# Patient Record
Sex: Female | Born: 1945 | Race: Black or African American | Hispanic: No | Marital: Married | Smoking: Former smoker
Health system: Southern US, Community
[De-identification: ages and names within clinical notes are randomized; demographics above are authoritative.]

## PROBLEM LIST (undated history)

## (undated) DIAGNOSIS — R0600 Dyspnea, unspecified: Secondary | ICD-10-CM

## (undated) DIAGNOSIS — M545 Low back pain, unspecified: Secondary | ICD-10-CM

## (undated) DIAGNOSIS — E119 Type 2 diabetes mellitus without complications: Secondary | ICD-10-CM

## (undated) DIAGNOSIS — Z8489 Family history of other specified conditions: Secondary | ICD-10-CM

## (undated) DIAGNOSIS — M1990 Unspecified osteoarthritis, unspecified site: Secondary | ICD-10-CM

## (undated) DIAGNOSIS — E785 Hyperlipidemia, unspecified: Secondary | ICD-10-CM

## (undated) DIAGNOSIS — I10 Essential (primary) hypertension: Secondary | ICD-10-CM

## (undated) DIAGNOSIS — M169 Osteoarthritis of hip, unspecified: Secondary | ICD-10-CM

## (undated) DIAGNOSIS — D219 Benign neoplasm of connective and other soft tissue, unspecified: Secondary | ICD-10-CM

## (undated) DIAGNOSIS — G8929 Other chronic pain: Secondary | ICD-10-CM

## (undated) DIAGNOSIS — K219 Gastro-esophageal reflux disease without esophagitis: Secondary | ICD-10-CM

## (undated) DIAGNOSIS — Z8619 Personal history of other infectious and parasitic diseases: Secondary | ICD-10-CM

## (undated) DIAGNOSIS — I499 Cardiac arrhythmia, unspecified: Secondary | ICD-10-CM

## (undated) DIAGNOSIS — F419 Anxiety disorder, unspecified: Secondary | ICD-10-CM

## (undated) DIAGNOSIS — Z973 Presence of spectacles and contact lenses: Secondary | ICD-10-CM

## (undated) DIAGNOSIS — M5410 Radiculopathy, site unspecified: Secondary | ICD-10-CM

## (undated) HISTORY — PX: INCISION AND DRAINAGE: SHX5863

## (undated) HISTORY — PX: COLONOSCOPY: SHX174

## (undated) HISTORY — DX: Personal history of other infectious and parasitic diseases: Z86.19

## (undated) HISTORY — PX: POSTERIOR CERVICAL LAMINECTOMY: SHX2248

## (undated) HISTORY — DX: Hyperlipidemia, unspecified: E78.5

## (undated) HISTORY — DX: Gastro-esophageal reflux disease without esophagitis: K21.9

## (undated) HISTORY — PX: BACK SURGERY: SHX140

## (undated) HISTORY — PX: TONSILLECTOMY: SUR1361

## (undated) HISTORY — PX: TUBAL LIGATION: SHX77

## (undated) HISTORY — PX: JOINT REPLACEMENT: SHX530

## (undated) HISTORY — DX: Benign neoplasm of connective and other soft tissue, unspecified: D21.9

## (undated) HISTORY — DX: Essential (primary) hypertension: I10

---

## 1968-11-23 HISTORY — PX: LAPAROSCOPIC OVARIAN CYSTECTOMY: SUR786

## 1986-11-23 HISTORY — PX: CRYOABLATION: SHX1415

## 1996-11-23 HISTORY — PX: CARDIAC CATHETERIZATION: SHX172

## 1996-11-23 HISTORY — PX: TOTAL ABDOMINAL HYSTERECTOMY: SHX209

## 1998-12-16 ENCOUNTER — Other Ambulatory Visit: Admission: RE | Admit: 1998-12-16 | Discharge: 1998-12-16 | Payer: Self-pay | Admitting: Obstetrics and Gynecology

## 2001-04-20 ENCOUNTER — Ambulatory Visit (HOSPITAL_COMMUNITY): Admission: RE | Admit: 2001-04-20 | Discharge: 2001-04-20 | Payer: Self-pay | Admitting: Internal Medicine

## 2001-04-20 ENCOUNTER — Encounter: Payer: Self-pay | Admitting: Internal Medicine

## 2001-08-03 ENCOUNTER — Other Ambulatory Visit: Admission: RE | Admit: 2001-08-03 | Discharge: 2001-08-03 | Payer: Self-pay | Admitting: Obstetrics and Gynecology

## 2003-02-01 ENCOUNTER — Other Ambulatory Visit: Admission: RE | Admit: 2003-02-01 | Discharge: 2003-02-01 | Payer: Self-pay | Admitting: Obstetrics and Gynecology

## 2003-02-22 HISTORY — PX: SHOULDER OPEN ROTATOR CUFF REPAIR: SHX2407

## 2003-02-27 ENCOUNTER — Ambulatory Visit (HOSPITAL_COMMUNITY): Admission: RE | Admit: 2003-02-27 | Discharge: 2003-02-27 | Payer: Self-pay | Admitting: Internal Medicine

## 2003-02-27 ENCOUNTER — Encounter: Payer: Self-pay | Admitting: Internal Medicine

## 2003-08-08 ENCOUNTER — Ambulatory Visit (HOSPITAL_COMMUNITY): Admission: RE | Admit: 2003-08-08 | Discharge: 2003-08-08 | Payer: Self-pay | Admitting: Internal Medicine

## 2003-08-08 ENCOUNTER — Encounter: Payer: Self-pay | Admitting: Internal Medicine

## 2003-10-17 ENCOUNTER — Ambulatory Visit (HOSPITAL_COMMUNITY): Admission: RE | Admit: 2003-10-17 | Discharge: 2003-10-17 | Payer: Self-pay | Admitting: Orthopaedic Surgery

## 2003-10-17 ENCOUNTER — Emergency Department (HOSPITAL_COMMUNITY): Admission: EM | Admit: 2003-10-17 | Discharge: 2003-10-17 | Payer: Self-pay | Admitting: *Deleted

## 2003-10-21 ENCOUNTER — Inpatient Hospital Stay (HOSPITAL_COMMUNITY): Admission: EM | Admit: 2003-10-21 | Discharge: 2003-10-30 | Payer: Self-pay | Admitting: Emergency Medicine

## 2003-10-24 ENCOUNTER — Encounter: Payer: Self-pay | Admitting: Cardiology

## 2004-02-14 ENCOUNTER — Other Ambulatory Visit: Admission: RE | Admit: 2004-02-14 | Discharge: 2004-02-14 | Payer: Self-pay | Admitting: Obstetrics and Gynecology

## 2005-02-17 ENCOUNTER — Other Ambulatory Visit: Admission: RE | Admit: 2005-02-17 | Discharge: 2005-02-17 | Payer: Self-pay | Admitting: Obstetrics and Gynecology

## 2005-08-06 ENCOUNTER — Ambulatory Visit: Payer: Self-pay | Admitting: Internal Medicine

## 2005-08-20 ENCOUNTER — Encounter (INDEPENDENT_AMBULATORY_CARE_PROVIDER_SITE_OTHER): Payer: Self-pay | Admitting: *Deleted

## 2005-08-20 ENCOUNTER — Ambulatory Visit: Payer: Self-pay | Admitting: Internal Medicine

## 2005-08-24 ENCOUNTER — Encounter: Admission: RE | Admit: 2005-08-24 | Discharge: 2005-11-22 | Payer: Self-pay | Admitting: Internal Medicine

## 2005-11-06 ENCOUNTER — Ambulatory Visit (HOSPITAL_COMMUNITY): Admission: RE | Admit: 2005-11-06 | Discharge: 2005-11-06 | Payer: Self-pay | Admitting: Internal Medicine

## 2006-02-19 ENCOUNTER — Other Ambulatory Visit: Admission: RE | Admit: 2006-02-19 | Discharge: 2006-02-19 | Payer: Self-pay | Admitting: Obstetrics and Gynecology

## 2006-05-20 ENCOUNTER — Emergency Department (HOSPITAL_COMMUNITY): Admission: EM | Admit: 2006-05-20 | Discharge: 2006-05-20 | Payer: Self-pay | Admitting: Emergency Medicine

## 2007-02-07 ENCOUNTER — Ambulatory Visit (HOSPITAL_COMMUNITY): Admission: RE | Admit: 2007-02-07 | Discharge: 2007-02-07 | Payer: Self-pay | Admitting: Internal Medicine

## 2007-02-21 ENCOUNTER — Other Ambulatory Visit: Admission: RE | Admit: 2007-02-21 | Discharge: 2007-02-21 | Payer: Self-pay | Admitting: Obstetrics and Gynecology

## 2008-03-05 ENCOUNTER — Other Ambulatory Visit: Admission: RE | Admit: 2008-03-05 | Discharge: 2008-03-05 | Payer: Self-pay | Admitting: Obstetrics and Gynecology

## 2008-11-23 HISTORY — PX: THUMB FUSION: SUR636

## 2009-03-06 ENCOUNTER — Ambulatory Visit: Payer: Self-pay | Admitting: Obstetrics and Gynecology

## 2009-03-06 ENCOUNTER — Other Ambulatory Visit: Admission: RE | Admit: 2009-03-06 | Discharge: 2009-03-06 | Payer: Self-pay | Admitting: Obstetrics and Gynecology

## 2009-03-06 ENCOUNTER — Encounter: Payer: Self-pay | Admitting: Obstetrics and Gynecology

## 2009-08-20 ENCOUNTER — Ambulatory Visit (HOSPITAL_BASED_OUTPATIENT_CLINIC_OR_DEPARTMENT_OTHER): Admission: RE | Admit: 2009-08-20 | Discharge: 2009-08-20 | Payer: Self-pay | Admitting: Orthopedic Surgery

## 2010-03-11 ENCOUNTER — Other Ambulatory Visit: Admission: RE | Admit: 2010-03-11 | Discharge: 2010-03-11 | Payer: Self-pay | Admitting: Obstetrics and Gynecology

## 2010-03-11 ENCOUNTER — Ambulatory Visit: Payer: Self-pay | Admitting: Obstetrics and Gynecology

## 2010-07-18 ENCOUNTER — Encounter: Payer: Self-pay | Admitting: Internal Medicine

## 2010-08-12 ENCOUNTER — Encounter (INDEPENDENT_AMBULATORY_CARE_PROVIDER_SITE_OTHER): Payer: Self-pay | Admitting: *Deleted

## 2010-09-12 ENCOUNTER — Encounter (INDEPENDENT_AMBULATORY_CARE_PROVIDER_SITE_OTHER): Payer: Self-pay | Admitting: *Deleted

## 2010-09-16 ENCOUNTER — Ambulatory Visit: Payer: Self-pay | Admitting: Internal Medicine

## 2010-09-16 ENCOUNTER — Encounter (INDEPENDENT_AMBULATORY_CARE_PROVIDER_SITE_OTHER): Payer: Self-pay | Admitting: *Deleted

## 2010-09-30 ENCOUNTER — Ambulatory Visit: Payer: Self-pay | Admitting: Internal Medicine

## 2010-10-02 ENCOUNTER — Encounter: Payer: Self-pay | Admitting: Internal Medicine

## 2010-12-25 NOTE — Letter (Signed)
Summary: Patient Notice- Polyp Results  [facility]  [address] [place], [place] [address]   Phone: [phone]  Fax: [phone]        October 02, 2010 MRN: [id number]    [name] [address] [place], [place]  [address]    Dear [name],  I am pleased to inform you that the colon polyp(s) removed during your recent colonoscopy was (were) found to be benign (no cancer detected) upon pathologic examination.  I recommend you have a repeat colonoscopy examination in 5 years to look for recurrent polyps, as having colon polyps increases your risk for having recurrent polyps or even colon cancer in the future.  Should you develop new or worsening symptoms of abdominal pain, bowel habit changes or bleeding from the rectum or bowels, please schedule an evaluation with either your primary care physician or with me.  Additional information/recommendations:  __ No further action with gastroenterology is needed at this time. Please      follow-up with your primary care physician for your other healthcare      needs.   Please call us if you are having persistent problems or have questions about your condition that have not been fully answered at this time.  Sincerely,  Hilarie Fredrickson MD  This letter has been electronically signed by your physician.  Appended Document: Patient Notice- Polyp Results letter mailed 11.15.2011

## 2010-12-25 NOTE — Letter (Signed)
Summary: Colonoscopy Letter  [facility]  [address] [place], [place] [address]   Phone: [phone]  Fax: [phone]      July 18, 2010 MRN: [id number]   [name] [address] [place], [place]  [address]   Dear [name],   According to your medical record, it is time for you to schedule a Colonoscopy. The American Cancer Society recommends this procedure as a method to detect early colon cancer. Patients with a family history of colon cancer, or a personal history of colon polyps or inflammatory bowel disease are at increased risk.  This letter has been generated based on the recommendations made at the time of your procedure. If you feel that in your particular situation this may no longer apply, please contact our office.  Please call our office at [phone] to schedule this appointment or to update your records at your earliest convenience.  Thank you for cooperating with [place] to provide you with the very best care possible.   Sincerely,  Wilhemina Bonito. Marina Goodell, M.D.  [facility] Gastroenterology Division [phone]

## 2010-12-25 NOTE — Miscellaneous (Signed)
Summary: [facility] Previsit/prep  Clinical Lists Changes  Medications: Added new medication of MOVIPREP 100 GM  SOLR (PEG-KCL-NACL-NASULF-NA ASC-C) As per prep instructions. - Signed Rx of MOVIPREP 100 GM  SOLR (PEG-KCL-NACL-NASULF-NA ASC-C) As per prep instructions.;  #1 x 0;  Signed;  Entered by: Wyona Almas RN;  Authorized by: Hilarie Fredrickson MD;  Method used: Electronically to [id number] [address] [place], [place]  [address], Ph: [phone], Fax: [phone] Allergies: Added new allergy or adverse reaction of PREDNISONE Observations: Added new observation of NKA: F (09/16/2010 8:01)    Prescriptions: MOVIPREP 100 GM  SOLR (PEG-KCL-NACL-NASULF-NA ASC-C) As per prep instructions.  #1 x 0   Entered by:   Wyona Almas RN   Authorized by:   Hilarie Fredrickson MD   Signed by:   Wyona Almas RN on 09/16/2010   Method used:   Electronically to        [facility] [id number] (retail)       [address] [place], [place]  [address]       Ph: [phone]       Fax: [phone]   RxID:   [id number]

## 2010-12-25 NOTE — Letter (Signed)
Summary: Diabetic Instructions  [facility]  [address] [place], [place] [address]   Phone: [phone]  Fax: [phone]    [name] 06/17/1946 MRN: [id number]   _x  _   ORAL DIABETIC MEDICATION INSTRUCTIONS  The day before your procedure:   Take your diabetic pill as you do normally  The day of your procedure:   Do not take your diabetic pill    We will check your blood sugar levels during the admission process and again in Recovery before discharging you home

## 2010-12-25 NOTE — Letter (Signed)
Summary: Pre Visit Letter Revised  [facility]  [address] [place], [place] [address]   Phone: [phone]  Fax: [phone]        08/12/2010 MRN: [id number] [name] [address] [place], [place]  [address]             Procedure Date:  09/30/2010   Welcome to the Gastroenterology Division at [facility].    You are scheduled to see a nurse for your pre-procedure visit on 09/16/2010 at 8:00AM on the 3rd floor at [facility], [address]  We ask that you try to arrive at our office 15 minutes prior to your appointment time to allow for check-in.  Please take a minute to review the attached form.  If you answer "Yes" to one or more of the questions on the first page, we ask that you call the person listed at your earliest opportunity.  If you answer "No" to all of the questions, please complete the rest of the form and bring it to your appointment.    Your nurse visit will consist of discussing your medical and surgical history, your immediate family medical history, and your medications.   If you are unable to list all of your medications on the form, please bring the medication bottles to your appointment and we will list them.  We will need to be aware of both prescribed and over the counter drugs.  We will need to know exact dosage information as well.    Please be prepared to read and sign documents such as consent forms, a financial agreement, and acknowledgement forms.  If necessary, and with your consent, a friend or relative is welcome to sit-in on the nurse visit with you.  Please bring your insurance card so that we may make a copy of it.  If your insurance requires a referral to see a specialist, please bring your referral form from your primary care physician.  No co-pay is required for this nurse visit.     If you cannot keep your appointment, please call [phone] to cancel or reschedule prior to your appointment date.  This  allows [place] the opportunity to schedule an appointment for another patient in need of care.    Thank you for choosing [facility] for your medical needs.  We appreciate the opportunity to care for you.  Please visit [place] at our website  to learn more about our practice.  Sincerely, The Gastroenterology Division

## 2010-12-25 NOTE — Procedures (Signed)
Summary: Colonoscopy  Patient: [name] Note: All result statuses are Final unless otherwise noted.  Tests: (1) Colonoscopy (COL)   COL Colonoscopy           DONE     [facility]     [address]     [place], [place]  [address]           COLONOSCOPY PROCEDURE REPORT           PATIENT:  [name], [name]  MR#:  [id number]     BIRTHDATE:  [birth year], 64 yrs. old  GENDER:  female     ENDOSCOPIST:  Wilhemina Bonito. Eda Keys, MD     REF. BY:  Surveillance Program Recall,     PROCEDURE DATE:  09/30/2010     PROCEDURE:  Colonoscopy with snare polypectomy x 5     ASA CLASS:  Class II     INDICATIONS:  history of pre-cancerous (adenomatous) colon polyps,     surveillance and high-risk screening ; index exam 07-2005 w/ small     TAs     MEDICATIONS:   Fentanyl 100 mcg IV, Versed 9 mg IV           DESCRIPTION OF PROCEDURE:   After the risks benefits and     alternatives of the procedure were thoroughly explained, informed     consent was obtained.  Digital rectal exam was performed and     revealed no abnormalities.   The LB CF-[id number] endoscope     was introduced through the anus and advanced to the cecum, which     was identified by both the appendix and ileocecal valve, without     limitations.Time to cecum = 1:58 min.  The quality of the prep was     excellent, using MoviPrep.  The instrument was then slowly     withdrawn (time = 21:14 min) as the colon was fully examined.     <<PROCEDUREIMAGES>>           FINDINGS:  There were five polyps identified and removed. All     measured <44mm and were located in the ascending colon (3),     descending colon and sigmoid colon. Polyps were snared without     cautery. Retrieval was successful in 4/5. This was otherwise a     normal examination of the colon.   Retroflexed views in the rectum     revealed no abnormalities.    The scope was then withdrawn from     the patient and the procedure completed.           COMPLICATIONS:  None           ENDOSCOPIC IMPRESSION:     1) Polyps, multiple small - removed     2) Otherwise normal examination           RECOMMENDATIONS:     1) Follow up colonoscopy in 5 years           ______________________________     Wilhemina Bonito. Eda Keys, MD           CC:  Thayer Headings, MD;  The Patient           n.     eSIGNED:   Wilhemina Bonito. Eda Keys at 09/30/2010 08:51 AM           Felecia Shelling, [id number]  Note: An exclamation mark (!) indicates a result that was not dispersed into the flowsheet. Document  Creation Date: 09/30/2010 8:51 AM _______________________________________________________________________  (1) Order result status: Final Collection or observation date-time: 09/30/2010 08:43 Requested date-time:  Receipt date-time:  Reported date-time:  Referring Physician:   Ordering Physician: Fransico Setters [phone]) Specimen Source:  Source: Launa Grill Order Number: [id number] Lab site:   Appended Document: Colonoscopy recall 5 yrs     Procedures Next Due Date:    Colonoscopy: 09/2015

## 2010-12-25 NOTE — Letter (Signed)
Summary: [facility] Instructions  [facility]  [address] [place], [place] [address]   Phone: [phone]  Fax: [phone]       [name]    63-Dec-1947    MRN: [id number]        Procedure Day /Date:   09/30/10 Tuesday     Arrival Time:  7:30am     Procedure Time: 8:00am     Location of Procedure:                    _ x_  [facility] (4th Floor)                        PREPARATION FOR COLONOSCOPY WITH MOVIPREP   Starting 5 days prior to your procedure _ 11/3/11_ do not eat nuts, seeds, popcorn, corn, beans, peas,  salads, or any raw vegetables.  Do not take any fiber supplements (e.g. Metamucil, Citrucel, and Benefiber).  THE DAY BEFORE YOUR PROCEDURE         DATE:  09/29/10   DAY:  Monday  1.  Drink clear liquids the entire day-NO SOLID FOOD  2.  Do not drink anything colored red or purple.  Avoid juices with pulp.  No orange juice.  3.  Drink at least 64 oz. (8 glasses) of fluid/clear liquids during the day to prevent dehydration and help the prep work efficiently.  CLEAR LIQUIDS INCLUDE: Water Jello Ice Popsicles Tea (sugar ok, no milk/cream) Powdered fruit flavored drinks Coffee (sugar ok, no milk/cream) Gatorade Juice: apple, white grape, white cranberry  Lemonade Clear bullion, consomm, broth Carbonated beverages (any kind) Strained chicken noodle soup Hard Candy                             4.  In the morning, mix first dose of MoviPrep solution:    Empty 1 Pouch A and 1 Pouch B into the disposable container    Add lukewarm drinking water to the top line of the container. Mix to dissolve    Refrigerate (mixed solution should be used within 24 hrs)  5.  Begin drinking the prep at 5:00 p.m. The MoviPrep container is divided by 4 marks.   Every 15 minutes drink the solution down to the next mark (approximately 8 oz) until the full liter is complete.   6.  Follow completed prep with 16 oz of clear liquid of your choice  (Nothing red or purple).  Continue to drink clear liquids until bedtime.  7.  Before going to bed, mix second dose of MoviPrep solution:    Empty 1 Pouch A and 1 Pouch B into the disposable container    Add lukewarm drinking water to the top line of the container. Mix to dissolve    Refrigerate  THE DAY OF YOUR PROCEDURE      DATE:   09/30/10  DAY:  Tuesday  Beginning at  3:00 a.m. (5 hours before procedure):         1. Every 15 minutes, drink the solution down to the next mark (approx 8 oz) until the full liter is complete.  2. Follow completed prep with 16 oz. of clear liquid of your choice.    3. You may drink clear liquids until 6:00am   (2 HOURS BEFORE PROCEDURE).   MEDICATION INSTRUCTIONS  Unless otherwise instructed, you should take regular prescription medications with a small sip  of water   as early as possible the morning of your procedure.  Diabetic patients - see separate instructions.        OTHER INSTRUCTIONS  You will need a responsible adult at least 63 years of age to accompany you and drive you home.   This person must remain in the waiting room during your procedure.  Wear loose fitting clothing that is easily removed.  Leave jewelry and other valuables at home.  However, you may wish to bring a book to read or  an iPod/MP3 player to listen to music as you wait for your procedure to start.  Remove all body piercing jewelry and leave at home.  Total time from sign-in until discharge is approximately 2-3 hours.  You should go home directly after your procedure and rest.  You can resume normal activities the  day after your procedure.  The day of your procedure you should not:   Drive   Make legal decisions   Operate machinery   Drink alcohol   Return to work  You will receive specific instructions about eating, activities and medications before you leave.    The above instructions have been reviewed and explained to me by   Wyona Almas RN  September 16, 2010 8:27 AM     I fully understand and can verbalize these instructions _____________________________ Date _________

## 2011-02-03 LAB — GLUCOSE, CAPILLARY
Glucose-Capillary: 103 mg/dL — ABNORMAL HIGH (ref 70–99)
Glucose-Capillary: 84 mg/dL (ref 70–99)

## 2011-02-27 LAB — GLUCOSE, CAPILLARY
Glucose-Capillary: 114 mg/dL — ABNORMAL HIGH (ref 70–99)
Glucose-Capillary: 145 mg/dL — ABNORMAL HIGH (ref 70–99)

## 2011-02-27 LAB — BASIC METABOLIC PANEL
BUN: 16 mg/dL (ref 6–23)
CO2: 30 mEq/L (ref 19–32)
Calcium: 10 mg/dL (ref 8.4–10.5)
Chloride: 102 mEq/L (ref 96–112)
Creatinine, Ser: 0.85 mg/dL (ref 0.4–1.2)
GFR calc Af Amer: >60 mL/min (ref >60)
GFR calc non Af Amer: >60 mL/min (ref >60)
Glucose, Bld: 134 mg/dL — ABNORMAL HIGH (ref 70–99)
Potassium: 5 mEq/L (ref 3.5–5.1)
Sodium: 139 mEq/L (ref 135–145)

## 2011-02-27 LAB — POCT HEMOGLOBIN-HEMACUE: Hemoglobin: 13.2 g/dL (ref 12.0–15.0)

## 2011-04-10 NOTE — Discharge Summary (Signed)
[name], [name]                          ACCOUNT NO.:  [id number]   MEDICAL RECORD NO.:  [id number]                   PATIENT TYPE:  INP   LOCATION:  5022                                 FACILITY:  [facility]   PHYSICIAN:  Elliot Cousin, M.D.                 DATE OF BIRTH:  1946-10-31   DATE OF ADMISSION:  10/21/2003  DATE OF DISCHARGE:  10/30/2003                                 DISCHARGE SUMMARY   DISCHARGE DIAGNOSES:  1. Bilateral shoulder pyarthrosis.     a. Status post right and left shoulder arthroscopy with debridement,        synovectomy, and lavage per Dr. Ophelia Charter on October 25, 2003.     b. Fluid culture positive for PMN, negative for organisms.  2. Status post right shoulder rotator cuff repair in April 2004, per Dr.     Ophelia Charter.     a. MRI of the right shoulder performed in November 2004, revealed        subacromial and subdeltoid bursitis.  3. Staph aureus bacteremia (methicillin-sensitive).  4. 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     a. MRI of the lumbar spine on October 17, 2003, revealed broad based        disk protrusion at L5-S1 and to the left with a L5 and left S1 nerve        root encroachment, disk protrusion at L4 to L5 into the right with        right L4 nerve root encroachment.  5. Transient abdominal pain.  6. Mildly elevated liver transaminases.  7. Microcytic anemia.  8. Hypertension.  9. Hyperlipidemia.  10.      Asthma.  11.      Status post hysterectomy in 1998.  12.      Status post C5 o C6 ACDF with left iliac crest bone graft in 1997.   DISCHARGE MEDICATIONS:  1. Ancef 1 g IV q.8h. for a total of six weeks.  2. Lipitor 40 mg daily.  3. Benicar 40 mg daily.  4. Tylox 5 mg one to two tablets every four to six hours as needed for pain.  5. Zyrtec D one every 12 hours.  6. Multivitamin with iron one daily.  7. Vitamin B12 supplement, vitamin B6 supplement, calcium with vitamin D as     previously taken.  8. Advair discus  250/50 one inhalation every 12 hours.  9. Albuterol MDI p.r.n.   DISCHARGE DISPOSITION:  [name] was discharged to home on October 30, 2003, in improved and stable condition.  She was advised to follow up with  Dr. Ophelia Charter in one to two weeks.  She is currently receiving home health  services from [facility] for the R.N. to administer Ancef  intravenously.   CONSULTATIONS:  1. Dr. Annell Greening.  2. Dr. Cliffton Asters.   PROCEDURES PERFORMED:  1. A  2-D echocardiogram on October 24, 2003.  The study was inadequate for     evaluation of left ventricular regional wall motion.  The study was     technically limited.  There were no obvious vegetations on the aortic     valve.  The mitral valve was not well seen.  Overall, left ventricular     function was within normal range.  2. MRI of the left shoulder on October 22, 2003.  The results revealed     irregular synovial enhancement in the left glenohumeral joint with     diffuse surrounding soft tissue edema highly suspicious for infection.     There are extra-articular fluid collections inferior to the axillary     recess which demonstrate peripheral enhancement and are suspicious for     small soft tissue abscesses.  No evidence of humeral head osteomyelitis.     Tear at the supraspinatus tendon with partial tearing of the     subscapularis tendon.  3. Status post right and left shoulder arthroscopy with debridement,     synovectomy, and lavage per Dr. Ophelia Charter on October 25, 2003.  4. Ultrasound of the abdomen on October 22, 2003.  The results revealed a     simple right renal cyst and no evidence of acute cholecystitis.  5. Status post right upper extremity PICC per radiology on October 30, 2003.   HISTORY OF PRESENT ILLNESS:  [name] is a 65 year old lady with a past  medical history significant for chronic low back pain, bilateral shoulder  pain, hypertension, and hyperlipidemia who presented to the hospital on  October 21, 2003, with a chief complaint of low back pain and a secondary  complaint of left greater than right shoulder pain.  When the patient was  evaluated initially she was febrile with a temperature of 102.6.  The  patient had been seen and evaluated by Dr. Ophelia Charter in the office a week prior  secondary to low back pain.  Dr. Ophelia Charter ordered a MRI of the lumbar spine  which revealed broad based disk protrusion at L5 and S1 and to the left, and  L5-S1 nerve root encroachment.  The patient has also been having more  shoulder pain, left greater than right;  however, the patient is status post  right shoulder rotator cuff repair in April 2004.  The MRI of the right  shoulder was performed several weeks ago and it showed subacromial and  subdeltoid bursitis.  The patient was given Cortisone injections x2 in the  right shoulder.  The left shoulder pain has been getting progressively worse  over the past few days.  The patient was admitted for evaluation of low back  pain, shoulder pain, and fever.   HOSPITAL COURSE:  #1 -  METHICILLIN-SENSITIVE STAPHYLOCOCCUS AUREUS  BACTEREMIA FELT TO BE SECONDARY TO BILATERAL SHOULDER INFECTION/SEPTIC  ARTHRITIS:  The patient presented with back pain, bilateral shoulder pain,  and fever.  There was some concern regarding a low back pain infection,  specifically diskitis or osteomyelitis, concern regarding bilateral shoulder  infections, and concern regarding an intra-abdominal infection given that  the patient had some vague complaints of abdominal pain and also that her  liver transaminases were elevated.  The initial evaluation included  obtaining blood cultures, obtaining a viral hepatitis panel to rule out  acute hepatitis, obtaining an ultrasound of the abdomen to rule out acute  cholecystitis, and obtaining an orthopaedic consultation with Dr. Ophelia Charter. The patient's white blood cell count  was initially elevated at 17.9.  Her  sed rate was elevated at 124.  Her  liver function tests were mildly elevated  with a SGOT of 44, SGPT of 63, and an alkaline phosphatase of 249.  The  total bilirubin was within normal limits at 0.9.  Per the lab results, the  patient's viral hepatitis panel was negative with the exception of evidence  of a prior hepatitis A infection (the hepatitis A antibody was positive).  The ultrasound of the abdomen was essentially negative.  There was no  evidence of acute cholecystitis.  The patient's urine was also obtained and  cultured.  The urinalysis revealed positive nitrites and rare bacteria.  The  urine culture was essentially negative.  The patient's chest x-ray revealed  no acute disease.  The patient was treated empirically with Rocephin and  azithromycin on admission to cover a broad spectrum of potential pathogens.   The blood cultures became positive on hospital day #3, with Staph aureus.  The Staph aureus was methicillin-resistant.  Dr. Ophelia Charter ordered a MRI of the  left shoulder per his evaluation.  The MRI of the left shoulder revealed  changes that were worrisome for infection and abscess.  The MRI specifically  revealed diffuse soft tissue edema and abnormal enhancement around the left  glenohumeral joint.  There was no evidence of humeral head or glenoid  osteomyelitis.  An infectious disease consult was obtained given the  evidence of a bacteremia and a possible left shoulder abscess.  Dr. Roxan Hockey  provided the initial infectious disease consultation followed by Dr.  Orvan Falconer.  The antibiotics were eventually changed to vancomycin prior to  the sensitivities being evident.  Once the sensitivities revealed that the  Staph infection was sensitive to methicillin, the vancomycin, Rocephin, and  azithromycin were discontinued and the patient was treated with Ancef 2 g IV  every eight hours.  Dr. Ophelia Charter aspirated the left shoulder, and a Gram stain  as well as cultures were ordered from the aspirate.  The aspirate Gram  stain  revealed a moderate amount of PMNs, however, the culture and the Gram stain  was negative for organisms.  Dr. Orvan Falconer felt that the fluid cultures were  negative because the patient had been treated with antibiotics prior to the  aspiration.   Dr. Ophelia Charter, per his assessment and evaluation, decided to treat the patient  surgically with bilateral arthroscopy, with debridement, synovectomy, and  lavage.  This operation was performed on October 25, 2003.  There were no  complications.  The patient tolerated the surgery well.  Following the  arthroscopy, synovectomy, and lavage, the patient's white blood cell count  began to decrease.  She defervesced with regards to her fever during the  initial two to three days of hospitalization.  Following the right and left  shoulder arthroscopies, the patient received postoperative wound care.  Per Dr. Blair Dolphin recommendations, the patient will need antibiotic treatment  for six weeks with intravenous Ancef.  Dr. Orvan Falconer felt that the patient  more than likely had methicillin Staph bacteremia as a result of feeding  from the bilateral shoulder infections.  The cultures were falsely negative  secondary to preoperative antibiotics.  For the outpatient antibiotic  therapy, the IV team was consulted to inserted a PICC line.  The PICC was  inserted on October 30, 2003, prior to hospital discharge.  Home health  services were consulted for outpatient administration of Ancef and for  postoperative wound care of the shoulders.  The patient will follow up with  Dr. Ophelia Charter in one to two weeks.  She was advised to not return to work until  re-evaluated by Dr. Ophelia Charter.  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  The patient did still have  some discomfort with her shoulders bilaterally;  however, the discomfort was  well treated with  as needed Tylox.  The patient was stable and in improved  condition prior to hospital discharge.  #2 -  ACUTE ON CHRONIC LOW BACK PAIN THOUGHT TO BE SECONDARY TO L5 TO S1  RADICULOPATHY:  As stated above, the patient had been evaluated by Dr. Ophelia Charter  in the outpatient setting for L5 to S1 radiculopathy secondary to disk  protrusion.  The patient was treated conservatively during the hospital  course.  Per Dr. Ophelia Charter assessment, the patient would most likely require a  L5 to S1 left microdiskectomy.  This will be performed at a later date once  the patient's bilateral shoulder infections resolve.  #3 -  ELEVATED LIVER TRANSAMINASES:  The patient had some transient  abdominal pain during the hospital course.  This resolved.  However, in the  setting of mildly elevated liver transaminases, an ultrasound of the abdomen  as well as a viral hepatitis panel were ordered.  The ultrasound of the  abdomen was essentially negative.  The hepatitis A antibody was positive;  however, the hepatitis B core antibody and the hepatitis C antibody were  negative.  The hepatitis A antibody was not differentiated between the IgM  or the IgG.  The patient had no overt signs of an acute hepatitis A  infection.  The patient's LFTs were mildly elevated with an AST ranging  between 44 and 47, and the ALT ranging between 53 and 71.  This actually may  be the result of statin therapy with Lipitor.  The Lipitor was held during  the hospital course.  It will need to be restarted in the outpatient setting  per her primary care physician.  #4 -  MICROCYTIC ANEMIA:  The patient's hemoglobin was 12.6 on admission;  however, during the hospital course and postoperatively it fell to a nadir  of 9.3.  It improved to 10 prior to hospital discharge.  The patient's MCV  ranged between 79.9 and 80.7.  Her stool was guaiac negative during the hospitalization.  The most likely cause of the patient's anemia was blood  loss from the  arthroscopy.  However, the patient will probably need an  outpatient GI evaluation to rule out colon abnormalities.  She was advised  to take a multivitamin with iron daily.                                                Elliot Cousin, M.D.    DF/MEDQ  D:  11/03/2003  T:  11/04/2003  Job:  [id number]   cc:   Veverly Fells. Ophelia Charter, M.D.  [address] [place], [place] [address]  Fax: [phone]

## 2011-04-10 NOTE — Op Note (Signed)
[name], [name]                          ACCOUNT NO.:  [id number]   MEDICAL RECORD NO.:  [id number]                   PATIENT TYPE:  INP   LOCATION:  5022                                 FACILITY:  [facility]   PHYSICIAN:  [name], M.D.                 DATE OF BIRTH:  1946-08-06   DATE OF PROCEDURE:  10/25/2003  DATE OF DISCHARGE:                                 OPERATIVE REPORT   PREOPERATIVE DIAGNOSIS:  Bilateral shoulder pyarthrosis.   POSTOPERATIVE DIAGNOSIS:  Bilateral shoulder pyarthrosis.   PROCEDURE:  Right and left shoulder arthroscopy with debridement,  synovectomy and lavage.   SURGEON:  [name], M.D.   ANESTHESIA:  GOT.   BRIEF HISTORY:  This 65 year old female was admitted to the medical service  with Staph aureus bacteremia.  She has been having some shoulder pain as  well as back pain.  A previous MRI of her back showed no evidence of  infection. She had a rotator cuff repair by myself in April 2004.  Her left  shoulder was the most painful and MRI showed some fluid in the joint as well  as abnormal fluid through the muscle, and a small area, 2 cm x 1 cm,  anterior and inferior to the shoulder, extra-articular, with abnormal fluid  collection.  The patient was taken to the operating room at this time for  bilateral shoulder arthroscopies, after aspiration of her left shoulder  earlier today revealed purulent material which was loaded with wbc's but  negative Gram stain.  The patient has been on IV antibiotics for a few days.   DESCRIPTION OF PROCEDURE:  After general anesthesia, continuing her normal  antibiotic region, beach chair position, usual arthroscopic sheets and  drapes were applied, first the left shoulder, and after the left shoulder  was completed, to the right shoulder. The arthroscopic patch was occluded,  impervious stockinette Coban.  Arthroscope was introduced from a posterior  approach, and initially, scope was introduced, and about  10 cc of cloudy  fluid about the color of tea which would be iced tea was encountered.   Arthroscope was introduced, joint was insufflated, anterior portal was made  in through the biceps tendon and cannula was introduced.  A 4.2 Kuda shaver  was used to debride some mild, fibrous tissue and some synovial tissue  present in the joint.  Partial synovectomy was performed, with care taken  not to debride the anteroinferior glenohumeral ligament.  Labrum was intact.  There was no cartilage damage.  Undersurface of the rotator cuff showed some  mild fraying and undersurface tearing of the supraspinatus on the joint  side.  After lavage of 3 L and further debridement with conclusion of the  partial synovectomy, switching the cannula and scope for debridement,  Hemovac was placed through the anterior portal.  Using the cannula, simple  sutures placed and postop dressing.  An identical procedure was performed on the right shoulder.  Right shoulder  arthroscopic findings showed that there was significant amounts of some  fibrous tissue present with some adhesions, which may be related to the  previous surgery back in April.  There was some synovitis, not as severe on  the left shoulder. Synovectomy was performed with debridement, extensive  lavage.  Undersurface of the rotator cuff showed some minimal fraying but no  definite  tear was visualized. This was consistent with the previous cannula that she  had, showing only partial tearing and satisfactory repair.  The long head of  the biceps tendon was normal as well.  Hemovac drain was placed through the  anterior portal as well. The patient was transferred to the recovery room  after instruments were removed.                                               [name], M.D.    MCY/MEDQ  D:  10/25/2003  T:  10/26/2003  Job:  [id number]
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
## 2011-06-05 ENCOUNTER — Encounter (INDEPENDENT_AMBULATORY_CARE_PROVIDER_SITE_OTHER): Payer: Managed Care, Other (non HMO) | Admitting: Obstetrics and Gynecology

## 2011-06-05 ENCOUNTER — Other Ambulatory Visit (HOSPITAL_COMMUNITY)
Admission: RE | Admit: 2011-06-05 | Discharge: 2011-06-05 | Disposition: A | Discharge status: Home / Self Care | Payer: Managed Care, Other (non HMO) | Source: Ambulatory Visit | Attending: Obstetrics and Gynecology | Admitting: Obstetrics and Gynecology

## 2011-06-05 ENCOUNTER — Other Ambulatory Visit: Payer: Self-pay | Admitting: Obstetrics and Gynecology

## 2011-06-05 DIAGNOSIS — Z01419 Encounter for gynecological examination (general) (routine) without abnormal findings: Secondary | ICD-10-CM

## 2011-06-05 DIAGNOSIS — Z124 Encounter for screening for malignant neoplasm of cervix: Secondary | ICD-10-CM | POA: Insufficient documentation

## 2011-06-05 DIAGNOSIS — R82998 Other abnormal findings in urine: Secondary | ICD-10-CM

## 2011-06-25 ENCOUNTER — Other Ambulatory Visit: Payer: Managed Care, Other (non HMO)

## 2011-07-06 ENCOUNTER — Other Ambulatory Visit: Payer: Managed Care, Other (non HMO)

## 2012-10-04 ENCOUNTER — Other Ambulatory Visit: Payer: Self-pay | Admitting: Cardiology

## 2012-10-04 DIAGNOSIS — I739 Peripheral vascular disease, unspecified: Secondary | ICD-10-CM

## 2012-10-06 ENCOUNTER — Encounter (INDEPENDENT_AMBULATORY_CARE_PROVIDER_SITE_OTHER): Payer: Managed Care, Other (non HMO)

## 2012-10-06 DIAGNOSIS — I739 Peripheral vascular disease, unspecified: Secondary | ICD-10-CM

## 2012-10-06 DIAGNOSIS — I70219 Atherosclerosis of native arteries of extremities with intermittent claudication, unspecified extremity: Secondary | ICD-10-CM

## 2012-10-06 DIAGNOSIS — E1159 Type 2 diabetes mellitus with other circulatory complications: Secondary | ICD-10-CM

## 2012-12-19 ENCOUNTER — Encounter: Payer: Self-pay | Admitting: Physician Assistant

## 2012-12-19 ENCOUNTER — Ambulatory Visit (INDEPENDENT_AMBULATORY_CARE_PROVIDER_SITE_OTHER): Payer: Managed Care, Other (non HMO) | Admitting: Physician Assistant

## 2012-12-19 DIAGNOSIS — R079 Chest pain, unspecified: Secondary | ICD-10-CM

## 2012-12-19 NOTE — Progress Notes (Signed)
[name] is a 67 y.o. female referred by PCP for ETT b/c of chest pain.  She has a hx of remote cardiac cath many years ago (age51) reportedly normal.  PMH:  DM2, HTN, HL.  SHx Ex smoker.  FHx neg for CAD.  Notes occasional chest pain.  Not exertional.  Left sided.  She has DOE with more extreme activities.  No syncope.  Exam unremarkable.  Exercise Treadmill Test  Pre-Exercise Testing Evaluation Rhythm: normal sinus  Rate: 83                Test  Exercise Tolerance Test Ordering MD: Thayer Headings  Interpreting MD: Tereso Newcomer, PA-C  Unique Test No: 1  Treadmill:  1  Indication for ETT: chest pain - rule out ischemia  Contraindication to ETT: No   Stress Modality: exercise - treadmill  Cardiac Imaging Performed: non   Protocol: standard Bruce - maximal  Max BP:  198/79  Max MPHR (bpm):  156 85% MPR (bpm):  133  MPHR obtained (bpm):  148 % MPHR obtained:  94%  Reached 85% MPHR (min:sec):  2:22 Total Exercise Time (min-sec):  3:57  Workload in METS:  5.7 Borg Scale: 14  Reason ETT Terminated:  fatigue    ST Segment Analysis At Rest: normal ST segments - no evidence of significant ST depression With Exercise: non-specific ST changes  Other Information Arrhythmia:  No Angina during ETT:  present (1) Quality of ETT:  diagnostic  ETT Interpretation:  normal - no evidence of ischemia by ST analysis  Comments: Poor exercise tolerance. She did note post-exercise chest pain. Normal BP response to exercise. No significant ST-T changes to suggest ischemia.   Recommendations: With patient who has diabetes (risk equivalent) would consider pharmacologic nuclear study if symptoms continue [facility]) for greater sensitivity/specificity. Luna Glasgow, PA-C  11:21 AM 12/19/2012

## 2013-10-27 ENCOUNTER — Telehealth: Payer: Self-pay | Admitting: *Deleted

## 2013-10-27 NOTE — Telephone Encounter (Signed)
SCHED PT AN APPT FOR 10-31-13 AT 9:15 AM

## 2013-10-27 NOTE — Telephone Encounter (Signed)
Pt states her hammer toe from 01/2013 surgery is red.  I transferred to scheduler.

## 2013-10-31 ENCOUNTER — Ambulatory Visit (INDEPENDENT_AMBULATORY_CARE_PROVIDER_SITE_OTHER): Payer: Medicare Other

## 2013-10-31 VITALS — BP 124/79 | HR 98 | Resp 12

## 2013-10-31 DIAGNOSIS — Q828 Other specified congenital malformations of skin: Secondary | ICD-10-CM

## 2013-10-31 DIAGNOSIS — R52 Pain, unspecified: Secondary | ICD-10-CM

## 2013-10-31 DIAGNOSIS — E1149 Type 2 diabetes mellitus with other diabetic neurological complication: Secondary | ICD-10-CM

## 2013-10-31 DIAGNOSIS — M2042 Other hammer toe(s) (acquired), left foot: Secondary | ICD-10-CM

## 2013-10-31 DIAGNOSIS — E1140 Type 2 diabetes mellitus with diabetic neuropathy, unspecified: Secondary | ICD-10-CM

## 2013-10-31 DIAGNOSIS — E1142 Type 2 diabetes mellitus with diabetic polyneuropathy: Secondary | ICD-10-CM

## 2013-10-31 NOTE — Patient Instructions (Signed)
Corns and Calluses Corns are small areas of thickened skin that usually occur on the top, sides, or tip of a toe. They contain a cone-shaped core with a point that can press on a nerve below. This causes pain. Calluses are areas of thickened skin that usually develop on hands, fingers, palms, soles of the feet, and heels. These are areas that experience frequent friction or pressure. CAUSES  Corns are usually the result of rubbing (friction) or pressure from shoes that are too tight or do not fit properly. Calluses are caused by repeated friction and pressure on the affected areas. SYMPTOMS  A hard growth on the skin.  Pain or tenderness under the skin.  Sometimes, redness and swelling.  Increased discomfort while wearing tight-fitting shoes. DIAGNOSIS  Your caregiver can usually tell what the problem is by doing a physical exam. TREATMENT  Removing the cause of the friction or pressure is usually the only treatment needed. However, sometimes medicines can be used to help soften the hardened, thickened areas. These medicines include salicylic acid plasters and 12% ammonium lactate lotion. These medicines should only be used under the direction of your caregiver. HOME CARE INSTRUCTIONS   Try to remove pressure from the affected area.  You may wear donut-shaped corn pads to protect your skin.  You may use a pumice stone or nonmetallic nail file to gently reduce the thickness of a corn.  Wear properly fitted footwear.  If you have calluses on the hands, wear gloves during activities that cause friction.  If you have diabetes, you should regularly examine your feet. Tell your caregiver if you notice any problems with your feet. SEEK IMMEDIATE MEDICAL CARE IF:   You have increased pain, swelling, redness, or warmth in the affected area.  Your corn or callus starts to drain fluid or bleeds.  You are not getting better, even with treatment. Document Released: 08/15/2004 Document  Revised: 02/01/2012 Document Reviewed: 07/07/2011 [facility] Patient Information 2014 [place], [place].  On exam today was determined that your shoes were too short at least one size. You need to go up one size and shoes in either wide or medium width.

## 2013-10-31 NOTE — Progress Notes (Signed)
   Subjective:    Patient ID: [name], female    DOB: 1946-02-06, 67 y.o.   MRN: [id number]  HPI Comments: ''  THE SIDE OF THE LT FOOT IS A LITTLE SORE''  Foot Pain This is a new problem. The current episode started 1 to 4 weeks ago. The problem occurs every several days. The problem has been unchanged. Associated symptoms include coughing. The symptoms are aggravated by standing and walking. Treatments tried: ORTHOTICS. The treatment provided no relief.      Review of Systems  Eyes: Positive for visual disturbance.  Respiratory: Positive for cough, chest tightness, shortness of breath and wheezing.   All other systems reviewed and are negative.       Objective:   Physical Exam Neurovascular status is intact with pedal pulses palpable DP and PT +2/4. Patient does have a digital contractures hammertoe deformity fifth digit left foot with HD 5 left patient is wearing shoes which and examined this time are 2 short her toes are getting all that that's the end of the shoe box. Patient has had previous surgery hammertoe repair as well as [place] bunionectomy left foot earlier this year however that area is healed well the incisions are well coapted assistance of a keratoses of the lateral fourth digit left foot. With residual hammertoe deformity. The fifth digit does have keratoses due to hitting the rubbing against the end of the shoes otherwise no other new problems digital contractures noted with mild arthropathy consistent with patient's age no signs of fracture or other osseous abnormalities noted clinically or radiographically.       Assessment & Plan:  Assessment this time porokeratosis/Digital contracture and irritation from shoes are too short and a properly sized. Patient is has residual arthrosis and hammertoe contracture fifth and fourth digit left foot. The lesion of the fourth digit debridement and 50 keratoses debrided tubercle padding and is dispensed for use in a Coflex  wrap is also recommended to maintain compression edema. Return for followup on an as-needed basis however stressed that she is to get larger shoes we'll release once ice or more as instructed  Alvan Dame DPM

## 2013-11-23 HISTORY — PX: CATARACT EXTRACTION W/ INTRAOCULAR LENS  IMPLANT, BILATERAL: SHX1307

## 2013-11-28 ENCOUNTER — Ambulatory Visit: Payer: Medicare Other

## 2014-04-18 ENCOUNTER — Ambulatory Visit: Payer: Medicare Other

## 2014-04-19 ENCOUNTER — Encounter (INDEPENDENT_AMBULATORY_CARE_PROVIDER_SITE_OTHER): Payer: Commercial Managed Care - HMO | Admitting: Ophthalmology

## 2014-04-19 DIAGNOSIS — I10 Essential (primary) hypertension: Secondary | ICD-10-CM

## 2014-04-19 DIAGNOSIS — H35039 Hypertensive retinopathy, unspecified eye: Secondary | ICD-10-CM

## 2014-04-19 DIAGNOSIS — E11319 Type 2 diabetes mellitus with unspecified diabetic retinopathy without macular edema: Secondary | ICD-10-CM

## 2014-04-19 DIAGNOSIS — E1139 Type 2 diabetes mellitus with other diabetic ophthalmic complication: Secondary | ICD-10-CM

## 2014-04-19 DIAGNOSIS — H43819 Vitreous degeneration, unspecified eye: Secondary | ICD-10-CM

## 2014-04-19 DIAGNOSIS — H2510 Age-related nuclear cataract, unspecified eye: Secondary | ICD-10-CM

## 2014-04-19 DIAGNOSIS — E1165 Type 2 diabetes mellitus with hyperglycemia: Secondary | ICD-10-CM

## 2014-04-23 ENCOUNTER — Ambulatory Visit (INDEPENDENT_AMBULATORY_CARE_PROVIDER_SITE_OTHER): Payer: Commercial Managed Care - HMO

## 2014-04-23 VITALS — BP 115/62 | HR 97 | Resp 16 | Ht <= 58 in | Wt 162.0 lb

## 2014-04-23 DIAGNOSIS — E1140 Type 2 diabetes mellitus with diabetic neuropathy, unspecified: Secondary | ICD-10-CM

## 2014-04-23 DIAGNOSIS — R52 Pain, unspecified: Secondary | ICD-10-CM

## 2014-04-23 DIAGNOSIS — Q828 Other specified congenital malformations of skin: Secondary | ICD-10-CM

## 2014-04-23 DIAGNOSIS — E1149 Type 2 diabetes mellitus with other diabetic neurological complication: Secondary | ICD-10-CM

## 2014-04-23 DIAGNOSIS — E1142 Type 2 diabetes mellitus with diabetic polyneuropathy: Secondary | ICD-10-CM

## 2014-04-23 DIAGNOSIS — L910 Hypertrophic scar: Secondary | ICD-10-CM

## 2014-04-23 NOTE — Patient Instructions (Signed)
ANTIBACTERIAL SOAP INSTRUCTIONS  THE DAY AFTER PROCEDURE  Please follow the instructions your doctor has marked.   Shower as usual. Before getting out, place a drop of antibacterial liquid soap (Dial) on a wet, clean washcloth.  Gently wipe washcloth over affected area.  Afterward, rinse the area with warm water.  Blot the area dry with a soft cloth and cover with antibiotic ointment (neosporin, polysporin, bacitracin) and band aid or gauze and tape  Place 3-4 drops of antibacterial liquid soap in a quart of warm tap water.  Submerge foot into water for 20 minutes.  If bandage was applied after your procedure, leave on to allow for easy lift off, then remove and continue with soak for the remaining time.  Next, blot area dry with a soft cloth and cover with a bandage.  Apply other medications as directed by your doctor, such as cortisporin otic solution (eardrops) or neosporin antibiotic ointment  Leave the Medicaid van time is placed in the office for 24 hours. Then remove wash thoroughly dry and apply any lotion or cream or cocoa butter to the hypertrophic scar area followup in the future as needed in the scar recur

## 2014-04-23 NOTE — Progress Notes (Signed)
   Subjective:    Patient ID: [name], female    DOB: 28-Feb-1946, 68 y.o.   MRN: [id number]  HPI Comments: Pt states she now wears larger shoes and still has pain in the left 5th toe.  Pt's toe has hard skin along the suture line.     Review of Systems no new findings or systemic changes are noted     Objective:   Physical Exam Neurovascular status is intact with pedal pulses palpable DP and PT +2/4 bilateral capillary refill time 3 seconds neurologic epicritic and proprioceptive sensations intact and symmetric normal plantar response DTRs are listed there is keratotic lesion the lateral fifth digit left foot were patient hammertoe deformities were corrected 1 year ago x-rays taken this time consolidation of the osteotomy the first metatarsal as well as adequate resection of bone the fifth digit with hammertoe repair. However clinically there is hypertrophy of skin with a nucleated keratotic lesion either verrucoid a poor keratosis of the fifth digit left foot. This lesion at this time is debrided pack was soaks to 05% salicylic acid and occlusion for 24 hours patient will initiate topical treatment in the future as needed maintain clean cream or lotion such as cocoa butter or Eucerin cream or something similar to the area daily return for future followup on an as-needed basis if scar tissue recurs       Assessment & Plan:  Assessment diabetes with history peripheral neuropathy there is capacious status post bunion repair hammertoe repair has residual hypertrophic scar tissue versus verrucoid lesion fifth digit left foot the lesion is debrided and packed with 39% salicylic acid we'll recheck in one month for followup if he continues she continues any problems or difficulties this appears to be hypertrophic scar tissue within the scar itself which on debridement provide him [place] immediately to the patient 1960% Sal-Acid and dressing for 24-hour is instructed in clean and apply lotion daily as  instructed  Harriet Masson DPM

## 2014-05-23 ENCOUNTER — Ambulatory Visit: Payer: Commercial Managed Care - HMO

## 2014-06-13 ENCOUNTER — Ambulatory Visit: Payer: Commercial Managed Care - HMO

## 2014-06-19 ENCOUNTER — Ambulatory Visit: Payer: Commercial Managed Care - HMO

## 2014-06-27 ENCOUNTER — Ambulatory Visit (INDEPENDENT_AMBULATORY_CARE_PROVIDER_SITE_OTHER): Payer: Commercial Managed Care - HMO

## 2014-06-27 VITALS — BP 122/78 | HR 74 | Resp 17

## 2014-06-27 DIAGNOSIS — M19071 Primary osteoarthritis, right ankle and foot: Secondary | ICD-10-CM

## 2014-06-27 DIAGNOSIS — E1149 Type 2 diabetes mellitus with other diabetic neurological complication: Secondary | ICD-10-CM

## 2014-06-27 DIAGNOSIS — E1140 Type 2 diabetes mellitus with diabetic neuropathy, unspecified: Secondary | ICD-10-CM

## 2014-06-27 DIAGNOSIS — M7751 Other enthesopathy of right foot: Secondary | ICD-10-CM

## 2014-06-27 DIAGNOSIS — M19079 Primary osteoarthritis, unspecified ankle and foot: Secondary | ICD-10-CM

## 2014-06-27 DIAGNOSIS — S93401A Sprain of unspecified ligament of right ankle, initial encounter: Secondary | ICD-10-CM

## 2014-06-27 DIAGNOSIS — E1142 Type 2 diabetes mellitus with diabetic polyneuropathy: Secondary | ICD-10-CM

## 2014-06-27 DIAGNOSIS — M722 Plantar fascial fibromatosis: Secondary | ICD-10-CM

## 2014-06-27 DIAGNOSIS — S93409A Sprain of unspecified ligament of unspecified ankle, initial encounter: Secondary | ICD-10-CM

## 2014-06-27 DIAGNOSIS — M7750 Other enthesopathy of unspecified foot: Secondary | ICD-10-CM

## 2014-06-27 NOTE — Patient Instructions (Signed)

## 2014-06-27 NOTE — Progress Notes (Signed)
   Subjective:    Patient ID: [name], female    DOB: March 16, 1946, 68 y.o.   MRN: [id number]  HPI Pt presents with recheck left 5th met, toe is doing well. New problem, pt presents with right arch/inner ankle pain progressively worsened with swelling for several months. Stiff and uncomfortable to walk.   Review of Systems no new findings or systemic changes noted     Objective:   Physical Exam 68 year old female presents this time well-developed well-nourished oriented x3 her left foot in a keratoses and fifth toe of them well as no longer an issue however has a new problem has swelling the medial ankle medial malleolar area right foot over the last couple days. Has had an injury that occurred her foot recently the last week however has had pain for as much as 2 months on and off. There is pain in the medial arch medial band of the plantar fascia her most significant of the talonavicular articulation insertion of the posterior tibial tendon site. There is also some edema and fluctuance along the anterior medial malleolar area.  Lower extremity objective findings unchanged pedal pulses are palpable DP and PT posterior were for Refill 3 seconds. Orthopedic biomechanical exam otherwise unremarkable no new x-rays taken at this time mild digital contractures are noted foot is rectus allergic to 3 changes on the right foot with tenderness along the plantar fascia and medial ankle area a medial arch right. No open wounds ulcerations. Patient been placed on the left and by her rheumatologist earlier today.       Assessment & Plan:  Assessment plantar fasciitis with possible medial ankle sprain and posterior tibial tendinitis insertional. Mild ankle capsulitis arthropathy noted as well with edema being present at this time and ankle stabilizer supply the patient is instructed in application of ankle stabilizer. Reappointed 4 weeks for followup maintain the [facility] as needed also recommended ice pack  to the area at good stable shoe at all times patient had tried over-the-counter orthotics the good foot orthotics were to harden step the Dr. Felicie Morn insoles she is now or in have no support just playing cushioning may be candidate for custom orthoses for a semirigid orthotic in the future. Reevaluate in one month  Harriet Masson DPM

## 2014-07-24 ENCOUNTER — Ambulatory Visit: Payer: Commercial Managed Care - HMO

## 2014-08-07 ENCOUNTER — Ambulatory Visit: Payer: Medicare HMO | Attending: Rheumatology | Admitting: Physical Therapy

## 2014-08-07 DIAGNOSIS — M25559 Pain in unspecified hip: Secondary | ICD-10-CM | POA: Insufficient documentation

## 2014-08-07 DIAGNOSIS — M545 Low back pain, unspecified: Secondary | ICD-10-CM | POA: Diagnosis not present

## 2014-08-07 DIAGNOSIS — M6281 Muscle weakness (generalized): Secondary | ICD-10-CM | POA: Diagnosis not present

## 2014-08-07 DIAGNOSIS — IMO0001 Reserved for inherently not codable concepts without codable children: Secondary | ICD-10-CM | POA: Insufficient documentation

## 2014-08-09 ENCOUNTER — Ambulatory Visit: Payer: Medicare HMO | Admitting: Rehabilitation

## 2014-08-09 DIAGNOSIS — IMO0001 Reserved for inherently not codable concepts without codable children: Secondary | ICD-10-CM | POA: Diagnosis not present

## 2014-08-14 ENCOUNTER — Ambulatory Visit: Payer: Medicare HMO | Admitting: Rehabilitation

## 2014-08-14 DIAGNOSIS — IMO0001 Reserved for inherently not codable concepts without codable children: Secondary | ICD-10-CM | POA: Diagnosis not present

## 2014-08-16 ENCOUNTER — Ambulatory Visit: Payer: Medicare HMO | Admitting: Rehabilitation

## 2014-08-16 DIAGNOSIS — IMO0001 Reserved for inherently not codable concepts without codable children: Secondary | ICD-10-CM | POA: Diagnosis not present

## 2014-08-20 ENCOUNTER — Ambulatory Visit: Payer: Medicare HMO | Admitting: Rehabilitation

## 2014-08-20 DIAGNOSIS — IMO0001 Reserved for inherently not codable concepts without codable children: Secondary | ICD-10-CM | POA: Diagnosis not present

## 2014-08-22 ENCOUNTER — Ambulatory Visit: Payer: Medicare HMO | Admitting: Rehabilitation

## 2014-08-22 DIAGNOSIS — IMO0001 Reserved for inherently not codable concepts without codable children: Secondary | ICD-10-CM | POA: Diagnosis not present

## 2014-08-28 ENCOUNTER — Ambulatory Visit: Payer: Medicare HMO | Attending: Rheumatology | Admitting: Rehabilitation

## 2014-08-28 DIAGNOSIS — M25551 Pain in right hip: Secondary | ICD-10-CM | POA: Insufficient documentation

## 2014-08-28 DIAGNOSIS — M6281 Muscle weakness (generalized): Secondary | ICD-10-CM | POA: Diagnosis not present

## 2014-08-28 DIAGNOSIS — Z5189 Encounter for other specified aftercare: Secondary | ICD-10-CM | POA: Insufficient documentation

## 2014-08-28 DIAGNOSIS — M545 Low back pain: Secondary | ICD-10-CM | POA: Diagnosis not present

## 2014-08-30 ENCOUNTER — Ambulatory Visit: Payer: Medicare HMO | Admitting: Rehabilitation

## 2014-08-30 DIAGNOSIS — Z5189 Encounter for other specified aftercare: Secondary | ICD-10-CM | POA: Diagnosis not present

## 2014-09-04 ENCOUNTER — Encounter: Payer: Managed Care, Other (non HMO) | Admitting: Rehabilitation

## 2014-09-04 ENCOUNTER — Ambulatory Visit: Payer: Medicare HMO | Admitting: Rehabilitation

## 2014-09-04 DIAGNOSIS — Z5189 Encounter for other specified aftercare: Secondary | ICD-10-CM | POA: Diagnosis not present

## 2014-09-06 ENCOUNTER — Encounter: Payer: Managed Care, Other (non HMO) | Admitting: Rehabilitation

## 2014-11-29 DIAGNOSIS — Z791 Long term (current) use of non-steroidal anti-inflammatories (NSAID): Secondary | ICD-10-CM | POA: Diagnosis not present

## 2014-11-29 DIAGNOSIS — M2550 Pain in unspecified joint: Secondary | ICD-10-CM | POA: Diagnosis not present

## 2014-11-29 DIAGNOSIS — E790 Hyperuricemia without signs of inflammatory arthritis and tophaceous disease: Secondary | ICD-10-CM | POA: Diagnosis not present

## 2014-11-29 DIAGNOSIS — M159 Polyosteoarthritis, unspecified: Secondary | ICD-10-CM | POA: Diagnosis not present

## 2014-11-29 DIAGNOSIS — M064 Inflammatory polyarthropathy: Secondary | ICD-10-CM | POA: Diagnosis not present

## 2014-11-29 DIAGNOSIS — M7061 Trochanteric bursitis, right hip: Secondary | ICD-10-CM | POA: Diagnosis not present

## 2015-01-21 ENCOUNTER — Ambulatory Visit (INDEPENDENT_AMBULATORY_CARE_PROVIDER_SITE_OTHER): Payer: Commercial Managed Care - HMO | Admitting: Ophthalmology

## 2015-01-21 DIAGNOSIS — H43813 Vitreous degeneration, bilateral: Secondary | ICD-10-CM

## 2015-01-21 DIAGNOSIS — E11319 Type 2 diabetes mellitus with unspecified diabetic retinopathy without macular edema: Secondary | ICD-10-CM

## 2015-01-21 DIAGNOSIS — H35033 Hypertensive retinopathy, bilateral: Secondary | ICD-10-CM | POA: Diagnosis not present

## 2015-01-21 DIAGNOSIS — H35371 Puckering of macula, right eye: Secondary | ICD-10-CM | POA: Diagnosis not present

## 2015-01-21 DIAGNOSIS — I10 Essential (primary) hypertension: Secondary | ICD-10-CM

## 2015-01-21 DIAGNOSIS — E11329 Type 2 diabetes mellitus with mild nonproliferative diabetic retinopathy without macular edema: Secondary | ICD-10-CM | POA: Diagnosis not present

## 2015-02-18 DIAGNOSIS — I10 Essential (primary) hypertension: Secondary | ICD-10-CM | POA: Diagnosis not present

## 2015-02-18 DIAGNOSIS — E1121 Type 2 diabetes mellitus with diabetic nephropathy: Secondary | ICD-10-CM | POA: Diagnosis not present

## 2015-02-18 DIAGNOSIS — E559 Vitamin D deficiency, unspecified: Secondary | ICD-10-CM | POA: Diagnosis not present

## 2015-02-21 DIAGNOSIS — E785 Hyperlipidemia, unspecified: Secondary | ICD-10-CM | POA: Diagnosis not present

## 2015-02-21 DIAGNOSIS — E1122 Type 2 diabetes mellitus with diabetic chronic kidney disease: Secondary | ICD-10-CM | POA: Diagnosis not present

## 2015-02-21 DIAGNOSIS — N182 Chronic kidney disease, stage 2 (mild): Secondary | ICD-10-CM | POA: Diagnosis not present

## 2015-02-21 DIAGNOSIS — E559 Vitamin D deficiency, unspecified: Secondary | ICD-10-CM | POA: Diagnosis not present

## 2015-02-21 DIAGNOSIS — I10 Essential (primary) hypertension: Secondary | ICD-10-CM | POA: Diagnosis not present

## 2015-03-20 ENCOUNTER — Encounter: Payer: Self-pay | Admitting: Internal Medicine

## 2015-05-30 DIAGNOSIS — E1122 Type 2 diabetes mellitus with diabetic chronic kidney disease: Secondary | ICD-10-CM | POA: Diagnosis not present

## 2015-05-30 DIAGNOSIS — M179 Osteoarthritis of knee, unspecified: Secondary | ICD-10-CM | POA: Diagnosis not present

## 2015-05-30 DIAGNOSIS — M0609 Rheumatoid arthritis without rheumatoid factor, multiple sites: Secondary | ICD-10-CM | POA: Diagnosis not present

## 2015-05-30 DIAGNOSIS — M2550 Pain in unspecified joint: Secondary | ICD-10-CM | POA: Diagnosis not present

## 2015-05-30 DIAGNOSIS — I10 Essential (primary) hypertension: Secondary | ICD-10-CM | POA: Diagnosis not present

## 2015-05-30 DIAGNOSIS — E559 Vitamin D deficiency, unspecified: Secondary | ICD-10-CM | POA: Diagnosis not present

## 2015-05-30 DIAGNOSIS — R5383 Other fatigue: Secondary | ICD-10-CM | POA: Diagnosis not present

## 2015-06-06 DIAGNOSIS — H26493 Other secondary cataract, bilateral: Secondary | ICD-10-CM | POA: Diagnosis not present

## 2015-06-06 DIAGNOSIS — H35371 Puckering of macula, right eye: Secondary | ICD-10-CM | POA: Diagnosis not present

## 2015-06-06 DIAGNOSIS — Z961 Presence of intraocular lens: Secondary | ICD-10-CM | POA: Diagnosis not present

## 2015-06-06 DIAGNOSIS — H40013 Open angle with borderline findings, low risk, bilateral: Secondary | ICD-10-CM | POA: Diagnosis not present

## 2015-06-13 DIAGNOSIS — E785 Hyperlipidemia, unspecified: Secondary | ICD-10-CM | POA: Diagnosis not present

## 2015-06-13 DIAGNOSIS — I509 Heart failure, unspecified: Secondary | ICD-10-CM | POA: Diagnosis not present

## 2015-06-13 DIAGNOSIS — E559 Vitamin D deficiency, unspecified: Secondary | ICD-10-CM | POA: Diagnosis not present

## 2015-06-13 DIAGNOSIS — E1122 Type 2 diabetes mellitus with diabetic chronic kidney disease: Secondary | ICD-10-CM | POA: Diagnosis not present

## 2015-06-13 DIAGNOSIS — I129 Hypertensive chronic kidney disease with stage 1 through stage 4 chronic kidney disease, or unspecified chronic kidney disease: Secondary | ICD-10-CM | POA: Diagnosis not present

## 2015-06-13 DIAGNOSIS — J449 Chronic obstructive pulmonary disease, unspecified: Secondary | ICD-10-CM | POA: Diagnosis not present

## 2015-06-13 DIAGNOSIS — N182 Chronic kidney disease, stage 2 (mild): Secondary | ICD-10-CM | POA: Diagnosis not present

## 2015-06-25 DIAGNOSIS — J209 Acute bronchitis, unspecified: Secondary | ICD-10-CM | POA: Diagnosis not present

## 2015-06-25 DIAGNOSIS — R05 Cough: Secondary | ICD-10-CM | POA: Diagnosis not present

## 2015-08-23 DIAGNOSIS — M0609 Rheumatoid arthritis without rheumatoid factor, multiple sites: Secondary | ICD-10-CM | POA: Diagnosis not present

## 2015-08-23 DIAGNOSIS — M2550 Pain in unspecified joint: Secondary | ICD-10-CM | POA: Diagnosis not present

## 2015-08-23 DIAGNOSIS — M179 Osteoarthritis of knee, unspecified: Secondary | ICD-10-CM | POA: Diagnosis not present

## 2015-08-23 DIAGNOSIS — R5383 Other fatigue: Secondary | ICD-10-CM | POA: Diagnosis not present

## 2015-09-12 DIAGNOSIS — Z0000 Encounter for general adult medical examination without abnormal findings: Secondary | ICD-10-CM | POA: Diagnosis not present

## 2015-09-12 DIAGNOSIS — Z1389 Encounter for screening for other disorder: Secondary | ICD-10-CM | POA: Diagnosis not present

## 2015-09-12 DIAGNOSIS — E1122 Type 2 diabetes mellitus with diabetic chronic kidney disease: Secondary | ICD-10-CM | POA: Diagnosis not present

## 2015-09-12 DIAGNOSIS — Z23 Encounter for immunization: Secondary | ICD-10-CM | POA: Diagnosis not present

## 2015-09-12 DIAGNOSIS — I129 Hypertensive chronic kidney disease with stage 1 through stage 4 chronic kidney disease, or unspecified chronic kidney disease: Secondary | ICD-10-CM | POA: Diagnosis not present

## 2015-09-12 DIAGNOSIS — I509 Heart failure, unspecified: Secondary | ICD-10-CM | POA: Diagnosis not present

## 2015-09-12 DIAGNOSIS — E785 Hyperlipidemia, unspecified: Secondary | ICD-10-CM | POA: Diagnosis not present

## 2015-09-12 DIAGNOSIS — F17211 Nicotine dependence, cigarettes, in remission: Secondary | ICD-10-CM | POA: Diagnosis not present

## 2015-09-12 DIAGNOSIS — E559 Vitamin D deficiency, unspecified: Secondary | ICD-10-CM | POA: Diagnosis not present

## 2015-09-19 DIAGNOSIS — F17211 Nicotine dependence, cigarettes, in remission: Secondary | ICD-10-CM | POA: Diagnosis not present

## 2015-09-19 DIAGNOSIS — N182 Chronic kidney disease, stage 2 (mild): Secondary | ICD-10-CM | POA: Diagnosis not present

## 2015-09-19 DIAGNOSIS — J449 Chronic obstructive pulmonary disease, unspecified: Secondary | ICD-10-CM | POA: Diagnosis not present

## 2015-09-19 DIAGNOSIS — E785 Hyperlipidemia, unspecified: Secondary | ICD-10-CM | POA: Diagnosis not present

## 2015-09-19 DIAGNOSIS — I129 Hypertensive chronic kidney disease with stage 1 through stage 4 chronic kidney disease, or unspecified chronic kidney disease: Secondary | ICD-10-CM | POA: Diagnosis not present

## 2015-09-19 DIAGNOSIS — Z23 Encounter for immunization: Secondary | ICD-10-CM | POA: Diagnosis not present

## 2015-09-19 DIAGNOSIS — I509 Heart failure, unspecified: Secondary | ICD-10-CM | POA: Diagnosis not present

## 2015-09-19 DIAGNOSIS — E1142 Type 2 diabetes mellitus with diabetic polyneuropathy: Secondary | ICD-10-CM | POA: Diagnosis not present

## 2015-09-26 DIAGNOSIS — M4722 Other spondylosis with radiculopathy, cervical region: Secondary | ICD-10-CM | POA: Diagnosis not present

## 2015-09-26 DIAGNOSIS — M25512 Pain in left shoulder: Secondary | ICD-10-CM | POA: Diagnosis not present

## 2015-10-07 ENCOUNTER — Encounter: Payer: Self-pay | Admitting: Internal Medicine

## 2015-10-08 DIAGNOSIS — M542 Cervicalgia: Secondary | ICD-10-CM | POA: Diagnosis not present

## 2015-10-15 DIAGNOSIS — M5412 Radiculopathy, cervical region: Secondary | ICD-10-CM | POA: Diagnosis not present

## 2015-10-22 DIAGNOSIS — M5412 Radiculopathy, cervical region: Secondary | ICD-10-CM | POA: Diagnosis not present

## 2015-10-25 DIAGNOSIS — Z1231 Encounter for screening mammogram for malignant neoplasm of breast: Secondary | ICD-10-CM | POA: Diagnosis not present

## 2015-10-28 DIAGNOSIS — M5412 Radiculopathy, cervical region: Secondary | ICD-10-CM | POA: Diagnosis not present

## 2015-10-29 DIAGNOSIS — M5412 Radiculopathy, cervical region: Secondary | ICD-10-CM | POA: Diagnosis not present

## 2015-10-31 DIAGNOSIS — M5412 Radiculopathy, cervical region: Secondary | ICD-10-CM | POA: Diagnosis not present

## 2015-10-31 DIAGNOSIS — H40013 Open angle with borderline findings, low risk, bilateral: Secondary | ICD-10-CM | POA: Diagnosis not present

## 2015-11-05 DIAGNOSIS — M5412 Radiculopathy, cervical region: Secondary | ICD-10-CM | POA: Diagnosis not present

## 2015-11-05 DIAGNOSIS — H40013 Open angle with borderline findings, low risk, bilateral: Secondary | ICD-10-CM | POA: Diagnosis not present

## 2015-11-06 ENCOUNTER — Encounter: Payer: Self-pay | Admitting: Internal Medicine

## 2015-11-07 DIAGNOSIS — M5412 Radiculopathy, cervical region: Secondary | ICD-10-CM | POA: Diagnosis not present

## 2015-11-12 DIAGNOSIS — M5412 Radiculopathy, cervical region: Secondary | ICD-10-CM | POA: Diagnosis not present

## 2015-11-14 DIAGNOSIS — M5412 Radiculopathy, cervical region: Secondary | ICD-10-CM | POA: Diagnosis not present

## 2015-11-19 DIAGNOSIS — M5412 Radiculopathy, cervical region: Secondary | ICD-10-CM | POA: Diagnosis not present

## 2015-11-26 DIAGNOSIS — M4722 Other spondylosis with radiculopathy, cervical region: Secondary | ICD-10-CM | POA: Diagnosis not present

## 2015-11-27 DIAGNOSIS — M5412 Radiculopathy, cervical region: Secondary | ICD-10-CM | POA: Diagnosis not present

## 2015-11-27 DIAGNOSIS — Z79899 Other long term (current) drug therapy: Secondary | ICD-10-CM | POA: Diagnosis not present

## 2015-11-27 DIAGNOSIS — M1990 Unspecified osteoarthritis, unspecified site: Secondary | ICD-10-CM | POA: Diagnosis not present

## 2015-11-27 DIAGNOSIS — M0609 Rheumatoid arthritis without rheumatoid factor, multiple sites: Secondary | ICD-10-CM | POA: Diagnosis not present

## 2015-12-02 ENCOUNTER — Other Ambulatory Visit: Payer: Self-pay | Admitting: Orthopedic Surgery

## 2015-12-06 ENCOUNTER — Encounter (HOSPITAL_COMMUNITY): Payer: Self-pay

## 2015-12-06 ENCOUNTER — Encounter (HOSPITAL_COMMUNITY)
Admission: RE | Admit: 2015-12-06 | Discharge: 2015-12-06 | Disposition: A | Discharge status: Home / Self Care | Payer: Commercial Managed Care - HMO | Source: Ambulatory Visit | Attending: Orthopedic Surgery | Admitting: Orthopedic Surgery

## 2015-12-06 ENCOUNTER — Encounter (HOSPITAL_COMMUNITY): Admission: RE | Admit: 2015-12-06 | Payer: Commercial Managed Care - HMO | Source: Ambulatory Visit

## 2015-12-06 DIAGNOSIS — E785 Hyperlipidemia, unspecified: Secondary | ICD-10-CM | POA: Diagnosis not present

## 2015-12-06 DIAGNOSIS — Z7984 Long term (current) use of oral hypoglycemic drugs: Secondary | ICD-10-CM | POA: Diagnosis not present

## 2015-12-06 DIAGNOSIS — Z01812 Encounter for preprocedural laboratory examination: Secondary | ICD-10-CM | POA: Diagnosis not present

## 2015-12-06 DIAGNOSIS — E119 Type 2 diabetes mellitus without complications: Secondary | ICD-10-CM | POA: Insufficient documentation

## 2015-12-06 DIAGNOSIS — M069 Rheumatoid arthritis, unspecified: Secondary | ICD-10-CM | POA: Diagnosis not present

## 2015-12-06 DIAGNOSIS — R9431 Abnormal electrocardiogram [ECG] [EKG]: Secondary | ICD-10-CM | POA: Insufficient documentation

## 2015-12-06 DIAGNOSIS — Z79899 Other long term (current) drug therapy: Secondary | ICD-10-CM | POA: Diagnosis not present

## 2015-12-06 DIAGNOSIS — K219 Gastro-esophageal reflux disease without esophagitis: Secondary | ICD-10-CM | POA: Insufficient documentation

## 2015-12-06 DIAGNOSIS — Z87891 Personal history of nicotine dependence: Secondary | ICD-10-CM | POA: Insufficient documentation

## 2015-12-06 DIAGNOSIS — Z981 Arthrodesis status: Secondary | ICD-10-CM | POA: Insufficient documentation

## 2015-12-06 DIAGNOSIS — Z0183 Encounter for blood typing: Secondary | ICD-10-CM | POA: Diagnosis not present

## 2015-12-06 DIAGNOSIS — M5410 Radiculopathy, site unspecified: Secondary | ICD-10-CM | POA: Insufficient documentation

## 2015-12-06 DIAGNOSIS — Z01818 Encounter for other preprocedural examination: Secondary | ICD-10-CM | POA: Diagnosis not present

## 2015-12-06 DIAGNOSIS — I10 Essential (primary) hypertension: Secondary | ICD-10-CM | POA: Diagnosis not present

## 2015-12-06 DIAGNOSIS — J45909 Unspecified asthma, uncomplicated: Secondary | ICD-10-CM | POA: Insufficient documentation

## 2015-12-06 HISTORY — DX: Presence of spectacles and contact lenses: Z97.3

## 2015-12-06 HISTORY — DX: Unspecified osteoarthritis, unspecified site: M19.90

## 2015-12-06 HISTORY — DX: Family history of other specified conditions: Z84.89

## 2015-12-06 HISTORY — DX: Radiculopathy, site unspecified: M54.10

## 2015-12-06 LAB — APTT: APTT: 29 s (ref 24–37)

## 2015-12-06 LAB — COMPREHENSIVE METABOLIC PANEL
ALT: 25 U/L (ref 14–54)
ANION GAP: 12 (ref 5–15)
AST: 19 U/L (ref 15–41)
Albumin: 4.2 g/dL (ref 3.5–5.0)
Alkaline Phosphatase: 79 U/L (ref 38–126)
BUN: 16 mg/dL (ref 6–20)
CHLORIDE: 100 mmol/L — AB (ref 101–111)
CO2: 28 mmol/L (ref 22–32)
Calcium: 9.8 mg/dL (ref 8.9–10.3)
Creatinine, Ser: 0.82 mg/dL (ref 0.44–1.00)
Glucose, Bld: 125 mg/dL — ABNORMAL HIGH (ref 65–99)
POTASSIUM: 3.7 mmol/L (ref 3.5–5.1)
Sodium: 140 mmol/L (ref 135–145)
TOTAL PROTEIN: 7.1 g/dL (ref 6.5–8.1)
Total Bilirubin: 0.5 mg/dL (ref 0.3–1.2)

## 2015-12-06 LAB — TYPE AND SCREEN
ABO/RH(D): AB NEG
ANTIBODY SCREEN: NEGATIVE

## 2015-12-06 LAB — CBC WITH DIFFERENTIAL/PLATELET
BASOS ABS: 0.1 10*3/uL (ref 0.0–0.1)
Basophils Relative: 1 %
EOS PCT: 1 %
Eosinophils Absolute: 0.1 10*3/uL (ref 0.0–0.7)
HCT: 37.3 % (ref 36.0–46.0)
Hemoglobin: 12 g/dL (ref 12.0–15.0)
LYMPHS PCT: 37 %
Lymphs Abs: 3.8 10*3/uL (ref 0.7–4.0)
MCH: 26.8 pg (ref 26.0–34.0)
MCHC: 32.2 g/dL (ref 30.0–36.0)
MCV: 83.4 fL (ref 78.0–100.0)
MONO ABS: 0.5 10*3/uL (ref 0.1–1.0)
MONOS PCT: 5 %
Neutro Abs: 5.6 10*3/uL (ref 1.7–7.7)
Neutrophils Relative %: 56 %
PLATELETS: 359 10*3/uL (ref 150–400)
RBC: 4.47 MIL/uL (ref 3.87–5.11)
RDW: 18.2 % — AB (ref 11.5–15.5)
WBC: 10.1 10*3/uL (ref 4.0–10.5)

## 2015-12-06 LAB — PROTIME-INR
INR: 1 (ref 0.00–1.49)
PROTHROMBIN TIME: 13.4 s (ref 11.6–15.2)

## 2015-12-06 LAB — URINALYSIS, ROUTINE W REFLEX MICROSCOPIC
BILIRUBIN URINE: NEGATIVE
GLUCOSE, UA: NEGATIVE mg/dL
KETONES UR: NEGATIVE mg/dL
Leukocytes, UA: NEGATIVE
Nitrite: NEGATIVE
PH: 5 (ref 5.0–8.0)
Protein, ur: NEGATIVE mg/dL
SPECIFIC GRAVITY, URINE: 1.013 (ref 1.005–1.030)

## 2015-12-06 LAB — GLUCOSE, CAPILLARY: GLUCOSE-CAPILLARY: 132 mg/dL — AB (ref 65–99)

## 2015-12-06 LAB — URINE MICROSCOPIC-ADD ON

## 2015-12-06 LAB — ABO/RH: ABO/RH(D): AB NEG

## 2015-12-06 LAB — SURGICAL PCR SCREEN
MRSA, PCR: NEGATIVE
Staphylococcus aureus: POSITIVE — AB

## 2015-12-06 NOTE — Progress Notes (Signed)
Pt denies SOB, chest pain, and being under the care of a cardiologist. Pt stated that an A1c was drawn in late November; records requested from Dr. Noah Delaine of [facility]. Pt left w/o completing a chest x ray as ordered by surgeon; please f/u with CXR on DOS. Pt chart forwarded to anesthesia to review cardiac history and abnormal EKG.

## 2015-12-06 NOTE — Pre-Procedure Instructions (Signed)
[name]  12/06/2015      [facility] [id number] - [place], [address] [place] [address] Phone: [phone] Fax: [phone]  [address] [place], [place] [address] Phone: [phone] Fax: [phone]    Your procedure is scheduled on Wednesday, December 11, 2015  Report to [facility] Admitting at 9:45 A.M.  Call this number if you have problems the morning of surgery:  [phone]   Remember:  Do not eat food or drink liquids after midnight Tuesday, December 10, 2015  Take these medicines the morning of surgery with A SIP OF WATER : diltiazem (CARDIZEM CD),ranitidine (ZANTAC), prednisoLONE acetate (PRED FORTE) eye drops, ofloxacin (OCUFLOX) eye drops, if needed:Cetirizine HCl (ZYRTEC) for allergies, fluticasone (FLONASE)  nasal spray for allergies  Do not take oral diabetes medicines ( pills) the morning of surgery  Stop taking Aspirin, vitamins, and herbal medications. Do not take any NSAIDs ie: Ibuprofen, Advil, Naproxen or any medication containing Aspirin such as meloxicam (MOBIC), Ketorolac, diclofenac (VOLTAREN); stop now.  How to Manage Your Diabetes Before Surgery Why is it important to control my blood sugar before and after surgery?   Improving blood sugar levels before and after surgery helps healing and can limit problems.  A way of improving blood sugar control is eating a healthy diet by:  - Eating less sugar and carbohydrates  - Increasing activity/exercise  - Talk with your doctor about reaching your blood sugar goals  High blood sugars (greater than 180 mg/dL) can raise your risk of infections and slow down your recovery so you will need to focus on controlling your diabetes during the weeks before surgery.  Make sure that the doctor who takes care of your diabetes knows about your  planned surgery including the date and location.  How do I manage my blood sugars before surgery?   Check your blood sugar at least 4 times a day, 2 days before surgery to make sure that they are not too high or low.   Check your blood sugar the morning of your surgery when you wake up and every 2 hours until you get to the Short-Stay unit.  If your blood sugar is less than 70 mg/dL, you will need to treat for low blood sugar by:  Treat a low blood sugar (less than 70 mg/dL) with 1/2 cup of clear juice (cranberry or apple), 4 glucose tablets, OR glucose gel.  Recheck blood sugar in 15 minutes after treatment (to make sure it is greater than 70 mg/dL).  If blood sugar is not greater than 70 mg/dL on re-check, call [phone] for further instructions.   Report your blood sugar to the Short-Stay nurse when you get to Short-Stay.  References:  [facility], 2007 "How to Manage your Diabetes Before and After Surgery".  Do not wear jewelry, make-up or nail polish.  Do not wear lotions, powders, or perfumes.  You may not wear deodorant.  Do not shave 48 hours prior to surgery.   Do not bring valuables to the hospital.  [facility] is not responsible for any belongings or valuables.  Contacts, dentures or bridgework may not be worn into surgery.  Leave your suitcase in the car.  After surgery it may be brought to your room.  For patients admitted to  the hospital, discharge time will be determined by your treatment team.  Patients discharged the day of surgery will not be allowed to drive home.   Name and phone number of your driver:   Special instructions: Shower the night before surgery and the morning of surgery with CHG.  Please read over the following fact sheets that you were given. Pain Booklet, Coughing and Deep Breathing, Blood Transfusion Information, MRSA Information and Surgical Site Infection Prevention

## 2015-12-09 DIAGNOSIS — M5412 Radiculopathy, cervical region: Secondary | ICD-10-CM | POA: Diagnosis not present

## 2015-12-09 NOTE — Progress Notes (Signed)
Anesthesia Chart Review: Patient is a 70 year old female scheduled for C6-7, C7-T1 ACDF on 12/11/15.   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 Dossie Der), hysterectomy, C5-6 ACDF '97, back surgery, tonsillectomy, right thumb arthrodesis 07/2009. Reported her first cousin had "difficulty waking up" after anesthesia. BMI is consistent with obesity. PCP is Dr. Thressa Sheller with [facility].  Meds include Bumex, Zyrtec, diltiazem, enalapril, Flonase, Folvite, lovastatin, metformin, methotrexate, Zantac.   12/06/15 EKG: SR, possible LAE, LAD, possible anterior infarct (age undetermined). Rate is increased when compared to previous 08/16/09 tracing in [place]. She denied CP and SOB at PAT.  12/19/12 ETT (appears to have been ordered by her PCP): ETT Interpretation: normal - no evidence of ischemia by ST analysis. Comments: Poor exercise tolerance. She did note post-exercise chest pain. Normal BP response to exercise. No significant ST-T changes to suggest ischemia.  Recommendations: With patient who has diabetes (risk equivalent) would consider pharmacologic nuclear study if symptoms continue [facility]) for greater sensitivity/specificity. Richardson Dopp, PA-C)  Reportedly she had a normal cath in 1998 (report not currently available).  She had a limited echo done in 2004 during an admission for bacteremia. No obvious vegetation seen, overall LV function appeared to be in the normal range but study was inadequate for evaluation of LV regional wall motion.   Preoperative labs noted. A1c on 09/12/15 (PCP) was 6.2.   Apparently, surgeon ordered a CXR but patient left PAT prior to this being completed so it will be done on the day of surgery.   Reviewed above with anesthesiologist Dr. Tobias Alexander. If she remains asymptomatic from a CV standpoint then anticipate that she can proceed as planned.  [facility]  Short Stay Center/Anesthesiology Phone [phone] 12/09/2015 5:24 PM

## 2015-12-10 NOTE — H&P (Signed)
PREOPERATIVE H&P  Chief Complaint: left arm pain  HPI: [name] is a 70 y.o. female who presents with ongoing pain in the left arm  MRI reveals stenosis C6-T1  Patient has failed multiple forms of conservative care and continues to have pain (see office notes for additional details regarding the patient's full course of treatment)  Past Medical History  Diagnosis Date  . Hypertension   . Fibroid   . Ovarian cyst   . GERD (gastroesophageal reflux disease)   . Asthma   . Hyperlipidemia   . Diabetes mellitus     type 2  . Radiculopathy   . Family history of adverse reaction to anesthesia     my first cousin had difficulty waking up   . Wears glasses   . Arthritis    Past Surgical History  Procedure Laterality Date  . Thumb surgery  2010  . Abdominal hysterectomy  1998    TAH,BSO  . Back surgery      "NECK DISC" SURGERY  . Laparoscopic ovarian cystectomy  1970  . Cardiac catheterization  1998  . Rotator cuff repair  02/2003  . Shoulder surgery  10/2003  . Cataract extraction w/ intraocular lens  implant, bilateral    . Tonsillectomy    . Colonoscopy    . Tubal ligation     Social History   Social History  . Marital Status: Married    Spouse Name: N/A  . Number of Children: N/A  . Years of Education: N/A   Social History Main Topics  . Smoking status: Former Smoker    Types: Cigarettes    Quit date: 11/23/1992  . Smokeless tobacco: Never Used  . Alcohol Use: No  . Drug Use: No  . Sexual Activity: Not on file   Other Topics Concern  . Not on file   Social History Narrative   Family History  Problem Relation Age of Onset  . Diabetes Sister   . Diabetes Brother   . Cancer Paternal Aunt     UTERINE  . Hypertension Maternal Grandmother   . Heart disease Maternal Grandmother   . Diabetes Brother    Allergies  Allergen Reactions  . Prednisone     REACTION: reflux   Prior to Admission medications   Medication Sig Start Date End Date  Taking? Authorizing Provider  bumetanide (BUMEX) 0.5 MG tablet Take 0.5 mg by mouth 2 (two) times daily.     Yes Historical Provider, MD  Cetirizine HCl (ZYRTEC PO) Take 1 tablet by mouth daily as needed. For allergies   Yes Historical Provider, MD  cholecalciferol (VITAMIN D) 1000 units tablet Take 1,000 Units by mouth daily.   Yes Historical Provider, MD  diclofenac (VOLTAREN) 75 MG EC tablet Take 75 mg by mouth 2 (two) times daily.   Yes Historical Provider, MD  diltiazem (CARDIZEM CD) 120 MG 24 hr capsule Take 120 mg by mouth daily.     Yes Historical Provider, MD  enalapril (VASOTEC) 20 MG tablet Take 20 mg by mouth daily.  08/05/13  Yes Historical Provider, MD  fluticasone (FLONASE) 50 MCG/ACT nasal spray Place 1 spray into both nostrils daily as needed for allergies or rhinitis.   Yes Historical Provider, MD  folic acid (FOLVITE) 1 MG tablet Take 2 mg by mouth daily.   Yes Historical Provider, MD  lovastatin (MEVACOR) 40 MG tablet Take 40 mg by mouth at bedtime.  04/10/14  Yes Historical Provider, MD  metFORMIN (GLUCOPHAGE)  500 MG tablet Take 500-1,000 mg by mouth 2 (two) times daily with a meal. Take 1 tablet in the morning Take 2 tablets with dinner   Yes Historical Provider, MD  methotrexate (RHEUMATREX) 2.5 MG tablet Take 12.5 mg by mouth once a week. Caution:Chemotherapy. Protect from light.   Yes Historical Provider, MD  Multiple Vitamin (MULTIVITAMIN) capsule Take 1 capsule by mouth daily.     Yes Historical Provider, MD  ranitidine (ZANTAC) 300 MG tablet Take 300 mg by mouth 2 (two) times daily.  10/16/13  Yes Historical Provider, MD  bacitracin ophthalmic ointment  06/05/14   Historical Provider, MD  ketorolac (ACULAR) 0.5 % ophthalmic solution 1 drop.  06/05/14   Historical Provider, MD  meloxicam (MOBIC) 15 MG tablet Take 15 mg by mouth daily.    Historical Provider, MD  ofloxacin (OCUFLOX) 0.3 % ophthalmic solution  06/05/14   Historical Provider, MD  prednisoLONE acetate (PRED  FORTE) 1 % ophthalmic suspension  06/05/14   Historical Provider, MD     All other systems have been reviewed and were otherwise negative with the exception of those mentioned in the HPI and as above.  Physical Exam: There were no vitals filed for this visit.  General: Alert, no acute distress Cardiovascular: No pedal edema Respiratory: No cyanosis, no use of accessory musculature Skin: No lesions in the area of chief complaint Neurologic: Sensation intact distally Psychiatric: Patient is competent for consent with normal mood and affect Lymphatic: No axillary or cervical lymphadenopathy  MUSCULOSKELETAL: + spurling on the left  Assessment/Plan: Radiculopathy Plan for Procedure(s): ANTERIOR CERVICAL DECOMPRESSION/DISCECTOMY FUSION 2 LEVELS   Sinclair Ship, MD 12/10/2015 8:23 AM

## 2015-12-11 ENCOUNTER — Inpatient Hospital Stay (HOSPITAL_COMMUNITY): Payer: Commercial Managed Care - HMO

## 2015-12-11 ENCOUNTER — Inpatient Hospital Stay (HOSPITAL_COMMUNITY)
Admission: RE | Admit: 2015-12-11 | Discharge: 2015-12-12 | DRG: 473 | Disposition: A | Discharge status: Home / Self Care | Payer: Commercial Managed Care - HMO | Source: Ambulatory Visit | Attending: Orthopedic Surgery | Admitting: Orthopedic Surgery

## 2015-12-11 ENCOUNTER — Encounter (HOSPITAL_COMMUNITY)
Admission: RE | Disposition: A | Discharge status: Home / Self Care | Payer: Medicare HMO | Source: Ambulatory Visit | Attending: Orthopedic Surgery

## 2015-12-11 ENCOUNTER — Encounter (HOSPITAL_COMMUNITY): Payer: Self-pay | Admitting: Certified Registered Nurse Anesthetist

## 2015-12-11 ENCOUNTER — Inpatient Hospital Stay (HOSPITAL_COMMUNITY): Payer: Commercial Managed Care - HMO | Admitting: Certified Registered Nurse Anesthetist

## 2015-12-11 ENCOUNTER — Inpatient Hospital Stay (HOSPITAL_COMMUNITY): Payer: Commercial Managed Care - HMO | Admitting: Vascular Surgery

## 2015-12-11 DIAGNOSIS — M5412 Radiculopathy, cervical region: Secondary | ICD-10-CM | POA: Diagnosis not present

## 2015-12-11 DIAGNOSIS — Z7984 Long term (current) use of oral hypoglycemic drugs: Secondary | ICD-10-CM

## 2015-12-11 DIAGNOSIS — Z8249 Family history of ischemic heart disease and other diseases of the circulatory system: Secondary | ICD-10-CM

## 2015-12-11 DIAGNOSIS — Z01818 Encounter for other preprocedural examination: Secondary | ICD-10-CM

## 2015-12-11 DIAGNOSIS — Z888 Allergy status to other drugs, medicaments and biological substances status: Secondary | ICD-10-CM | POA: Diagnosis not present

## 2015-12-11 DIAGNOSIS — Z87891 Personal history of nicotine dependence: Secondary | ICD-10-CM

## 2015-12-11 DIAGNOSIS — M4322 Fusion of spine, cervical region: Secondary | ICD-10-CM | POA: Diagnosis not present

## 2015-12-11 DIAGNOSIS — E785 Hyperlipidemia, unspecified: Secondary | ICD-10-CM | POA: Diagnosis not present

## 2015-12-11 DIAGNOSIS — K219 Gastro-esophageal reflux disease without esophagitis: Secondary | ICD-10-CM | POA: Diagnosis not present

## 2015-12-11 DIAGNOSIS — I10 Essential (primary) hypertension: Secondary | ICD-10-CM | POA: Diagnosis present

## 2015-12-11 DIAGNOSIS — M4803 Spinal stenosis, cervicothoracic region: Secondary | ICD-10-CM | POA: Diagnosis not present

## 2015-12-11 DIAGNOSIS — Z833 Family history of diabetes mellitus: Secondary | ICD-10-CM

## 2015-12-11 DIAGNOSIS — E119 Type 2 diabetes mellitus without complications: Secondary | ICD-10-CM | POA: Diagnosis present

## 2015-12-11 DIAGNOSIS — M4802 Spinal stenosis, cervical region: Secondary | ICD-10-CM | POA: Diagnosis not present

## 2015-12-11 DIAGNOSIS — Z79899 Other long term (current) drug therapy: Secondary | ICD-10-CM

## 2015-12-11 DIAGNOSIS — M1990 Unspecified osteoarthritis, unspecified site: Secondary | ICD-10-CM | POA: Diagnosis not present

## 2015-12-11 DIAGNOSIS — M79602 Pain in left arm: Secondary | ICD-10-CM | POA: Diagnosis not present

## 2015-12-11 DIAGNOSIS — J45909 Unspecified asthma, uncomplicated: Secondary | ICD-10-CM | POA: Diagnosis not present

## 2015-12-11 DIAGNOSIS — M5410 Radiculopathy, site unspecified: Secondary | ICD-10-CM | POA: Diagnosis present

## 2015-12-11 DIAGNOSIS — Z419 Encounter for procedure for purposes other than remedying health state, unspecified: Secondary | ICD-10-CM

## 2015-12-11 HISTORY — PX: ANTERIOR CERVICAL DECOMP/DISCECTOMY FUSION: SHX1161

## 2015-12-11 LAB — GLUCOSE, CAPILLARY
GLUCOSE-CAPILLARY: 101 mg/dL — AB (ref 65–99)
GLUCOSE-CAPILLARY: 131 mg/dL — AB (ref 65–99)
GLUCOSE-CAPILLARY: 108 mg/dL — AB (ref 65–99)
Glucose-Capillary: 80 mg/dL (ref 65–99)
Glucose-Capillary: 115 mg/dL — ABNORMAL HIGH (ref 65–99)

## 2015-12-11 SURGERY — ANTERIOR CERVICAL DECOMPRESSION/DISCECTOMY FUSION 2 LEVELS
Anesthesia: General | Site: Neck

## 2015-12-11 MED ORDER — METOPROLOL TARTRATE 1 MG/ML IV SOLN
INTRAVENOUS | Status: AC
  Filled 2015-12-11: qty 5

## 2015-12-11 MED ORDER — FOLIC ACID 1 MG PO TABS
2.0000 mg | ORAL_TABLET | Freq: Every day | ORAL | Status: DC
  Filled 2015-12-11: qty 2

## 2015-12-11 MED ORDER — DOCUSATE SODIUM 100 MG PO CAPS
100.0000 mg | ORAL_CAPSULE | Freq: Two times a day (BID) | ORAL | Status: DC
  Administered 2015-12-11: 100 mg via ORAL
  Filled 2015-12-11: qty 1

## 2015-12-11 MED ORDER — METFORMIN HCL 500 MG PO TABS: 500.0000 mg | ORAL_TABLET | Freq: Two times a day (BID) | ORAL | Status: DC

## 2015-12-11 MED ORDER — ACETAMINOPHEN 325 MG PO TABS: 650.0000 mg | ORAL_TABLET | ORAL | Status: DC | PRN

## 2015-12-11 MED ORDER — MIDAZOLAM HCL 2 MG/2ML IJ SOLN
INTRAMUSCULAR | Status: AC
  Filled 2015-12-11: qty 2

## 2015-12-11 MED ORDER — METHOTREXATE 2.5 MG PO TABS
12.5000 mg | ORAL_TABLET | ORAL | Status: DC
Start: 2015-12-15 — End: 2015-12-12

## 2015-12-11 MED ORDER — MULTIVITAMINS PO CAPS: 1.0000 | ORAL_CAPSULE | Freq: Every day | ORAL | Status: DC

## 2015-12-11 MED ORDER — ADULT MULTIVITAMIN W/MINERALS CH: 1.0000 | ORAL_TABLET | Freq: Every day | ORAL | Status: DC

## 2015-12-11 MED ORDER — GLYCOPYRROLATE 0.2 MG/ML IJ SOLN
INTRAMUSCULAR | Status: AC
  Filled 2015-12-11: qty 1

## 2015-12-11 MED ORDER — ONDANSETRON HCL 4 MG/2ML IJ SOLN
INTRAMUSCULAR | Status: AC
  Filled 2015-12-11: qty 2

## 2015-12-11 MED ORDER — LIDOCAINE HCL (CARDIAC) 20 MG/ML IV SOLN
INTRAVENOUS | Status: DC | PRN
  Administered 2015-12-11: 2 mL via INTRATRACHEAL

## 2015-12-11 MED ORDER — ARTIFICIAL TEARS OP OINT
TOPICAL_OINTMENT | OPHTHALMIC | Status: AC
  Filled 2015-12-11: qty 3.5

## 2015-12-11 MED ORDER — THROMBIN 20000 UNITS EX KIT
PACK | CUTANEOUS | Status: DC | PRN
  Administered 2015-12-11: 20000 [IU] via TOPICAL

## 2015-12-11 MED ORDER — BISACODYL 5 MG PO TBEC: 5.0000 mg | DELAYED_RELEASE_TABLET | Freq: Every day | ORAL | Status: DC | PRN

## 2015-12-11 MED ORDER — METFORMIN HCL 500 MG PO TABS: 500.0000 mg | ORAL_TABLET | Freq: Every day | ORAL | Status: DC

## 2015-12-11 MED ORDER — SODIUM CHLORIDE 0.9 % IJ SOLN: 3.0000 mL | Freq: Two times a day (BID) | INTRAMUSCULAR | Status: DC

## 2015-12-11 MED ORDER — SODIUM CHLORIDE 0.9 % IJ SOLN: 3.0000 mL | INTRAMUSCULAR | Status: DC | PRN

## 2015-12-11 MED ORDER — ONDANSETRON HCL 4 MG/2ML IJ SOLN
4.0000 mg | Freq: Once | INTRAMUSCULAR | Status: AC | PRN
  Administered 2015-12-11: 4 mg via INTRAVENOUS

## 2015-12-11 MED ORDER — PROPOFOL 10 MG/ML IV BOLUS
INTRAVENOUS | Status: DC | PRN
  Administered 2015-12-11 (×2): 50 mg via INTRAVENOUS

## 2015-12-11 MED ORDER — FLEET ENEMA 7-19 GM/118ML RE ENEM: 1.0000 | ENEMA | Freq: Once | RECTAL | Status: DC | PRN

## 2015-12-11 MED ORDER — ENALAPRIL MALEATE 20 MG PO TABS
20.0000 mg | ORAL_TABLET | Freq: Every day | ORAL | Status: DC
  Filled 2015-12-11: qty 1

## 2015-12-11 MED ORDER — PHENYLEPHRINE HCL 10 MG/ML IJ SOLN
INTRAMUSCULAR | Status: DC | PRN
  Administered 2015-12-11: 160 ug via INTRAVENOUS
  Administered 2015-12-11 (×2): 120 ug via INTRAVENOUS
  Administered 2015-12-11: 160 ug via INTRAVENOUS

## 2015-12-11 MED ORDER — GLYCOPYRROLATE 0.2 MG/ML IJ SOLN
INTRAMUSCULAR | Status: DC | PRN
  Administered 2015-12-11: 0.1 mg via INTRAVENOUS

## 2015-12-11 MED ORDER — OXYCODONE-ACETAMINOPHEN 5-325 MG PO TABS
1.0000 | ORAL_TABLET | ORAL | Status: DC | PRN
  Administered 2015-12-12: 2 via ORAL
  Administered 2015-12-12: 1 via ORAL
  Filled 2015-12-11: qty 1
  Filled 2015-12-11: qty 2

## 2015-12-11 MED ORDER — PRAVASTATIN SODIUM 40 MG PO TABS: 40.0000 mg | ORAL_TABLET | Freq: Every day | ORAL | Status: DC

## 2015-12-11 MED ORDER — MENTHOL 3 MG MT LOZG: 1.0000 | LOZENGE | OROMUCOSAL | Status: DC | PRN

## 2015-12-11 MED ORDER — EPHEDRINE SULFATE 50 MG/ML IJ SOLN
INTRAMUSCULAR | Status: AC
  Filled 2015-12-11: qty 1

## 2015-12-11 MED ORDER — THROMBIN 20000 UNITS EX SOLR
CUTANEOUS | Status: AC
  Filled 2015-12-11: qty 20000

## 2015-12-11 MED ORDER — LIDOCAINE HCL (CARDIAC) 20 MG/ML IV SOLN
INTRAVENOUS | Status: AC
  Filled 2015-12-11: qty 5

## 2015-12-11 MED ORDER — VITAMIN D 1000 UNITS PO TABS: 1000.0000 [IU] | ORAL_TABLET | Freq: Every day | ORAL | Status: DC

## 2015-12-11 MED ORDER — PHENOL 1.4 % MT LIQD
1.0000 | OROMUCOSAL | Status: DC | PRN
  Administered 2015-12-11: 1 via OROMUCOSAL
  Filled 2015-12-11: qty 177

## 2015-12-11 MED ORDER — FENTANYL CITRATE (PF) 250 MCG/5ML IJ SOLN
INTRAMUSCULAR | Status: AC
  Filled 2015-12-11: qty 5

## 2015-12-11 MED ORDER — SUGAMMADEX SODIUM 200 MG/2ML IV SOLN
INTRAVENOUS | Status: AC
  Filled 2015-12-11: qty 2

## 2015-12-11 MED ORDER — LACTATED RINGERS IV SOLN
INTRAVENOUS | Status: DC
  Administered 2015-12-11 (×3): via INTRAVENOUS

## 2015-12-11 MED ORDER — CEFAZOLIN SODIUM-DEXTROSE 2-3 GM-% IV SOLR
2.0000 g | INTRAVENOUS | Status: AC
  Administered 2015-12-11: 2 g via INTRAVENOUS
  Filled 2015-12-11: qty 50

## 2015-12-11 MED ORDER — METOPROLOL TARTRATE 1 MG/ML IV SOLN
INTRAVENOUS | Status: DC | PRN
  Administered 2015-12-11 (×5): 1 mg via INTRAVENOUS

## 2015-12-11 MED ORDER — HYDROMORPHONE HCL 1 MG/ML IJ SOLN
INTRAMUSCULAR | Status: DC | PRN
  Administered 2015-12-11: .25 mg via INTRAVENOUS

## 2015-12-11 MED ORDER — EPHEDRINE SULFATE 50 MG/ML IJ SOLN
INTRAMUSCULAR | Status: DC | PRN
  Administered 2015-12-11: 10 mg via INTRAVENOUS

## 2015-12-11 MED ORDER — LIDOCAINE HCL 4 % EX SOLN
CUTANEOUS | Status: DC | PRN
  Administered 2015-12-11: 3 mL via TOPICAL

## 2015-12-11 MED ORDER — ACETAMINOPHEN 650 MG RE SUPP: 650.0000 mg | RECTAL | Status: DC | PRN

## 2015-12-11 MED ORDER — CEFAZOLIN SODIUM 1-5 GM-% IV SOLN
1.0000 g | Freq: Three times a day (TID) | INTRAVENOUS | Status: AC
  Administered 2015-12-11 – 2015-12-12 (×2): 1 g via INTRAVENOUS
  Filled 2015-12-11 (×2): qty 50

## 2015-12-11 MED ORDER — SUGAMMADEX SODIUM 200 MG/2ML IV SOLN
INTRAVENOUS | Status: DC | PRN
  Administered 2015-12-11: 200 mg via INTRAVENOUS

## 2015-12-11 MED ORDER — ROCURONIUM BROMIDE 100 MG/10ML IV SOLN
INTRAVENOUS | Status: DC | PRN
  Administered 2015-12-11: 50 mg via INTRAVENOUS

## 2015-12-11 MED ORDER — PROPOFOL 10 MG/ML IV BOLUS
INTRAVENOUS | Status: AC
  Filled 2015-12-11: qty 20

## 2015-12-11 MED ORDER — MORPHINE SULFATE (PF) 2 MG/ML IV SOLN
1.0000 mg | INTRAVENOUS | Status: DC | PRN
  Administered 2015-12-11: 2 mg via INTRAVENOUS
  Filled 2015-12-11: qty 1

## 2015-12-11 MED ORDER — SENNOSIDES-DOCUSATE SODIUM 8.6-50 MG PO TABS: 1.0000 | ORAL_TABLET | Freq: Every evening | ORAL | Status: DC | PRN

## 2015-12-11 MED ORDER — FENTANYL CITRATE (PF) 100 MCG/2ML IJ SOLN
INTRAMUSCULAR | Status: DC | PRN
  Administered 2015-12-11: 50 ug via INTRAVENOUS
  Administered 2015-12-11: 150 ug via INTRAVENOUS
  Administered 2015-12-11: 50 ug via INTRAVENOUS

## 2015-12-11 MED ORDER — MIDAZOLAM HCL 2 MG/2ML IJ SOLN
INTRAMUSCULAR | Status: DC | PRN
  Administered 2015-12-11 (×2): 1 mg via INTRAVENOUS

## 2015-12-11 MED ORDER — FLUTICASONE PROPIONATE 50 MCG/ACT NA SUSP: 1.0000 | Freq: Every day | NASAL | Status: DC | PRN

## 2015-12-11 MED ORDER — DILTIAZEM HCL ER COATED BEADS 120 MG PO CP24: 120.0000 mg | ORAL_CAPSULE | Freq: Every day | ORAL | Status: DC

## 2015-12-11 MED ORDER — FENTANYL CITRATE (PF) 100 MCG/2ML IJ SOLN
25.0000 ug | INTRAMUSCULAR | Status: DC | PRN
  Administered 2015-12-11 (×2): 25 ug via INTRAVENOUS

## 2015-12-11 MED ORDER — ROCURONIUM BROMIDE 50 MG/5ML IV SOLN
INTRAVENOUS | Status: AC
  Filled 2015-12-11: qty 2

## 2015-12-11 MED ORDER — HYDROCODONE-ACETAMINOPHEN 5-325 MG PO TABS: 1.0000 | ORAL_TABLET | ORAL | Status: DC | PRN

## 2015-12-11 MED ORDER — PHENYLEPHRINE 40 MCG/ML (10ML) SYRINGE FOR IV PUSH (FOR BLOOD PRESSURE SUPPORT)
PREFILLED_SYRINGE | INTRAVENOUS | Status: AC
  Filled 2015-12-11: qty 20

## 2015-12-11 MED ORDER — ALUM & MAG HYDROXIDE-SIMETH 200-200-20 MG/5ML PO SUSP: 30.0000 mL | Freq: Four times a day (QID) | ORAL | Status: DC | PRN

## 2015-12-11 MED ORDER — DIAZEPAM 5 MG PO TABS: 5.0000 mg | ORAL_TABLET | Freq: Four times a day (QID) | ORAL | Status: DC | PRN

## 2015-12-11 MED ORDER — HEMOSTATIC AGENTS (NO CHARGE) OPTIME
TOPICAL | Status: DC | PRN
  Administered 2015-12-11: 1 via TOPICAL

## 2015-12-11 MED ORDER — ZOLPIDEM TARTRATE 5 MG PO TABS: 5.0000 mg | ORAL_TABLET | Freq: Every evening | ORAL | Status: DC | PRN

## 2015-12-11 MED ORDER — METFORMIN HCL 500 MG PO TABS
1000.0000 mg | ORAL_TABLET | Freq: Every day | ORAL | Status: DC
  Administered 2015-12-11: 1000 mg via ORAL
  Filled 2015-12-11: qty 2

## 2015-12-11 MED ORDER — BUPIVACAINE-EPINEPHRINE (PF) 0.25% -1:200000 IJ SOLN
INTRAMUSCULAR | Status: AC
  Filled 2015-12-11: qty 30

## 2015-12-11 MED ORDER — ONDANSETRON HCL 4 MG/2ML IJ SOLN
INTRAMUSCULAR | Status: DC | PRN
  Administered 2015-12-11: 4 mg via INTRAVENOUS

## 2015-12-11 MED ORDER — PHENYLEPHRINE HCL 10 MG/ML IJ SOLN
10.0000 mg | INTRAMUSCULAR | Status: DC | PRN
  Administered 2015-12-11: 50 ug/min via INTRAVENOUS

## 2015-12-11 MED ORDER — 0.9 % SODIUM CHLORIDE (POUR BTL) OPTIME
TOPICAL | Status: DC | PRN
  Administered 2015-12-11: 1000 mL

## 2015-12-11 MED ORDER — ONDANSETRON HCL 4 MG/2ML IJ SOLN
4.0000 mg | INTRAMUSCULAR | Status: DC | PRN
  Administered 2015-12-11: 4 mg via INTRAVENOUS
  Filled 2015-12-11: qty 2

## 2015-12-11 MED ORDER — FENTANYL CITRATE (PF) 100 MCG/2ML IJ SOLN
INTRAMUSCULAR | Status: AC
  Filled 2015-12-11: qty 2

## 2015-12-11 MED ORDER — BUPIVACAINE-EPINEPHRINE 0.25% -1:200000 IJ SOLN
INTRAMUSCULAR | Status: DC | PRN
Start: 2015-12-11 — End: 2015-12-11
  Administered 2015-12-11: 30 mL

## 2015-12-11 MED ORDER — BUMETANIDE 0.5 MG PO TABS
0.5000 mg | ORAL_TABLET | Freq: Two times a day (BID) | ORAL | Status: DC
  Administered 2015-12-12: 0.5 mg via ORAL
  Filled 2015-12-11 (×4): qty 1

## 2015-12-11 MED ORDER — HYDROMORPHONE HCL 1 MG/ML IJ SOLN
INTRAMUSCULAR | Status: AC
  Filled 2015-12-11: qty 1

## 2015-12-11 MED ORDER — POVIDONE-IODINE 7.5 % EX SOLN: Freq: Once | CUTANEOUS | Status: DC

## 2015-12-11 SURGICAL SUPPLY — 79 items
APL SKNCLS STERI-STRIP NONHPOA (GAUZE/BANDAGES/DRESSINGS) ×1
BENZOIN TINCTURE PRP APPL 2/3 (GAUZE/BANDAGES/DRESSINGS) ×3 IMPLANT
BIT DRILL NEURO 2X3.1 SFT TUCH (MISCELLANEOUS) ×1 IMPLANT
BIT DRILL SKYLINE 12MM (BIT) ×1 IMPLANT
BLADE SURG 15 STRL LF DISP TIS (BLADE) ×1 IMPLANT
BLADE SURG 15 STRL SS (BLADE) ×3
BLADE SURG ROTATE 9660 (MISCELLANEOUS) ×3 IMPLANT
BUR MATCHSTICK NEURO 3.0 LAGG (BURR) IMPLANT
CARTRIDGE OIL MAESTRO DRILL (MISCELLANEOUS) ×1 IMPLANT
CLOSURE STERI-STRIP 1/2X4 (GAUZE/BANDAGES/DRESSINGS) ×1
CLOSURE WOUND 1/2 X4 (GAUZE/BANDAGES/DRESSINGS) ×1
CLSR STERI-STRIP ANTIMIC 1/2X4 (GAUZE/BANDAGES/DRESSINGS) ×2 IMPLANT
COLLAR CERV LO CONTOUR FIRM DE (SOFTGOODS) IMPLANT
CORDS BIPOLAR (ELECTRODE) ×3 IMPLANT
COVER SURGICAL LIGHT HANDLE (MISCELLANEOUS) ×3 IMPLANT
CRADLE DONUT ADULT HEAD (MISCELLANEOUS) ×3 IMPLANT
DECANTER SPIKE VIAL GLASS SM (MISCELLANEOUS) ×3 IMPLANT
DEVICE ENDSKLTN CRVCL 5MM-0SM (Orthopedic Implant) ×1 IMPLANT
DIFFUSER DRILL AIR PNEUMATIC (MISCELLANEOUS) ×3 IMPLANT
DRAIN JACKSON RD 7FR 3/32 (WOUND CARE) IMPLANT
DRAPE C-ARM 42X72 X-RAY (DRAPES) ×3 IMPLANT
DRAPE POUCH INSTRU U-SHP 10X18 (DRAPES) ×3 IMPLANT
DRAPE SURG 17X23 STRL (DRAPES) ×9 IMPLANT
DRILL BIT SKYLINE 12MM (BIT) ×3
DRILL NEURO 2X3.1 SOFT TOUCH (MISCELLANEOUS) ×3
DURAPREP 26ML APPLICATOR (WOUND CARE) ×3 IMPLANT
ELECT COATED BLADE 2.86 ST (ELECTRODE) ×3 IMPLANT
ELECT REM PT RETURN 9FT ADLT (ELECTROSURGICAL) ×3
ELECTRODE REM PT RTRN 9FT ADLT (ELECTROSURGICAL) ×1 IMPLANT
ENDOSKELETON CERVICAL 5MM-0SM (Orthopedic Implant) ×3 IMPLANT
EVACUATOR SILICONE 100CC (DRAIN) IMPLANT
GAUZE SPONGE 4X4 12PLY STRL (GAUZE/BANDAGES/DRESSINGS) ×3 IMPLANT
GAUZE SPONGE 4X4 16PLY XRAY LF (GAUZE/BANDAGES/DRESSINGS) ×3 IMPLANT
GLOVE BIO SURGEON STRL SZ7 (GLOVE) ×3 IMPLANT
GLOVE BIO SURGEON STRL SZ8 (GLOVE) ×3 IMPLANT
GLOVE BIOGEL PI IND STRL 7.5 (GLOVE) ×2 IMPLANT
GLOVE BIOGEL PI IND STRL 8 (GLOVE) ×1 IMPLANT
GLOVE BIOGEL PI INDICATOR 7.5 (GLOVE) ×4
GLOVE BIOGEL PI INDICATOR 8 (GLOVE) ×2
GOWN STRL REUS W/ TWL LRG LVL3 (GOWN DISPOSABLE) ×1 IMPLANT
GOWN STRL REUS W/ TWL XL LVL3 (GOWN DISPOSABLE) ×1 IMPLANT
GOWN STRL REUS W/TWL LRG LVL3 (GOWN DISPOSABLE) ×3
GOWN STRL REUS W/TWL XL LVL3 (GOWN DISPOSABLE) ×3
IMPL S ENDOSKEL TC 7 ODEG (Orthopedic Implant) ×1 IMPLANT
IMPLANT S ENDOSKEL TC 7 ODEG (Orthopedic Implant) ×3 IMPLANT
IV CATH 14GX2 1/4 (CATHETERS) ×3 IMPLANT
KIT BASIN OR (CUSTOM PROCEDURE TRAY) ×3 IMPLANT
KIT ROOM TURNOVER OR (KITS) ×3 IMPLANT
MANIFOLD NEPTUNE II (INSTRUMENTS) ×3 IMPLANT
NEEDLE 27GAX1X1/2 (NEEDLE) ×3 IMPLANT
NEEDLE SPNL 20GX3.5 QUINCKE YW (NEEDLE) ×3 IMPLANT
NS IRRIG 1000ML POUR BTL (IV SOLUTION) ×3 IMPLANT
OIL CARTRIDGE MAESTRO DRILL (MISCELLANEOUS) ×3
PACK ORTHO CERVICAL (CUSTOM PROCEDURE TRAY) ×3 IMPLANT
PAD ARMBOARD 7.5X6 YLW CONV (MISCELLANEOUS) ×6 IMPLANT
PATTIES SURGICAL .5 X.5 (GAUZE/BANDAGES/DRESSINGS) IMPLANT
PATTIES SURGICAL .5 X1 (DISPOSABLE) IMPLANT
PIN DISTRACTION 14 (PIN) ×6 IMPLANT
PLATE SKYLINE 2LVL 26MM (Plate) ×3 IMPLANT
PUTTY BONE DBX 2.5 MIS (Bone Implant) ×3 IMPLANT
SCREW SKYLINE VARIABLE LG (Screw) ×18 IMPLANT
SPONGE GAUZE 4X4 12PLY STER LF (GAUZE/BANDAGES/DRESSINGS) ×3 IMPLANT
SPONGE INTESTINAL PEANUT (DISPOSABLE) ×5 IMPLANT
SPONGE SURGIFOAM ABS GEL 100 (HEMOSTASIS) ×3 IMPLANT
STRIP CLOSURE SKIN 1/2X4 (GAUZE/BANDAGES/DRESSINGS) ×2 IMPLANT
SURGIFLO W/THROMBIN 8M KIT (HEMOSTASIS) IMPLANT
SUT MNCRL AB 4-0 PS2 18 (SUTURE) ×3 IMPLANT
SUT SILK 4 0 (SUTURE)
SUT SILK 4-0 18XBRD TIE 12 (SUTURE) IMPLANT
SUT VIC AB 2-0 CT2 18 VCP726D (SUTURE) ×3 IMPLANT
SYR BULB IRRIGATION 50ML (SYRINGE) ×3 IMPLANT
SYR CONTROL 10ML LL (SYRINGE) ×9 IMPLANT
TAPE CLOTH 4X10 WHT NS (GAUZE/BANDAGES/DRESSINGS) ×3 IMPLANT
TAPE CLOTH SURG 4X10 WHT LF (GAUZE/BANDAGES/DRESSINGS) ×3 IMPLANT
TAPE UMBILICAL COTTON 1/8X30 (MISCELLANEOUS) ×3 IMPLANT
TOWEL OR 17X24 6PK STRL BLUE (TOWEL DISPOSABLE) ×3 IMPLANT
TOWEL OR 17X26 10 PK STRL BLUE (TOWEL DISPOSABLE) ×3 IMPLANT
WATER STERILE IRR 1000ML POUR (IV SOLUTION) ×3 IMPLANT
YANKAUER SUCT BULB TIP NO VENT (SUCTIONS) ×3 IMPLANT

## 2015-12-11 NOTE — Anesthesia Procedure Notes (Signed)
Procedure Name: Intubation Date/Time: 12/11/2015 12:49 PM Performed by: Collier Bullock Pre-anesthesia Checklist: Patient identified, Emergency Drugs available, Suction available, Patient being monitored and Timeout performed Patient Re-evaluated:Patient Re-evaluated prior to inductionOxygen Delivery Method: Circle system utilized Preoxygenation: Pre-oxygenation with 100% oxygen Intubation Type: IV induction Ventilation: Mask ventilation without difficulty Laryngoscope Size: Glidescope and 3 Grade View: Grade I Tube type: Oral Tube size: 7.0 mm Number of attempts: 1 Airway Equipment and Method: Video-laryngoscopy Placement Confirmation: ETT inserted through vocal cords under direct vision,  positive ETCO2 and breath sounds checked- equal and bilateral Secured at: 23 cm Tube secured with: Tape Dental Injury: Teeth and Oropharynx as per pre-operative assessment

## 2015-12-11 NOTE — Transfer of Care (Signed)
Immediate Anesthesia Transfer of Care Note  Patient: [name]  Procedure(s) Performed: Procedure(s) with comments: ANTERIOR CERVICAL DECOMPRESSION/DISCECTOMY FUSION 2 LEVELS (N/A) - Anterior cervical decompression fusion, cervical 6-7, cervical 7-thoracic 1 with instrumentation and allograft  Patient Location: PACU  Anesthesia Type:General  Level of Consciousness: awake, alert  and patient cooperative  Airway & Oxygen Therapy: Patient Spontanous Breathing and Patient connected to face mask oxygen  Post-op Assessment: Report given to RN, Post -op Vital signs reviewed and stable, Patient moving all extremities X 4 and Patient able to stick tongue midline  Post vital signs: Reviewed and stable  Last Vitals:  Filed Vitals:   12/11/15 0931  BP: 104/50  Pulse: 76  Temp: 36.7 C  Resp: 20    Complications: No apparent anesthesia complications

## 2015-12-11 NOTE — Anesthesia Preprocedure Evaluation (Addendum)
Anesthesia Evaluation  Patient identified by MRN, date of birth, ID band Patient awake    Reviewed: Allergy & Precautions, NPO status , Patient's Chart, lab work & pertinent test results  History of Anesthesia Complications (+) Family history of anesthesia reaction and history of anesthetic complications (delayed emergence in relative)  Airway Mallampati: I  TM Distance: >3 FB Neck ROM: Limited    Dental  (+) Teeth Intact, Dental Advisory Given   Pulmonary asthma , former smoker,    Pulmonary exam normal breath sounds clear to auscultation       Cardiovascular hypertension, Pt. on medications Normal cardiovascular exam Rhythm:Regular Rate:Normal     Neuro/Psych  Neuromuscular disease (LUE radiculopathy)    GI/Hepatic Neg liver ROS, GERD  Medicated,  Endo/Other  diabetes, Type 2, Oral Hypoglycemic AgentsObesity   Renal/GU negative Renal ROS     Musculoskeletal  (+) Arthritis , Osteoarthritis,    Abdominal   Peds  Hematology negative hematology ROS (+)   Anesthesia Other Findings Day of surgery medications reviewed with the patient.  Reproductive/Obstetrics                            Anesthesia Physical Anesthesia Plan  ASA: III  Anesthesia Plan: General   Post-op Pain Management:    Induction: Intravenous  Airway Management Planned: Oral ETT  Additional Equipment:   Intra-op Plan:   Post-operative Plan: Extubation in OR  Informed Consent: I have reviewed the patients History and Physical, chart, labs and discussed the procedure including the risks, benefits and alternatives for the proposed anesthesia with the patient or authorized representative who has indicated his/her understanding and acceptance.   Dental advisory given  Plan Discussed with: CRNA  Anesthesia Plan Comments: (Risks/benefits of general anesthesia discussed with patient including risk of damage to teeth,  lips, gum, and tongue, nausea/vomiting, allergic reactions to medications, and the possibility of heart attack, stroke and death.  All patient questions answered.  Patient wishes to proceed.)        Anesthesia Quick Evaluation

## 2015-12-11 NOTE — Op Note (Signed)
NAMESALIMA, [name] NO.:  [id number]  MEDICAL RECORD NO.:  [id number]  LOCATION:  [id number]                        FACILITY:  [facility]  PHYSICIAN:  Phylliss Bob, MD      DATE OF BIRTH:  09/02/1946  DATE OF PROCEDURE:  12/11/2015                              OPERATIVE REPORT   PREOPERATIVE DIAGNOSES: 1. Left-sided cervical radiculopathy. 2. Substantial weakness involving the left hand. 3. Severe left-sided neuroforaminal stenosis, C6-7, C7-T1.  POSTOPERATIVE DIAGNOSES: 1. Left-sided cervical radiculopathy. 2. Substantial weakness involving the left hand. 3. Severe left-sided neuroforaminal stenosis, C6-7, C7-T1.  PROCEDURE: 1. Anterior cervical decompression and fusion C6-7, C7-T1. 2. Placement of anterior instrumentation, C6-T1. 3. Use of morselized allograft-DBX mix. 4. Insertion of interbody device x2 (Titan intervertebral spacers). 5. Intraoperative use of fluoroscopy.  SURGEON:  Phylliss Bob, MD  ASSISTANT:  Pricilla Holm, PA-C  ANESTHESIA:  General endotracheal anesthesia.  COMPLICATIONS:  None.  DISPOSITION:  Stable.  ESTIMATED BLOOD LOSS:  Minimal.  INDICATIONS FOR SURGERY:  Briefly, [name] is a pleasant 70 year old female, who is many years status post a C5-6 ACDF.  The patient did well from that surgery.  However, did subsequently go on to have pain and weakness in her left arm and left hand.  The patient's MRI did reveal the findings reflected above.  Given the patient's ongoing pain and weakness, we did discuss proceeding with the procedure reflected above. The patient did elect to proceed.  OPERATIVE DETAILS:  On December 11, 2015, the patient was brought to surgery and general endotracheal anesthesia was administered.  The patient was placed supine on the hospital bed.  The patient's neck was gently extended.  The patient's arms were secured to her sides and all bony prominences were padded.  The neck was then prepped and  draped, then a right-sided transverse incision was made.  The platysma was incised.  A Smith-Robinson approach was used.  The anterior spine was identified.  A self-retaining retractor was placed.  Caspar pins were then placed into the C7 and T1 vertebral bodies and distraction was applied.  I then proceeded with a thorough C7-T1 intervertebral diskectomy.  The right and left neural foramina were adequately decompressed.  The endplates were then prepared and the appropriate size interbody spacer was packed with DBX mix and tamped into position in the usual fashion.  A diskectomy was then performed at the C6-7 level in the manner previously described.  Once again, a thorough decompression was performed.  After preparing the endplates, the appropriate size intervertebral spacer was packed with DBX mix and tamped into position in the usual fashion.  Bone wax was placed into the Caspar pin holes. The appropriate-sized anterior cervical plate was then placed over the anterior spine.  Four 12 mm variable angle screws were placed, 2 in each vertebral body from C6-T1.  The screws were then locked to the plate using the CAM locking mechanism.  I was very pleased with the final AP and lateral fluoroscopic images.  The wound was then copiously irrigated.  The wound was then closed in layers using 2-0 Vicryl followed by 3-0 Monocryl.  Benzoin and Steri-Strips were applied followed by sterile dressing.  All instrument counts were correct at the termination of the procedure.  Of note, Pricilla Holm, was my assistant throughout surgery, and did aid in retraction, suctioning, and closure from start to finish.     Phylliss Bob, MD     MD/MEDQ  D:  12/11/2015  T:  12/11/2015  Job:  [id number]

## 2015-12-12 ENCOUNTER — Encounter (HOSPITAL_COMMUNITY): Payer: Self-pay | Admitting: Orthopedic Surgery

## 2015-12-12 LAB — GLUCOSE, CAPILLARY: GLUCOSE-CAPILLARY: 121 mg/dL — AB (ref 65–99)

## 2015-12-12 MED FILL — Thrombin For Soln 20000 Unit: CUTANEOUS | Qty: 1 | Status: AC

## 2015-12-12 NOTE — Progress Notes (Signed)
Orthopedic Tech Progress Note Patient Details:  [name] June 18, 1946 [id number]  Ortho Devices Type of Ortho Device: [place] cervical collar Ortho Device/Splint Interventions: Application   [name] 12/12/2015, 8:23 AM

## 2015-12-12 NOTE — Progress Notes (Signed)
Patient alert and oriented, mae's well, voiding adequate amount of urine, swallowing without difficulty, c/o mild pain and medication given prior to discharged.. Patient discharged home with family. Script and discharged instructions given to patient. Patient and family stated understanding of d/c instructions given and has an appointment with MD. 

## 2015-12-12 NOTE — Progress Notes (Signed)
    Patient doing well Patient denies L arm pain Reports decreased numbness in left hand and fingers   Physical Exam: Filed Vitals:   12/11/15 2338 12/12/15 0400  BP: 113/62 119/59  Pulse: 80 77  Temp: 98.2 F (36.8 C) 98.6 F (37 C)  Resp: 18 18    Dressing in place 4-/5 grip on left (baseline) Increased ROM left hand and fingers vs preop  POD #1 s/p C6-T1 ACDF, doing well with increased strength and decreased numbness  - encourage ambulation - Percocet for pain, Valium for muscle spasms - d/c home today with f/u in 2 weeks

## 2015-12-12 NOTE — Anesthesia Postprocedure Evaluation (Signed)
Anesthesia Post Note  Patient: JONISHA LEPPO  Procedure(s) Performed: Procedure(s) (LRB): ANTERIOR CERVICAL DECOMPRESSION/DISCECTOMY FUSION 2 LEVELS (N/A)  Patient location during evaluation: PACU Anesthesia Type: General Level of consciousness: awake and alert Pain management: pain level controlled Vital Signs Assessment: post-procedure vital signs reviewed and stable Respiratory status: spontaneous breathing, nonlabored ventilation, respiratory function stable and patient connected to nasal cannula oxygen Cardiovascular status: blood pressure returned to baseline and stable Postop Assessment: no signs of nausea or vomiting Anesthetic complications: no    Last Vitals:  Filed Vitals:   12/12/15 0400 12/12/15 0807  BP: 119/59 140/71  Pulse: 77 87  Temp: 37 C 36.6 C  Resp: 18 16    Last Pain:  Filed Vitals:   12/12/15 0808  PainSc: Asleep                 Effie Berkshire

## 2015-12-18 DIAGNOSIS — Z981 Arthrodesis status: Secondary | ICD-10-CM | POA: Diagnosis not present

## 2015-12-18 NOTE — Discharge Summary (Signed)
Patient ID: [name] MRN: [id number] DOB/AGE: January 22, 1946 70 y.o.  Admit date: 12/11/2015 Discharge date: 12/12/2015  Admission Diagnoses:  Active Problems:   Radiculopathy   Discharge Diagnoses:  Same  Past Medical History  Diagnosis Date  . Hypertension   . Fibroid   . Ovarian cyst   . GERD (gastroesophageal reflux disease)   . Asthma   . Hyperlipidemia   . Diabetes mellitus     type 2  . Radiculopathy   . Family history of adverse reaction to anesthesia     my first cousin had difficulty waking up   . Wears glasses   . Arthritis     Surgeries: Procedure(s): ANTERIOR CERVICAL DECOMPRESSION/DISCECTOMY FUSION 2 LEVELS C6-T1 on 12/11/2015   Consultants:  None  Discharged Condition: Improved  Hospital Course: [name] is an 70 y.o. female who was admitted 12/11/2015 for operative treatment of radiculopathy. Patient has severe unremitting pain that affects sleep, daily activities, and work/hobbies. After pre-op clearance the patient was taken to the operating room on 12/11/2015 and underwent  Procedure(s): ANTERIOR CERVICAL DECOMPRESSION/DISCECTOMY FUSION 2 LEVELS C6-T1.    Patient was given perioperative antibiotics:  Anti-infectives    Start     Dose/Rate Route Frequency Ordered Stop   12/11/15 2000  ceFAZolin (ANCEF) IVPB 1 g/50 mL premix     1 g 100 mL/hr over 30 Minutes Intravenous Every 8 hours 12/11/15 1821 12/12/15 0403   12/11/15 0945  ceFAZolin (ANCEF) IVPB 2 g/50 mL premix     2 g 100 mL/hr over 30 Minutes Intravenous To [facility] 12/11/15 0927 12/11/15 1235       Patient was given sequential compression devices, early ambulation to prevent DVT.  Patient benefited maximally from hospital stay and there were no complications.    Recent vital signs: BP 140/71 mmHg  Pulse 87  Temp(Src) 97.9 F (36.6 C) (Oral)  Resp 16  Wt 71.668 kg (158 lb)  SpO2 97%  Discharge Medications:     Medication List    TAKE these medications        bacitracin ophthalmic ointment     bumetanide 0.5 MG tablet  Commonly known as:  BUMEX  Take 0.5 mg by mouth 2 (two) times daily.     cholecalciferol 1000 units tablet  Commonly known as:  VITAMIN D  Take 1,000 Units by mouth daily.     diltiazem 120 MG 24 hr capsule  Commonly known as:  CARDIZEM CD  Take 120 mg by mouth daily.     enalapril 20 MG tablet  Commonly known as:  VASOTEC  Take 20 mg by mouth daily.     fluticasone 50 MCG/ACT nasal spray  Commonly known as:  FLONASE  Place 1 spray into both nostrils daily as needed for allergies or rhinitis.     folic acid 1 MG tablet  Commonly known as:  FOLVITE  Take 2 mg by mouth daily.     ketorolac 0.5 % ophthalmic solution  Commonly known as:  ACULAR  1 drop.     lovastatin 40 MG tablet  Commonly known as:  MEVACOR  Take 40 mg by mouth at bedtime.     metFORMIN 500 MG tablet  Commonly known as:  GLUCOPHAGE  Take 500-1,000 mg by mouth 2 (two) times daily with a meal. Take 1 tablet in the morning Take 2 tablets with dinner     methotrexate 2.5 MG tablet  Commonly known as:  RHEUMATREX  Take 12.5  mg by mouth once a week. Caution:Chemotherapy. Protect from light.     multivitamin capsule  Take 1 capsule by mouth daily.     ofloxacin 0.3 % ophthalmic solution  Commonly known as:  OCUFLOX     prednisoLONE acetate 1 % ophthalmic suspension  Commonly known as:  PRED FORTE     ranitidine 300 MG tablet  Commonly known as:  ZANTAC  Take 300 mg by mouth 2 (two) times daily.     ZYRTEC PO  Take 1 tablet by mouth daily as needed. For allergies        Diagnostic Studies: Dg Chest 2 View  12/11/2015  CLINICAL DATA:  Preoperative for cervical fusion. Hypertension and diabetes. EXAM: CHEST  2 VIEW COMPARISON:  06/25/2015 FINDINGS: The lungs appear clear.  Cardiac and mediastinal contours normal. No pleural effusion identified. Right-sided rotator cuff anchors are identified. IMPRESSION: 1. Unremarkable radiographic  appearance of the chest. Electronically Signed   By: Van Clines M.D.   On: 12/11/2015 09:56   Dg Cervical Spine 1 View  12/11/2015  CLINICAL DATA:  Anterior cervical decompression with fusion at C7-T1 EXAM: DG C-ARM 61-120 MIN; DG CERVICAL SPINE - 1 VIEW COMPARISON:  MR cervical spine 10/08/2015 FLUOROSCOPY TIME:  0 minutes 30 seconds Images obtained: 1 FINDINGS: Single intraoperative cross-table lateral image submitted. Lack of adequate landmarks, unable to assign spinal segment levels. Anterior plate with screws at 3 levels of the lower cervical spine, with 2 intervening disc prostheses. Due to superimposition of the shoulders, unable to assess vertebral heights or alignments. IMPRESSION: Limited exam showing anterior fusion across 2 adjacent disc spaces at the lower cervical spine. Electronically Signed   By: Lavonia Dana M.D.   On: 12/11/2015 15:35   Dg C-arm 1-60 Min  12/11/2015  CLINICAL DATA:  Anterior cervical decompression with fusion at C7-T1 EXAM: DG C-ARM 61-120 MIN; DG CERVICAL SPINE - 1 VIEW COMPARISON:  MR cervical spine 10/08/2015 FLUOROSCOPY TIME:  0 minutes 30 seconds Images obtained: 1 FINDINGS: Single intraoperative cross-table lateral image submitted. Lack of adequate landmarks, unable to assign spinal segment levels. Anterior plate with screws at 3 levels of the lower cervical spine, with 2 intervening disc prostheses. Due to superimposition of the shoulders, unable to assess vertebral heights or alignments. IMPRESSION: Limited exam showing anterior fusion across 2 adjacent disc spaces at the lower cervical spine. Electronically Signed   By: Lavonia Dana M.D.   On: 12/11/2015 15:35    Disposition: 01-Home or Self Care   POD #1 s/p C6-T1 ACDF, doing well with increased strength and decreased numbness  - encourage ambulation - Percocet for pain, Valium for muscle spasms -Written scripts for pain signed and in chart -D/C instructions sheet printed and in chart -D/C today    -F/U in office 2 weeks   Signed: Justice Britain 12/18/2015, 11:49 AM

## 2015-12-25 DIAGNOSIS — M5412 Radiculopathy, cervical region: Secondary | ICD-10-CM | POA: Diagnosis not present

## 2016-01-15 ENCOUNTER — Encounter: Payer: Medicare HMO | Admitting: Internal Medicine

## 2016-01-21 DIAGNOSIS — M4802 Spinal stenosis, cervical region: Secondary | ICD-10-CM | POA: Diagnosis not present

## 2016-01-23 ENCOUNTER — Ambulatory Visit (INDEPENDENT_AMBULATORY_CARE_PROVIDER_SITE_OTHER): Payer: Commercial Managed Care - HMO | Admitting: Ophthalmology

## 2016-01-23 DIAGNOSIS — H35371 Puckering of macula, right eye: Secondary | ICD-10-CM

## 2016-01-23 DIAGNOSIS — H43813 Vitreous degeneration, bilateral: Secondary | ICD-10-CM

## 2016-01-23 DIAGNOSIS — E113293 Type 2 diabetes mellitus with mild nonproliferative diabetic retinopathy without macular edema, bilateral: Secondary | ICD-10-CM | POA: Diagnosis not present

## 2016-01-23 DIAGNOSIS — E11319 Type 2 diabetes mellitus with unspecified diabetic retinopathy without macular edema: Secondary | ICD-10-CM | POA: Diagnosis not present

## 2016-01-23 DIAGNOSIS — I10 Essential (primary) hypertension: Secondary | ICD-10-CM

## 2016-01-23 DIAGNOSIS — H35033 Hypertensive retinopathy, bilateral: Secondary | ICD-10-CM

## 2016-01-28 DIAGNOSIS — M4322 Fusion of spine, cervical region: Secondary | ICD-10-CM | POA: Diagnosis not present

## 2016-01-28 DIAGNOSIS — M6281 Muscle weakness (generalized): Secondary | ICD-10-CM | POA: Diagnosis not present

## 2016-01-29 ENCOUNTER — Encounter: Payer: Self-pay | Admitting: Internal Medicine

## 2016-02-04 DIAGNOSIS — M719 Bursopathy, unspecified: Secondary | ICD-10-CM | POA: Diagnosis not present

## 2016-02-04 DIAGNOSIS — M25572 Pain in left ankle and joints of left foot: Secondary | ICD-10-CM | POA: Diagnosis not present

## 2016-02-04 DIAGNOSIS — M2550 Pain in unspecified joint: Secondary | ICD-10-CM | POA: Diagnosis not present

## 2016-02-05 DIAGNOSIS — M6281 Muscle weakness (generalized): Secondary | ICD-10-CM | POA: Diagnosis not present

## 2016-02-05 DIAGNOSIS — M4322 Fusion of spine, cervical region: Secondary | ICD-10-CM | POA: Diagnosis not present

## 2016-02-07 DIAGNOSIS — M6281 Muscle weakness (generalized): Secondary | ICD-10-CM | POA: Diagnosis not present

## 2016-02-07 DIAGNOSIS — M4322 Fusion of spine, cervical region: Secondary | ICD-10-CM | POA: Diagnosis not present

## 2016-02-11 DIAGNOSIS — M6281 Muscle weakness (generalized): Secondary | ICD-10-CM | POA: Diagnosis not present

## 2016-02-11 DIAGNOSIS — M4322 Fusion of spine, cervical region: Secondary | ICD-10-CM | POA: Diagnosis not present

## 2016-02-13 DIAGNOSIS — M6281 Muscle weakness (generalized): Secondary | ICD-10-CM | POA: Diagnosis not present

## 2016-02-13 DIAGNOSIS — M4322 Fusion of spine, cervical region: Secondary | ICD-10-CM | POA: Diagnosis not present

## 2016-02-18 DIAGNOSIS — M6281 Muscle weakness (generalized): Secondary | ICD-10-CM | POA: Diagnosis not present

## 2016-02-18 DIAGNOSIS — M4322 Fusion of spine, cervical region: Secondary | ICD-10-CM | POA: Diagnosis not present

## 2016-02-20 DIAGNOSIS — M4322 Fusion of spine, cervical region: Secondary | ICD-10-CM | POA: Diagnosis not present

## 2016-02-20 DIAGNOSIS — M6281 Muscle weakness (generalized): Secondary | ICD-10-CM | POA: Diagnosis not present

## 2016-02-25 DIAGNOSIS — M4322 Fusion of spine, cervical region: Secondary | ICD-10-CM | POA: Diagnosis not present

## 2016-02-25 DIAGNOSIS — M6281 Muscle weakness (generalized): Secondary | ICD-10-CM | POA: Diagnosis not present

## 2016-02-27 DIAGNOSIS — M6281 Muscle weakness (generalized): Secondary | ICD-10-CM | POA: Diagnosis not present

## 2016-02-27 DIAGNOSIS — M4322 Fusion of spine, cervical region: Secondary | ICD-10-CM | POA: Diagnosis not present

## 2016-03-03 DIAGNOSIS — M4322 Fusion of spine, cervical region: Secondary | ICD-10-CM | POA: Diagnosis not present

## 2016-03-04 DIAGNOSIS — M4322 Fusion of spine, cervical region: Secondary | ICD-10-CM | POA: Diagnosis not present

## 2016-03-04 DIAGNOSIS — M6281 Muscle weakness (generalized): Secondary | ICD-10-CM | POA: Diagnosis not present

## 2016-03-10 DIAGNOSIS — M6281 Muscle weakness (generalized): Secondary | ICD-10-CM | POA: Diagnosis not present

## 2016-03-10 DIAGNOSIS — M4322 Fusion of spine, cervical region: Secondary | ICD-10-CM | POA: Diagnosis not present

## 2016-03-12 DIAGNOSIS — E559 Vitamin D deficiency, unspecified: Secondary | ICD-10-CM | POA: Diagnosis not present

## 2016-03-12 DIAGNOSIS — E1142 Type 2 diabetes mellitus with diabetic polyneuropathy: Secondary | ICD-10-CM | POA: Diagnosis not present

## 2016-03-12 DIAGNOSIS — M1990 Unspecified osteoarthritis, unspecified site: Secondary | ICD-10-CM | POA: Diagnosis not present

## 2016-03-12 DIAGNOSIS — M5412 Radiculopathy, cervical region: Secondary | ICD-10-CM | POA: Diagnosis not present

## 2016-03-12 DIAGNOSIS — M0609 Rheumatoid arthritis without rheumatoid factor, multiple sites: Secondary | ICD-10-CM | POA: Diagnosis not present

## 2016-03-12 DIAGNOSIS — I129 Hypertensive chronic kidney disease with stage 1 through stage 4 chronic kidney disease, or unspecified chronic kidney disease: Secondary | ICD-10-CM | POA: Diagnosis not present

## 2016-03-12 DIAGNOSIS — Z79899 Other long term (current) drug therapy: Secondary | ICD-10-CM | POA: Diagnosis not present

## 2016-03-13 DIAGNOSIS — M4322 Fusion of spine, cervical region: Secondary | ICD-10-CM | POA: Diagnosis not present

## 2016-03-13 DIAGNOSIS — M6281 Muscle weakness (generalized): Secondary | ICD-10-CM | POA: Diagnosis not present

## 2016-03-17 ENCOUNTER — Ambulatory Visit (AMBULATORY_SURGERY_CENTER): Payer: Self-pay

## 2016-03-17 VITALS — Ht <= 58 in | Wt 164.4 lb

## 2016-03-17 DIAGNOSIS — M6281 Muscle weakness (generalized): Secondary | ICD-10-CM | POA: Diagnosis not present

## 2016-03-17 DIAGNOSIS — M4322 Fusion of spine, cervical region: Secondary | ICD-10-CM | POA: Diagnosis not present

## 2016-03-17 DIAGNOSIS — Z86010 Personal history of colonic polyps: Secondary | ICD-10-CM

## 2016-03-17 MED ORDER — NA SULFATE-K SULFATE-MG SULF 17.5-3.13-1.6 GM/177ML PO SOLN: ORAL | Status: DC

## 2016-03-17 NOTE — Progress Notes (Signed)
Per pt, no allergies to soy or egg products.Pt not taking any weight loss meds or using  O2 at home. 

## 2016-03-19 DIAGNOSIS — F17211 Nicotine dependence, cigarettes, in remission: Secondary | ICD-10-CM | POA: Diagnosis not present

## 2016-03-19 DIAGNOSIS — J449 Chronic obstructive pulmonary disease, unspecified: Secondary | ICD-10-CM | POA: Diagnosis not present

## 2016-03-19 DIAGNOSIS — E1122 Type 2 diabetes mellitus with diabetic chronic kidney disease: Secondary | ICD-10-CM | POA: Diagnosis not present

## 2016-03-19 DIAGNOSIS — E785 Hyperlipidemia, unspecified: Secondary | ICD-10-CM | POA: Diagnosis not present

## 2016-03-19 DIAGNOSIS — I129 Hypertensive chronic kidney disease with stage 1 through stage 4 chronic kidney disease, or unspecified chronic kidney disease: Secondary | ICD-10-CM | POA: Diagnosis not present

## 2016-03-20 DIAGNOSIS — M6281 Muscle weakness (generalized): Secondary | ICD-10-CM | POA: Diagnosis not present

## 2016-03-20 DIAGNOSIS — M4322 Fusion of spine, cervical region: Secondary | ICD-10-CM | POA: Diagnosis not present

## 2016-03-24 DIAGNOSIS — M6281 Muscle weakness (generalized): Secondary | ICD-10-CM | POA: Diagnosis not present

## 2016-03-24 DIAGNOSIS — M4322 Fusion of spine, cervical region: Secondary | ICD-10-CM | POA: Diagnosis not present

## 2016-03-31 ENCOUNTER — Ambulatory Visit (AMBULATORY_SURGERY_CENTER): Payer: Commercial Managed Care - HMO | Admitting: Internal Medicine

## 2016-03-31 ENCOUNTER — Encounter: Payer: Self-pay | Admitting: Internal Medicine

## 2016-03-31 VITALS — BP 105/55 | HR 65 | Temp 97.8°F | Resp 12 | Ht <= 58 in | Wt 164.0 lb

## 2016-03-31 DIAGNOSIS — J45909 Unspecified asthma, uncomplicated: Secondary | ICD-10-CM | POA: Diagnosis not present

## 2016-03-31 DIAGNOSIS — K219 Gastro-esophageal reflux disease without esophagitis: Secondary | ICD-10-CM | POA: Diagnosis not present

## 2016-03-31 DIAGNOSIS — I10 Essential (primary) hypertension: Secondary | ICD-10-CM | POA: Diagnosis not present

## 2016-03-31 DIAGNOSIS — E119 Type 2 diabetes mellitus without complications: Secondary | ICD-10-CM | POA: Diagnosis not present

## 2016-03-31 DIAGNOSIS — Z1211 Encounter for screening for malignant neoplasm of colon: Secondary | ICD-10-CM | POA: Diagnosis not present

## 2016-03-31 DIAGNOSIS — Z86010 Personal history of colonic polyps: Secondary | ICD-10-CM | POA: Diagnosis present

## 2016-03-31 LAB — GLUCOSE, CAPILLARY
Glucose-Capillary: 69 mg/dL (ref 65–99)
Glucose-Capillary: 220 mg/dL — ABNORMAL HIGH (ref 65–99)

## 2016-03-31 MED ORDER — SODIUM CHLORIDE 0.9 % IV SOLN: 500.0000 mL | INTRAVENOUS | Status: DC

## 2016-03-31 NOTE — Op Note (Signed)
[facility] Patient Name: [name] Procedure Date: 03/31/2016 9:30 AM MRN: [id number] Endoscopist: Docia Chuck. Henrene Pastor , MD Age: 71 Date of Birth: [birth year] Gender: Female Procedure:                Colonoscopy Indications:              Surveillance: Personal history of adenomatous                            polyps on last colonoscopy > 5 years ago. Index                            exam 2006 with follow-up 2011. Multiple small                            tubular adenomas Medicines:                Monitored Anesthesia Care Procedure:                Pre-Anesthesia Assessment:                           - Prior to the procedure, a History and Physical                            was performed, and patient medications and                            allergies were reviewed. The patient's tolerance of                            previous anesthesia was also reviewed. The risks                            and benefits of the procedure and the sedation                            options and risks were discussed with the patient.                            All questions were answered, and informed consent                            was obtained. Prior Anticoagulants: The patient has                            taken no previous anticoagulant or antiplatelet                            agents. ASA Grade Assessment: II - A patient with                            mild systemic disease. After reviewing the risks  and benefits, the patient was deemed in                            satisfactory condition to undergo the procedure.                           After obtaining informed consent, the colonoscope                            was passed under direct vision. Throughout the                            procedure, the patient's blood pressure, pulse, and                            oxygen saturations were monitored continuously. The                            Model CF-HQ190L  [id number]) scope was introduced                            through the anus and advanced to the the cecum,                            identified by appendiceal orifice and ileocecal                            valve. The ileocecal valve, appendiceal orifice,                            and rectum were photographed. The quality of the                            bowel preparation was excellent. The colonoscopy                            was performed without difficulty. The patient                            tolerated the procedure well. The bowel preparation                            used was SUPREP. Scope In: 9:41:09 AM Scope Out: 9:53:59 AM Scope Withdrawal Time: 0 hours 9 minutes 4 seconds  Total Procedure Duration: 0 hours 12 minutes 50 seconds  Findings:                 Multiple diverticula were found in the left colon.                           The exam was otherwise without abnormality on                            direct and retroflexion views. Complications:  No immediate complications. Estimated blood loss:                            None. Estimated Blood Loss:     Estimated blood loss: none. Recommendation:           - Repeat colonoscopy in 5 years for surveillance.                           - Patient has a contact number available for                            emergencies. The signs and symptoms of potential                            delayed complications were discussed with the                            patient. Return to normal activities tomorrow.                            Written discharge instructions were provided to the                            patient.                           - Resume previous diet.                           - Continue present medications. Docia Chuck. Henrene Pastor, MD 03/31/2016 9:58:09 AM This report has been signed electronically. CC Letter to:             Thayer Jew

## 2016-03-31 NOTE — Patient Instructions (Signed)
YOU HAD AN ENDOSCOPIC PROCEDURE TODAY AT THE [facility]:   Refer to the procedure report that was given to you for any specific questions about what was found during the examination.  If the procedure report does not answer your questions, please call your gastroenterologist to clarify.  If you requested that your care partner not be given the details of your procedure findings, then the procedure report has been included in a sealed envelope for you to review at your convenience later.  YOU SHOULD EXPECT: Some feelings of bloating in the abdomen. Passage of more gas than usual.  Walking can help get rid of the air that was put into your GI tract during the procedure and reduce the bloating. If you had a lower endoscopy (such as a colonoscopy or flexible sigmoidoscopy) you may notice spotting of blood in your stool or on the toilet paper. If you underwent a bowel prep for your procedure, you may not have a normal bowel movement for a few days.  Please Note:  You might notice some irritation and congestion in your nose or some drainage.  This is from the oxygen used during your procedure.  There is no need for concern and it should clear up in a day or so.  SYMPTOMS TO REPORT IMMEDIATELY:   Following lower endoscopy (colonoscopy or flexible sigmoidoscopy):  Excessive amounts of blood in the stool  Significant tenderness or worsening of abdominal pains  Swelling of the abdomen that is new, acute  Fever of 100F or higher    For urgent or emergent issues, a gastroenterologist can be reached at any hour by calling [phone].   DIET: Your first meal following the procedure should be a small meal and then it is ok to progress to your normal diet. Heavy or fried foods are harder to digest and may make you feel nauseous or bloated.  Likewise, meals heavy in dairy and vegetables can increase bloating.  Drink plenty of fluids but you should avoid alcoholic beverages for 24  hours.  ACTIVITY:  You should plan to take it easy for the rest of today and you should NOT DRIVE or use heavy machinery until tomorrow (because of the sedation medicines used during the test).    FOLLOW UP: Our staff will call the number listed on your records the next business day following your procedure to check on you and address any questions or concerns that you may have regarding the information given to you following your procedure. If we do not reach you, we will leave a message.  However, if you are feeling well and you are not experiencing any problems, there is no need to return our call.  We will assume that you have returned to your regular daily activities without incident.  If any biopsies were taken you will be contacted by phone or by letter within the next 1-3 weeks.  Please call us at [phone] if you have not heard about the biopsies in 3 weeks.    SIGNATURES/CONFIDENTIALITY: You and/or your care partner have signed paperwork which will be entered into your electronic medical record.  These signatures attest to the fact that that the information above on your After Visit Summary has been reviewed and is understood.  Full responsibility of the confidentiality of this discharge information lies with you and/or your care-partner.   Information on diverticulosis given to you today 

## 2016-03-31 NOTE — Progress Notes (Signed)
To pacu vss patent aw reprot to rn 

## 2016-04-01 ENCOUNTER — Telehealth: Payer: Self-pay | Admitting: *Deleted

## 2016-04-01 NOTE — Telephone Encounter (Signed)
  Follow up Call-  Call back number 03/31/2016  Post procedure Call Back phone  # [phone]  Permission to leave phone message Yes     Patient questions:  Do you have a fever, pain , or abdominal swelling? No. Pain Score  0 *  Have you tolerated food without any problems? Yes.    Have you been able to return to your normal activities? Yes.    Do you have any questions about your discharge instructions: Diet   No. Medications  No. Follow up visit  No.  Do you have questions or concerns about your Care? No.  Actions: * If pain score is 4 or above: No action needed, pain <4.

## 2016-04-02 DIAGNOSIS — M6281 Muscle weakness (generalized): Secondary | ICD-10-CM | POA: Diagnosis not present

## 2016-04-02 DIAGNOSIS — M4322 Fusion of spine, cervical region: Secondary | ICD-10-CM | POA: Diagnosis not present

## 2016-05-31 IMAGING — CR DG CHEST 2V
2 series · 2 of 2 positions shown · non-contrast
Comparison: 06/25/2015

CLINICAL DATA: Preoperative for cervical fusion. Hypertension and
diabetes.

EXAM:
CHEST  2 VIEW

[w chest pa]
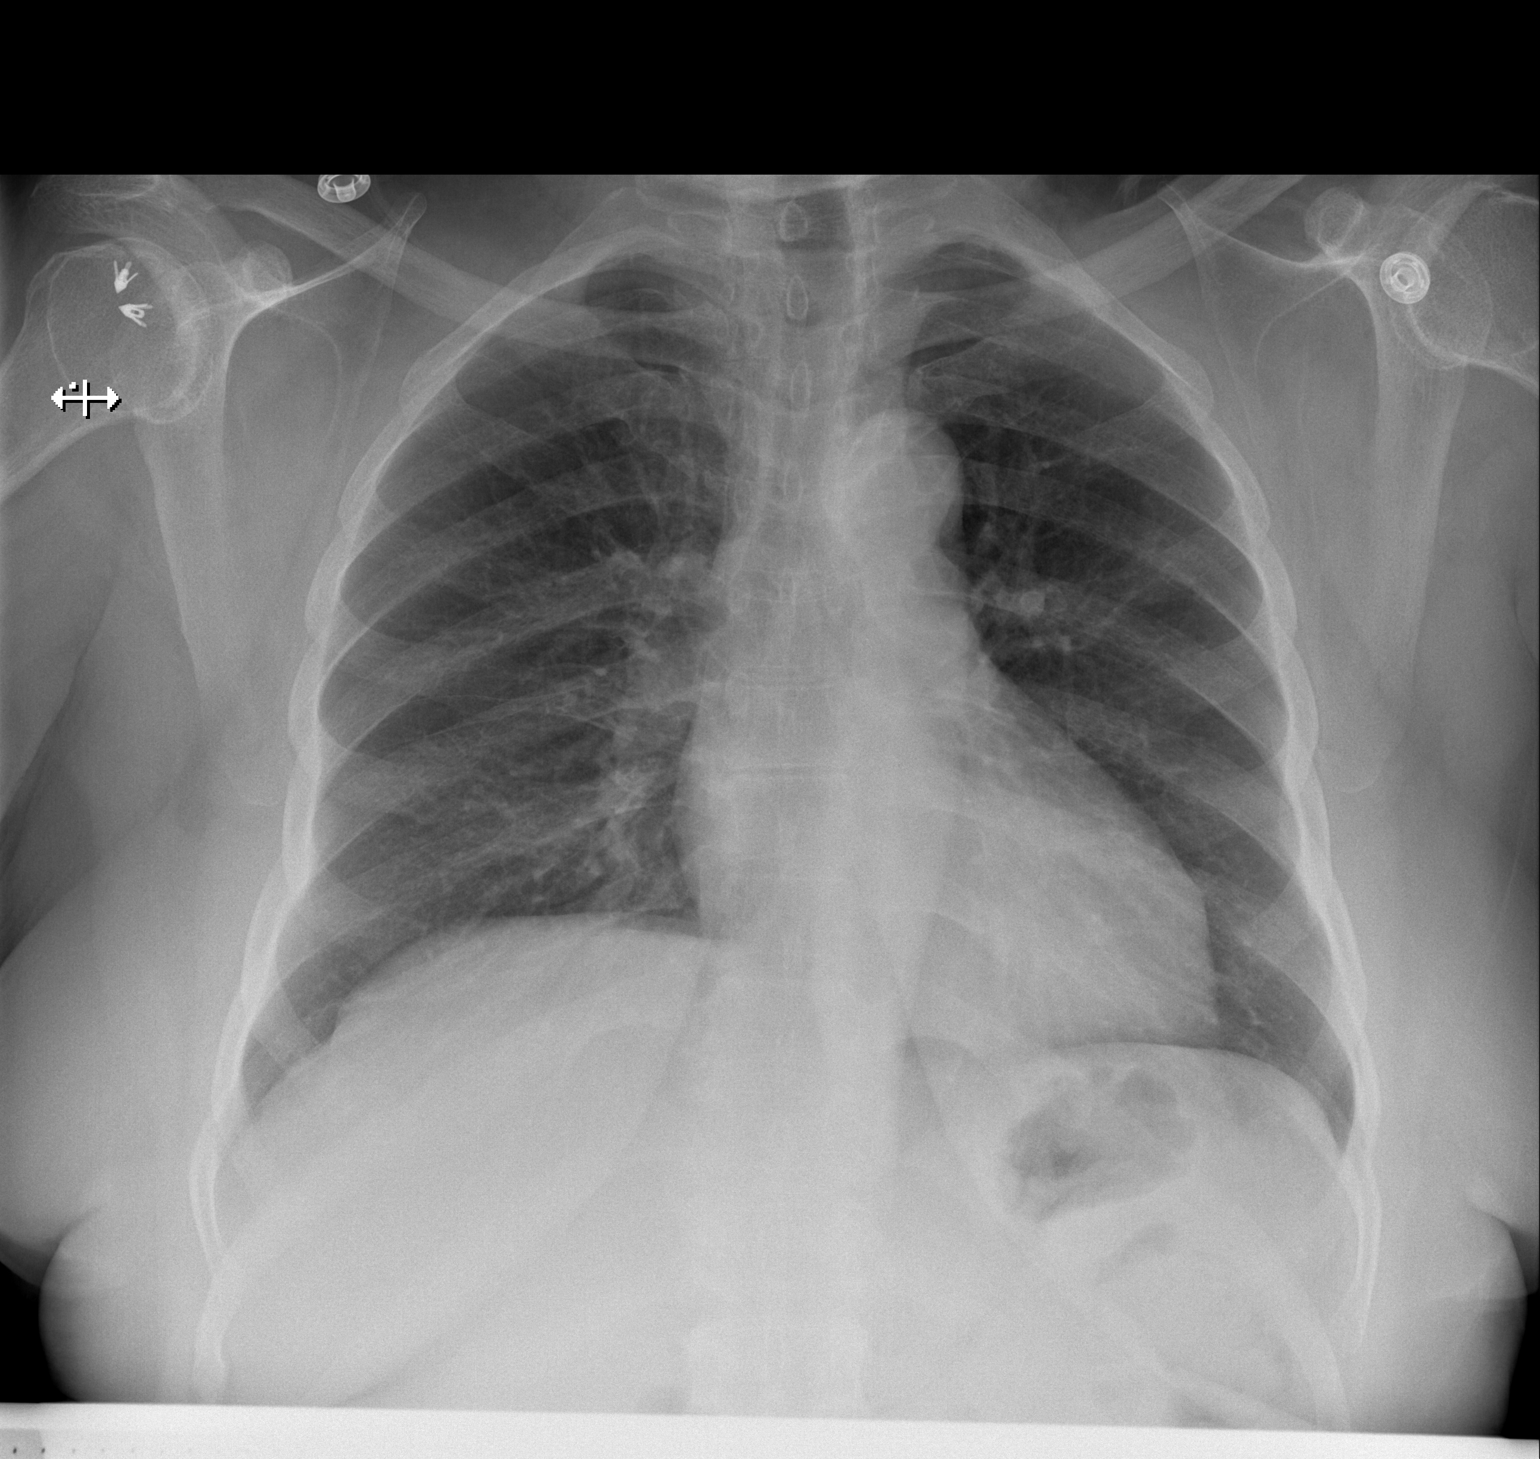

[w chest lat]
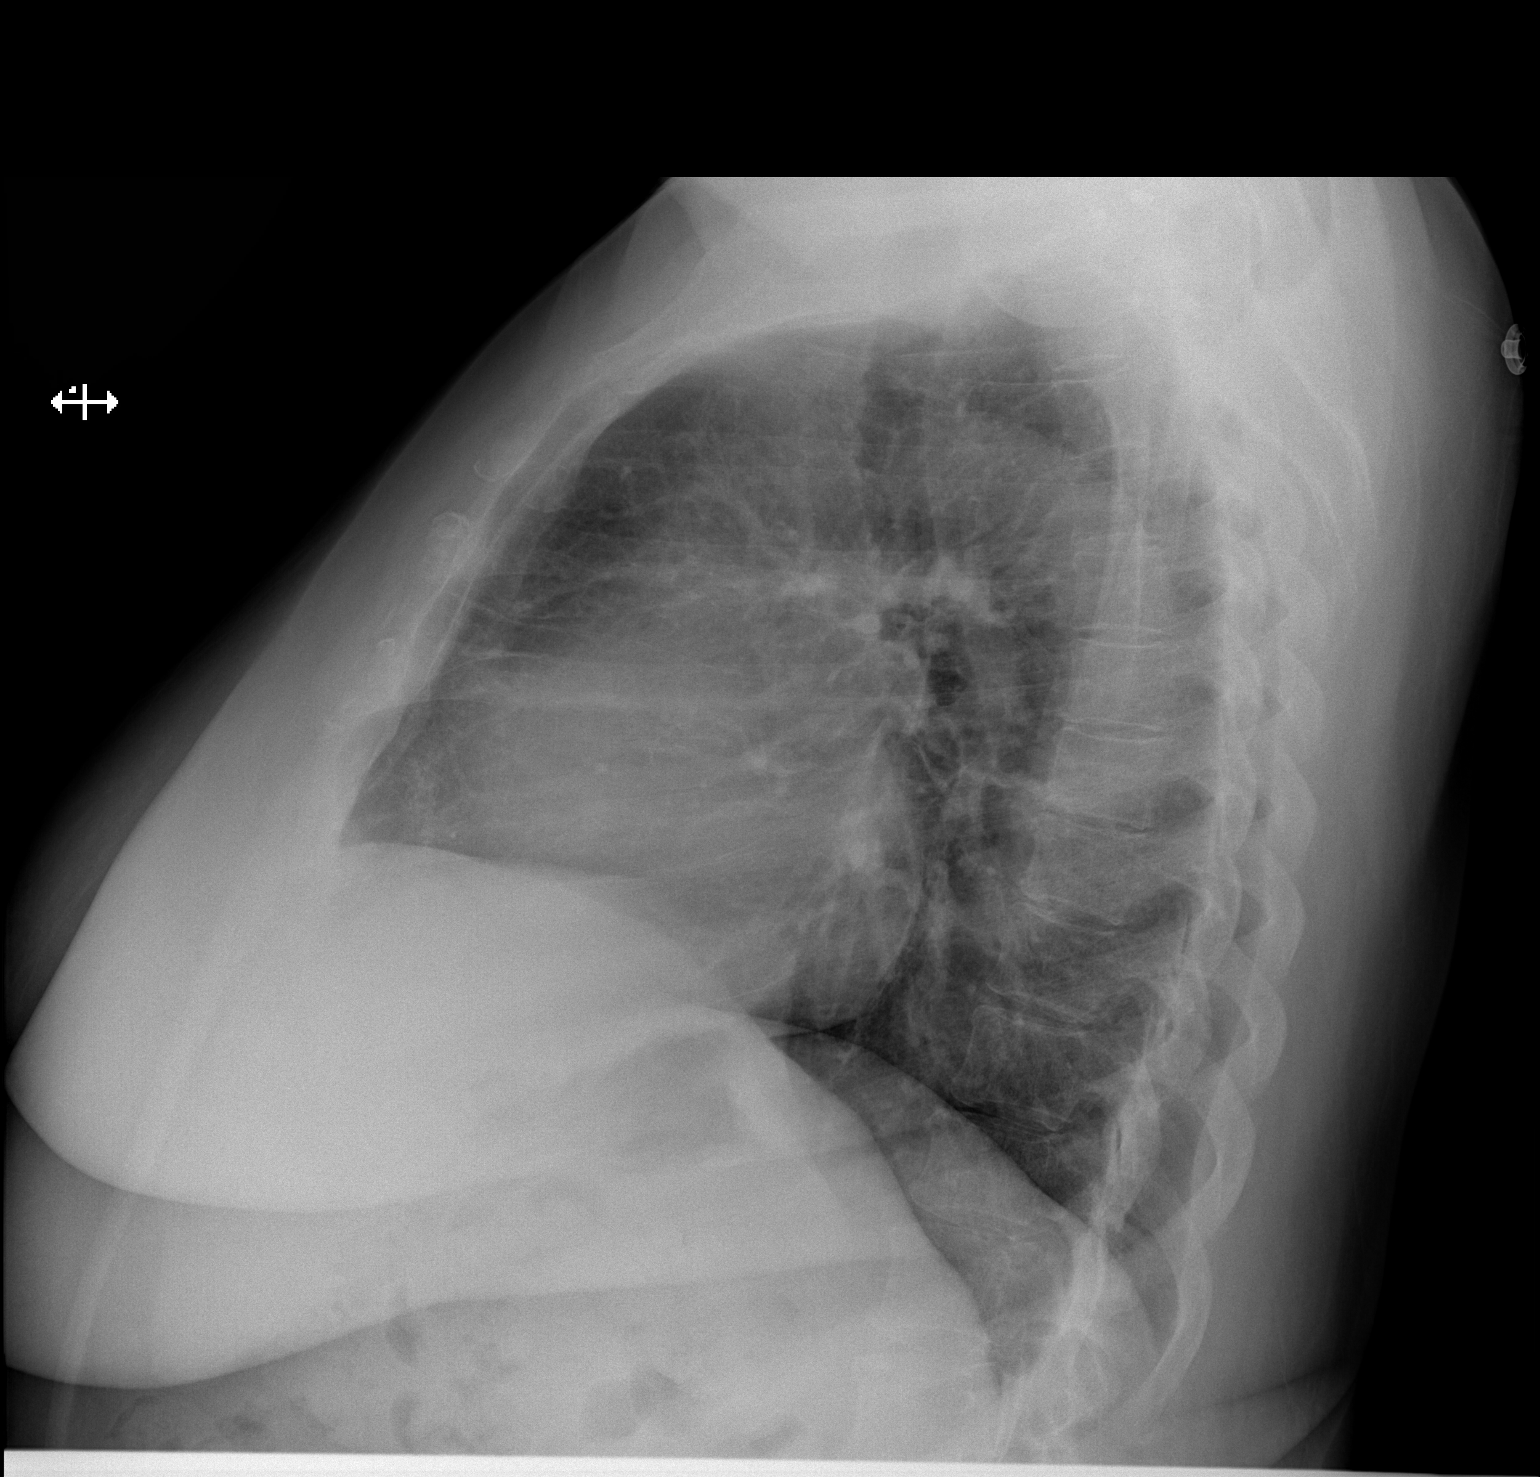

[2 of 2 positions shown; findings below may reference images not displayed]

FINDINGS: The lungs appear clear.  Cardiac and mediastinal contours normal.

No pleural effusion identified.

Right-sided rotator cuff anchors are identified.
IMPRESSION: 1. Unremarkable radiographic appearance of the chest.

## 2016-06-08 DIAGNOSIS — M545 Low back pain: Secondary | ICD-10-CM | POA: Diagnosis not present

## 2016-06-09 DIAGNOSIS — H04123 Dry eye syndrome of bilateral lacrimal glands: Secondary | ICD-10-CM | POA: Diagnosis not present

## 2016-06-09 DIAGNOSIS — E119 Type 2 diabetes mellitus without complications: Secondary | ICD-10-CM | POA: Diagnosis not present

## 2016-06-09 DIAGNOSIS — Z961 Presence of intraocular lens: Secondary | ICD-10-CM | POA: Diagnosis not present

## 2016-06-09 DIAGNOSIS — H40013 Open angle with borderline findings, low risk, bilateral: Secondary | ICD-10-CM | POA: Diagnosis not present

## 2016-06-11 DIAGNOSIS — M5412 Radiculopathy, cervical region: Secondary | ICD-10-CM | POA: Diagnosis not present

## 2016-06-11 DIAGNOSIS — M0609 Rheumatoid arthritis without rheumatoid factor, multiple sites: Secondary | ICD-10-CM | POA: Diagnosis not present

## 2016-06-11 DIAGNOSIS — M1990 Unspecified osteoarthritis, unspecified site: Secondary | ICD-10-CM | POA: Diagnosis not present

## 2016-06-11 DIAGNOSIS — Z79899 Other long term (current) drug therapy: Secondary | ICD-10-CM | POA: Diagnosis not present

## 2016-07-20 DIAGNOSIS — H524 Presbyopia: Secondary | ICD-10-CM | POA: Diagnosis not present

## 2016-07-20 DIAGNOSIS — H5213 Myopia, bilateral: Secondary | ICD-10-CM | POA: Diagnosis not present

## 2016-07-20 DIAGNOSIS — Z0100 Encounter for examination of eyes and vision without abnormal findings: Secondary | ICD-10-CM | POA: Diagnosis not present

## 2016-07-20 DIAGNOSIS — H5203 Hypermetropia, bilateral: Secondary | ICD-10-CM | POA: Diagnosis not present

## 2016-07-20 DIAGNOSIS — H52209 Unspecified astigmatism, unspecified eye: Secondary | ICD-10-CM | POA: Diagnosis not present

## 2016-08-27 DIAGNOSIS — E559 Vitamin D deficiency, unspecified: Secondary | ICD-10-CM | POA: Diagnosis not present

## 2016-08-27 DIAGNOSIS — I129 Hypertensive chronic kidney disease with stage 1 through stage 4 chronic kidney disease, or unspecified chronic kidney disease: Secondary | ICD-10-CM | POA: Diagnosis not present

## 2016-08-27 DIAGNOSIS — E785 Hyperlipidemia, unspecified: Secondary | ICD-10-CM | POA: Diagnosis not present

## 2016-08-27 DIAGNOSIS — M8580 Other specified disorders of bone density and structure, unspecified site: Secondary | ICD-10-CM | POA: Diagnosis not present

## 2016-08-27 DIAGNOSIS — Z0000 Encounter for general adult medical examination without abnormal findings: Secondary | ICD-10-CM | POA: Diagnosis not present

## 2016-08-27 DIAGNOSIS — Z23 Encounter for immunization: Secondary | ICD-10-CM | POA: Diagnosis not present

## 2016-08-27 DIAGNOSIS — Z1389 Encounter for screening for other disorder: Secondary | ICD-10-CM | POA: Diagnosis not present

## 2016-08-27 DIAGNOSIS — Z13820 Encounter for screening for osteoporosis: Secondary | ICD-10-CM | POA: Diagnosis not present

## 2016-08-27 DIAGNOSIS — E1122 Type 2 diabetes mellitus with diabetic chronic kidney disease: Secondary | ICD-10-CM | POA: Diagnosis not present

## 2016-08-28 ENCOUNTER — Other Ambulatory Visit: Payer: Self-pay

## 2016-08-28 NOTE — Patient Outreach (Signed)
[facility]) Care Management  08/28/2016  KINSLEIGH DEERY [birth year] [id number]   Telephonic Screening   Referral Date:  08/20/16 Source:  [facility] Prevent Issue:  DM, HBP, and "irregular heart rhythm"  Outreach call #1 to patient.  Patient not reached.  RN CM left HIPAA compliant voice message with name and number.  RN CM scheduled for next contact call within one week.   Nathaneil Canary, BSN, RN, [facility] Care Management Care Management Coordinator [phone] Direct [phone] Cell [phone] Office [phone] Fax [email or web address]

## 2016-09-02 ENCOUNTER — Ambulatory Visit: Payer: Self-pay

## 2016-09-03 ENCOUNTER — Other Ambulatory Visit: Payer: Self-pay

## 2016-09-03 DIAGNOSIS — E119 Type 2 diabetes mellitus without complications: Secondary | ICD-10-CM | POA: Insufficient documentation

## 2016-09-03 DIAGNOSIS — E1122 Type 2 diabetes mellitus with diabetic chronic kidney disease: Secondary | ICD-10-CM | POA: Diagnosis not present

## 2016-09-03 DIAGNOSIS — I10 Essential (primary) hypertension: Secondary | ICD-10-CM | POA: Insufficient documentation

## 2016-09-03 DIAGNOSIS — N182 Chronic kidney disease, stage 2 (mild): Secondary | ICD-10-CM | POA: Diagnosis not present

## 2016-09-03 DIAGNOSIS — F17211 Nicotine dependence, cigarettes, in remission: Secondary | ICD-10-CM | POA: Diagnosis not present

## 2016-09-03 DIAGNOSIS — J449 Chronic obstructive pulmonary disease, unspecified: Secondary | ICD-10-CM | POA: Diagnosis not present

## 2016-09-03 NOTE — Patient Outreach (Addendum)
[facility]) Care Management  09/03/2016  [name] 09-Oct-1946 [id number]   Telephonic Screening   Referral Date:  08/20/16 Source:  [facility] Prevent Issue:  DM, HBP, and "irregular heart rhythm" APL: yes Insurance: [employer]   Subjective: Outreach call #2 to patient.  Patient  Reached via cell #. Patient states she just completed her annual physical with Dr. Noah Delaine today and got a good report.   Providers: Primary MD: Dr. Thressa Sheller -  last appt: 09/03/2016   next appt: 6 months.  Social: Patient lives in her home with husband and granddaughter.  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  Mobility: ambulates with no assistive devices  Falls: none Pain: none Depression: none  Transportation:  self Caregiver: daughter  Emergency Contact: daughter Advance Directive:  Yes.  Tamala Bari, daughter is Economist. " Thinks" Zacarias Pontes and MD has a copy Consent: yes - Patient states she would like to participate in the Telephonic Disease Management Program  DME: CB[employer] meter and supplies, walker, 4 prong cane, W/C, hospital bed (which is in storage and was obtained for her husband).   Co-morbidities:  Diabetes type 2, HBP, COPD, CKD, RA, CHF, GERD, Osteoarthritis of knee, Neck surgery 12/11/15 Admissions:  0 ER visits: 0  Diabetes A1C: 5.9  08/27/2016  Medications:  Patient taking less than 15 medications  Co-pay cost issues: none Prevnar (PCV13) 09/19/2015 Pneumovax (PPS 08/17/2013 Flu Vaccine 09/12/2015 tDAP Vaccine 08/01/2012   Encounter Medications:  Outpatient Encounter Prescriptions as of 09/03/2016  Medication Sig Note  . bacitracin ophthalmic ointment as needed. Reported on 03/31/2016 06/27/2014: Received from: External Pharmacy  . bumetanide (BUMEX) 0.5 MG tablet Take 0.5 mg by mouth 2 (two) times daily.     . Cetirizine HCl (ZYRTEC PO) Take 1 tablet by mouth daily as needed. For allergies 12/03/2015: Rarely takes  . cholecalciferol (VITAMIN D) 1000 units  tablet Take 1,000 Units by mouth daily.   . diclofenac (VOLTAREN) 75 MG EC tablet Take 75 mg by mouth 2 (two) times daily.   Marland Kitchen diltiazem (CARDIZEM CD) 120 MG 24 hr capsule Take 120 mg by mouth daily.     . enalapril (VASOTEC) 20 MG tablet Take 20 mg by mouth daily.  10/31/2013: Received from: External Pharmacy  . fluticasone (FLONASE) 50 MCG/ACT nasal spray Place 1 spray into both nostrils daily as needed for allergies or rhinitis. Reported on 123XX123   . folic acid (FOLVITE) 1 MG tablet Take 2 mg by mouth daily. Reported on 03/17/2016   . ketorolac (ACULAR) 0.5 % ophthalmic solution 1 drop. Reported on 03/17/2016 06/27/2014: Received from: External Pharmacy  . lovastatin (MEVACOR) 40 MG tablet Take 40 mg by mouth at bedtime.  06/27/2014: Received from: External Pharmacy  . metFORMIN (GLUCOPHAGE) 500 MG tablet Take 500-1,000 mg by mouth 2 (two) times daily with a meal. Take 1 tablet in the morning Take 2 tablets with dinner   . methotrexate (RHEUMATREX) 2.5 MG tablet Take 12.5 mg by mouth once a week. Caution:Chemotherapy. Protect from light. 12/03/2015: sunday  . Multiple Vitamin (MULTIVITAMIN) capsule Take 1 capsule by mouth daily.     Marland Kitchen NASAL SALINE NA Place into the nose as needed.   Marland Kitchen ofloxacin (OCUFLOX) 0.3 % ophthalmic solution Reported on 03/17/2016 06/27/2014: Received from: External Pharmacy  . prednisoLONE acetate (PRED FORTE) 1 % ophthalmic suspension Reported on 03/31/2016 06/27/2014: Received from: External Pharmacy  . ranitidine (ZANTAC) 300 MG tablet Take 300 mg by mouth 2 (two) times daily.  10/31/2013: Received  from: External Pharmacy   No facility-administered encounter medications on file as of 09/03/2016.     Functional Status:  In your present state of health, do you have any difficulty performing the following activities: 09/03/2016 12/06/2015  Hearing? N N  Vision? N N  Difficulty concentrating or making decisions? N N  Walking or climbing stairs? N N  Dressing or bathing? N N   Doing errands, shopping? N N  Preparing Food and eating ? N -  Using the Toilet? N -  In the past six months, have you accidently leaked urine? N -  Do you have problems with loss of bowel control? N -  Managing your Medications? N -  Managing your Finances? N -  Housekeeping or managing your Housekeeping? N -  Some recent data might be hidden    Fall/Depression Screening: PHQ 2/9 Scores 09/03/2016 09/03/2016  PHQ - 2 Score 0 0   Fall Risk  09/03/2016  Falls in the past year? No    Preventives: Hearing: never screened but no issues  Eyes: Dr.Davis, [facility] and Dr. Ronnald Ramp - Last exam 06/09/2016 Dentist:  Once a year Dr. [facility] - patient still has all her teeth Podiatrist: yes on occasion - last appt 4-5 years ago. [facility].  Mammogram: 10/25/2015 Bone Density: unknown Colonoscopy:  03/31/2016  Plan:  [facility] Telephonic Disease Management Referral 09/03/16 RN CM advised in next [facility] scheduled contact call within next 30 days for Telephonic Disease management services.   RN CM advised to please notify MD of any changes in condition prior to scheduled appt's.   RN CM provided contact name and # [phone] or main office # [phone] and 24-hour nurse line # [phone].  RN CM confirmed patient is aware of 911 services for urgent emergency needs.  Nathaneil Canary, BSN, RN, [place] Care Management Care Management Coordinator [phone] Direct [phone] Cell [phone] Office [phone] Fax [email or web address]

## 2016-09-03 NOTE — Addendum Note (Signed)
Addended by: Standley Brooking on: 09/03/2016 02:46 PM   Modules accepted: Orders

## 2016-09-04 ENCOUNTER — Ambulatory Visit: Payer: Self-pay

## 2016-09-07 DIAGNOSIS — R208 Other disturbances of skin sensation: Secondary | ICD-10-CM | POA: Diagnosis not present

## 2016-09-07 DIAGNOSIS — M47816 Spondylosis without myelopathy or radiculopathy, lumbar region: Secondary | ICD-10-CM | POA: Diagnosis not present

## 2016-09-14 ENCOUNTER — Other Ambulatory Visit: Payer: Self-pay

## 2016-09-14 NOTE — Patient Outreach (Signed)
[place]) Care Management  09/14/2016  DARRYLIN DEVALK June 06, 1946 [id number]   Telephone call to patient for initial assessment.  Patient answers.  She states she cannot talk right now but asked for a call another time.    Plan: RN Health Coach will attempt patient again in the month of October and patient agrees.  Jone Baseman, RN, MSN [place] [phone]

## 2016-09-21 ENCOUNTER — Other Ambulatory Visit: Payer: Self-pay

## 2016-09-21 DIAGNOSIS — J449 Chronic obstructive pulmonary disease, unspecified: Secondary | ICD-10-CM | POA: Diagnosis not present

## 2016-09-21 DIAGNOSIS — R05 Cough: Secondary | ICD-10-CM | POA: Diagnosis not present

## 2016-09-21 DIAGNOSIS — J209 Acute bronchitis, unspecified: Secondary | ICD-10-CM | POA: Diagnosis not present

## 2016-09-21 NOTE — Patient Outreach (Signed)
[facility] [facility]) Care Management  [place]  09/21/2016   [name] 1946/04/30 [id number]  Subjective: Telephone call to patient for initial assessment.  Patient reports she has upper respiratory infection and that she will see her someone at her primary care office today.  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 Patient reports that she has had diabetes since 2007 and that she takes metformin. She states that she normally checks her sugar daily in the morning but has not checked in the last week. Discussed importance of checking sugars. Also discussed A1c and maintaining balanced diet to control sugars. She verbalized understanding.  Patient reports some numbness to her feet and that her physician mentioned neuropathy. Discussed with patient neuropathy and some affect of diabetes.  She verbalized understanding.  Will send EMMI information.  Objective:   Encounter Medications:  Outpatient Encounter Prescriptions as of 09/21/2016  Medication Sig Note  . bumetanide (BUMEX) 0.5 MG tablet Take 0.5 mg by mouth 2 (two) times daily.     . Cetirizine HCl (ZYRTEC PO) Take 1 tablet by mouth daily as needed. For allergies 12/03/2015: Rarely takes  . cholecalciferol (VITAMIN D) 1000 units tablet Take 1,000 Units by mouth daily.   . diclofenac (VOLTAREN) 75 MG EC tablet Take 75 mg by mouth 2 (two) times daily.   Marland Kitchen diltiazem (CARDIZEM CD) 120 MG 24 hr capsule Take 120 mg by mouth daily.     . enalapril (VASOTEC) 20 MG tablet Take 20 mg by mouth daily.  10/31/2013: Received from: External Pharmacy  . fluticasone (FLONASE) 50 MCG/ACT nasal spray Place 1 spray into both nostrils daily as needed for allergies or rhinitis. Reported on 123XX123   . folic acid (FOLVITE) 1 MG tablet Take 2 mg by mouth daily. Reported on 03/17/2016   . lovastatin (MEVACOR) 40 MG tablet Take 40 mg by  mouth at bedtime.  06/27/2014: Received from: External Pharmacy  . metFORMIN (GLUCOPHAGE) 500 MG tablet Take 500-1,000 mg by mouth 2 (two) times daily with a meal. Take 1 tablet in the morning Take 2 tablets with dinner 09/21/2016: Patient taking 1 mid morning and two at night.   . methotrexate (RHEUMATREX) 2.5 MG tablet Take 12.5 mg by mouth once a week. Caution:Chemotherapy. Protect from light. 12/03/2015: sunday  . Multiple Vitamin (MULTIVITAMIN) capsule Take 1 capsule by mouth daily.     Marland Kitchen NASAL SALINE NA Place into the nose as needed.   . ranitidine (ZANTAC) 300 MG tablet Take 300 mg by mouth 2 (two) times daily.  10/31/2013: Received from: External Pharmacy  . bacitracin ophthalmic ointment as needed. Reported on 03/31/2016 06/27/2014: Received from: External Pharmacy  . ketorolac (ACULAR) 0.5 % ophthalmic solution 1 drop. Reported on 03/17/2016 06/27/2014: Received from: External Pharmacy  . ofloxacin (OCUFLOX) 0.3 % ophthalmic solution Reported on 03/17/2016 06/27/2014: Received from: External Pharmacy  . prednisoLONE acetate (PRED FORTE) 1 % ophthalmic suspension Reported on 03/31/2016 06/27/2014: Received from: External Pharmacy   No facility-administered encounter medications on file as of 09/21/2016.     Functional Status:  In your present state of health, do you have any difficulty performing the following activities: 09/21/2016 09/03/2016  Hearing? N N  Vision? N N  Difficulty concentrating or making decisions? N N  Walking or climbing stairs? N N  Dressing or bathing? N N  Doing errands, shopping? N N  Preparing Food and eating ? N N  Using the Toilet? N N  In the past six months, have you accidently leaked urine? N N  Do you have problems with loss of bowel control? N N  Managing your Medications? N N  Managing your Finances? N N  Housekeeping or managing your Housekeeping? N N  Some recent data might be hidden    Fall/Depression Screening: PHQ 2/9 Scores 09/21/2016 09/03/2016  09/03/2016  PHQ - 2 Score 0 0 0    Assessment: Patient will benefit from health coach outreach for education and support for diabetes management.  Plan:  [facility] CM Care Plan Problem One   Flowsheet Row Most Recent Value  Care Plan Problem One  Knowledge Deficit Diabetes  Role Documenting the Problem One  Health [place] for Problem One  Active  THN Long Term Goal (31-90 days)  Patient will maintain A1c less than 6.0 within 90 days.  THN Long Term Goal Start Date  09/21/16  Interventions for Problem One Long Term Goal  RN Health Coach discussed with patient importance of maintaining A1c and affects of diabetes  THN CM Short Term Goal #1 (0-30 days)  Patient will report exercising at least 3 days a week within the next 30 days.    THN CM Short Term Goal #1 Start Date  09/21/16  Interventions for Short Term Goal #1  RN Health Coach discussed with patient rationale for exercise in diabetes.  THN CM Short Term Goal #2 (0-30 days)  Patient will report making appointment with podiatrist within 30 days  THN CM Short Term Goal #2 Start Date  09/21/16  Interventions for Short Term Goal #2  [place] discussed importance of using podiatrist to maintain trimming of nails as patient expressed some fear in clipping toenails too low and that she possibly may have neuropathy.     RN Health Coach will provide ongoing education for patient on diabetes through phone calls and sending printed information to patient for further discussion.  RN Health Coach will send welcome packet with consent to patient as well as printed information on diabetes.  RN Health Coach will send initial barriers letter, assessment, and care plan to primary care physician.  RN Health Coach will contact patient in the month of November and patient agrees to next outreach.   Jone Baseman, RN, MSN [place] [phone]

## 2016-09-28 DIAGNOSIS — E118 Type 2 diabetes mellitus with unspecified complications: Secondary | ICD-10-CM | POA: Diagnosis not present

## 2016-09-28 DIAGNOSIS — J40 Bronchitis, not specified as acute or chronic: Secondary | ICD-10-CM | POA: Diagnosis not present

## 2016-10-19 ENCOUNTER — Other Ambulatory Visit: Payer: Self-pay

## 2016-10-19 NOTE — Patient Outreach (Signed)
[facility]) Care Management  [place]  10/19/2016   [name] 01-05-1946 [id number]  Subjective: Telephone call to patient for monthly call. Patient reports she is doing ok. She does report two falls in the last couple of weeks. 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  Patient reports she injured her chin but is better now. Her other fall she reports she did not close cabinet door and fell over the door.  She only reports a bruise on her leg which is now gone. Discussed with patient EMMI fall precautions.  Will send EMMI information to patient.  Patient reports she did receive packet in the mail and is going through it.  Discussed with patient EMMI diabetic information with patient and importance of self care.  She verbalized understanding.    Objective:   Encounter Medications:  Outpatient Encounter Prescriptions as of 10/19/2016  Medication Sig Note  . bacitracin ophthalmic ointment as needed. Reported on 03/31/2016 06/27/2014: Received from: External Pharmacy  . bumetanide (BUMEX) 0.5 MG tablet Take 0.5 mg by mouth 2 (two) times daily.     . Cetirizine HCl (ZYRTEC PO) Take 1 tablet by mouth daily as needed. For allergies 12/03/2015: Rarely takes  . cholecalciferol (VITAMIN D) 1000 units tablet Take 1,000 Units by mouth daily.   . diclofenac (VOLTAREN) 75 MG EC tablet Take 75 mg by mouth 2 (two) times daily.   Marland Kitchen diltiazem (CARDIZEM CD) 120 MG 24 hr capsule Take 120 mg by mouth daily.     . enalapril (VASOTEC) 20 MG tablet Take 20 mg by mouth daily.  10/31/2013: Received from: External Pharmacy  . fluticasone (FLONASE) 50 MCG/ACT nasal spray Place 1 spray into both nostrils daily as needed for allergies or rhinitis. Reported on 3/79/0240   . folic acid (FOLVITE) 1 MG tablet Take 2 mg by mouth daily. Reported on 03/17/2016   . ketorolac  (ACULAR) 0.5 % ophthalmic solution 1 drop. Reported on 03/17/2016 06/27/2014: Received from: External Pharmacy  . lovastatin (MEVACOR) 40 MG tablet Take 40 mg by mouth at bedtime.  06/27/2014: Received from: External Pharmacy  . metFORMIN (GLUCOPHAGE) 500 MG tablet Take 500-1,000 mg by mouth 2 (two) times daily with a meal. Take 1 tablet in the morning Take 2 tablets with dinner 09/21/2016: Patient taking 1 mid morning and two at night.   . methotrexate (RHEUMATREX) 2.5 MG tablet Take 12.5 mg by mouth once a week. Caution:Chemotherapy. Protect from light. 12/03/2015: sunday  . Multiple Vitamin (MULTIVITAMIN) capsule Take 1 capsule by mouth daily.     Marland Kitchen NASAL SALINE NA Place into the nose as needed.   Marland Kitchen ofloxacin (OCUFLOX) 0.3 % ophthalmic solution Reported on 03/17/2016 06/27/2014: Received from: External Pharmacy  . prednisoLONE acetate (PRED FORTE) 1 % ophthalmic suspension Reported on 03/31/2016 06/27/2014: Received from: External Pharmacy  . ranitidine (ZANTAC) 300 MG tablet Take 300 mg by mouth 2 (two) times daily.  10/31/2013: Received from: External Pharmacy   No facility-administered encounter medications on file as of 10/19/2016.     Functional Status:  In your present state of health, do you have any difficulty performing the following activities: 09/21/2016 09/03/2016  Hearing? N N  Vision? N N  Difficulty concentrating or making decisions? N N  Walking or climbing stairs? N N  Dressing or bathing? N N  Doing  errands, shopping? N N  Preparing Food and eating ? N N  Using the Toilet? N N  In the past six months, have you accidently leaked urine? N N  Do you have problems with loss of bowel control? N N  Managing your Medications? N N  Managing your Finances? N N  Housekeeping or managing your Housekeeping? N N  Some recent data might be hidden    Fall/Depression Screening: PHQ 2/9 Scores 10/19/2016 09/21/2016 09/03/2016 09/03/2016  PHQ - 2 Score 0 0 0 0    Assessment:  Patient  continues to benefit from health coach outreach for disease management and support.    Plan:  [facility] CM Care Plan Problem One   Flowsheet Row Most Recent Value  Care Plan Problem One  Knowledge Deficit Diabetes  Role Documenting the Problem One  Health [place] for Problem One  Active  THN Long Term Goal (31-90 days)  Patient will maintain A1c less than 6.0 within 90 days.  THN Long Term Goal Start Date  10/19/16 [goal continued]  Interventions for Problem One Long Term Goal  RN Health Coach reviewed with patient importance of maintaining A1c and affects of diabetes  THN CM Short Term Goal #1 (0-30 days)  Patient will report exercising at least 3 days a week within the next 30 days.    THN CM Short Term Goal #1 Start Date  09/21/16  [facility] CM Short Term Goal #1 Met Date  10/19/16  Interventions for Short Term Goal #1  Patient reports she is exercising.  THN CM Short Term Goal #2 (0-30 days)  Patient will report making appointment with podiatrist within 30 days  THN CM Short Term Goal #2 Start Date  10/19/16  Interventions for Short Term Goal #2  [place] reviewed importance of using podiatrist to maintain trimming of nails as patient expressed some fear in clipping toenails too low and that she possibly may have neuropathy.    THN CM Care Plan Problem Two   Flowsheet Row Most Recent Value  Care Plan Problem Two  Recent Falls  Role Documenting the Problem Two  [place] for Problem Two  Active  THN CM Short Term Goal #1 (0-30 days)  Patient will not have any falls within the next 30 days.  THN CM Short Term Goal #1 Start Date  10/19/16  Interventions for Short Term Goal #2   [place] discussed with patient fall precautions.       RN Health Coach will contact patient in the month of December and patient agrees to next outreach.  Jone Baseman, RN, MSN [place] [phone]

## 2016-10-26 DIAGNOSIS — Z1231 Encounter for screening mammogram for malignant neoplasm of breast: Secondary | ICD-10-CM | POA: Diagnosis not present

## 2016-11-10 ENCOUNTER — Other Ambulatory Visit: Payer: Self-pay

## 2016-11-10 NOTE — Patient Outreach (Signed)
[place]) Care Management  [place]  11/10/2016   [name] [birth year] [id number]  Subjective: Telephone call to patient for monthly call. Patient reports she is doing ok. No recent falls. She does reports that her sugar have been some lower and that the last time she had that they decreased her medications. Discussed with patient hypoglycemia and continuing to monitor sugar and notify physician if she has consistent hypoglycemic episodes. She verbalized understanding.    Objective:   Encounter Medications:  Outpatient Encounter Prescriptions as of 11/10/2016  Medication Sig Note  . bacitracin ophthalmic ointment as needed. Reported on 03/31/2016 06/27/2014: Received from: External Pharmacy  . bumetanide (BUMEX) 0.5 MG tablet Take 0.5 mg by mouth 2 (two) times daily.     . Cetirizine HCl (ZYRTEC PO) Take 1 tablet by mouth daily as needed. For allergies 12/03/2015: Rarely takes  . cholecalciferol (VITAMIN D) 1000 units tablet Take 1,000 Units by mouth daily.   . diclofenac (VOLTAREN) 75 MG EC tablet Take 75 mg by mouth 2 (two) times daily.   Marland Kitchen diltiazem (CARDIZEM CD) 120 MG 24 hr capsule Take 120 mg by mouth daily.     . enalapril (VASOTEC) 20 MG tablet Take 20 mg by mouth daily.  10/31/2013: Received from: External Pharmacy  . fluticasone (FLONASE) 50 MCG/ACT nasal spray Place 1 spray into both nostrils daily as needed for allergies or rhinitis. Reported on 8/76/8115   . folic acid (FOLVITE) 1 MG tablet Take 2 mg by mouth daily. Reported on 03/17/2016   . ketorolac (ACULAR) 0.5 % ophthalmic solution 1 drop. Reported on 03/17/2016 06/27/2014: Received from: External Pharmacy  . lovastatin (MEVACOR) 40 MG tablet Take 40 mg by mouth at bedtime.  06/27/2014: Received from: External Pharmacy  . metFORMIN (GLUCOPHAGE) 500 MG tablet Take 500-1,000 mg by mouth 2 (two) times daily with a meal. Take 1 tablet in the morning Take 2 tablets with dinner 09/21/2016: Patient taking  1 mid morning and two at night.   . methotrexate (RHEUMATREX) 2.5 MG tablet Take 12.5 mg by mouth once a week. Caution:Chemotherapy. Protect from light. 12/03/2015: sunday  . Multiple Vitamin (MULTIVITAMIN) capsule Take 1 capsule by mouth daily.     Marland Kitchen NASAL SALINE NA Place into the nose as needed.   Marland Kitchen ofloxacin (OCUFLOX) 0.3 % ophthalmic solution Reported on 03/17/2016 06/27/2014: Received from: External Pharmacy  . prednisoLONE acetate (PRED FORTE) 1 % ophthalmic suspension Reported on 03/31/2016 06/27/2014: Received from: External Pharmacy  . ranitidine (ZANTAC) 300 MG tablet Take 300 mg by mouth 2 (two) times daily.  10/31/2013: Received from: External Pharmacy   No facility-administered encounter medications on file as of 11/10/2016.     Functional Status:  In your present state of health, do you have any difficulty performing the following activities: 09/21/2016 09/03/2016  Hearing? N N  Vision? N N  Difficulty concentrating or making decisions? N N  Walking or climbing stairs? N N  Dressing or bathing? N N  Doing errands, shopping? N N  Preparing Food and eating ? N N  Using the Toilet? N N  In the past six months, have you accidently leaked urine? N N  Do you have problems with loss of bowel control? N N  Managing your Medications? N N  Managing your Finances? N N  Housekeeping or managing your Housekeeping? N N  Some recent data might be hidden    Fall/Depression Screening: PHQ 2/9 Scores 10/19/2016 09/21/2016 09/03/2016 09/03/2016  PHQ -  2 Score 0 0 0 0    Assessment: Patient continues to benefit from health coach outreach for disease management and support.    Plan:  [facility] CM Care Plan Problem One   Flowsheet Row Most Recent Value  Care Plan Problem One  Knowledge Deficit Diabetes  Role Documenting the Problem One  Health [place] for Problem One  Active  THN Long Term Goal (31-90 days)  Patient will maintain A1c less than 6.0 within 90 days.  THN Long Term Goal  Start Date  11/10/16 [goal continued]  Interventions for Problem One Long Term Goal  RN Health Coach discussed with patient importance of maintaining A1c and affects of diabetes  THN CM Short Term Goal #2 (0-30 days)  Patient will report making appointment with podiatrist within 30 days  THN CM Short Term Goal #2 Start Date  11/10/16  Interventions for Short Term Goal #2  [place] discussed importance of using podiatrist to maintain trimming of nails as patient expressed some fear in clipping toenails too low and that she possibly may have neuropathy.    THN CM Care Plan Problem Two   Flowsheet Row Most Recent Value  Care Plan Problem Two  Recent Falls  Role Documenting the Problem Two  [place] for Problem Two  Active  THN CM Short Term Goal #1 (0-30 days)  Patient will not have any falls within the next 30 days.  THN CM Short Term Goal #1 Start Date  10/19/16  [facility] CM Short Term Goal #1 Met Date   11/10/16  Interventions for Short Term Goal #2   [place] discussed with patient fall precautions.       RN Health Coach will contact patient in the month of January and patient agrees to next outreach.  Jone Baseman, RN, MSN [place] [phone]

## 2016-12-08 ENCOUNTER — Other Ambulatory Visit: Payer: Self-pay

## 2016-12-08 NOTE — Patient Outreach (Signed)
[facility]) Care Management  [place]  12/08/2016   [name] 01-Sep-1946 [id number]  Subjective: Telephone call to patient for monthly call.  Patient reports she is doing ok.  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 Patient reports recently closing her finger in the door. She reports that her skin was broken but she has been caring for it and it is about healed except for some soreness.  She reports that her last blood sugar was 105.  Discussed with patient continuing to maintain diet in order to control her sugars. She verbalized understanding.    Objective:   Encounter Medications:  Outpatient Encounter Prescriptions as of 12/08/2016  Medication Sig Note  . bacitracin ophthalmic ointment as needed. Reported on 03/31/2016 06/27/2014: Received from: External Pharmacy  . bumetanide (BUMEX) 0.5 MG tablet Take 0.5 mg by mouth 2 (two) times daily.     . Cetirizine HCl (ZYRTEC PO) Take 1 tablet by mouth daily as needed. For allergies 12/03/2015: Rarely takes  . cholecalciferol (VITAMIN D) 1000 units tablet Take 1,000 Units by mouth daily.   . diclofenac (VOLTAREN) 75 MG EC tablet Take 75 mg by mouth 2 (two) times daily.   Marland Kitchen diltiazem (CARDIZEM CD) 120 MG 24 hr capsule Take 120 mg by mouth daily.     . enalapril (VASOTEC) 20 MG tablet Take 20 mg by mouth daily.  10/31/2013: Received from: External Pharmacy  . fluticasone (FLONASE) 50 MCG/ACT nasal spray Place 1 spray into both nostrils daily as needed for allergies or rhinitis. Reported on 123XX123   . folic acid (FOLVITE) 1 MG tablet Take 2 mg by mouth daily. Reported on 03/17/2016   . ketorolac (ACULAR) 0.5 % ophthalmic solution 1 drop. Reported on 03/17/2016 06/27/2014: Received from: External Pharmacy  . lovastatin (MEVACOR) 40 MG tablet Take 40 mg by mouth at bedtime.  06/27/2014: Received from: External Pharmacy  . metFORMIN  (GLUCOPHAGE) 500 MG tablet Take 500-1,000 mg by mouth 2 (two) times daily with a meal. Take 1 tablet in the morning Take 2 tablets with dinner 09/21/2016: Patient taking 1 mid morning and two at night.   . methotrexate (RHEUMATREX) 2.5 MG tablet Take 12.5 mg by mouth once a week. Caution:Chemotherapy. Protect from light. 12/03/2015: sunday  . Multiple Vitamin (MULTIVITAMIN) capsule Take 1 capsule by mouth daily.     Marland Kitchen NASAL SALINE NA Place into the nose as needed.   Marland Kitchen ofloxacin (OCUFLOX) 0.3 % ophthalmic solution Reported on 03/17/2016 06/27/2014: Received from: External Pharmacy  . prednisoLONE acetate (PRED FORTE) 1 % ophthalmic suspension Reported on 03/31/2016 06/27/2014: Received from: External Pharmacy  . ranitidine (ZANTAC) 300 MG tablet Take 300 mg by mouth 2 (two) times daily.  10/31/2013: Received from: External Pharmacy   No facility-administered encounter medications on file as of 12/08/2016.     Functional Status:  In your present state of health, do you have any difficulty performing the following activities: 09/21/2016 09/03/2016  Hearing? N N  Vision? N N  Difficulty concentrating or making decisions? N N  Walking or climbing stairs? N N  Dressing or bathing? N N  Doing errands, shopping? N N  Preparing Food and eating ? N N  Using the Toilet? N N  In the past six months, have you accidently leaked urine? N N  Do you have problems with loss of bowel control? N N  Managing your  Medications? N N  Managing your Finances? N N  Housekeeping or managing your Housekeeping? N N  Some recent data might be hidden    Fall/Depression Screening: PHQ 2/9 Scores 12/08/2016 10/19/2016 09/21/2016 09/03/2016 09/03/2016  PHQ - 2 Score 0 0 0 0 0    Assessment: Patient continues to benefit from health coach outreach for disease management and support.    Plan:  [facility] CM Care Plan Problem One   Flowsheet Row Most Recent Value  Care Plan Problem One  Knowledge Deficit Diabetes  Role Documenting  the Problem One  Health [place] for Problem One  Active  THN Long Term Goal (31-90 days)  Patient will maintain A1c less than 6.0 within 90 days.  THN Long Term Goal Start Date  12/08/16 [goal continued]  Interventions for Problem One Long Term Goal  RN Health Coach reviewed with patient importance of maintaining A1c and affects of diabetes  THN CM Short Term Goal #2 (0-30 days)  Patient will report making appointment with podiatrist within 30 days  THN CM Short Term Goal #2 Start Date  12/08/16  Interventions for Short Term Goal #2  [place] reviewed importance of using podiatrist to maintain trimming of nails as patient expressed some fear in clipping toenails too low and that she possibly may have neuropathy.     RN Health Coach will contact patient in the month of February and patient agrees to next outreach.  Jone Baseman, RN, MSN [place] [phone]

## 2016-12-22 DIAGNOSIS — I10 Essential (primary) hypertension: Secondary | ICD-10-CM | POA: Diagnosis not present

## 2016-12-22 DIAGNOSIS — E1122 Type 2 diabetes mellitus with diabetic chronic kidney disease: Secondary | ICD-10-CM | POA: Diagnosis not present

## 2016-12-24 DIAGNOSIS — J111 Influenza due to unidentified influenza virus with other respiratory manifestations: Secondary | ICD-10-CM | POA: Diagnosis not present

## 2017-01-01 ENCOUNTER — Ambulatory Visit: Payer: Self-pay

## 2017-01-05 DIAGNOSIS — J449 Chronic obstructive pulmonary disease, unspecified: Secondary | ICD-10-CM | POA: Diagnosis not present

## 2017-01-05 DIAGNOSIS — I10 Essential (primary) hypertension: Secondary | ICD-10-CM | POA: Diagnosis not present

## 2017-01-05 DIAGNOSIS — E1122 Type 2 diabetes mellitus with diabetic chronic kidney disease: Secondary | ICD-10-CM | POA: Diagnosis not present

## 2017-01-08 DIAGNOSIS — M25511 Pain in right shoulder: Secondary | ICD-10-CM | POA: Diagnosis not present

## 2017-01-08 DIAGNOSIS — M0609 Rheumatoid arthritis without rheumatoid factor, multiple sites: Secondary | ICD-10-CM | POA: Diagnosis not present

## 2017-01-08 DIAGNOSIS — M7551 Bursitis of right shoulder: Secondary | ICD-10-CM | POA: Diagnosis not present

## 2017-01-08 DIAGNOSIS — M19042 Primary osteoarthritis, left hand: Secondary | ICD-10-CM | POA: Diagnosis not present

## 2017-01-08 DIAGNOSIS — M19041 Primary osteoarthritis, right hand: Secondary | ICD-10-CM | POA: Diagnosis not present

## 2017-01-08 DIAGNOSIS — Z79899 Other long term (current) drug therapy: Secondary | ICD-10-CM | POA: Diagnosis not present

## 2017-01-08 DIAGNOSIS — M5412 Radiculopathy, cervical region: Secondary | ICD-10-CM | POA: Diagnosis not present

## 2017-01-08 DIAGNOSIS — M1990 Unspecified osteoarthritis, unspecified site: Secondary | ICD-10-CM | POA: Diagnosis not present

## 2017-01-11 ENCOUNTER — Other Ambulatory Visit: Payer: Self-pay

## 2017-01-11 NOTE — Patient Outreach (Signed)
[facility]) Care Management  [place]  01/11/2017   [name] 1946/09/28 [id number]  Subjective: Telephone call to patient for monthly call.  Patient reports she is doing good.  She reports that she had an injection to her right shoulder due to inflammation.  She states that she has inflammation due to sewing and is ongoing.  Patient states that her sugars went up slightly with the highest being 120.  Patient reports she also had the flu since our last conversation but saw her physician and is much better.  Patient reports she continues to face challenges with caring for her husband.  She states that his doctor has recommended that she join a support group and plans to go to a meeting tonight.  However, she is trying to find the address.  She states that it is the program through [employer].  Health Coach was able to find the information while talking with patient on the phone.  Address given to support group.  She verbalized understanding and states she will check it out tonight.  Patient reports last A1c was 6.0.  Discussed continuing her diabetic diet.  She verbalized understanding.   Objective:   Encounter Medications:  Outpatient Encounter Prescriptions as of 01/11/2017  Medication Sig Note  . bacitracin ophthalmic ointment as needed. Reported on 03/31/2016 06/27/2014: Received from: External Pharmacy  . bumetanide (BUMEX) 0.5 MG tablet Take 0.5 mg by mouth 2 (two) times daily.     . Cetirizine HCl (ZYRTEC PO) Take 1 tablet by mouth daily as needed. For allergies 12/03/2015: Rarely takes  . cholecalciferol (VITAMIN D) 1000 units tablet Take 1,000 Units by mouth daily.   . diclofenac (VOLTAREN) 75 MG EC tablet Take 75 mg by mouth 2 (two) times daily.   Marland Kitchen diltiazem (CARDIZEM CD) 120 MG 24 hr capsule Take 120 mg by mouth daily.     . enalapril (VASOTEC) 20 MG tablet Take 20 mg by mouth daily.  10/31/2013: Received from: External Pharmacy  . fluticasone  (FLONASE) 50 MCG/ACT nasal spray Place 1 spray into both nostrils daily as needed for allergies or rhinitis. Reported on 123XX123   . folic acid (FOLVITE) 1 MG tablet Take 2 mg by mouth daily. Reported on 03/17/2016   . ketorolac (ACULAR) 0.5 % ophthalmic solution 1 drop. Reported on 03/17/2016 06/27/2014: Received from: External Pharmacy  . lovastatin (MEVACOR) 40 MG tablet Take 40 mg by mouth at bedtime.  06/27/2014: Received from: External Pharmacy  . metFORMIN (GLUCOPHAGE) 500 MG tablet Take 500-1,000 mg by mouth 2 (two) times daily with a meal. Take 1 tablet in the morning Take 2 tablets with dinner 09/21/2016: Patient taking 1 mid morning and two at night.   . methotrexate (RHEUMATREX) 2.5 MG tablet Take 12.5 mg by mouth once a week. Caution:Chemotherapy. Protect from light. 12/03/2015: sunday  . Multiple Vitamin (MULTIVITAMIN) capsule Take 1 capsule by mouth daily.     Marland Kitchen NASAL SALINE NA Place into the nose as needed.   Marland Kitchen ofloxacin (OCUFLOX) 0.3 % ophthalmic solution Reported on 03/17/2016 06/27/2014: Received from: External Pharmacy  . prednisoLONE acetate (PRED FORTE) 1 % ophthalmic suspension Reported on 03/31/2016 06/27/2014: Received from: External Pharmacy  . ranitidine (ZANTAC) 300 MG tablet Take 300 mg by mouth 2 (two) times daily.  10/31/2013: Received from: External Pharmacy   No facility-administered encounter medications on file as of 01/11/2017.     Functional Status:  In your present state of health, do you  have any difficulty performing the following activities: 09/21/2016 09/03/2016  Hearing? N N  Vision? N N  Difficulty concentrating or making decisions? N N  Walking or climbing stairs? N N  Dressing or bathing? N N  Doing errands, shopping? N N  Preparing Food and eating ? N N  Using the Toilet? N N  In the past six months, have you accidently leaked urine? N N  Do you have problems with loss of bowel control? N N  Managing your Medications? N N  Managing your Finances? N N   Housekeeping or managing your Housekeeping? N N  Some recent data might be hidden    Fall/Depression Screening: PHQ 2/9 Scores 01/11/2017 12/08/2016 10/19/2016 09/21/2016 09/03/2016 09/03/2016  PHQ - 2 Score 0 0 0 0 0 0    Assessment: Patient continues to benefit from health coach outreach for disease management and support.    Plan:  [facility] CM Care Plan Problem One   Flowsheet Row Most Recent Value  Care Plan Problem One  Knowledge Deficit Diabetes  Role Documenting the Problem One  Health [place] for Problem One  Active  THN Long Term Goal (31-90 days)  Patient will maintain A1c less than 6.0 within 90 days.  THN Long Term Goal Start Date  01/11/17 [goal continued]  Interventions for Problem One Long Term Goal  RN Health Coach reinforced with patient importance of maintaining A1c and affects of diabetes  THN CM Short Term Goal #2 (0-30 days)  Patient will report making appointment with podiatrist within 30 days  [facility] CM Short Term Goal #2 Start Date  12/08/16     RN Health Coach will contact patient in the month of March and patient agrees to next outreach.  Jone Baseman, RN, MSN [place] [phone]

## 2017-01-18 ENCOUNTER — Ambulatory Visit (INDEPENDENT_AMBULATORY_CARE_PROVIDER_SITE_OTHER): Payer: Commercial Managed Care - HMO

## 2017-01-18 ENCOUNTER — Encounter: Payer: Self-pay | Admitting: Podiatry

## 2017-01-18 ENCOUNTER — Ambulatory Visit (INDEPENDENT_AMBULATORY_CARE_PROVIDER_SITE_OTHER): Payer: Commercial Managed Care - HMO | Admitting: Podiatry

## 2017-01-18 VITALS — BP 133/74 | HR 71 | Resp 16

## 2017-01-18 DIAGNOSIS — M79672 Pain in left foot: Secondary | ICD-10-CM | POA: Diagnosis not present

## 2017-01-18 DIAGNOSIS — M2042 Other hammer toe(s) (acquired), left foot: Secondary | ICD-10-CM

## 2017-01-18 DIAGNOSIS — L84 Corns and callosities: Secondary | ICD-10-CM | POA: Diagnosis not present

## 2017-01-18 NOTE — Progress Notes (Signed)
Subjective:     Patient ID: [name], female   DOB: April 30, 1946, 71 y.o.   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 Patient be seen back for [place] to recheck again as needed and may require arthroplasty which I educated her on today

## 2017-01-22 ENCOUNTER — Ambulatory Visit (INDEPENDENT_AMBULATORY_CARE_PROVIDER_SITE_OTHER): Payer: Commercial Managed Care - HMO | Admitting: Ophthalmology

## 2017-01-27 ENCOUNTER — Ambulatory Visit (INDEPENDENT_AMBULATORY_CARE_PROVIDER_SITE_OTHER): Payer: Medicare HMO | Admitting: Ophthalmology

## 2017-01-27 DIAGNOSIS — E11319 Type 2 diabetes mellitus with unspecified diabetic retinopathy without macular edema: Secondary | ICD-10-CM | POA: Diagnosis not present

## 2017-01-27 DIAGNOSIS — I10 Essential (primary) hypertension: Secondary | ICD-10-CM | POA: Diagnosis not present

## 2017-01-27 DIAGNOSIS — E113293 Type 2 diabetes mellitus with mild nonproliferative diabetic retinopathy without macular edema, bilateral: Secondary | ICD-10-CM | POA: Diagnosis not present

## 2017-01-27 DIAGNOSIS — H43813 Vitreous degeneration, bilateral: Secondary | ICD-10-CM

## 2017-01-27 DIAGNOSIS — H35033 Hypertensive retinopathy, bilateral: Secondary | ICD-10-CM | POA: Diagnosis not present

## 2017-02-08 ENCOUNTER — Other Ambulatory Visit: Payer: Self-pay

## 2017-02-08 NOTE — Patient Outreach (Signed)
[facility]) Care Management  [place]  02/08/2017   [name] [birth year] [id number]  Subjective: Telephone call to patient for monthly call.  Patient reports she is doing ok but having some pain in her right shoulder.  She reports that the injection she had did not help. She reports this is the same shoulder that she had a rotator cuff surgery on.  She reports that she will have an MRI on tomorrow.  She reports that she takes tylenol at times for pain and this lessens the pain.  Patient reports that her sugars are doing good.  Her sugar this morning was 91.  Reiterated with patient continuing diabetic diet.  She verbalized understanding.   Objective:   Encounter Medications:  Outpatient Encounter Prescriptions as of 02/08/2017  Medication Sig Note  . bumetanide (BUMEX) 0.5 MG tablet Take 0.5 mg by mouth 2 (two) times daily.     . Cetirizine HCl (ZYRTEC PO) Take 1 tablet by mouth daily as needed. For allergies 12/03/2015: Rarely takes  . cholecalciferol (VITAMIN D) 1000 units tablet Take 1,000 Units by mouth daily.   Marland Kitchen diltiazem (CARDIZEM CD) 120 MG 24 hr capsule Take 120 mg by mouth daily.     . enalapril (VASOTEC) 20 MG tablet Take 20 mg by mouth daily.  10/31/2013: Received from: External Pharmacy  . fluticasone (FLONASE) 50 MCG/ACT nasal spray Place 1 spray into both nostrils daily as needed for allergies or rhinitis. Reported on 2/68/3419   . folic acid (FOLVITE) 1 MG tablet Take 2 mg by mouth daily. Reported on 03/17/2016   . gabapentin (NEURONTIN) 300 MG capsule Take 300 mg by mouth at bedtime.   Marland Kitchen ketorolac (ACULAR) 0.5 % ophthalmic solution 1 drop. Reported on 03/17/2016 06/27/2014: Received from: External Pharmacy  . lovastatin (MEVACOR) 40 MG tablet Take 40 mg by mouth at bedtime.  06/27/2014: Received from: External Pharmacy  . Magnesium 300 MG CAPS Take 300 mg by mouth every other day.   . metFORMIN (GLUCOPHAGE) 500 MG tablet Take 500-1,000 mg by mouth 2  (two) times daily with a meal. Take 1 tablet in the morning Take 2 tablets with dinner 09/21/2016: Patient taking 1 mid morning and two at night.   . methotrexate (RHEUMATREX) 2.5 MG tablet Take 12.5 mg by mouth once a week. Caution:Chemotherapy. Protect from light. 12/03/2015: sunday  . Multiple Vitamin (MULTIVITAMIN) capsule Take 1 capsule by mouth daily.     Marland Kitchen NASAL SALINE NA Place into the nose as needed.   Marland Kitchen ofloxacin (OCUFLOX) 0.3 % ophthalmic solution Reported on 03/17/2016 06/27/2014: Received from: External Pharmacy  . ranitidine (ZANTAC) 300 MG tablet Take 300 mg by mouth 2 (two) times daily.  10/31/2013: Received from: External Pharmacy  . diclofenac (VOLTAREN) 75 MG EC tablet Take 75 mg by mouth 2 (two) times daily.    No facility-administered encounter medications on file as of 02/08/2017.     Functional Status:  In your present state of health, do you have any difficulty performing the following activities: 09/21/2016 09/03/2016  Hearing? N N  Vision? N N  Difficulty concentrating or making decisions? N N  Walking or climbing stairs? N N  Dressing or bathing? N N  Doing errands, shopping? N N  Preparing Food and eating ? N N  Using the Toilet? N N  In the past six months, have you accidently leaked urine? N N  Do you have problems with loss of bowel control? N N  Managing  your Medications? N N  Managing your Finances? N N  Housekeeping or managing your Housekeeping? N N  Some recent data might be hidden    Fall/Depression Screening: PHQ 2/9 Scores 02/08/2017 01/11/2017 12/08/2016 10/19/2016 09/21/2016 09/03/2016 09/03/2016  PHQ - 2 Score 0 0 0 0 0 0 0    Assessment: Patient continues to benefit from health coach outreach for disease management and support.    Plan:  [facility] CM Care Plan Problem One     Most Recent Value  Care Plan Problem One  Knowledge Deficit Diabetes  Role Documenting the Problem One  Health Coach  Care Plan for Problem One  Active  THN Long Term Goal  (31-90 days)  Patient will maintain A1c less than 6.0 within 90 days.  THN Long Term Goal Start Date  02/08/17 [goal continued]  Interventions for Problem One Long Term Goal  RN Health Coach reiterated with patient importance of maintaining A1c and affects of diabetes     RN Health Coach will contact patient in the month of April and patient agrees to next outreach.  Jone Baseman, RN, MSN [place] [phone]

## 2017-02-09 DIAGNOSIS — M25411 Effusion, right shoulder: Secondary | ICD-10-CM | POA: Diagnosis not present

## 2017-02-09 DIAGNOSIS — M19011 Primary osteoarthritis, right shoulder: Secondary | ICD-10-CM | POA: Diagnosis not present

## 2017-02-09 DIAGNOSIS — Z4789 Encounter for other orthopedic aftercare: Secondary | ICD-10-CM | POA: Diagnosis not present

## 2017-02-25 DIAGNOSIS — M1990 Unspecified osteoarthritis, unspecified site: Secondary | ICD-10-CM | POA: Diagnosis not present

## 2017-02-25 DIAGNOSIS — M7551 Bursitis of right shoulder: Secondary | ICD-10-CM | POA: Diagnosis not present

## 2017-02-25 DIAGNOSIS — E559 Vitamin D deficiency, unspecified: Secondary | ICD-10-CM | POA: Diagnosis not present

## 2017-02-25 DIAGNOSIS — I10 Essential (primary) hypertension: Secondary | ICD-10-CM | POA: Diagnosis not present

## 2017-02-25 DIAGNOSIS — M0609 Rheumatoid arthritis without rheumatoid factor, multiple sites: Secondary | ICD-10-CM | POA: Diagnosis not present

## 2017-02-25 DIAGNOSIS — Z79899 Other long term (current) drug therapy: Secondary | ICD-10-CM | POA: Diagnosis not present

## 2017-02-25 DIAGNOSIS — E1122 Type 2 diabetes mellitus with diabetic chronic kidney disease: Secondary | ICD-10-CM | POA: Diagnosis not present

## 2017-02-25 DIAGNOSIS — M5412 Radiculopathy, cervical region: Secondary | ICD-10-CM | POA: Diagnosis not present

## 2017-03-01 ENCOUNTER — Ambulatory Visit: Payer: Medicare HMO | Attending: Rheumatology | Admitting: Physical Therapy

## 2017-03-01 ENCOUNTER — Encounter: Payer: Self-pay | Admitting: Physical Therapy

## 2017-03-01 DIAGNOSIS — M6281 Muscle weakness (generalized): Secondary | ICD-10-CM | POA: Diagnosis not present

## 2017-03-01 DIAGNOSIS — R293 Abnormal posture: Secondary | ICD-10-CM | POA: Insufficient documentation

## 2017-03-01 DIAGNOSIS — M25611 Stiffness of right shoulder, not elsewhere classified: Secondary | ICD-10-CM | POA: Insufficient documentation

## 2017-03-01 NOTE — Therapy (Signed)
[place], [place], [address] Phone: [phone]   Fax:  [phone]  Physical Therapy Evaluation  Patient Details  Name: [name] MRN: [id number] Date of Birth: [birth year] Referring Provider: Dr. Jearld Shines  Encounter Date: 03/01/2017      PT End of Session - 03/01/17 0918    Visit Number 1   Number of Visits 16   PT Start Time 0934   PT Stop Time 1020   PT Time Calculation (min) 46 min   Activity Tolerance Patient tolerated treatment well   Behavior During Therapy [facility] for tasks assessed/performed      Past Medical History:  Diagnosis Date  . Arthritis   . Asthma   . Diabetes mellitus    type 2  . Family history of adverse reaction to anesthesia    my first cousin had difficulty waking up   . Fibroid    ovarian  . GERD (gastroesophageal reflux disease)   . History of staph infection    in hospital for 11 days/ in 2007  . Hyperlipidemia   . Hypertension   . Ovarian cyst   . Radiculopathy   . Wears glasses     Past Surgical History:  Procedure Laterality Date  . ABDOMINAL HYSTERECTOMY  1998   TAH,BSO  . ANTERIOR CERVICAL DECOMP/DISCECTOMY FUSION N/A 12/11/2015   Procedure: ANTERIOR CERVICAL DECOMPRESSION/DISCECTOMY FUSION 2 LEVELS;  Surgeon: Phylliss Bob, MD;  Location: [facility];  Service: Orthopedics;  Laterality: N/A;  Anterior cervical decompression fusion, cervical 6-7, cervical 7-thoracic 1 with instrumentation and allograft  . BACK SURGERY     "NECK DISC" SURGERY  . CARDIAC CATHETERIZATION  1998  . CATARACT EXTRACTION W/ INTRAOCULAR LENS  IMPLANT, BILATERAL  2015   Bil  . COLONOSCOPY    . LAPAROSCOPIC OVARIAN CYSTECTOMY  1970  . ROTATOR CUFF REPAIR  02/2003   right shoulder  . SHOULDER SURGERY  10/2003  . thumb surgery  2010   right thumb  . TONSILLECTOMY    . TUBAL LIGATION      There were no vitals filed for this visit.       Subjective Assessment - 03/01/17  0850    Subjective Pt presents with chronic shoulder pain which has been worsening, about 2 mos ago she had increased.  She cannot sleep on her Rt. side , reaching above shoulder height.  She has bilateral hand and UE weakness which is also chronic.  She had a cortisone injection last week and that helped her a great deal.  That was her 2nd injection.     Pertinent History 2003 Rt. RCR, RA, cervical spine fusion ACDF    Limitations House hold activities;Writing;Other (comment)  sleep, ADLs, grip    Diagnostic tests MRI indicates mild/mod Rt. [place] OA and biceps tenosynovitis   Patient Stated Goals I want to be able to reach up in the kitchen without total pain.    Currently in Pain? Yes   Pain Score 6    Pain Location Shoulder   Pain Orientation Right   Pain Descriptors / Indicators Sore   Pain Type Chronic pain   Pain Onset More than a month ago   Pain Frequency Intermittent   Aggravating Factors  using her Rt. UE    Pain Relieving Factors rest, meds, cortisone , sometimes heat helps   Effect of Pain on Daily Activities does not prevent her from doing what she needs to do, but she cannot always participate  as she would like to    Multiple Pain Sites Yes  has multijoint pain hips, low back and hands             [facility] PT Assessment - 03/01/17 0857      Assessment   Medical Diagnosis Rt. shoulder pain    Referring Provider Dr. Jearld Shines   Onset Date/Surgical Date --  chronic    Hand Dominance Right   Prior Therapy Rt RCR      Precautions   Precautions None     Restrictions   Weight Bearing Restrictions No     Balance Screen   Has the patient fallen in the past 6 months No   Has the patient had a decrease in activity level because of a fear of falling?  No   Is the patient reluctant to leave their home because of a fear of falling?  No     Home Ec[place] residence   Living Arrangements Spouse/significant other;Other relatives   Type of  [place] to enter   Entrance Stairs-Number of Steps 3   Entrance Stairs-Rails Right   Home Layout One level     Prior Function   Level of Independence Independent;Independent with community mobility without device;Independent with homemaking with ambulation     Cognition   Overall Cognitive Status Within Functional Limits for tasks assessed     Observation/Other Assessments   Focus on Therapeutic Outcomes (FOTO)  56%     Observation/Other Assessments-Edema    Edema --  pt reports had some swelling but none current      Sensation   Light Touch Appears Intact     Posture/Postural Control   Posture/Postural Control Postural limitations   Postural Limitations Rounded Shoulders;Forward head;Increased thoracic kyphosis     AROM   Right Shoulder Flexion 110 Degrees   Right Shoulder ABduction 112 Degrees   Right Shoulder Internal Rotation 64 Degrees  Fr to Rt. hip    Right Shoulder External Rotation 54 Degrees  FR behind head    Left Shoulder Flexion 140 Degrees   Left Shoulder ABduction 140 Degrees   Left Shoulder Internal Rotation --  FR to upper lumbar    Right/Left Elbow --  WFL      PROM   Overall PROM Comments pain end range ER and IR      Strength   Right Shoulder Flexion 3+/5   Right Shoulder ABduction 3/5   Right Shoulder Internal Rotation 4+/5   Right Shoulder External Rotation 4+/5   Right Elbow Flexion 4+/5     Palpation   Palpation comment TTP Rt. deltoid and biceps, post aspect into Rt. scapula                    [facility] Adult PT Treatment/Exercise - 03/01/17 0857      Self-Care   RICE no heat with swelling, ice to calm post ex   Other Self-Care Comments  HEP      Shoulder Exercises: Supine   External Rotation AAROM;Right;10 reps   Flexion AAROM;Strengthening;Both;10 reps   Other Supine Exercises chest press cane x 10      Cryotherapy   Number Minutes Cryotherapy 10 Minutes   Cryotherapy Location Shoulder   Type  of Cryotherapy Ice pack                PT Education - 03/01/17 1310    Education provided Yes  Education Details HEP, PT/POC   Person(s) Educated Patient   Methods Explanation;Demonstration;Tactile cues;Verbal cues;Handout   Comprehension Verbalized understanding;Returned demonstration;Need further instruction          PT Short Term Goals - 25-Mar-2017 1316      PT SHORT TERM GOAL #1   Title Pt will be I with initial HEP    Time 3   Period Weeks   Status New     PT SHORT TERM GOAL #2   Title Pt will be able to complete ADLs (dressing, grooming) without increasing pain    Time 3   Period Weeks   Status New           PT Long Term Goals - 03-25-17 1317      PT LONG TERM GOAL #1   Title Pt will improve FOTO score to <35% impaired to demo improvement overall.    Time 6   Period Weeks   Status New     PT LONG TERM GOAL #2   Title Pt will be able to reach up into her cabinets and to sanitize hands in the clinic without pain increase.    Time 6   Period Weeks   Status New     PT LONG TERM GOAL #3   Title Pt will understand posture as it relates to shoulder pain, demo safe lifting techniques.    Time 6   Period Weeks   Status New     PT LONG TERM GOAL #4   Title Pt will complain of only min pain in hands, shoulder with work activities.    Time 6   Period Weeks   Status New     PT LONG TERM GOAL #5   Title Pt will be I with more advanced HEP upon discharge from PT>    Time 6   Period Weeks   Status New               Plan - 03/25/2017 1310    Clinical Impression Statement Pt presents for low complexity eval of Lt shoulder pain which limits her ability to reach, use her hand for work and home tasks. She is having trouble resting at night. She has decent rotator cuff strength, lacks strength and AROM in standing. She understands she will have limited progress due to chronicity but hopes to gain some function and reduce pain to ease home tasks.     Rehab Potential Good   PT Frequency 2x / week   PT Duration 6 weeks   PT Treatment/Interventions ADLs/Self Care Home Management;Electrical Stimulation;Cryotherapy;Functional mobility training;Patient/family education;Passive range of motion;Moist Heat;Ultrasound;Neuromuscular re-education;Therapeutic exercise;Therapeutic activities;Taping   PT Next Visit Plan check HEP and progress, AROM, strength as tol, modalities as needed.    PT Home Exercise Plan supine cane ex , scapular retraction    Consulted and Agree with Plan of Care Patient      Patient will benefit from skilled therapeutic intervention in order to improve the following deficits and impairments:  Decreased mobility, Decreased strength, Postural dysfunction, Impaired flexibility, Hypomobility, Pain, Impaired UE functional use, Increased fascial restricitons, Decreased range of motion  Visit Diagnosis: Abnormal posture  Stiffness of shoulder joint, right  Muscle weakness (generalized)      G-Codes - 03-25-2017 1320    Functional Assessment Tool Used (Outpatient Only) FOTO   Functional Limitation Carrying, moving and handling objects   Carrying, Moving and Handling Objects Current Status (G6269) At least 60 percent but less than 80 percent impaired, limited  or restricted   Carrying, Moving and Handling Objects Goal Status [phone]) At least 40 percent but less than 60 percent impaired, limited or restricted       Problem List Patient Active Problem List   Diagnosis Date Noted  . HTN (hypertension) 09/03/2016  . Diabetes ([place]) 09/03/2016  . Radiculopathy 12/11/2015    Khyra Viscuso 03/01/2017, 1:25 PM  [facility] [address] [name], [place], [address] Phone: [phone]   Fax:  [phone]  Name: [name] MRN: [id number] Date of Birth: 1946-02-22   [name], PT 03/01/17 1:25 PM Phone: [phone] Fax: [phone]

## 2017-03-01 NOTE — Patient Instructions (Signed)
Cane Overhead - Supine  Hold cane at thighs with both hands, extend arms straight over head. Hold _5-10__ seconds. Repeat __10_ times. Do _2__ times per day.  External Rotation (Eccentric), Active-Assist - Supine (Cane)  Lie on back, affected arm out from side, elbow at 90, forearm forward. Use cane to assist in lifting forearm of affected arm to neutral. Slowly lower for 3-5 seconds. _10__ reps per set, _1-2__ sets per day, _5-7__ days per week.   Copyright  VHI. All rights reserved.    Scapular Retraction (Standing)    With arms at sides, pinch shoulder blades together. Repeat ___10_ times per set. Do __1-2__ sets per session. Do _1-2___ sessions per day.  http://orth.exer.us/945   Copyright  VHI. All rights reserved.

## 2017-03-01 NOTE — Addendum Note (Signed)
Addended by: [name] L on: 03/01/2017 02:38 PM   Modules accepted: Orders

## 2017-03-03 DIAGNOSIS — E875 Hyperkalemia: Secondary | ICD-10-CM | POA: Diagnosis not present

## 2017-03-04 ENCOUNTER — Ambulatory Visit: Payer: Medicare HMO | Admitting: Physical Therapy

## 2017-03-04 DIAGNOSIS — E875 Hyperkalemia: Secondary | ICD-10-CM | POA: Diagnosis not present

## 2017-03-04 DIAGNOSIS — E559 Vitamin D deficiency, unspecified: Secondary | ICD-10-CM | POA: Diagnosis not present

## 2017-03-04 DIAGNOSIS — E119 Type 2 diabetes mellitus without complications: Secondary | ICD-10-CM | POA: Diagnosis not present

## 2017-03-04 DIAGNOSIS — I10 Essential (primary) hypertension: Secondary | ICD-10-CM | POA: Diagnosis not present

## 2017-03-04 DIAGNOSIS — E7800 Pure hypercholesterolemia, unspecified: Secondary | ICD-10-CM | POA: Diagnosis not present

## 2017-03-04 DIAGNOSIS — D473 Essential (hemorrhagic) thrombocythemia: Secondary | ICD-10-CM | POA: Diagnosis not present

## 2017-03-08 ENCOUNTER — Other Ambulatory Visit: Payer: Self-pay

## 2017-03-08 NOTE — Patient Outreach (Signed)
[facility]) Care Management  [place]  03/08/2017   [name] 1946/03/01 [id number]  Subjective: Telephone call to patient for monthly call.  Patient reports she is doing fine but has an increased potassium level that her physician is watching.  Patient reports she is receiving therapy for her right shoulder as she has some arthritis in that shoulder.  Patient reports that her A1c was 6.0.  Reviewed with patient continuing her diet in order to control her sugars.  She verbalized understanding.    Objective:   Encounter Medications:  Outpatient Encounter Prescriptions as of 03/08/2017  Medication Sig Note  . bumetanide (BUMEX) 0.5 MG tablet Take 0.5 mg by mouth 2 (two) times daily.     . Cetirizine HCl (ZYRTEC PO) Take 1 tablet by mouth daily as needed. For allergies 12/03/2015: Rarely takes  . cholecalciferol (VITAMIN D) 1000 units tablet Take 1,000 Units by mouth daily.   . diclofenac (VOLTAREN) 75 MG EC tablet Take 75 mg by mouth 2 (two) times daily.   Marland Kitchen diltiazem (CARDIZEM CD) 120 MG 24 hr capsule Take 120 mg by mouth daily.     . enalapril (VASOTEC) 20 MG tablet Take 20 mg by mouth daily.  10/31/2013: Received from: External Pharmacy  . fluticasone (FLONASE) 50 MCG/ACT nasal spray Place 1 spray into both nostrils daily as needed for allergies or rhinitis. Reported on 8/56/3149   . folic acid (FOLVITE) 1 MG tablet Take 2 mg by mouth daily. Reported on 03/17/2016   . gabapentin (NEURONTIN) 300 MG capsule Take 300 mg by mouth at bedtime.   Marland Kitchen ketorolac (ACULAR) 0.5 % ophthalmic solution 1 drop. Reported on 03/17/2016 06/27/2014: Received from: External Pharmacy  . lovastatin (MEVACOR) 40 MG tablet Take 40 mg by mouth at bedtime.  06/27/2014: Received from: External Pharmacy  . Magnesium 300 MG CAPS Take 300 mg by mouth every other day.   . metFORMIN (GLUCOPHAGE) 500 MG tablet Take 500-1,000 mg by mouth 2 (two) times daily with a meal. Take 1 tablet in the  morning Take 2 tablets with dinner 09/21/2016: Patient taking 1 mid morning and two at night.   . methotrexate (RHEUMATREX) 2.5 MG tablet Take 12.5 mg by mouth once a week. Caution:Chemotherapy. Protect from light. 12/03/2015: sunday  . Multiple Vitamin (MULTIVITAMIN) capsule Take 1 capsule by mouth daily.     Marland Kitchen NASAL SALINE NA Place into the nose as needed.   Marland Kitchen ofloxacin (OCUFLOX) 0.3 % ophthalmic solution Reported on 03/17/2016 06/27/2014: Received from: External Pharmacy  . ranitidine (ZANTAC) 300 MG tablet Take 300 mg by mouth 2 (two) times daily.  10/31/2013: Received from: External Pharmacy   No facility-administered encounter medications on file as of 03/08/2017.     Functional Status:  In your present state of health, do you have any difficulty performing the following activities: 09/21/2016 09/03/2016  Hearing? N N  Vision? N N  Difficulty concentrating or making decisions? N N  Walking or climbing stairs? N N  Dressing or bathing? N N  Doing errands, shopping? N N  Preparing Food and eating ? N N  Using the Toilet? N N  In the past six months, have you accidently leaked urine? N N  Do you have problems with loss of bowel control? N N  Managing your Medications? N N  Managing your Finances? N N  Housekeeping or managing your Housekeeping? N N  Some recent data might be hidden    Fall/Depression Screening: PHQ 2/9  Scores 03/08/2017 02/08/2017 01/11/2017 12/08/2016 10/19/2016 09/21/2016 09/03/2016  PHQ - 2 Score 0 0 0 0 0 0 0    Assessment: Patient continues to benefit from health coach outreach for disease management and support.    Plan:  [facility] CM Care Plan Problem One     Most Recent Value  Care Plan Problem One  Knowledge Deficit Diabetes  Role Documenting the Problem One  Health Coach  Care Plan for Problem One  Active  [facility] Long Term Goal (31-90 days)  Patient will maintain A1c less than 6.0 within 90 days.  [facility] Long Term Goal Start Date  03/08/17 [goal continued]   Interventions for Problem One Long Term Goal  RN Health Coach reviewed with patient importance of maintaining A1c and affects of diabetes     RN Health Coach will contact patient in the month of May and patient agrees to next outreach.  Jone Baseman, RN, MSN [place] [phone]

## 2017-03-09 ENCOUNTER — Ambulatory Visit: Payer: Medicare HMO | Admitting: Physical Therapy

## 2017-03-09 DIAGNOSIS — M6281 Muscle weakness (generalized): Secondary | ICD-10-CM

## 2017-03-09 DIAGNOSIS — M25611 Stiffness of right shoulder, not elsewhere classified: Secondary | ICD-10-CM

## 2017-03-09 DIAGNOSIS — R293 Abnormal posture: Secondary | ICD-10-CM | POA: Diagnosis not present

## 2017-03-09 NOTE — Patient Instructions (Signed)
Over Head Pull: Narrow Grip       On back, knees bent, feet flat, band across thighs, elbows straight but relaxed. Pull hands apart (start). Keeping elbows straight, bring arms up and over head, hands toward floor. Keep pull steady on band. Hold momentarily. Return slowly, keeping pull steady, back to start. Repeat __10_ times. Band color ___yellow ___   Side Pull: Double Arm   On back, knees bent, feet flat. Arms perpendicular to body, shoulder level, elbows straight but relaxed. Pull arms out to sides, elbows straight. Resistance band comes across collarbones, hands toward floor. Hold momentarily. Slowly return to starting position. Repeat __10_ times. Band color __Yellow___   Sash   On back, knees bent, feet flat, left hand on left hip, right hand above left. Pull right arm DIAGONALLY (hip to shoulder) across chest. Bring right arm along head toward floor. Hold momentarily. Slowly return to starting position. Repeat ___10  times. Do with left arm. Band color ___yellow___   Shoulder Rotation: Double Arm   On back, knees bent, feet flat, elbows tucked at sides, bent 90, hands palms up. Pull hands apart and down toward floor, keeping elbows near sides. Hold momentarily. Slowly return to starting position. Repeat __10_ times. Band color __Yellow ____

## 2017-03-09 NOTE — Therapy (Signed)
[place], [place], [address] Phone: [phone]   Fax:  [phone]  Physical Therapy Treatment  Patient Details  Name: [name] MRN: [id number] Date of Birth: 1946/01/14 Referring Provider: Dr. Jearld Shines  Encounter Date: 03/09/2017      PT End of Session - 03/09/17 0905    Visit Number 2   Number of Visits 12   PT Start Time 4268   PT Stop Time 0940   PT Time Calculation (min) 45 min   Activity Tolerance Patient tolerated treatment well   Behavior During Therapy [facility] for tasks assessed/performed      Past Medical History:  Diagnosis Date  . Arthritis   . Asthma   . Diabetes mellitus    type 2  . Family history of adverse reaction to anesthesia    my first cousin had difficulty waking up   . Fibroid    ovarian  . GERD (gastroesophageal reflux disease)   . History of staph infection    in hospital for 11 days/ in 2007  . Hyperlipidemia   . Hypertension   . Ovarian cyst   . Radiculopathy   . Wears glasses     Past Surgical History:  Procedure Laterality Date  . ABDOMINAL HYSTERECTOMY  1998   TAH,BSO  . ANTERIOR CERVICAL DECOMP/DISCECTOMY FUSION N/A 12/11/2015   Procedure: ANTERIOR CERVICAL DECOMPRESSION/DISCECTOMY FUSION 2 LEVELS;  Surgeon: Phylliss Bob, MD;  Location: [facility];  Service: Orthopedics;  Laterality: N/A;  Anterior cervical decompression fusion, cervical 6-7, cervical 7-thoracic 1 with instrumentation and allograft  . BACK SURGERY     "NECK DISC" SURGERY  . CARDIAC CATHETERIZATION  1998  . CATARACT EXTRACTION W/ INTRAOCULAR LENS  IMPLANT, BILATERAL  2015   Bil  . COLONOSCOPY    . LAPAROSCOPIC OVARIAN CYSTECTOMY  1970  . ROTATOR CUFF REPAIR  02/2003   right shoulder  . SHOULDER SURGERY  10/2003  . thumb surgery  2010   right thumb  . TONSILLECTOMY    . TUBAL LIGATION      There were no vitals filed for this visit.      Subjective Assessment - 03/09/17 0901     Subjective Pt running late.  Less pain overall, better ability to reach.  Thinks it is the ice and some of the stretches.    Currently in Pain? Yes   Pain Score 3    Pain Location Shoulder   Pain Orientation Right;Left   Pain Descriptors / Indicators Sore   Pain Type Chronic pain   Pain Onset More than a month ago   Pain Frequency Intermittent   Aggravating Factors  over doing it    Pain Relieving Factors rest, meds heat                          [facility] Adult PT Treatment/Exercise - 03/09/17 0906      Shoulder Exercises: Supine   Horizontal ABduction Strengthening;Both;10 reps   Theraband Level (Shoulder Horizontal ABduction) Level 1 (Yellow)   External Rotation AAROM;Right;10 reps   Flexion AAROM;Strengthening;Both;10 reps   Other Supine Exercises chest press cane x 10      Shoulder Exercises: Pulleys   Flexion 3 minutes     Shoulder Exercises: ROM/Strengthening   Other ROM/Strengthening Exercises yellow band x 10 ER and IR scapular retraction in supine    Other ROM/Strengthening Exercises scapular stab yellow overhead pull x 10  Cryotherapy   Number Minutes Cryotherapy 8 Minutes   Cryotherapy Location Shoulder   Type of Cryotherapy Ice pack                PT Education - 03/09/17 0905    Education provided Yes   Education Details HEP reinforcement, light bands    Person(s) Educated Patient   Methods Explanation   Comprehension Verbalized understanding          PT Short Term Goals - 03/09/17 1220      PT SHORT TERM GOAL #1   Title Pt will be I with initial HEP    Status Achieved     PT SHORT TERM GOAL #2   Title Pt will be able to complete ADLs (dressing, grooming) without increasing pain    Status On-going           PT Long Term Goals - 03/09/17 1220      PT LONG TERM GOAL #1   Title Pt will improve FOTO score to <35% impaired to demo improvement overall.    Status Unable to assess     PT LONG TERM GOAL #2   Title Pt  will be able to reach up into her cabinets and to sanitize hands in the clinic without pain increase.    Baseline did hand gel today   Status Partially Met     PT LONG TERM GOAL #3   Title Pt will understand posture as it relates to shoulder pain, demo safe lifting techniques.    Status On-going     PT LONG TERM GOAL #4   Title Pt will complain of only min pain in hands, shoulder with work activities.    Status On-going     PT LONG TERM GOAL #5   Title Pt will be I with more advanced HEP upon discharge from PT>    Status On-going               Plan - 03/09/17 1218    Clinical Impression Statement Pt with improvement in function.  Given HEP for strengthening today, no increase in pain.  She would like to reduce frequency to 1 time per week due to finances.     PT Next Visit Plan progress AROM, strength as tol, modalities as needed.    PT Home Exercise Plan supine cane ex , scapular retraction , yellow band for supine scapular stab   Consulted and Agree with Plan of Care Patient      Patient will benefit from skilled therapeutic intervention in order to improve the following deficits and impairments:  Decreased mobility, Decreased strength, Postural dysfunction, Impaired flexibility, Hypomobility, Pain, Impaired UE functional use, Increased fascial restricitons, Decreased range of motion  Visit Diagnosis: Abnormal posture  Stiffness of shoulder joint, right  Muscle weakness (generalized)     Problem List Patient Active Problem List   Diagnosis Date Noted  . HTN (hypertension) 09/03/2016  . Diabetes ([place]) 09/03/2016  . Radiculopathy 12/11/2015    Zymarion Favorite 03/09/2017, 12:22 PM  [facility] [address] [place], [place], [address] Phone: [phone]   Fax:  [phone]  Name: [name] MRN: [id number] Date of Birth: [birth year]   [name], PT 03/09/17 12:23 PM Phone: [phone] Fax:  [phone]

## 2017-03-16 ENCOUNTER — Encounter: Payer: Self-pay | Admitting: Physical Therapy

## 2017-03-16 ENCOUNTER — Ambulatory Visit: Payer: Medicare HMO | Admitting: Physical Therapy

## 2017-03-16 DIAGNOSIS — M6281 Muscle weakness (generalized): Secondary | ICD-10-CM | POA: Diagnosis not present

## 2017-03-16 DIAGNOSIS — M25611 Stiffness of right shoulder, not elsewhere classified: Secondary | ICD-10-CM | POA: Diagnosis not present

## 2017-03-16 DIAGNOSIS — R293 Abnormal posture: Secondary | ICD-10-CM | POA: Diagnosis not present

## 2017-03-16 NOTE — Therapy (Addendum)
[place], [place], [address] Phone: [phone]   Fax:  [phone]  Physical Therapy Treatment  Patient Details  Name: [name] MRN: [id number] Date of Birth: 02-04-1946 Referring Provider: Dr. Jearld Shines  Encounter Date: 03/16/2017      PT End of Session - 03/16/17 0854    Visit Number 3   Number of Visits 12   PT Start Time 9379   PT Stop Time 0935   PT Time Calculation (min) 48 min   Activity Tolerance Patient tolerated treatment well   Behavior During Therapy [facility] for tasks assessed/performed      Past Medical History:  Diagnosis Date  . Arthritis   . Asthma   . Diabetes mellitus    type 2  . Family history of adverse reaction to anesthesia    my first cousin had difficulty waking up   . Fibroid    ovarian  . GERD (gastroesophageal reflux disease)   . History of staph infection    in hospital for 11 days/ in 2007  . Hyperlipidemia   . Hypertension   . Ovarian cyst   . Radiculopathy   . Wears glasses     Past Surgical History:  Procedure Laterality Date  . ABDOMINAL HYSTERECTOMY  1998   TAH,BSO  . ANTERIOR CERVICAL DECOMP/DISCECTOMY FUSION N/A 12/11/2015   Procedure: ANTERIOR CERVICAL DECOMPRESSION/DISCECTOMY FUSION 2 LEVELS;  Surgeon: Phylliss Bob, MD;  Location: [facility];  Service: Orthopedics;  Laterality: N/A;  Anterior cervical decompression fusion, cervical 6-7, cervical 7-thoracic 1 with instrumentation and allograft  . BACK SURGERY     "NECK DISC" SURGERY  . CARDIAC CATHETERIZATION  1998  . CATARACT EXTRACTION W/ INTRAOCULAR LENS  IMPLANT, BILATERAL  2015   Bil  . COLONOSCOPY    . LAPAROSCOPIC OVARIAN CYSTECTOMY  1970  . ROTATOR CUFF REPAIR  02/2003   right shoulder  . SHOULDER SURGERY  10/2003  . thumb surgery  2010   right thumb  . TONSILLECTOMY    . TUBAL LIGATION      There were no vitals filed for this visit.      Subjective Assessment - 03/16/17 0851     Subjective The rain is not good for my shoulders.  It takes more effort for me to raise my arm.  I can take alot of pain. Finished doing a big job yesterday (alteration)    Currently in Pain? Yes   Pain Score 5    Pain Location Shoulder   Pain Orientation Right   Pain Descriptors / Indicators Aching;Throbbing   Pain Type Chronic pain   Pain Onset More than a month ago   Pain Frequency Intermittent   Aggravating Factors  cold rainy weather   Pain Relieving Factors rest, meds, heat             [facility] Adult PT Treatment/Exercise - 03/16/17 0901      Shoulder Exercises: Supine   Horizontal ABduction Strengthening;Both;10 reps   Theraband Level (Shoulder Horizontal ABduction) Level 1 (Yellow)   External Rotation Strengthening;Both;20 reps;Theraband   Theraband Level (Shoulder External Rotation) Level 1 (Yellow)   Other Supine Exercises scapular retraction      Shoulder Exercises: Standing   Extension Strengthening;Both;10 reps   Theraband Level (Shoulder Extension) Level 2 (Red)   Row Strengthening;Both;20 reps   Theraband Level (Shoulder Row) Level 2 (Red)   Other Standing Exercises corner stretch 30 sec x 3      Shoulder Exercises:  Pulleys   Flexion 2 minutes   ABduction 2 minutes     Shoulder Exercises: ROM/Strengthening   UBE (Upper Arm Bike) level 1 in reverse, 5 min, fatigued   Other ROM/Strengthening Exercises diagonal pull x 20 yellow band in supine    Other ROM/Strengthening Exercises scapular stab yellow overhead pull x 10      Cryotherapy   Number Minutes Cryotherapy 8 Minutes   Cryotherapy Location Shoulder   Type of Cryotherapy Ice pack                PT Education - 03/16/17 0853    Education provided Yes   Education Details stress and pain , ice after working, HEP for standing scap   Person(s) Educated Patient   Methods Explanation   Comprehension Verbalized understanding          PT Short Term Goals - 03/16/17 2109      PT SHORT TERM  GOAL #1   Title Pt will be I with initial HEP    Status Achieved     PT SHORT TERM GOAL #2   Title Pt will be able to complete ADLs (dressing, grooming) without increasing pain    Status On-going           PT Long Term Goals - 03/09/17 1220      PT LONG TERM GOAL #1   Title Pt will improve FOTO score to <35% impaired to demo improvement overall.    Status Unable to assess     PT LONG TERM GOAL #2   Title Pt will be able to reach up into her cabinets and to sanitize hands in the clinic without pain increase.    Baseline did hand gel today   Status Partially Met     PT LONG TERM GOAL #3   Title Pt will understand posture as it relates to shoulder pain, demo safe lifting techniques.    Status On-going     PT LONG TERM GOAL #4   Title Pt will complain of only min pain in hands, shoulder with work activities.    Status On-going     PT LONG TERM GOAL #5   Title Pt will be I with more advanced HEP upon discharge from PT>    Status On-going       G Code:  Carrying objects: Current 60-80 CL  Goal 40-60 Discharge 60-80 CL        Plan - 03/16/17 0930    Clinical Impression Statement Patient with a flare up, able to do all exercises without lasting pain increase.  Progressing towards goals.     PT Next Visit Plan progress AROM, strength as tol, modalities as needed. check HEP , UBE in reverse , consider ionto, soft tissue    PT Home Exercise Plan supine cane ex , scapular retraction , yellow band for supine scapular stab, standing row and ext, corner stretch    Consulted and Agree with Plan of Care Patient      Patient will benefit from skilled therapeutic intervention in order to improve the following deficits and impairments:  Decreased mobility, Decreased strength, Postural dysfunction, Impaired flexibility, Hypomobility, Pain, Impaired UE functional use, Increased fascial restricitons, Decreased range of motion  Visit Diagnosis: Stiffness of shoulder joint,  right  Muscle weakness (generalized)  Abnormal posture     Problem List Patient Active Problem List   Diagnosis Date Noted  . HTN (hypertension) 09/03/2016  . Diabetes ([place]) 09/03/2016  . Radiculopathy 12/11/2015  PAA,JENNIFER 03/16/2017, 9:09 PM  [facility] [address] [place], [place], [address] Phone: [phone]   Fax:  [phone]  Name: MAYANA IRIGOYEN MRN: [id number] Date of Birth: 06-21-1946  [name], PT 03/16/17 9:10 PM Phone: [phone] Fax: [phone]   PHYSICAL THERAPY DISCHARGE SUMMARY  Visits from Start of Care: 3  Current functional level related to goals / functional outcomes: See above for most recent info   Remaining deficits: Unknown as of today.    Education / Equipment: HEP. Posture, [place]  Plan: Patient agrees to discharge.  Patient goals were not met. Patient is being discharged due to financial reasons.  ?????    [name], PT 04/29/17 1:24 PM Phone: [phone] Fax: [phone]

## 2017-03-16 NOTE — Patient Instructions (Signed)
Low Row: Standing    Face anchor, feet shoulder width apart. Palms up, pull arms back, squeezing shoulder blades together. Repeat _10-20 times per set. Do __1-2 sets per session. Do _5_ sessions per week. Anchor Height: Waist  http://tub.exer.us/66   Copyright  VHI. All rights reserved.    EXTENSION: Standing - Resistance Band: Stable (Active)    Stand, right arm at side. Against red  resistance band, draw arm backward, as far as possible, keeping elbow straight. Complete __1-2_ sets of __10-20_ repetitions. Perform __1-2_ sessions per day.  Copyright  VHI. All rights reserved.   Flexibility: Corner Stretch    Standing in corner with hands just above shoulder level and feet ___12_ inches from corner, lean forward until a comfortable stretch is felt across chest. Hold ___30_ seconds. Repeat __3__ times per set. Do ___1_ sets per session. Do ___2_ sessions per day.  http://orth.exer.us/343   Copyright  VHI. All rights reserved.

## 2017-03-18 ENCOUNTER — Ambulatory Visit: Payer: Medicare HMO | Admitting: Physical Therapy

## 2017-03-23 ENCOUNTER — Ambulatory Visit: Payer: Medicare HMO | Admitting: Physical Therapy

## 2017-03-25 ENCOUNTER — Ambulatory Visit: Payer: Medicare HMO | Admitting: Physical Therapy

## 2017-03-30 ENCOUNTER — Ambulatory Visit: Payer: Medicare HMO | Admitting: Physical Therapy

## 2017-03-31 ENCOUNTER — Other Ambulatory Visit: Payer: Self-pay

## 2017-03-31 NOTE — Patient Outreach (Signed)
[facility]) Care Management  [place]  03/31/2017   [name] [birth year] [id number]  Subjective: Telephone call to patient for monthly call.  Patient feeling some frustration with her husband and his dementia.  She reports that he is seeing a psychiatrist and has been told that patient does not have dementia or alzheimer's.  She states that she will be following up with the neurologist.  Encouraged patient to take time to care for herself.  She verbalized understanding.  She reports that her sugars are good.  Discussed with patient continuing her current regiment to control her sugars.  She verbalized understanding.    Objective:   Encounter Medications:  Outpatient Encounter Prescriptions as of 03/31/2017  Medication Sig Note  . bumetanide (BUMEX) 0.5 MG tablet Take 0.5 mg by mouth 2 (two) times daily.     . Cetirizine HCl (ZYRTEC PO) Take 1 tablet by mouth daily as needed. For allergies 12/03/2015: Rarely takes  . cholecalciferol (VITAMIN D) 1000 units tablet Take 1,000 Units by mouth daily.   . diclofenac (VOLTAREN) 75 MG EC tablet Take 75 mg by mouth 2 (two) times daily.   Marland Kitchen diltiazem (CARDIZEM CD) 120 MG 24 hr capsule Take 120 mg by mouth daily.     . enalapril (VASOTEC) 20 MG tablet Take 20 mg by mouth daily.  10/31/2013: Received from: External Pharmacy  . fluticasone (FLONASE) 50 MCG/ACT nasal spray Place 1 spray into both nostrils daily as needed for allergies or rhinitis. Reported on 03/01/7352   . folic acid (FOLVITE) 1 MG tablet Take 2 mg by mouth daily. Reported on 03/17/2016   . gabapentin (NEURONTIN) 300 MG capsule Take 300 mg by mouth at bedtime.   Marland Kitchen ketorolac (ACULAR) 0.5 % ophthalmic solution 1 drop. Reported on 03/17/2016 06/27/2014: Received from: External Pharmacy  . lovastatin (MEVACOR) 40 MG tablet Take 40 mg by mouth at bedtime.  06/27/2014: Received from: External Pharmacy  . Magnesium 300 MG CAPS Take 300 mg by mouth every other day.   .  metFORMIN (GLUCOPHAGE) 500 MG tablet Take 500-1,000 mg by mouth 2 (two) times daily with a meal. Take 1 tablet in the morning Take 2 tablets with dinner 09/21/2016: Patient taking 1 mid morning and two at night.   . methotrexate (RHEUMATREX) 2.5 MG tablet Take 12.5 mg by mouth once a week. Caution:Chemotherapy. Protect from light. 12/03/2015: sunday  . Multiple Vitamin (MULTIVITAMIN) capsule Take 1 capsule by mouth daily.     Marland Kitchen NASAL SALINE NA Place into the nose as needed.   Marland Kitchen ofloxacin (OCUFLOX) 0.3 % ophthalmic solution Reported on 03/17/2016 06/27/2014: Received from: External Pharmacy  . ranitidine (ZANTAC) 300 MG tablet Take 300 mg by mouth 2 (two) times daily.  10/31/2013: Received from: External Pharmacy   No facility-administered encounter medications on file as of 03/31/2017.     Functional Status:  In your present state of health, do you have any difficulty performing the following activities: 09/21/2016 09/03/2016  Hearing? N N  Vision? N N  Difficulty concentrating or making decisions? N N  Walking or climbing stairs? N N  Dressing or bathing? N N  Doing errands, shopping? N N  Preparing Food and eating ? N N  Using the Toilet? N N  In the past six months, have you accidently leaked urine? N N  Do you have problems with loss of bowel control? N N  Managing your Medications? N N  Managing your Finances? N N  Housekeeping  or managing your Housekeeping? N N  Some recent data might be hidden    Fall/Depression Screening: Fall Risk  03/31/2017 03/08/2017 02/08/2017  Falls in the past year? Yes Yes Yes  Number falls in past yr: - - -  Injury with Fall? - - -  Follow up - - -   PHQ 2/9 Scores 03/31/2017 03/08/2017 02/08/2017 01/11/2017 12/08/2016 10/19/2016 09/21/2016  PHQ - 2 Score 0 0 0 0 0 0 0    Assessment: Patient continues to benefit from health coach outreach for disease management and support.    Plan:  [name] CM Care Plan Problem One     Most Recent Value  Care Plan Problem  One  Knowledge Deficit Diabetes  Role Documenting the Problem One  Health Coach  Care Plan for Problem One  Active  THN Long Term Goal (31-90 days)  Patient will maintain A1c less than 6.0 within 90 days.  THN Long Term Goal Start Date  03/31/17 [goal continued]  Interventions for Problem One Long Term Goal  RN Health Coach reiterated with patient importance of maintaining A1c and affects of diabetes     RN Health Coach will contact patient in the month of June and patient agrees to next outreach.  Anne Baseman, RN, MSN [place] [phone]

## 2017-04-01 ENCOUNTER — Ambulatory Visit: Payer: Medicare HMO | Admitting: Physical Therapy

## 2017-04-08 DIAGNOSIS — Z79899 Other long term (current) drug therapy: Secondary | ICD-10-CM | POA: Diagnosis not present

## 2017-04-08 DIAGNOSIS — M1990 Unspecified osteoarthritis, unspecified site: Secondary | ICD-10-CM | POA: Diagnosis not present

## 2017-04-08 DIAGNOSIS — M7551 Bursitis of right shoulder: Secondary | ICD-10-CM | POA: Diagnosis not present

## 2017-04-08 DIAGNOSIS — M0609 Rheumatoid arthritis without rheumatoid factor, multiple sites: Secondary | ICD-10-CM | POA: Diagnosis not present

## 2017-04-15 DIAGNOSIS — M7062 Trochanteric bursitis, left hip: Secondary | ICD-10-CM | POA: Diagnosis not present

## 2017-04-15 DIAGNOSIS — M7551 Bursitis of right shoulder: Secondary | ICD-10-CM | POA: Diagnosis not present

## 2017-04-15 DIAGNOSIS — M0609 Rheumatoid arthritis without rheumatoid factor, multiple sites: Secondary | ICD-10-CM | POA: Diagnosis not present

## 2017-04-15 DIAGNOSIS — Z79899 Other long term (current) drug therapy: Secondary | ICD-10-CM | POA: Diagnosis not present

## 2017-04-15 DIAGNOSIS — M1990 Unspecified osteoarthritis, unspecified site: Secondary | ICD-10-CM | POA: Diagnosis not present

## 2017-04-16 DIAGNOSIS — L0292 Furuncle, unspecified: Secondary | ICD-10-CM | POA: Diagnosis not present

## 2017-05-04 ENCOUNTER — Other Ambulatory Visit: Payer: Self-pay

## 2017-05-04 NOTE — Patient Outreach (Signed)
[facility]) Care Management  [place]  05/04/2017   GREENLEIGH KAUTH May 11, 1946 [id number]  Subjective: Telephone call to patient for monthly call.  Patient reports she is doing ok.  She still deals with arthritis pain for which she manages with pain medications.  Patient reports last blood sugar was 108. Patient continues to try to keep her sugars under control through diet.  Reviewed with patient diabetic diet and continuing her regimen.  She verbalized understanding.    Objective:   Encounter Medications:  Outpatient Encounter Prescriptions as of 05/04/2017  Medication Sig Note  . bumetanide (BUMEX) 0.5 MG tablet Take 0.5 mg by mouth 2 (two) times daily.     . Cetirizine HCl (ZYRTEC PO) Take 1 tablet by mouth daily as needed. For allergies 12/03/2015: Rarely takes  . cholecalciferol (VITAMIN D) 1000 units tablet Take 1,000 Units by mouth daily.   Marland Kitchen diltiazem (CARDIZEM CD) 120 MG 24 hr capsule Take 120 mg by mouth daily.     . enalapril (VASOTEC) 20 MG tablet Take 20 mg by mouth daily.  10/31/2013: Received from: External Pharmacy  . fluticasone (FLONASE) 50 MCG/ACT nasal spray Place 1 spray into both nostrils daily as needed for allergies or rhinitis. Reported on 9/51/8841   . folic acid (FOLVITE) 1 MG tablet Take 2 mg by mouth daily. Reported on 03/17/2016   . gabapentin (NEURONTIN) 300 MG capsule Take 300 mg by mouth at bedtime.   Marland Kitchen ketorolac (ACULAR) 0.5 % ophthalmic solution 1 drop. Reported on 03/17/2016 06/27/2014: Received from: External Pharmacy  . lovastatin (MEVACOR) 40 MG tablet Take 40 mg by mouth at bedtime.  06/27/2014: Received from: External Pharmacy  . Magnesium 300 MG CAPS Take 300 mg by mouth every other day.   . metFORMIN (GLUCOPHAGE) 500 MG tablet Take 500-1,000 mg by mouth 2 (two) times daily with a meal. Take 1 tablet in the morning Take 2 tablets with dinner 09/21/2016: Patient taking 1 mid morning and two at night.   . methotrexate (RHEUMATREX)  2.5 MG tablet Take 12.5 mg by mouth once a week. Caution:Chemotherapy. Protect from light. 12/03/2015: sunday  . Multiple Vitamin (MULTIVITAMIN) capsule Take 1 capsule by mouth daily.     Marland Kitchen ofloxacin (OCUFLOX) 0.3 % ophthalmic solution Reported on 03/17/2016 06/27/2014: Received from: External Pharmacy  . ranitidine (ZANTAC) 300 MG tablet Take 300 mg by mouth 2 (two) times daily.  10/31/2013: Received from: External Pharmacy  . diclofenac (VOLTAREN) 75 MG EC tablet Take 75 mg by mouth 2 (two) times daily.   Marland Kitchen NASAL SALINE NA Place into the nose as needed.    No facility-administered encounter medications on file as of 05/04/2017.     Functional Status:  In your present state of health, do you have any difficulty performing the following activities: 09/21/2016 09/03/2016  Hearing? N N  Vision? N N  Difficulty concentrating or making decisions? N N  Walking or climbing stairs? N N  Dressing or bathing? N N  Doing errands, shopping? N N  Preparing Food and eating ? N N  Using the Toilet? N N  In the past six months, have you accidently leaked urine? N N  Do you have problems with loss of bowel control? N N  Managing your Medications? N N  Managing your Finances? N N  Housekeeping or managing your Housekeeping? N N  Some recent data might be hidden    Fall/Depression Screening: Fall Risk  05/04/2017 03/31/2017 03/08/2017  Falls in  the past year? Yes Yes Yes  Number falls in past yr: - - -  Injury with Fall? - - -  Follow up - - -   PHQ 2/9 Scores 05/04/2017 03/31/2017 03/08/2017 02/08/2017 01/11/2017 12/08/2016 10/19/2016  PHQ - 2 Score 0 0 0 0 0 0 0    Assessment: Patient continues to benefit from health coach outreach for disease management and support.    Plan:  [facility] CM Care Plan Problem One     Most Recent Value  Care Plan Problem One  Knowledge Deficit Diabetes  Role Documenting the Problem One  Health Coach  Care Plan for Problem One  Active  THN Long Term Goal   Patient will keep A1c  6.0 or less within 90 days.  THN Long Term Goal Start Date  05/04/17 [goal continued]  Interventions for Problem One Long Term Goal  RN Health Coach reiterated with patient importance of maintaining A1c and affects of diabetes     RN Health Coach will contact patient in the month of July and patient agrees to next outreach.  Jone Baseman, RN, MSN [place] [phone]

## 2017-05-06 DIAGNOSIS — Z5181 Encounter for therapeutic drug level monitoring: Secondary | ICD-10-CM | POA: Diagnosis not present

## 2017-05-24 DIAGNOSIS — E119 Type 2 diabetes mellitus without complications: Secondary | ICD-10-CM | POA: Diagnosis not present

## 2017-05-24 DIAGNOSIS — E7800 Pure hypercholesterolemia, unspecified: Secondary | ICD-10-CM | POA: Diagnosis not present

## 2017-05-24 DIAGNOSIS — D473 Essential (hemorrhagic) thrombocythemia: Secondary | ICD-10-CM | POA: Diagnosis not present

## 2017-05-27 ENCOUNTER — Other Ambulatory Visit: Payer: Self-pay

## 2017-05-27 NOTE — Patient Outreach (Signed)
[facility]) Care Management  [place]  05/27/2017   [name] Dec 05, 1945 [id number]  Subjective: Telephone call to patient for monthly call.  Patient reports she is doing good.  Patient asked about EMMI information received last month.  Discussed with patient EMMI information sent about counting carbohydrates and the importance of counting her carbohydrates to keep sugars down.  She verbalized understanding.  Patient most recent sugar was 110.  She reports eating some watermelon on yesterday explaining increased sugar outside of her normal.  Encouraged patient to continue her regimen. She verbalized understanding.  Objective:   Encounter Medications:  Outpatient Encounter Prescriptions as of 05/27/2017  Medication Sig Note  . bumetanide (BUMEX) 0.5 MG tablet Take 0.5 mg by mouth 2 (two) times daily.     . Cetirizine HCl (ZYRTEC PO) Take 1 tablet by mouth daily as needed. For allergies 12/03/2015: Rarely takes  . cholecalciferol (VITAMIN D) 1000 units tablet Take 1,000 Units by mouth daily.   Marland Kitchen diltiazem (CARDIZEM CD) 120 MG 24 hr capsule Take 120 mg by mouth daily.     . enalapril (VASOTEC) 20 MG tablet Take 20 mg by mouth daily.  10/31/2013: Received from: External Pharmacy  . fluticasone (FLONASE) 50 MCG/ACT nasal spray Place 1 spray into both nostrils daily as needed for allergies or rhinitis. Reported on 9/48/0165   . folic acid (FOLVITE) 1 MG tablet Take 2 mg by mouth daily. Reported on 03/17/2016   . gabapentin (NEURONTIN) 300 MG capsule Take 300 mg by mouth at bedtime.   . lovastatin (MEVACOR) 40 MG tablet Take 40 mg by mouth at bedtime.  06/27/2014: Received from: External Pharmacy  . Magnesium 300 MG CAPS Take 300 mg by mouth every other day.   . metFORMIN (GLUCOPHAGE) 500 MG tablet Take 500-1,000 mg by mouth 2 (two) times daily with a meal. Take 1 tablet in the morning Take 2 tablets with dinner 09/21/2016: Patient taking 1 mid morning and two at night.   .  methotrexate (RHEUMATREX) 2.5 MG tablet Take 12.5 mg by mouth once a week. Caution:Chemotherapy. Protect from light. 12/03/2015: sunday  . Multiple Vitamin (MULTIVITAMIN) capsule Take 1 capsule by mouth daily.     Marland Kitchen NASAL SALINE NA Place into the nose as needed.   . ranitidine (ZANTAC) 300 MG tablet Take 300 mg by mouth 2 (two) times daily.  10/31/2013: Received from: External Pharmacy  . diclofenac (VOLTAREN) 75 MG EC tablet Take 75 mg by mouth 2 (two) times daily.   Marland Kitchen ketorolac (ACULAR) 0.5 % ophthalmic solution 1 drop. Reported on 03/17/2016 06/27/2014: Received from: External Pharmacy  . ofloxacin (OCUFLOX) 0.3 % ophthalmic solution Reported on 03/17/2016 06/27/2014: Received from: External Pharmacy   No facility-administered encounter medications on file as of 05/27/2017.     Functional Status:  In your present state of health, do you have any difficulty performing the following activities: 09/21/2016 09/03/2016  Hearing? N N  Vision? N N  Difficulty concentrating or making decisions? N N  Walking or climbing stairs? N N  Dressing or bathing? N N  Doing errands, shopping? N N  Preparing Food and eating ? N N  Using the Toilet? N N  In the past six months, have you accidently leaked urine? N N  Do you have problems with loss of bowel control? N N  Managing your Medications? N N  Managing your Finances? N N  Housekeeping or managing your Housekeeping? N N  Some recent data might  be hidden    Fall/Depression Screening: Fall Risk  05/27/2017 05/04/2017 03/31/2017  Falls in the past year? Yes Yes Yes  Number falls in past yr: - - -  Injury with Fall? - - -  Follow up - - -   PHQ 2/9 Scores 05/27/2017 05/04/2017 03/31/2017 03/08/2017 02/08/2017 01/11/2017 12/08/2016  PHQ - 2 Score 0 0 0 0 0 0 0    Assessment: Patient continues to benefit from health coach outreach for disease management and support.    Plan:  [facility] CM Care Plan Problem One     Most Recent Value  Care Plan Problem One  Knowledge  Deficit Diabetes  Role Documenting the Problem One  Health Coach  Care Plan for Problem One  Active  THN Long Term Goal   Patient will keep A1c 6.0 or less within 90 days.  THN Long Term Goal Start Date  05/27/17 [goal continued]  Interventions for Problem One Long Term Goal  RN Health Coach reviwed with patient importance of maintaining A1c and affects of diabetes     RN Health Coach will contact patient in the month of August and patient agrees to next outreach.  Jone Baseman, RN, MSN [place] [phone]

## 2017-06-01 DIAGNOSIS — E7800 Pure hypercholesterolemia, unspecified: Secondary | ICD-10-CM | POA: Diagnosis not present

## 2017-06-01 DIAGNOSIS — E119 Type 2 diabetes mellitus without complications: Secondary | ICD-10-CM | POA: Diagnosis not present

## 2017-06-01 DIAGNOSIS — K219 Gastro-esophageal reflux disease without esophagitis: Secondary | ICD-10-CM | POA: Diagnosis not present

## 2017-06-01 DIAGNOSIS — I10 Essential (primary) hypertension: Secondary | ICD-10-CM | POA: Diagnosis not present

## 2017-06-16 DIAGNOSIS — H26492 Other secondary cataract, left eye: Secondary | ICD-10-CM | POA: Diagnosis not present

## 2017-06-16 DIAGNOSIS — H26491 Other secondary cataract, right eye: Secondary | ICD-10-CM | POA: Diagnosis not present

## 2017-06-16 DIAGNOSIS — E119 Type 2 diabetes mellitus without complications: Secondary | ICD-10-CM | POA: Diagnosis not present

## 2017-06-16 DIAGNOSIS — H40013 Open angle with borderline findings, low risk, bilateral: Secondary | ICD-10-CM | POA: Diagnosis not present

## 2017-06-18 ENCOUNTER — Ambulatory Visit (INDEPENDENT_AMBULATORY_CARE_PROVIDER_SITE_OTHER): Payer: Medicare HMO | Admitting: Podiatry

## 2017-06-18 ENCOUNTER — Encounter: Payer: Self-pay | Admitting: Podiatry

## 2017-06-18 DIAGNOSIS — M2042 Other hammer toe(s) (acquired), left foot: Secondary | ICD-10-CM | POA: Diagnosis not present

## 2017-06-18 NOTE — Patient Instructions (Signed)

## 2017-06-20 NOTE — Progress Notes (Signed)
Subjective:    Patient ID: [name], female   DOB: 71 y.o.   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 Due to failure to respond to previous treatments I do think arthroplasty is indicated and she wants this done and I allowed her to read consent form concerning alternative treatments complications. She understands is no guarantee as to follow long-term results and the told recovery. Can take approximate 6 months and she signs consent form at this time. Patient scheduled for outpatient surgery

## 2017-06-23 DIAGNOSIS — M5416 Radiculopathy, lumbar region: Secondary | ICD-10-CM | POA: Diagnosis not present

## 2017-06-29 ENCOUNTER — Other Ambulatory Visit: Payer: Self-pay

## 2017-06-29 NOTE — Patient Outreach (Signed)
[place] [facility]) Care Management  [place]  06/29/2017   [name] February 25, 1946 [id number]  Subjective: Telephone call to patient she reports she is doing ok.  She reports that she is cleaning her house to get rid of some things. Patient reports recently seeing primary care physician and that her A1c was 6.1.  Patient states it was a little higher than the last time but she will be working to start walking to help her sugars.  Advised patient that her A1c was still not too bad but to continue watching her diet and incorporate walking. She verbalized understanding.   Objective:   Encounter Medications:  Outpatient Encounter Prescriptions as of 06/29/2017  Medication Sig Note  . bumetanide (BUMEX) 0.5 MG tablet Take 0.5 mg by mouth 2 (two) times daily.     . Cetirizine HCl (ZYRTEC PO) Take 1 tablet by mouth daily as needed. For allergies 12/03/2015: Rarely takes  . cholecalciferol (VITAMIN D) 1000 units tablet Take 1,000 Units by mouth daily.   . diclofenac (VOLTAREN) 75 MG EC tablet Take 75 mg by mouth 2 (two) times daily.   Marland Kitchen diltiazem (CARDIZEM CD) 120 MG 24 hr capsule Take 120 mg by mouth daily.     . enalapril (VASOTEC) 20 MG tablet Take 20 mg by mouth daily.  10/31/2013: Received from: External Pharmacy  . fluticasone (FLONASE) 50 MCG/ACT nasal spray Place 1 spray into both nostrils daily as needed for allergies or rhinitis. Reported on 2/95/1884   . folic acid (FOLVITE) 1 MG tablet Take 2 mg by mouth daily. Reported on 03/17/2016   . gabapentin (NEURONTIN) 300 MG capsule Take 300 mg by mouth at bedtime.   Marland Kitchen ketorolac (ACULAR) 0.5 % ophthalmic solution 1 drop. Reported on 03/17/2016 06/27/2014: Received from: External Pharmacy  . lovastatin (MEVACOR) 40 MG tablet Take 40 mg by mouth at bedtime.  06/27/2014: Received from: External Pharmacy  . Magnesium 300 MG CAPS Take 300 mg by mouth every other day.   . metFORMIN (GLUCOPHAGE) 500 MG tablet Take 500-1,000 mg by mouth 2  (two) times daily with a meal. Take 1 tablet in the morning Take 2 tablets with dinner 09/21/2016: Patient taking 1 mid morning and two at night.   . methotrexate (RHEUMATREX) 2.5 MG tablet Take 12.5 mg by mouth once a week. Caution:Chemotherapy. Protect from light. 12/03/2015: sunday  . Multiple Vitamin (MULTIVITAMIN) capsule Take 1 capsule by mouth daily.     Marland Kitchen NASAL SALINE NA Place into the nose as needed.   Marland Kitchen ofloxacin (OCUFLOX) 0.3 % ophthalmic solution Reported on 03/17/2016 06/27/2014: Received from: External Pharmacy  . ranitidine (ZANTAC) 300 MG tablet Take 300 mg by mouth 2 (two) times daily.  10/31/2013: Received from: External Pharmacy   No facility-administered encounter medications on file as of 06/29/2017.     Functional Status:  In your present state of health, do you have any difficulty performing the following activities: 09/21/2016 09/03/2016  Hearing? N N  Vision? N N  Difficulty concentrating or making decisions? N N  Walking or climbing stairs? N N  Dressing or bathing? N N  Doing errands, shopping? N N  Preparing Food and eating ? N N  Using the Toilet? N N  In the past six months, have you accidently leaked urine? N N  Do you have problems with loss of bowel control? N N  Managing your Medications? N N  Managing your Finances? N N  Housekeeping or managing your Housekeeping? N  N  Some recent data might be hidden    Fall/Depression Screening: Fall Risk  06/29/2017 05/27/2017 05/04/2017  Falls in the past year? Yes Yes Yes  Number falls in past yr: - - -  Injury with Fall? - - -  Follow up - - -   PHQ 2/9 Scores 06/29/2017 05/27/2017 05/04/2017 03/31/2017 03/08/2017 02/08/2017 01/11/2017  PHQ - 2 Score 0 0 0 0 0 0 0    Assessment: Patient continues to benefit from health coach outreach for disease management and support.    Plan:  [facility] CM Care Plan Problem One     Most Recent Value  Care Plan Problem One  Knowledge Deficit Diabetes  Role Documenting the Problem One   Health Coach  Care Plan for Problem One  Active  THN Long Term Goal   Patient will keep A1c 6.0 or less within 90 days.  THN Long Term Goal Start Date  06/29/17 [goal continued]  Interventions for Problem One Long Term Goal  RN Health Coach reiterated with patient importance of maintaining A1c and affects of diabetes     RN Health Coach will contact patient in the month of September and patient agrees to next outreach.  Jone Baseman, RN, MSN [place] [phone]

## 2017-07-06 ENCOUNTER — Telehealth: Payer: Self-pay | Admitting: *Deleted

## 2017-07-06 NOTE — Telephone Encounter (Signed)
"  I'm calling regarding a x-ray report.  I didn't see one in her clinical notes."  He did x-rays on January 18, 2017.  He said the results were in his clinical note for that date.  He stated it showed a rotated fifth digit left with hammertoe deformity and corn callus formation on the head of the proximal phalanx.  "I saw that but I'm used to it saying 3 views show whatever.  Okay, thank you."

## 2017-07-13 ENCOUNTER — Encounter: Payer: Self-pay | Admitting: Podiatry

## 2017-07-13 DIAGNOSIS — E7800 Pure hypercholesterolemia, unspecified: Secondary | ICD-10-CM | POA: Diagnosis not present

## 2017-07-13 DIAGNOSIS — M2042 Other hammer toe(s) (acquired), left foot: Secondary | ICD-10-CM | POA: Diagnosis not present

## 2017-07-21 ENCOUNTER — Encounter: Payer: Self-pay | Admitting: Podiatry

## 2017-07-21 ENCOUNTER — Ambulatory Visit (INDEPENDENT_AMBULATORY_CARE_PROVIDER_SITE_OTHER): Payer: Medicare HMO

## 2017-07-21 ENCOUNTER — Ambulatory Visit (INDEPENDENT_AMBULATORY_CARE_PROVIDER_SITE_OTHER): Payer: Medicare HMO | Admitting: Podiatry

## 2017-07-21 VITALS — BP 102/62 | HR 85 | Temp 97.9°F | Resp 16

## 2017-07-21 DIAGNOSIS — M0609 Rheumatoid arthritis without rheumatoid factor, multiple sites: Secondary | ICD-10-CM | POA: Diagnosis not present

## 2017-07-21 DIAGNOSIS — M1990 Unspecified osteoarthritis, unspecified site: Secondary | ICD-10-CM | POA: Diagnosis not present

## 2017-07-21 DIAGNOSIS — M2042 Other hammer toe(s) (acquired), left foot: Secondary | ICD-10-CM | POA: Diagnosis not present

## 2017-07-21 DIAGNOSIS — Z79899 Other long term (current) drug therapy: Secondary | ICD-10-CM | POA: Diagnosis not present

## 2017-07-21 DIAGNOSIS — M7551 Bursitis of right shoulder: Secondary | ICD-10-CM | POA: Diagnosis not present

## 2017-07-21 NOTE — Progress Notes (Signed)
Subjective:    Patient ID: [name], female   DOB: 71 y.o.   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

## 2017-07-27 NOTE — Progress Notes (Signed)
DOS 07/13/2017 Hammertoe Repair 5th LT

## 2017-07-28 ENCOUNTER — Encounter: Payer: Medicare HMO | Admitting: Podiatry

## 2017-08-02 ENCOUNTER — Ambulatory Visit: Payer: Self-pay

## 2017-08-04 DIAGNOSIS — M5416 Radiculopathy, lumbar region: Secondary | ICD-10-CM | POA: Diagnosis not present

## 2017-08-06 ENCOUNTER — Other Ambulatory Visit: Payer: Self-pay

## 2017-08-06 NOTE — Patient Outreach (Signed)
[place] [facility]) Care Management  08/06/2017  ELSE HABERMANN 12-Nov-1946 [id number]   Telephone call to patient for monthly call.  No answer.  HIPAA compliant voice message left.  Plan: RN Health Coach will attempt patient again in the month of September.    Jone Baseman, RN, MSN [place] [phone]

## 2017-08-10 ENCOUNTER — Ambulatory Visit: Payer: Medicare HMO

## 2017-08-12 ENCOUNTER — Ambulatory Visit (INDEPENDENT_AMBULATORY_CARE_PROVIDER_SITE_OTHER): Payer: Medicare HMO | Admitting: Podiatry

## 2017-08-12 ENCOUNTER — Ambulatory Visit (INDEPENDENT_AMBULATORY_CARE_PROVIDER_SITE_OTHER): Payer: Medicare HMO

## 2017-08-12 ENCOUNTER — Encounter: Payer: Self-pay | Admitting: Podiatry

## 2017-08-12 DIAGNOSIS — M2042 Other hammer toe(s) (acquired), left foot: Secondary | ICD-10-CM

## 2017-08-12 NOTE — Progress Notes (Signed)
Subjective:    Patient ID: [name], female   DOB: 71 y.o.   MRN: [id number]   HPI patient presents for suture removal left states the toe is still mildly swollen but doing better    ROS      Objective:  Physical Exam neurovascular status intact with mild edema in the left fifth digit with wound edges well coapted and toe in good alignment     Assessment:   Doing well post digital deformity fifth left      Plan:   X-ray reviewed stitches removed wound edges well coapted and allow gradual return to soft shoes  X-rays indicate satisfactory section had a proximal phalanx fifth digit left

## 2017-08-13 ENCOUNTER — Other Ambulatory Visit: Payer: Self-pay

## 2017-08-13 NOTE — Patient Outreach (Signed)
[place] [facility]) Care Management  [place]  08/13/2017   LEASIA SWANN 1946-06-08 [id number]  Subjective: Telephone call from patient for monthly call. Patient reports she is having some issues with holding her bowels at times but will be discussing with her physician on next visit.  Discussed with patient how sphincter muscle can relax and maybe some tightening exercises may help.  She verbalized understanding and may try those.  Patient reports blood sugar check this morning was 101.  Encouraged patient to keep up great work and case closure on next call. She verbalized understanding.   Objective:   Encounter Medications:  Outpatient Encounter Prescriptions as of 08/13/2017  Medication Sig Note  . bumetanide (BUMEX) 0.5 MG tablet Take 0.5 mg by mouth 2 (two) times daily.     . Cetirizine HCl (ZYRTEC PO) Take 1 tablet by mouth daily as needed. For allergies 12/03/2015: Rarely takes  . cholecalciferol (VITAMIN D) 1000 units tablet Take 1,000 Units by mouth daily.   . diclofenac (VOLTAREN) 75 MG EC tablet Take 75 mg by mouth 2 (two) times daily.   Marland Kitchen diltiazem (CARDIZEM CD) 120 MG 24 hr capsule Take 120 mg by mouth daily.     . enalapril (VASOTEC) 20 MG tablet Take 20 mg by mouth daily.  10/31/2013: Received from: External Pharmacy  . fluticasone (FLONASE) 50 MCG/ACT nasal spray Place 1 spray into both nostrils daily as needed for allergies or rhinitis. Reported on 03/01/8118   . folic acid (FOLVITE) 1 MG tablet Take 2 mg by mouth daily. Reported on 03/17/2016   . gabapentin (NEURONTIN) 300 MG capsule Take 300 mg by mouth at bedtime.   Marland Kitchen HYDROcodone-acetaminophen (NORCO/VICODIN) 5-325 MG tablet Take 1 tablet by mouth every 6 (six) hours as needed for moderate pain.   Marland Kitchen ketorolac (ACULAR) 0.5 % ophthalmic solution 1 drop. Reported on 03/17/2016 06/27/2014: Received from: External Pharmacy  . lovastatin (MEVACOR) 40 MG tablet Take 40 mg by mouth at bedtime.  06/27/2014: Received  from: External Pharmacy  . Magnesium 300 MG CAPS Take 300 mg by mouth every other day.   . metFORMIN (GLUCOPHAGE) 500 MG tablet Take 500-1,000 mg by mouth 2 (two) times daily with a meal. Take 1 tablet in the morning Take 2 tablets with dinner 09/21/2016: Patient taking 1 mid morning and two at night.   . methotrexate (RHEUMATREX) 2.5 MG tablet Take 12.5 mg by mouth once a week. Caution:Chemotherapy. Protect from light. 12/03/2015: sunday  . Multiple Vitamin (MULTIVITAMIN) capsule Take 1 capsule by mouth daily.     Marland Kitchen NASAL SALINE NA Place into the nose as needed.   Marland Kitchen ofloxacin (OCUFLOX) 0.3 % ophthalmic solution Reported on 03/17/2016 06/27/2014: Received from: External Pharmacy  . ranitidine (ZANTAC) 300 MG tablet Take 300 mg by mouth 2 (two) times daily.  10/31/2013: Received from: External Pharmacy   No facility-administered encounter medications on file as of 08/13/2017.     Functional Status:  In your present state of health, do you have any difficulty performing the following activities: 09/21/2016 09/03/2016  Hearing? N N  Vision? N N  Difficulty concentrating or making decisions? N N  Walking or climbing stairs? N N  Dressing or bathing? N N  Doing errands, shopping? N N  Preparing Food and eating ? N N  Using the Toilet? N N  In the past six months, have you accidently leaked urine? N N  Do you have problems with loss of bowel control? N N  Managing your Medications? N N  Managing your Finances? N N  Housekeeping or managing your Housekeeping? N N  Some recent data might be hidden    Fall/Depression Screening: Fall Risk  08/13/2017 06/29/2017 05/27/2017  Falls in the past year? Yes Yes Yes  Number falls in past yr: - - -  Injury with Fall? - - -  Follow up - - -   PHQ 2/9 Scores 08/13/2017 06/29/2017 05/27/2017 05/04/2017 03/31/2017 03/08/2017 02/08/2017  PHQ - 2 Score 0 0 0 0 0 0 0    Assessment: Patient continues to benefit from health coach outreach for disease management and  support.    Plan:  [facility] CM Care Plan Problem One     Most Recent Value  Care Plan Problem One  Knowledge Deficit Diabetes  Role Documenting the Problem One  Health Coach  Care Plan for Problem One  Active  THN Long Term Goal   Patient will keep A1c 6.0 or less within 90 days.  THN Long Term Goal Start Date  08/13/17 [goal continued]  Interventions for Problem One Long Term Goal  RN Health Coach reviewed with patient importance of maintaining A1c and affects of diabetes     RN Health Coach will contact patient in the month of October and patient agrees to next outreach.  Jone Baseman, RN, MSN [place] [phone]

## 2017-08-13 NOTE — Patient Outreach (Signed)
[place] [facility]) Care Management  08/13/2017  MATRICIA BEGNAUD 26-Jan-1946 [id number]   Telephone call to patient for monthly call.  No answer.  HIPAA compliant voice message left.  Plan: RN Health Coach will attempt patient again in the month of September.   Jone Baseman, RN, MSN [place] [phone]

## 2017-08-16 ENCOUNTER — Ambulatory Visit: Payer: Self-pay

## 2017-08-19 DIAGNOSIS — M5416 Radiculopathy, lumbar region: Secondary | ICD-10-CM | POA: Diagnosis not present

## 2017-08-24 DIAGNOSIS — E7800 Pure hypercholesterolemia, unspecified: Secondary | ICD-10-CM | POA: Diagnosis not present

## 2017-08-24 DIAGNOSIS — E119 Type 2 diabetes mellitus without complications: Secondary | ICD-10-CM | POA: Diagnosis not present

## 2017-08-31 DIAGNOSIS — E7800 Pure hypercholesterolemia, unspecified: Secondary | ICD-10-CM | POA: Diagnosis not present

## 2017-08-31 DIAGNOSIS — K219 Gastro-esophageal reflux disease without esophagitis: Secondary | ICD-10-CM | POA: Diagnosis not present

## 2017-08-31 DIAGNOSIS — Z23 Encounter for immunization: Secondary | ICD-10-CM | POA: Diagnosis not present

## 2017-08-31 DIAGNOSIS — E119 Type 2 diabetes mellitus without complications: Secondary | ICD-10-CM | POA: Diagnosis not present

## 2017-08-31 DIAGNOSIS — M5416 Radiculopathy, lumbar region: Secondary | ICD-10-CM | POA: Diagnosis not present

## 2017-08-31 DIAGNOSIS — I10 Essential (primary) hypertension: Secondary | ICD-10-CM | POA: Diagnosis not present

## 2017-09-06 ENCOUNTER — Other Ambulatory Visit: Payer: Medicare HMO

## 2017-09-06 ENCOUNTER — Other Ambulatory Visit: Payer: Self-pay

## 2017-09-06 NOTE — Patient Outreach (Signed)
[place] [facility]) Care Management  [place]  09/06/2017   [name] August 10, 1946 [id number]  Subjective: Telephone call to patient for monthly call. Patient reports she is having some problems with her back and also some family stress.  She reports that her daughter and grandchildren are back in the home adding some stress along with her spouse.  Patient has been started on Prozac but she does not like the way it makes her feels. Advised patient to notify physician about the medication and how she feels.  She verbalized understanding.  Patient to have MRI on her back today and really interested to know what is going on as she has been dragging her left foot and having some pain. She reports that her last shot to her back area was no good and she has some tramadol for pain which she is alternating with tylenol for pain relief.  Patient states she will be in contact with physician after her MRI as she has a big weekend ahead and wants some advice on how to proceed after her MRI.  Patient reports that her sugars are good and last check was 109.  Her A1c was 6.1.  Encouraged patient to continue to care for herself to relieve her stress.  She verbalized understanding. Advised that I would not be closing her case this month as she needs the support at this time.  Reminded patient to call if she needs to if she has questions before my next call.  She verbalized understanding.   Objective:   Encounter Medications:  Outpatient Encounter Prescriptions as of 09/06/2017  Medication Sig Note  . bumetanide (BUMEX) 0.5 MG tablet Take 0.5 mg by mouth 2 (two) times daily.     . Cetirizine HCl (ZYRTEC PO) Take 1 tablet by mouth daily as needed. For allergies 12/03/2015: Rarely takes  . cholecalciferol (VITAMIN D) 1000 units tablet Take 1,000 Units by mouth daily.   Marland Kitchen diltiazem (CARDIZEM CD) 120 MG 24 hr capsule Take 120 mg by mouth daily.     . enalapril (VASOTEC) 20 MG tablet Take 20 mg by  mouth daily.  10/31/2013: Received from: External Pharmacy  . FLUoxetine (PROZAC) 20 MG tablet Take 20 mg by mouth daily. Take 1/2 tablet daily then 1 tablet daily.   . fluticasone (FLONASE) 50 MCG/ACT nasal spray Place 1 spray into both nostrils daily as needed for allergies or rhinitis. Reported on 3/38/2505   . folic acid (FOLVITE) 1 MG tablet Take 2 mg by mouth daily. Reported on 03/17/2016   . gabapentin (NEURONTIN) 300 MG capsule Take 300 mg by mouth at bedtime.   Marland Kitchen HYDROcodone-acetaminophen (NORCO/VICODIN) 5-325 MG tablet Take 1 tablet by mouth every 6 (six) hours as needed for moderate pain.   Marland Kitchen ketorolac (ACULAR) 0.5 % ophthalmic solution 1 drop. Reported on 03/17/2016 06/27/2014: Received from: External Pharmacy  . lovastatin (MEVACOR) 40 MG tablet Take 40 mg by mouth at bedtime.  06/27/2014: Received from: External Pharmacy  . Magnesium 300 MG CAPS Take 300 mg by mouth every other day.   . metFORMIN (GLUCOPHAGE) 500 MG tablet Take 500-1,000 mg by mouth 2 (two) times daily with a meal. Take 1 tablet in the morning Take 2 tablets with dinner 09/21/2016: Patient taking 1 mid morning and two at night.   . methotrexate (RHEUMATREX) 2.5 MG tablet Take 12.5 mg by mouth once a week. Caution:Chemotherapy. Protect from light. 12/03/2015: sunday  . Multiple Vitamin (MULTIVITAMIN) capsule Take 1 capsule by mouth  daily.     Marland Kitchen NASAL SALINE NA Place into the nose as needed.   Marland Kitchen ofloxacin (OCUFLOX) 0.3 % ophthalmic solution Reported on 03/17/2016 06/27/2014: Received from: External Pharmacy  . ranitidine (ZANTAC) 300 MG tablet Take 300 mg by mouth 2 (two) times daily.  10/31/2013: Received from: External Pharmacy  . traMADol (ULTRAM) 50 MG tablet Take 50 mg by mouth 2 (two) times daily as needed.   . diclofenac (VOLTAREN) 75 MG EC tablet Take 75 mg by mouth 2 (two) times daily.    No facility-administered encounter medications on file as of 09/06/2017.     Functional Status:  In your present state of health,  do you have any difficulty performing the following activities: 09/21/2016  Hearing? N  Vision? N  Difficulty concentrating or making decisions? N  Walking or climbing stairs? N  Dressing or bathing? N  Doing errands, shopping? N  Preparing Food and eating ? N  Using the Toilet? N  In the past six months, have you accidently leaked urine? N  Do you have problems with loss of bowel control? N  Managing your Medications? N  Managing your Finances? N  Housekeeping or managing your Housekeeping? N  Some recent data might be hidden    Fall/Depression Screening: Fall Risk  09/06/2017 08/13/2017 06/29/2017  Falls in the past year? Yes Yes Yes  Number falls in past yr: - - -  Injury with Fall? - - -  Follow up - - -   PHQ 2/9 Scores 08/13/2017 06/29/2017 05/27/2017 05/04/2017 03/31/2017 03/08/2017 02/08/2017  PHQ - 2 Score 0 0 0 0 0 0 0    Assessment: Patient continues to benefit from health coach outreach for disease management and support.    Plan:  [facility] CM Care Plan Problem One     Most Recent Value  Care Plan Problem One  Knowledge Deficit Diabetes  Role Documenting the Problem One  Care Management Telephonic Coordinator  Care Plan for Problem One  Active  THN Long Term Goal   Patient will keep A1c 6.0 or less within 90 days.  THN Long Term Goal Start Date  09/06/17 [goal continued]  Interventions for Problem One Long Term Goal  RN CM reinforced with patient importance of maintaining A1c and affects of diabetes     RN CM will contact patient in the month of November and patient agrees to next outreach.  Jone Baseman, RN, MSN [facility] Care Management Care Management Coordinator Direct Line [phone] Toll Free: [phone]  Fax: [phone]

## 2017-09-09 DIAGNOSIS — M545 Low back pain: Secondary | ICD-10-CM | POA: Diagnosis not present

## 2017-09-13 DIAGNOSIS — M5416 Radiculopathy, lumbar region: Secondary | ICD-10-CM | POA: Diagnosis not present

## 2017-09-28 DIAGNOSIS — M5416 Radiculopathy, lumbar region: Secondary | ICD-10-CM | POA: Diagnosis not present

## 2017-10-04 ENCOUNTER — Other Ambulatory Visit: Payer: Self-pay

## 2017-10-04 NOTE — Patient Outreach (Signed)
[place] [facility]) Care Management  [place]  10/04/2017   [name] 06/22/1946 [id number]  Subjective: Telephone call to patient for monthly call. Patient reports she is having hip joint pain and that she needs back surgery but does not want that at this time. Patient states she is to get 2 shots to two different nerves to help with pain on November 21st. Patient also states that she is taking tylenol and tramadol as needed. Patient to go on a cruise this month as well and is looking forward to the relaxation.  She reports that she will be looking into other insurance plans as she will be having some changes with her [employer] which will mean more out of pocket expenses for her and her husband. Discussed with patient things to ask agent about the different plan.  She verbalized understanding and thanked CM for the information. Patient reports that her diabetes is doing good with her highest sugar being 109.  Advised patient to continue her current regimen.  She verbalized understanding.   Objective:   Encounter Medications:  Outpatient Encounter Medications as of 10/04/2017  Medication Sig Note  . bumetanide (BUMEX) 0.5 MG tablet Take 0.5 mg by mouth 2 (two) times daily.     . Cetirizine HCl (ZYRTEC PO) Take 1 tablet by mouth daily as needed. For allergies 12/03/2015: Rarely takes  . cholecalciferol (VITAMIN D) 1000 units tablet Take 1,000 Units by mouth daily.   Marland Kitchen diltiazem (CARDIZEM CD) 120 MG 24 hr capsule Take 120 mg by mouth daily.     . enalapril (VASOTEC) 20 MG tablet Take 20 mg by mouth daily.  10/31/2013: Received from: External Pharmacy  . fluticasone (FLONASE) 50 MCG/ACT nasal spray Place 1 spray into both nostrils daily as needed for allergies or rhinitis. Reported on 01/21/6009   . folic acid (FOLVITE) 1 MG tablet Take 2 mg by mouth daily. Reported on 03/17/2016   . gabapentin (NEURONTIN) 300 MG capsule Take 300 mg by mouth at bedtime. 10/04/2017: Taking 300mg   TID.  Marland Kitchen HYDROcodone-acetaminophen (NORCO/VICODIN) 5-325 MG tablet Take 1 tablet by mouth every 6 (six) hours as needed for moderate pain.   Marland Kitchen ketorolac (ACULAR) 0.5 % ophthalmic solution 1 drop. Reported on 03/17/2016 06/27/2014: Received from: External Pharmacy  . lovastatin (MEVACOR) 40 MG tablet Take 40 mg by mouth at bedtime.  06/27/2014: Received from: External Pharmacy  . Magnesium 300 MG CAPS Take 300 mg by mouth every other day.   . metFORMIN (GLUCOPHAGE) 500 MG tablet Take 500-1,000 mg by mouth 2 (two) times daily with a meal. Take 1 tablet in the morning Take 2 tablets with dinner 09/21/2016: Patient taking 1 mid morning and two at night.   . methotrexate (RHEUMATREX) 2.5 MG tablet Take 12.5 mg by mouth once a week. Caution:Chemotherapy. Protect from light. 12/03/2015: sunday  . Multiple Vitamin (MULTIVITAMIN) capsule Take 1 capsule by mouth daily.     Marland Kitchen NASAL SALINE NA Place into the nose as needed.   Marland Kitchen ofloxacin (OCUFLOX) 0.3 % ophthalmic solution Reported on 03/17/2016 06/27/2014: Received from: External Pharmacy  . ranitidine (ZANTAC) 300 MG tablet Take 300 mg by mouth 2 (two) times daily.  10/31/2013: Received from: External Pharmacy  . traMADol (ULTRAM) 50 MG tablet Take 50 mg by mouth 2 (two) times daily as needed.   . diclofenac (VOLTAREN) 75 MG EC tablet Take 75 mg by mouth 2 (two) times daily.   Marland Kitchen FLUoxetine (PROZAC) 20 MG tablet Take 20 mg  by mouth daily. Take 1/2 tablet daily then 1 tablet daily.    No facility-administered encounter medications on file as of 10/04/2017.     Functional Status:  No flowsheet data found.  Fall/Depression Screening: Fall Risk  09/06/2017 08/13/2017 06/29/2017  Falls in the past year? Yes Yes Yes  Number falls in past yr: - - -  Injury with Fall? - - -  Follow up - - -   PHQ 2/9 Scores 08/13/2017 06/29/2017 05/27/2017 05/04/2017 03/31/2017 03/08/2017 02/08/2017  PHQ - 2 Score 0 0 0 0 0 0 0    Assessment:  Patient continues to benefit from care manager  outreach for disease management and support.   Plan:  [facility] CM Care Plan Problem One     Most Recent Value  Care Plan Problem One  Knowledge Deficit Diabetes  Role Documenting the Problem One  Care Management Telephonic Coordinator  Care Plan for Problem One  Active  THN Long Term Goal   Patient will keep A1c 6.0 or less within 90 days.  THN Long Term Goal Start Date  09/06/17 [goal continued]  Interventions for Problem One Long Term Goal  RN CM advised to continue current regimen.  RN CM discussed with patient importance of maintaining A1c and affects of diabetes     RN CM will contact patient in the month of December and patient agrees to next outreach.  Jone Baseman, RN, MSN [facility] Care Management Care Management Coordinator Direct Line [phone] Toll Free: [phone]  Fax: [phone]

## 2017-10-12 DIAGNOSIS — M5416 Radiculopathy, lumbar region: Secondary | ICD-10-CM | POA: Diagnosis not present

## 2017-10-25 ENCOUNTER — Ambulatory Visit: Payer: Self-pay

## 2017-10-27 DIAGNOSIS — Z1231 Encounter for screening mammogram for malignant neoplasm of breast: Secondary | ICD-10-CM | POA: Diagnosis not present

## 2017-10-28 DIAGNOSIS — E559 Vitamin D deficiency, unspecified: Secondary | ICD-10-CM | POA: Diagnosis not present

## 2017-10-28 DIAGNOSIS — M1990 Unspecified osteoarthritis, unspecified site: Secondary | ICD-10-CM | POA: Diagnosis not present

## 2017-10-28 DIAGNOSIS — R748 Abnormal levels of other serum enzymes: Secondary | ICD-10-CM | POA: Diagnosis not present

## 2017-10-28 DIAGNOSIS — Z0000 Encounter for general adult medical examination without abnormal findings: Secondary | ICD-10-CM | POA: Diagnosis not present

## 2017-10-28 DIAGNOSIS — R319 Hematuria, unspecified: Secondary | ICD-10-CM | POA: Diagnosis not present

## 2017-10-28 DIAGNOSIS — N390 Urinary tract infection, site not specified: Secondary | ICD-10-CM | POA: Diagnosis not present

## 2017-10-28 DIAGNOSIS — M545 Low back pain: Secondary | ICD-10-CM | POA: Diagnosis not present

## 2017-10-28 DIAGNOSIS — M5416 Radiculopathy, lumbar region: Secondary | ICD-10-CM | POA: Diagnosis not present

## 2017-10-28 DIAGNOSIS — Z79899 Other long term (current) drug therapy: Secondary | ICD-10-CM | POA: Diagnosis not present

## 2017-10-28 DIAGNOSIS — M7062 Trochanteric bursitis, left hip: Secondary | ICD-10-CM | POA: Diagnosis not present

## 2017-10-28 DIAGNOSIS — I10 Essential (primary) hypertension: Secondary | ICD-10-CM | POA: Diagnosis not present

## 2017-10-28 DIAGNOSIS — M7551 Bursitis of right shoulder: Secondary | ICD-10-CM | POA: Diagnosis not present

## 2017-10-28 DIAGNOSIS — E785 Hyperlipidemia, unspecified: Secondary | ICD-10-CM | POA: Diagnosis not present

## 2017-10-28 DIAGNOSIS — M0609 Rheumatoid arthritis without rheumatoid factor, multiple sites: Secondary | ICD-10-CM | POA: Diagnosis not present

## 2017-11-01 ENCOUNTER — Other Ambulatory Visit: Payer: Self-pay

## 2017-11-01 NOTE — Patient Outreach (Signed)
[place] [facility]) Care Management  11/01/2017  [name] 1946-06-30 [id number]   Telephone call to patient for monthly call.  No answer.  HIPAA compliant voice message left.   Plan: RN CM will attempt patient again in the month of December.  Jone Baseman, RN, MSN [facility] Care Management Care Management Coordinator Direct Line [phone] Toll Free: [phone]  Fax: [phone]

## 2017-11-10 ENCOUNTER — Other Ambulatory Visit: Payer: Self-pay

## 2017-11-10 NOTE — Patient Outreach (Signed)
[facility]) Care Management  [place]  11/10/2017   [name] [birth year] [id number]  Subjective: Telephone call to patient for monthly call. Patient reports she is having terrible back pain.  She reports that she is now using a cane and rollator. She states that she had been taking tylenol too much and her liver enzymes are up.  She states she will be seeing the gastroenterologist for follow up with that. Patient states she now has hydrocodone for pain that was ordered by her arthritis doctor and only taking that about 2 times a day.  She states she is going to have to have surgery but waiting to hear from the neurosurgeon.  Patient states she is not as active due to pain but she is managing. Patient reports that her sugars are ok.   Discussed with patient pain management and continuing to maintain her blood sugars.    Objective:   Encounter Medications:  Outpatient Encounter Medications as of 11/10/2017  Medication Sig Note  . bumetanide (BUMEX) 0.5 MG tablet Take 0.5 mg by mouth 2 (two) times daily.     . Cetirizine HCl (ZYRTEC PO) Take 1 tablet by mouth daily as needed. For allergies 12/03/2015: Rarely takes  . cholecalciferol (VITAMIN D) 1000 units tablet Take 1,000 Units by mouth daily.   . diclofenac (VOLTAREN) 75 MG EC tablet Take 75 mg by mouth 2 (two) times daily.   Marland Kitchen diltiazem (CARDIZEM CD) 120 MG 24 hr capsule Take 120 mg by mouth daily.     . enalapril (VASOTEC) 20 MG tablet Take 20 mg by mouth daily.  10/31/2013: Received from: External Pharmacy  . FLUoxetine (PROZAC) 20 MG tablet Take 20 mg by mouth daily. Take 1/2 tablet daily then 1 tablet daily.   . fluticasone (FLONASE) 50 MCG/ACT nasal spray Place 1 spray into both nostrils daily as needed for allergies or rhinitis. Reported on 2/68/3419   . folic acid (FOLVITE) 1 MG tablet Take 2 mg by mouth daily. Reported on 03/17/2016   . gabapentin (NEURONTIN) 300 MG capsule Take 300 mg by mouth at  bedtime. 10/04/2017: Taking 300mg  TID.  Marland Kitchen HYDROcodone-acetaminophen (NORCO/VICODIN) 5-325 MG tablet Take 1 tablet by mouth every 6 (six) hours as needed for moderate pain.   Marland Kitchen ketorolac (ACULAR) 0.5 % ophthalmic solution 1 drop. Reported on 03/17/2016 06/27/2014: Received from: External Pharmacy  . lovastatin (MEVACOR) 40 MG tablet Take 40 mg by mouth at bedtime.  06/27/2014: Received from: External Pharmacy  . Magnesium 300 MG CAPS Take 300 mg by mouth every other day.   . metFORMIN (GLUCOPHAGE) 500 MG tablet Take 500-1,000 mg by mouth 2 (two) times daily with a meal. Take 1 tablet in the morning Take 2 tablets with dinner 09/21/2016: Patient taking 1 mid morning and two at night.   . methotrexate (RHEUMATREX) 2.5 MG tablet Take 12.5 mg by mouth once a week. Caution:Chemotherapy. Protect from light. 12/03/2015: sunday  . Multiple Vitamin (MULTIVITAMIN) capsule Take 1 capsule by mouth daily.     Marland Kitchen NASAL SALINE NA Place into the nose as needed.   Marland Kitchen ofloxacin (OCUFLOX) 0.3 % ophthalmic solution Reported on 03/17/2016 06/27/2014: Received from: External Pharmacy  . ranitidine (ZANTAC) 300 MG tablet Take 300 mg by mouth 2 (two) times daily.  10/31/2013: Received from: External Pharmacy  . traMADol (ULTRAM) 50 MG tablet Take 50 mg by mouth 2 (two) times daily as needed.    No facility-administered encounter medications on file as of 11/10/2017.  Functional Status:  No flowsheet data found.  Fall/Depression Screening: Fall Risk  09/06/2017 08/13/2017 06/29/2017  Falls in the past year? Yes Yes Yes  Number falls in past yr: - - -  Injury with Fall? - - -  Follow up - - -   PHQ 2/9 Scores 08/13/2017 06/29/2017 05/27/2017 05/04/2017 03/31/2017 03/08/2017 02/08/2017  PHQ - 2 Score 0 0 0 0 0 0 0    Assessment: Patient continues to benefit from care manager outreach for disease management and support.    Plan:  [facility] CM Care Plan Problem One     Most Recent Value  Care Plan Problem One  Knowledge Deficit Diabetes   Role Documenting the Problem One  Care Management Telephonic Coordinator  Care Plan for Problem One  Active  THN Long Term Goal   Patient will keep A1c 6.0 or less within 90 days.  THN Long Term Goal Start Date  09/06/17 [goal continued]  Interventions for Problem One Long Term Goal  RN CM encouraged to continue current regimen.  RN CM reiterated with patient importance of maintaining A1c and affects of diabetes    THN CM Care Plan Problem Two     Most Recent Value  Care Plan Problem Two  Chronic Back Pain  Role Documenting the Problem Two  Care Management Telephonic [place] for Problem Two  Active  THN CM Short Term Goal #1   Patient will report pain managed better with hydrocodone.  THN CM Short Term Goal #1 Start Date  11/10/17  Interventions for Short Term Goal #2   RN CM discussed with patient importance of pain medication and not taking over the recommended amount of hydrocodone.     RN CM will contact patient in the month of January and patient agrees to next outreach.  Jone Baseman, RN, MSN [facility] Care Management Care Management Coordinator Direct Line [phone] Toll Free: [phone]  Fax: [phone]

## 2017-11-11 DIAGNOSIS — H40013 Open angle with borderline findings, low risk, bilateral: Secondary | ICD-10-CM | POA: Diagnosis not present

## 2017-11-11 DIAGNOSIS — H04123 Dry eye syndrome of bilateral lacrimal glands: Secondary | ICD-10-CM | POA: Diagnosis not present

## 2017-12-01 ENCOUNTER — Other Ambulatory Visit: Payer: Self-pay

## 2017-12-01 ENCOUNTER — Ambulatory Visit: Payer: Medicare HMO | Admitting: Gastroenterology

## 2017-12-01 ENCOUNTER — Encounter: Payer: Self-pay | Admitting: Gastroenterology

## 2017-12-01 ENCOUNTER — Other Ambulatory Visit (INDEPENDENT_AMBULATORY_CARE_PROVIDER_SITE_OTHER): Payer: Medicare HMO

## 2017-12-01 VITALS — BP 150/60 | HR 68 | Ht <= 58 in | Wt 151.0 lb

## 2017-12-01 DIAGNOSIS — R7989 Other specified abnormal findings of blood chemistry: Secondary | ICD-10-CM

## 2017-12-01 DIAGNOSIS — R945 Abnormal results of liver function studies: Secondary | ICD-10-CM

## 2017-12-01 LAB — HEPATIC FUNCTION PANEL
ALBUMIN: 4.3 g/dL (ref 3.5–5.2)
ALT: 13 U/L (ref 0–35)
AST: 13 U/L (ref 0–37)
Alkaline Phosphatase: 84 U/L (ref 39–117)
Bilirubin, Direct: 0.1 mg/dL (ref 0.0–0.3)
TOTAL PROTEIN: 6.8 g/dL (ref 6.0–8.3)
Total Bilirubin: 0.6 mg/dL (ref 0.2–1.2)

## 2017-12-01 LAB — HEPATITIS B SURFACE ANTIBODY,QUALITATIVE: Hep B S Ab: NONREACTIVE

## 2017-12-01 LAB — HEPATITIS A ANTIBODY, TOTAL: HEPATITIS A AB,TOTAL: REACTIVE — AB

## 2017-12-01 LAB — HEPATITIS C ANTIBODY
Hepatitis C Ab: NONREACTIVE
SIGNAL TO CUT-OFF: 0.01 (ref <1.00)

## 2017-12-01 LAB — HEPATITIS B SURFACE ANTIGEN: HEP B S AG: NONREACTIVE

## 2017-12-01 NOTE — Patient Instructions (Signed)
Your physician has requested that you go to the basement for the following lab work before leaving today:   You have been scheduled for an abdominal ultrasound at [facility] (1st floor of hospital) on 12-03-17 at 9:00 am. Please arrive 15 minutes prior to your appointment for registration. Make certain not to have anything to eat or drink 6 hours prior to your appointment. Should you need to reschedule your appointment, please contact radiology at [phone]. This test typically takes about 30 minutes to perform.

## 2017-12-01 NOTE — Progress Notes (Signed)
Physician assistant initial assessment and plan reviewed

## 2017-12-01 NOTE — Progress Notes (Signed)
12/01/2017 [name] [id number] Nov 01, 1946   HISTORY OF PRESENT ILLNESS:  This is a pleasant 72 year old female who is known to Dr. Henrene Pastor for colonoscopy.  She is here today at the request of her PCP for evaluation of elevated liver enzymes.  On December 6 she was found to have an elevated alk phos at 152 and elevated ALT at 111.  AST was normal at 37 total bilirubin was normal at 0.4.  They had not been elevated on prior lab results that we had a from throughout earlier this year.  She tells me that just prior to having those labs drawn she had been on extremely large doses of Tylenol, taking 3-4 Tylenol every 3-4 hours for several weeks.  She says that since that time she has no longer taking Tylenol, but has been taking her prescribed doses of Norco for her back pain.  She has also been on methotrexate for the last 1.5-2 years.  She denies alcohol use.  She recalls being told only one other time that her LFTs were elevated.  She says that that was back in 2004 when she was in the hospital for a severe staph infection after a shoulder surgery.  They apparently normalized after that, what she describes sounds probably like a reactive type of hepatitis due to infection.  She denies any complaints besides her usual back and knee pain.    Past Medical History:  Diagnosis Date  . Arthritis   . Asthma   . Back pain   . Diabetes mellitus    type 2  . Family history of adverse reaction to anesthesia    my first cousin had difficulty waking up   . Fibroid    ovarian  . GERD (gastroesophageal reflux disease)   . History of staph infection    in hospital for 11 days/ in 2007  . Hyperlipidemia   . Hypertension   . Ovarian cyst   . Radiculopathy   . Wears glasses    Past Surgical History:  Procedure Laterality Date  . ABDOMINAL HYSTERECTOMY  1998   TAH,BSO  . ANTERIOR CERVICAL DECOMP/DISCECTOMY FUSION N/A 12/11/2015   Procedure: ANTERIOR CERVICAL DECOMPRESSION/DISCECTOMY FUSION 2  LEVELS;  Surgeon: Phylliss Bob, MD;  Location: [place];  Service: Orthopedics;  Laterality: N/A;  Anterior cervical decompression fusion, cervical 6-7, cervical 7-thoracic 1 with instrumentation and allograft  . BACK SURGERY     "NECK DISC" SURGERY  . CARDIAC CATHETERIZATION  1998  . CATARACT EXTRACTION W/ INTRAOCULAR LENS  IMPLANT, BILATERAL  2015   Bil  . COLONOSCOPY    . LAPAROSCOPIC OVARIAN CYSTECTOMY  1970  . ROTATOR CUFF REPAIR  02/2003   right shoulder  . SHOULDER SURGERY  10/2003  . thumb surgery  2010   right thumb  . TONSILLECTOMY    . TUBAL LIGATION      reports that she quit smoking about 25 years ago. Her smoking use included cigarettes. she has never used smokeless tobacco. She reports that she does not drink alcohol or use drugs. family history includes Cancer in her paternal aunt; Diabetes in her brother, brother, paternal uncle, and sister; Heart disease in her maternal grandmother; Hypertension in her maternal grandmother. Allergies  Allergen Reactions  . Ivp Dye [Iodinated Diagnostic Agents]     Caused nausea and vomiting  . Prednisone     REACTION: reflux      Outpatient Encounter Medications as of 12/01/2017  Medication Sig  . bumetanide (  BUMEX) 0.5 MG tablet Take 0.5 mg by mouth 2 (two) times daily.    . Cetirizine HCl (ZYRTEC PO) Take 1 tablet by mouth daily as needed. For allergies  . cholecalciferol (VITAMIN D) 1000 units tablet Take 1,000 Units by mouth daily.  . diclofenac (VOLTAREN) 75 MG EC tablet Take 75 mg by mouth 2 (two) times daily.  Marland Kitchen diltiazem (CARDIZEM CD) 120 MG 24 hr capsule Take 120 mg by mouth daily.    . enalapril (VASOTEC) 20 MG tablet Take 20 mg by mouth daily.   . folic acid (FOLVITE) 1 MG tablet Take 2 mg by mouth daily. Reported on 03/17/2016  . gabapentin (NEURONTIN) 300 MG capsule Take 300 mg by mouth at bedtime.  Marland Kitchen HYDROcodone-acetaminophen (NORCO/VICODIN) 5-325 MG tablet Take 1 tablet by mouth every 6 (six) hours as needed for  moderate pain.  Marland Kitchen lovastatin (MEVACOR) 40 MG tablet Take 40 mg by mouth at bedtime.   . Magnesium 300 MG CAPS Take 300 mg by mouth every other day.  . metFORMIN (GLUCOPHAGE) 500 MG tablet Take 500-1,000 mg by mouth 2 (two) times daily with a meal. Take 1 tablet in the morning Take 2 tablets with dinner  . methotrexate (RHEUMATREX) 2.5 MG tablet Take 12.5 mg by mouth once a week. Caution:Chemotherapy. Protect from light.  . Multiple Vitamin (MULTIVITAMIN) capsule Take 1 capsule by mouth daily.    Marland Kitchen NASAL SALINE NA Place into the nose as needed.  . ranitidine (ZANTAC) 300 MG tablet Take 300 mg by mouth 2 (two) times daily.   . [DISCONTINUED] FLUoxetine (PROZAC) 20 MG tablet Take 20 mg by mouth daily. Take 1/2 tablet daily then 1 tablet daily.  . [DISCONTINUED] fluticasone (FLONASE) 50 MCG/ACT nasal spray Place 1 spray into both nostrils daily as needed for allergies or rhinitis. Reported on 03/17/2016  . [DISCONTINUED] ketorolac (ACULAR) 0.5 % ophthalmic solution 1 drop. Reported on 03/17/2016  . [DISCONTINUED] ofloxacin (OCUFLOX) 0.3 % ophthalmic solution Reported on 03/17/2016  . [DISCONTINUED] traMADol (ULTRAM) 50 MG tablet Take 50 mg by mouth 2 (two) times daily as needed.   No facility-administered encounter medications on file as of 12/01/2017.      REVIEW OF SYSTEMS  : All other systems reviewed and negative except where noted in the History of Present Illness.   PHYSICAL EXAM: BP (!) 150/60   Pulse 68   Ht '4\' 8"'  (1.422 m)   Wt 151 lb (68.5 kg)   BMI 33.85 kg/m  General: Well developed black female in no acute distress Head: Normocephalic and atraumatic Eyes:  Sclerae anicteric, conjunctiva pink. Ears: Normal auditory acuity Lungs: Clear throughout to auscultation; no increased WOB. Heart: Regular rate and rhythm; no M/R/G. Abdomen: Soft, non-distended.  BS present.  Non-tender. Musculoskeletal: Symmetrical with no gross deformities  Skin: No lesions on visible  extremities Extremities: No edema  Neurological: Alert oriented x 4, grossly non-focal Psychological:  Alert and cooperative. Normal mood and affect  ASSESSMENT AND PLAN: *Elevated LFT's:  Only one reading with mildly elevated ALT and ALP one month ago.  Had been taking large amounts of tylenol prior to that and has also been on methotrexate for 1.5-2 years.  No ETOH use.  Will start by repeating LFT's now that she has been off of the tylenol for one month.  Will also check viral hepatitis studies.  Will order RUQ abdominal ultrasound.   CC:  Valinda Party, MD

## 2017-12-01 NOTE — Patient Outreach (Signed)
[facility]) Care Management  [place]  12/01/2017   [name] 1946-09-26 [id number]  Subjective: Telephone call to patient for monthly call.  Patient reports she is making it. She reports she is going to the neurosurgeon on 12-03-17 for her back.  She reports that pain is better controlled with the hydrocodone and is taking 2 tabs per day.  She also saw the GI doctor for increased liver enzymes and will have a ultrasound of her liver.  She states the GI doctor did some test and will wait on testing before doing anything else. Patient reports that her sugars are good and denies any problems.  Encouraged patient to continue her routine in controlling her sugars.  She verbalized understanding.     Objective:   Encounter Medications:  Outpatient Encounter Medications as of 12/01/2017  Medication Sig Note  . bumetanide (BUMEX) 0.5 MG tablet Take 0.5 mg by mouth 2 (two) times daily.     . Cetirizine HCl (ZYRTEC PO) Take 1 tablet by mouth daily as needed. For allergies 12/03/2015: Rarely takes  . cholecalciferol (VITAMIN D) 1000 units tablet Take 1,000 Units by mouth daily.   . diclofenac (VOLTAREN) 75 MG EC tablet Take 75 mg by mouth 2 (two) times daily.   Marland Kitchen diltiazem (CARDIZEM CD) 120 MG 24 hr capsule Take 120 mg by mouth daily.     . enalapril (VASOTEC) 20 MG tablet Take 20 mg by mouth daily.  10/31/2013: Received from: External Pharmacy  . folic acid (FOLVITE) 1 MG tablet Take 2 mg by mouth daily. Reported on 03/17/2016   . gabapentin (NEURONTIN) 300 MG capsule Take 300 mg by mouth at bedtime. 10/04/2017: Taking 300mg  TID.  Marland Kitchen HYDROcodone-acetaminophen (NORCO/VICODIN) 5-325 MG tablet Take 1 tablet by mouth every 6 (six) hours as needed for moderate pain.   Marland Kitchen lovastatin (MEVACOR) 40 MG tablet Take 40 mg by mouth at bedtime.  06/27/2014: Received from: External Pharmacy  . Magnesium 300 MG CAPS Take 300 mg by mouth every other day.   . metFORMIN (GLUCOPHAGE) 500 MG tablet  Take 500-1,000 mg by mouth 2 (two) times daily with a meal. Take 1 tablet in the morning Take 2 tablets with dinner 09/21/2016: Patient taking 1 mid morning and two at night.   . methotrexate (RHEUMATREX) 2.5 MG tablet Take 12.5 mg by mouth once a week. Caution:Chemotherapy. Protect from light. 12/03/2015: sunday  . Multiple Vitamin (MULTIVITAMIN) capsule Take 1 capsule by mouth daily.     Marland Kitchen NASAL SALINE NA Place into the nose as needed.   . ranitidine (ZANTAC) 300 MG tablet Take 300 mg by mouth 2 (two) times daily.  10/31/2013: Received from: External Pharmacy   No facility-administered encounter medications on file as of 12/01/2017.     Functional Status:  In your present state of health, do you have any difficulty performing the following activities: 12/01/2017  Hearing? N  Vision? N  Difficulty concentrating or making decisions? N  Walking or climbing stairs? Y  Dressing or bathing? N  Doing errands, shopping? Y  Comment back pain  Preparing Food and eating ? N  Using the Toilet? N  In the past six months, have you accidently leaked urine? N  Do you have problems with loss of bowel control? N  Managing your Medications? N  Managing your Finances? N  Housekeeping or managing your Housekeeping? N  Some recent data might be hidden    Fall/Depression Screening: Fall Risk  12/01/2017 09/06/2017 08/13/2017  Falls in the past year? Yes Yes Yes  Number falls in past yr: 1 - -  Injury with Fall? - - -  Follow up - - -   PHQ 2/9 Scores 12/01/2017 08/13/2017 06/29/2017 05/27/2017 05/04/2017 03/31/2017 03/08/2017  PHQ - 2 Score 0 0 0 0 0 0 0    Assessment: Patient continues to benefit from care manager outreach for disease management and support.   Plan:  [facility] CM Care Plan Problem One     Most Recent Value  Care Plan Problem One  Knowledge Deficit Diabetes  Role Documenting the Problem One  Care Management Telephonic Coordinator  Care Plan for Problem One  Active  THN Long Term Goal   Patient will  keep A1c 6.0 or less within 90 days.  THN Long Term Goal Start Date  12/01/17 [goal continued]  Interventions for Problem One Long Term Goal  RN CM reinforced to continue current regimen.  RN CM reiterated with patient importance of maintaining A1c and affects of diabetes    THN CM Care Plan Problem Two     Most Recent Value  Care Plan Problem Two  Chronic Back Pain  Role Documenting the Problem Two  Care Management Telephonic [place] for Problem Two  Active  THN CM Short Term Goal #1   Patient will report pain managed better with hydrocodone.  THN CM Short Term Goal #1 Start Date  12/01/17  Interventions for Short Term Goal #2   RN CM reinforced with patient importance of pain medication and not taking over the recommended amount of hydrocodone.      RN CM will contact patient in the month of February and patient agrees to next outreach.  Jone Baseman, RN, MSN [facility] Care Management Care Management Coordinator Direct Line [phone] Toll Free: [phone]  Fax: [phone]

## 2017-12-03 ENCOUNTER — Ambulatory Visit (HOSPITAL_COMMUNITY): Payer: Medicare HMO

## 2017-12-03 DIAGNOSIS — M545 Low back pain: Secondary | ICD-10-CM | POA: Diagnosis not present

## 2017-12-03 DIAGNOSIS — M25551 Pain in right hip: Secondary | ICD-10-CM | POA: Diagnosis not present

## 2017-12-03 DIAGNOSIS — M4807 Spinal stenosis, lumbosacral region: Secondary | ICD-10-CM | POA: Diagnosis not present

## 2017-12-03 DIAGNOSIS — M25552 Pain in left hip: Secondary | ICD-10-CM | POA: Diagnosis not present

## 2017-12-09 ENCOUNTER — Ambulatory Visit (HOSPITAL_COMMUNITY)
Admission: RE | Admit: 2017-12-09 | Discharge: 2017-12-09 | Disposition: A | Discharge status: Home / Self Care | Payer: Medicare HMO | Source: Ambulatory Visit | Attending: Gastroenterology | Admitting: Gastroenterology

## 2017-12-09 DIAGNOSIS — K7689 Other specified diseases of liver: Secondary | ICD-10-CM | POA: Diagnosis not present

## 2017-12-09 DIAGNOSIS — R945 Abnormal results of liver function studies: Secondary | ICD-10-CM | POA: Insufficient documentation

## 2017-12-09 DIAGNOSIS — R7989 Other specified abnormal findings of blood chemistry: Secondary | ICD-10-CM

## 2017-12-16 DIAGNOSIS — M5416 Radiculopathy, lumbar region: Secondary | ICD-10-CM | POA: Diagnosis not present

## 2017-12-16 DIAGNOSIS — M545 Low back pain: Secondary | ICD-10-CM | POA: Diagnosis not present

## 2017-12-16 DIAGNOSIS — M25569 Pain in unspecified knee: Secondary | ICD-10-CM | POA: Diagnosis not present

## 2017-12-16 DIAGNOSIS — M1990 Unspecified osteoarthritis, unspecified site: Secondary | ICD-10-CM | POA: Diagnosis not present

## 2017-12-16 DIAGNOSIS — Z79899 Other long term (current) drug therapy: Secondary | ICD-10-CM | POA: Diagnosis not present

## 2017-12-16 DIAGNOSIS — M25559 Pain in unspecified hip: Secondary | ICD-10-CM | POA: Diagnosis not present

## 2017-12-16 DIAGNOSIS — M0609 Rheumatoid arthritis without rheumatoid factor, multiple sites: Secondary | ICD-10-CM | POA: Diagnosis not present

## 2017-12-21 ENCOUNTER — Other Ambulatory Visit: Payer: Self-pay | Admitting: Neurosurgery

## 2017-12-21 DIAGNOSIS — M4807 Spinal stenosis, lumbosacral region: Secondary | ICD-10-CM

## 2017-12-21 DIAGNOSIS — Z0000 Encounter for general adult medical examination without abnormal findings: Secondary | ICD-10-CM | POA: Diagnosis not present

## 2017-12-21 DIAGNOSIS — I10 Essential (primary) hypertension: Secondary | ICD-10-CM | POA: Diagnosis not present

## 2017-12-21 DIAGNOSIS — R748 Abnormal levels of other serum enzymes: Secondary | ICD-10-CM | POA: Diagnosis not present

## 2017-12-21 DIAGNOSIS — E1122 Type 2 diabetes mellitus with diabetic chronic kidney disease: Secondary | ICD-10-CM | POA: Diagnosis not present

## 2017-12-21 DIAGNOSIS — E7800 Pure hypercholesterolemia, unspecified: Secondary | ICD-10-CM | POA: Diagnosis not present

## 2017-12-29 ENCOUNTER — Other Ambulatory Visit: Payer: Self-pay

## 2017-12-29 NOTE — Patient Outreach (Signed)
[facility]) Care Management  [place]  12/29/2017   [name] 17-Jun-1946 [id number]  Subjective: Telephone call to patient for monthly call.  Patient reports that she is doing ok but states that her back is still bothering her.  She reports pain at a 7 today.  She reports she is taking the hydrocodone about 2 times a day. Patient to have myelogram done next week for and she is looking forward to some answers about her back pain with that.  Patient reports seeing primary doctor for wellness visit last week.  She reports no changes with medications. She reports that her A1c was 5.7, which is less than before.  Patient continues to work to get A1c down.  She reports that her blood sugars are between 86-100 most of the time.  Patient admits less appetite since her back has been bothering her.  Asked patient about her liver enzymes.  She reports that repeat testing showed normal and increase was attributed to her over use of tylenol for her back pain.  Discussed with patient importance of correct medication use.  She verbalized understanding.    Objective:   Encounter Medications:  Outpatient Encounter Medications as of 12/29/2017  Medication Sig Note  . bumetanide (BUMEX) 0.5 MG tablet Take 0.5 mg by mouth 2 (two) times daily.     . Cetirizine HCl (ZYRTEC PO) Take 1 tablet by mouth daily as needed. For allergies 12/03/2015: Rarely takes  . cholecalciferol (VITAMIN D) 1000 units tablet Take 1,000 Units by mouth daily.   . diclofenac (VOLTAREN) 75 MG EC tablet Take 75 mg by mouth 2 (two) times daily.   Marland Kitchen diltiazem (CARDIZEM CD) 120 MG 24 hr capsule Take 120 mg by mouth daily.     . enalapril (VASOTEC) 20 MG tablet Take 20 mg by mouth daily.  10/31/2013: Received from: External Pharmacy  . folic acid (FOLVITE) 1 MG tablet Take 2 mg by mouth daily. Reported on 03/17/2016   . gabapentin (NEURONTIN) 300 MG capsule Take 300 mg by mouth at bedtime. 10/04/2017: Taking 300mg  TID.   Marland Kitchen HYDROcodone-acetaminophen (NORCO/VICODIN) 5-325 MG tablet Take 1 tablet by mouth every 6 (six) hours as needed for moderate pain.   Marland Kitchen lovastatin (MEVACOR) 40 MG tablet Take 40 mg by mouth at bedtime.  06/27/2014: Received from: External Pharmacy  . Magnesium 300 MG CAPS Take 300 mg by mouth every other day.   . metFORMIN (GLUCOPHAGE) 500 MG tablet Take 500-1,000 mg by mouth 2 (two) times daily with a meal. Take 1 tablet in the morning Take 2 tablets with dinner 09/21/2016: Patient taking 1 mid morning and two at night.   . methotrexate (RHEUMATREX) 2.5 MG tablet Take 12.5 mg by mouth once a week. Caution:Chemotherapy. Protect from light. 12/03/2015: sunday  . Multiple Vitamin (MULTIVITAMIN) capsule Take 1 capsule by mouth daily.     Marland Kitchen NASAL SALINE NA Place into the nose as needed.   . ranitidine (ZANTAC) 300 MG tablet Take 300 mg by mouth 2 (two) times daily.  10/31/2013: Received from: External Pharmacy   No facility-administered encounter medications on file as of 12/29/2017.     Functional Status:  In your present state of health, do you have any difficulty performing the following activities: 12/29/2017 12/01/2017  Hearing? N N  Vision? N N  Difficulty concentrating or making decisions? N N  Walking or climbing stairs? Y Y  Dressing or bathing? N N  Doing errands, shopping? Tempie Donning  Comment -  back pain  Preparing Food and eating ? N N  Using the Toilet? N N  In the past six months, have you accidently leaked urine? N N  Do you have problems with loss of bowel control? N N  Managing your Medications? N N  Managing your Finances? N N  Housekeeping or managing your Housekeeping? N N  Some recent data might be hidden    Fall/Depression Screening: Fall Risk  12/29/2017 12/01/2017 09/06/2017  Falls in the past year? Yes Yes Yes  Number falls in past yr: 1 1 -  Injury with Fall? - - -  Follow up - - -   PHQ 2/9 Scores 12/29/2017 12/01/2017 08/13/2017 06/29/2017 05/27/2017 05/04/2017 03/31/2017  PHQ - 2  Score 0 0 0 0 0 0 0    Assessment: Patient continues to benefit from care manager outreach for disease management and support.    Plan:  [facility] CM Care Plan Problem One     Most Recent Value  Care Plan Problem One  Knowledge Deficit Diabetes  Role Documenting the Problem One  Care Management Telephonic Coordinator  Care Plan for Problem One  Active  THN Long Term Goal   Patient will report A1c 5.5 or less within 90 days.  THN Long Term Goal Start Date  12/01/17 [goal continued]  Interventions for Problem One Long Term Goal  Patient watching her diet, limiting carbohydrates.    [facility] CM Care Plan Problem Two     Most Recent Value  Care Plan Problem Two  Chronic Back Pain  Role Documenting the Problem Two  Care Management Telephonic [place] for Problem Two  Active  THN CM Short Term Goal #1   Patient will report pain managed better with hydrocodone within 30 days.  THN CM Short Term Goal #1 Start Date  12/29/17  Interventions for Short Term Goal #2   RN CM discussed taking pain medications as ordered.  Patient utilizing assistive devices to ambulate to add support.   THN CM Short Term Goal #2   Patient will report having myleogram done and following up with neurosurgeon within 30 days.   THN CM Short Term Goal #2 Start Date  12/29/17  Interventions for Short Term Goal #2  RN CM discussed with patient importance of going to appointments for follow up as scheduled.       RN CM will contact patient in the month of March and patient agrees to next outreach.  Jone Baseman, RN, MSN [facility] Care Management Care Management Coordinator Direct Line [phone] Toll Free: [phone]  Fax: [phone]

## 2018-01-05 ENCOUNTER — Ambulatory Visit
Admission: RE | Admit: 2018-01-05 | Discharge: 2018-01-05 | Disposition: A | Discharge status: Home / Self Care | Payer: Medicare HMO | Source: Ambulatory Visit | Attending: Neurosurgery | Admitting: Neurosurgery

## 2018-01-05 DIAGNOSIS — M4807 Spinal stenosis, lumbosacral region: Secondary | ICD-10-CM

## 2018-01-05 DIAGNOSIS — M5137 Other intervertebral disc degeneration, lumbosacral region: Secondary | ICD-10-CM | POA: Diagnosis not present

## 2018-01-05 MED ORDER — IOPAMIDOL (ISOVUE-M 200) INJECTION 41%
15.0000 mL | Freq: Once | INTRAMUSCULAR | Status: AC
  Administered 2018-01-05: 15 mL via INTRATHECAL

## 2018-01-05 MED ORDER — MEPERIDINE HCL 50 MG/ML IJ SOLN
50.0000 mg | Freq: Once | INTRAMUSCULAR | Status: AC
  Administered 2018-01-05: 50 mg via INTRAMUSCULAR

## 2018-01-05 MED ORDER — ONDANSETRON HCL 4 MG/2ML IJ SOLN
4.0000 mg | Freq: Once | INTRAMUSCULAR | Status: AC
  Administered 2018-01-05: 4 mg via INTRAMUSCULAR

## 2018-01-05 MED ORDER — DIAZEPAM 5 MG PO TABS
5.0000 mg | ORAL_TABLET | Freq: Once | ORAL | Status: AC
  Administered 2018-01-05: 5 mg via ORAL

## 2018-01-05 NOTE — Progress Notes (Signed)
Patient states she took Benadryl 50mg  PO as instructed. Brita Romp, RN

## 2018-01-05 NOTE — Discharge Instructions (Signed)

## 2018-01-18 DIAGNOSIS — Z6832 Body mass index (BMI) 32.0-32.9, adult: Secondary | ICD-10-CM | POA: Diagnosis not present

## 2018-01-18 DIAGNOSIS — M48062 Spinal stenosis, lumbar region with neurogenic claudication: Secondary | ICD-10-CM | POA: Diagnosis not present

## 2018-01-18 DIAGNOSIS — I10 Essential (primary) hypertension: Secondary | ICD-10-CM | POA: Diagnosis not present

## 2018-01-21 ENCOUNTER — Other Ambulatory Visit: Payer: Self-pay | Admitting: Neurosurgery

## 2018-01-26 ENCOUNTER — Other Ambulatory Visit: Payer: Self-pay

## 2018-01-26 NOTE — Patient Outreach (Signed)
[facility]) Care Management  [place]  01/26/2018   [name] August 20, 1946 [id number]  Subjective: Telephone call to patient for monthly call.  Patient had her myelogram done and patient has 3 pinched nerves in her back. Patient to have surgery on 01/31/18.  Patient looking forward to surgery and praying for less pain.  Discussed with patient completing pre-op requirements.  Patient reports that her blood sugars are doing good.  No problems voiced.  Objective:   Encounter Medications:  Outpatient Encounter Medications as of 01/26/2018  Medication Sig Note  . bumetanide (BUMEX) 0.5 MG tablet Take 0.5 mg by mouth 2 (two) times daily.     . cetirizine (ZYRTEC) 10 MG tablet Take 10 mg by mouth daily as needed for allergies (during Spring/Summer).   . cholecalciferol (VITAMIN D) 1000 units tablet Take 1,000 Units by mouth daily.   . diclofenac (VOLTAREN) 75 MG EC tablet Take 75 mg by mouth 2 (two) times daily.   Marland Kitchen diltiazem (CARDIZEM CD) 120 MG 24 hr capsule Take 120 mg by mouth daily before breakfast.    . enalapril (VASOTEC) 20 MG tablet Take 20 mg by mouth at bedtime.    . folic acid (FOLVITE) 741 MCG tablet Take 800 mcg by mouth daily.   Marland Kitchen gabapentin (NEURONTIN) 300 MG capsule Take 300 mg by mouth 2 (two) times daily.    Marland Kitchen HYDROcodone-acetaminophen (NORCO/VICODIN) 5-325 MG tablet Take 1 tablet by mouth every 6 (six) hours as needed for moderate pain.   Marland Kitchen lovastatin (MEVACOR) 40 MG tablet Take 40 mg by mouth at bedtime.    . Magnesium 300 MG CAPS Take 300 mg by mouth every other day.   . metFORMIN (GLUCOPHAGE-XR) 500 MG 24 hr tablet Take 500-1,000 mg by mouth 2 (two) times daily. Take 1 tablet (500 mg) in the morning and take 2 tablets (1000 mg) with supper   . methotrexate (RHEUMATREX) 2.5 MG tablet Take 12.5 mg by mouth every Sunday at [place]. Protect from light.    . Multiple Vitamin (MULTIVITAMIN WITH MINERALS) TABS tablet Take 1 tablet by  mouth daily.   . ranitidine (ZANTAC) 300 MG tablet Take 300 mg by mouth 2 (two) times daily. Lunch & supper   . sodium chloride (OCEAN) 0.65 % SOLN nasal spray Place 1-2 sprays into the nose 4 (four) times daily as needed for congestion.   Marland Kitchen aspirin EC 81 MG tablet Take 81 mg by mouth daily. 01/24/2018: On hold due to upcoming procedure.   No facility-administered encounter medications on file as of 01/26/2018.     Functional Status:  In your present state of health, do you have any difficulty performing the following activities: 12/29/2017 12/01/2017  Hearing? N N  Vision? N N  Difficulty concentrating or making decisions? N N  Walking or climbing stairs? Y Y  Dressing or bathing? N N  Doing errands, shopping? Y Y  Comment - back pain  Preparing Food and eating ? N N  Using the Toilet? N N  In the past six months, have you accidently leaked urine? N N  Do you have problems with loss of bowel control? N N  Managing your Medications? N N  Managing your Finances? N N  Housekeeping or managing your Housekeeping? N N  Some recent data might be hidden    Fall/Depression Screening: Fall Risk  12/29/2017 12/01/2017 09/06/2017  Falls in the past year? Yes Yes Yes  Number falls in past yr: 1 1 -  Injury with Fall? - - -  Follow up - - -   PHQ 2/9 Scores 12/29/2017 12/01/2017 08/13/2017 06/29/2017 05/27/2017 05/04/2017 03/31/2017  PHQ - 2 Score 0 0 0 0 0 0 0    Assessment: Patient continues to benefit from care manager outreach for disease management and support.    Plan:  [facility] CM Care Plan Problem One     Most Recent Value  Care Plan Problem One  Knowledge Deficit Diabetes  Role Documenting the Problem One  Care Management Telephonic Coordinator  Care Plan for Problem One  Active  [facility] Long Term Goal   Patient will report A1c 5.5 or less within 90 days.  THN Long Term Goal Start Date  12/01/17 [goal continued]  Interventions for Problem One Long Term Goal  Patient continues to monitor her sugars and  watch her diet.     [facility] CM Care Plan Problem Two     Most Recent Value  Care Plan Problem Two  Chronic Back Pain  Role Documenting the Problem Two  Care Management Telephonic [place] for Problem Two  Active  THN CM Short Term Goal #1   Patient will report pain managed better with hydrocodone within 30 days.  [facility]N CM Short Term Goal #1 Start Date  12/29/17  Interventions for Short Term Goal #2   Patient taking medication as ordered. Patient using heating pad as needed to assist with pain.    THN CM Short Term Goal #2   Patient will report having myleogram done and following up with neurosurgeon within 30 days.   [facility]N CM Short Term Goal #2 Start Date  12/29/17  [facility] CM Short Term Goal #2 Met Date  01/26/18  Interventions for Short Term Goal #2  Patient had myelogram completed.    THN CM Short Term Goal #3   Patient will have back surgery within the next 7 days.  [facility]N CM Short Term Goal #3 Start Date  01/26/18  Interventions for Short Term Goal #3  Discussed with patient importance of preparing for surgery.  Patient to complete pre-op  requirements.       RN CM will contact patient once she is discharged for transition of care calls and patient agrees to transition of care calls.   Jone Baseman, RN, MSN [facility] Care Management Care Management Coordinator Direct Line [phone] Toll Free: [phone]  Fax: [phone]

## 2018-01-27 NOTE — Pre-Procedure Instructions (Signed)
[name]  01/27/2018      [facility] [name], [place] [address] Phone: [phone] Fax: [phone]  [facility] [address], [place] [address] Phone: [phone] Fax: [phone]    Your procedure is scheduled on Monday March 11.  [facility] to [facility] at 8:30 A.M.  Call this number if you have problems the morning of surgery:  [phone]   Remember:  Do not eat food or drink liquids after midnight.  Take these medicines the morning of surgery with A SIP OF WATER:   Diltiazem (Cardizem) Gabapentin (neurontin) Hydrocodone-acetaminophen (Norco) if needed Ranitidine (zantac) Cetirizine (zyrtec) Nasal spray if needed  7 days prior to surgery STOP taking any Aleve, Naproxen, Ibuprofen, Motrin, Advil, Goody's, BC's, all herbal medications, fish oil, and all vitamins  **Follow your surgeon's instructions on stopping Aspirin. If no instructions were given, please call your surgeon's office.**    Do not wear jewelry, make-up or nail polish.  Do not wear lotions, powders, or perfumes, or deodorant.  Do not shave 48 hours prior to surgery.  Men may shave face and neck.  Do not bring valuables to the hospital.  [facility] is not responsible for any belongings or valuables.  Contacts, dentures or bridgework may not be worn into surgery.  Leave your suitcase in the car.  After surgery it may be brought to your room.  For patients admitted to the hospital, discharge time will be determined by your treatment team.  Patients discharged the day of surgery will not be allowed to drive home.    Special instructions:    [place]- Preparing For Surgery  Before surgery, you can play an important role. Because skin is not sterile, your skin needs to be as free of germs as  possible. You can reduce the number of germs on your skin by washing with CHG (chlorahexidine gluconate) Soap before surgery.  CHG is an antiseptic cleaner which kills germs and bonds with the skin to continue killing germs even after washing.  Please do not use if you have an allergy to CHG or antibacterial soaps. If your skin becomes reddened/irritated stop using the CHG.  Do not shave (including legs and underarms) for at least 48 hours prior to first CHG shower. It is OK to shave your face.  Please follow these instructions carefully.   1. Shower the NIGHT BEFORE SURGERY and the MORNING OF SURGERY with CHG.   2. If you chose to wash your hair, wash your hair first as usual with your normal shampoo.  3. After you shampoo, rinse your hair and body thoroughly to remove the shampoo.  4. Use CHG as you would any other liquid soap. You can apply CHG directly to the skin and wash gently with a scrungie or a clean washcloth.   5. Apply the CHG Soap to your body ONLY FROM THE NECK DOWN.  Do not use on open wounds or open sores. Avoid contact with your eyes, ears, mouth and genitals (private parts). Wash Face and genitals (private parts)  with your normal soap.  6. Wash thoroughly, paying special attention to the area where your surgery will be performed.  7. Thoroughly rinse your body with warm water from the neck down.  8. DO NOT shower/wash with  your normal soap after using and rinsing off the CHG Soap.  9. Pat yourself dry with a CLEAN TOWEL.  10. Wear CLEAN PAJAMAS to bed the night before surgery, wear comfortable clothes the morning of surgery  11. Place CLEAN SHEETS on your bed the night of your first shower and DO NOT SLEEP WITH PETS.    Day of Surgery: Do not apply any deodorants/lotions. Please wear clean clothes to the hospital/surgery center.      Please read over the following fact sheets that you were given. Coughing and Deep Breathing and Surgical Site Infection  Prevention

## 2018-01-28 ENCOUNTER — Other Ambulatory Visit: Payer: Self-pay

## 2018-01-28 ENCOUNTER — Encounter (HOSPITAL_COMMUNITY): Payer: Self-pay

## 2018-01-28 ENCOUNTER — Encounter (HOSPITAL_COMMUNITY)
Admission: RE | Admit: 2018-01-28 | Discharge: 2018-01-28 | Disposition: A | Discharge status: Home / Self Care | Payer: Medicare HMO | Source: Ambulatory Visit | Attending: Neurosurgery | Admitting: Neurosurgery

## 2018-01-28 DIAGNOSIS — M48062 Spinal stenosis, lumbar region with neurogenic claudication: Secondary | ICD-10-CM | POA: Diagnosis not present

## 2018-01-28 DIAGNOSIS — Z7984 Long term (current) use of oral hypoglycemic drugs: Secondary | ICD-10-CM | POA: Diagnosis not present

## 2018-01-28 DIAGNOSIS — K219 Gastro-esophageal reflux disease without esophagitis: Secondary | ICD-10-CM | POA: Diagnosis not present

## 2018-01-28 DIAGNOSIS — I10 Essential (primary) hypertension: Secondary | ICD-10-CM | POA: Diagnosis not present

## 2018-01-28 DIAGNOSIS — M069 Rheumatoid arthritis, unspecified: Secondary | ICD-10-CM | POA: Diagnosis not present

## 2018-01-28 DIAGNOSIS — E119 Type 2 diabetes mellitus without complications: Secondary | ICD-10-CM | POA: Diagnosis not present

## 2018-01-28 DIAGNOSIS — Z87891 Personal history of nicotine dependence: Secondary | ICD-10-CM | POA: Diagnosis not present

## 2018-01-28 DIAGNOSIS — M5416 Radiculopathy, lumbar region: Secondary | ICD-10-CM | POA: Diagnosis not present

## 2018-01-28 DIAGNOSIS — Z79899 Other long term (current) drug therapy: Secondary | ICD-10-CM | POA: Diagnosis not present

## 2018-01-28 HISTORY — DX: Cardiac arrhythmia, unspecified: I49.9

## 2018-01-28 HISTORY — DX: Dyspnea, unspecified: R06.00

## 2018-01-28 LAB — BASIC METABOLIC PANEL
ANION GAP: 12 (ref 5–15)
BUN: 13 mg/dL (ref 6–20)
CHLORIDE: 103 mmol/L (ref 101–111)
CO2: 28 mmol/L (ref 22–32)
Calcium: 9.8 mg/dL (ref 8.9–10.3)
Creatinine, Ser: 0.69 mg/dL (ref 0.44–1.00)
GFR calc Af Amer: >60 mL/min (ref >60)
GFR calc non Af Amer: >60 mL/min (ref >60)
GLUCOSE: 97 mg/dL (ref 65–99)
POTASSIUM: 3.1 mmol/L — AB (ref 3.5–5.1)
Sodium: 143 mmol/L (ref 135–145)

## 2018-01-28 LAB — CBC
HEMATOCRIT: 36.8 % (ref 36.0–46.0)
HEMOGLOBIN: 11.8 g/dL — AB (ref 12.0–15.0)
MCH: 26.5 pg (ref 26.0–34.0)
MCHC: 32.1 g/dL (ref 30.0–36.0)
MCV: 82.7 fL (ref 78.0–100.0)
Platelets: 485 10*3/uL — ABNORMAL HIGH (ref 150–400)
RBC: 4.45 MIL/uL (ref 3.87–5.11)
RDW: 17.8 % — AB (ref 11.5–15.5)
WBC: 10.2 10*3/uL (ref 4.0–10.5)

## 2018-01-28 LAB — GLUCOSE, CAPILLARY: GLUCOSE-CAPILLARY: 97 mg/dL (ref 65–99)

## 2018-01-28 LAB — SURGICAL PCR SCREEN
MRSA, PCR: NEGATIVE
Staphylococcus aureus: POSITIVE — AB

## 2018-01-28 NOTE — Progress Notes (Addendum)
PCP is Dr Thressa Sheller, but since he has been sick she is being moved to Dr Maudie Mercury States she saw Dr Levy Sjogren many years ago and had a heart cath in 1998 Denies recent cardiac care. Reports last aspirin was Feb 28th Fasting CBG's run 85-100 Denies chest pain, cough, or fever.  Anne Rankins, NP called and informed of EKG

## 2018-01-28 NOTE — Progress Notes (Signed)
Anesthesia Chart Review:  Pt is a 72 year old female scheduled for L2-3, L3-4, L4-5 laminectomy and foraminotomy on 01/31/2018 with Anthoney Harada, MD  - PCP is Thressa Sheller, MD  PMH includes: Dysrhythmia (unspecified), HTN, DM, hyperlipidemia, asthma, GERD.  Former smoker (quit in 1994).  BMI 32.5.  S/p ACDF 12/11/15  Medications include: ASA 81 mg, bumetanide, diltiazem, enalapril, lovastatin, metformin, methotrexate, Zantac  BP 130/71   Pulse 92   Temp 36.9 C   Resp 18   Ht 4\' 8"  (1.422 m)   Wt 144 lb 11.2 oz (65.6 kg)   SpO2 99%   BMI 32.44 kg/m   Preoperative labs reviewed.   - Glucose 97. HbA1c pending.   EKG 01/28/18: NSR. Possible LA enlargement. Septal infarct, age undetermined  12/19/12 ETT (appears to have been ordered by her PCP): ETT Interpretation: normal - no evidence of ischemia by ST analysis. Comments: Poor exercise tolerance. She did note post-exercise chest pain. Normal BP response to exercise. No significant ST-T changes to suggest ischemia.  Recommendations: With patient who has diabetes (risk equivalent) would consider pharmacologic nuclear study if symptoms continue [facility]) for greater sensitivity/specificity.   Reportedly she had a normal cath in 1998 (report not currently available).  She had a limited echo done in 2004 during an admission for bacteremia. No obvious vegetation seen, overall LV function appeared to be in the normal range but study was inadequate for evaluation of LV regional wall motion.   Pt denied acute CV symptoms at pre-admission testing.   If no changes, I anticipate pt can proceed with surgery as scheduled.   Willeen Cass, FNP-BC [facility]/Anesthesiology Phone: [phone] 01/28/2018 3:40 PM

## 2018-01-29 LAB — HEMOGLOBIN A1C
Hgb A1c MFr Bld: 5.8 % — ABNORMAL HIGH (ref 4.8–5.6)
Mean Plasma Glucose: 120 mg/dL

## 2018-01-31 ENCOUNTER — Ambulatory Visit (HOSPITAL_COMMUNITY): Payer: Medicare HMO | Admitting: Emergency Medicine

## 2018-01-31 ENCOUNTER — Encounter (HOSPITAL_COMMUNITY)
Admission: AD | Disposition: A | Discharge status: Home / Self Care | Payer: Self-pay | Source: Ambulatory Visit | Attending: Neurosurgery

## 2018-01-31 ENCOUNTER — Ambulatory Visit (HOSPITAL_COMMUNITY): Payer: Medicare HMO | Admitting: Anesthesiology

## 2018-01-31 ENCOUNTER — Observation Stay (HOSPITAL_COMMUNITY)
Admission: AD | Admit: 2018-01-31 | Discharge: 2018-02-02 | Disposition: A | Discharge status: Home / Self Care | Payer: Medicare HMO | Source: Ambulatory Visit | Attending: Neurosurgery | Admitting: Neurosurgery

## 2018-01-31 ENCOUNTER — Other Ambulatory Visit: Payer: Self-pay

## 2018-01-31 ENCOUNTER — Ambulatory Visit (HOSPITAL_COMMUNITY): Payer: Medicare HMO

## 2018-01-31 ENCOUNTER — Encounter (HOSPITAL_COMMUNITY): Payer: Self-pay | Admitting: Anesthesiology

## 2018-01-31 DIAGNOSIS — Z79899 Other long term (current) drug therapy: Secondary | ICD-10-CM | POA: Diagnosis not present

## 2018-01-31 DIAGNOSIS — E119 Type 2 diabetes mellitus without complications: Secondary | ICD-10-CM | POA: Insufficient documentation

## 2018-01-31 DIAGNOSIS — Z87891 Personal history of nicotine dependence: Secondary | ICD-10-CM | POA: Diagnosis not present

## 2018-01-31 DIAGNOSIS — M48062 Spinal stenosis, lumbar region with neurogenic claudication: Principal | ICD-10-CM | POA: Diagnosis present

## 2018-01-31 DIAGNOSIS — I10 Essential (primary) hypertension: Secondary | ICD-10-CM | POA: Insufficient documentation

## 2018-01-31 DIAGNOSIS — M069 Rheumatoid arthritis, unspecified: Secondary | ICD-10-CM | POA: Diagnosis not present

## 2018-01-31 DIAGNOSIS — Z7984 Long term (current) use of oral hypoglycemic drugs: Secondary | ICD-10-CM | POA: Insufficient documentation

## 2018-01-31 DIAGNOSIS — K219 Gastro-esophageal reflux disease without esophagitis: Secondary | ICD-10-CM | POA: Insufficient documentation

## 2018-01-31 DIAGNOSIS — Z981 Arthrodesis status: Secondary | ICD-10-CM | POA: Diagnosis not present

## 2018-01-31 DIAGNOSIS — Z419 Encounter for procedure for purposes other than remedying health state, unspecified: Secondary | ICD-10-CM

## 2018-01-31 DIAGNOSIS — M5416 Radiculopathy, lumbar region: Secondary | ICD-10-CM | POA: Diagnosis not present

## 2018-01-31 HISTORY — PX: LUMBAR LAMINECTOMY/DECOMPRESSION MICRODISCECTOMY: SHX5026

## 2018-01-31 LAB — GLUCOSE, CAPILLARY
GLUCOSE-CAPILLARY: 83 mg/dL (ref 65–99)
Glucose-Capillary: 82 mg/dL (ref 65–99)
Glucose-Capillary: 92 mg/dL (ref 65–99)
Glucose-Capillary: 107 mg/dL — ABNORMAL HIGH (ref 65–99)

## 2018-01-31 SURGERY — LUMBAR LAMINECTOMY/DECOMPRESSION MICRODISCECTOMY 3 LEVELS
Anesthesia: General | Site: Back

## 2018-01-31 MED ORDER — PHENOL 1.4 % MT LIQD: 1.0000 | OROMUCOSAL | Status: DC | PRN

## 2018-01-31 MED ORDER — ONDANSETRON HCL 4 MG PO TABS: 4.0000 mg | ORAL_TABLET | Freq: Four times a day (QID) | ORAL | Status: DC | PRN

## 2018-01-31 MED ORDER — PRAVASTATIN SODIUM 40 MG PO TABS
40.0000 mg | ORAL_TABLET | Freq: Every day | ORAL | Status: DC
  Administered 2018-01-31 – 2018-02-01 (×2): 40 mg via ORAL
  Filled 2018-01-31 (×2): qty 1

## 2018-01-31 MED ORDER — SODIUM CHLORIDE 0.9% FLUSH: 3.0000 mL | Freq: Two times a day (BID) | INTRAVENOUS | Status: DC

## 2018-01-31 MED ORDER — BUPIVACAINE LIPOSOME 1.3 % IJ SUSP
20.0000 mL | Freq: Once | INTRAMUSCULAR | Status: DC
  Filled 2018-01-31: qty 20

## 2018-01-31 MED ORDER — SODIUM CHLORIDE 0.9 % IR SOLN
Status: DC | PRN
  Administered 2018-01-31: 12:00:00

## 2018-01-31 MED ORDER — METFORMIN HCL ER 500 MG PO TB24
1000.0000 mg | ORAL_TABLET | Freq: Every day | ORAL | Status: DC
  Administered 2018-01-31 – 2018-02-01 (×2): 1000 mg via ORAL
  Filled 2018-01-31 (×2): qty 2

## 2018-01-31 MED ORDER — DILTIAZEM HCL ER COATED BEADS 120 MG PO CP24
120.0000 mg | ORAL_CAPSULE | Freq: Every day | ORAL | Status: DC
Start: 2018-02-01 — End: 2018-02-02
  Administered 2018-02-01 – 2018-02-02 (×2): 120 mg via ORAL
  Filled 2018-01-31 (×2): qty 1

## 2018-01-31 MED ORDER — PROPOFOL 10 MG/ML IV BOLUS
INTRAVENOUS | Status: AC
  Filled 2018-01-31: qty 20

## 2018-01-31 MED ORDER — DOCUSATE SODIUM 100 MG PO CAPS
100.0000 mg | ORAL_CAPSULE | Freq: Two times a day (BID) | ORAL | Status: DC
  Administered 2018-01-31 – 2018-02-02 (×4): 100 mg via ORAL
  Filled 2018-01-31 (×4): qty 1

## 2018-01-31 MED ORDER — PHENYLEPHRINE 40 MCG/ML (10ML) SYRINGE FOR IV PUSH (FOR BLOOD PRESSURE SUPPORT)
PREFILLED_SYRINGE | INTRAVENOUS | Status: AC
  Filled 2018-01-31: qty 10

## 2018-01-31 MED ORDER — BUPIVACAINE-EPINEPHRINE (PF) 0.5% -1:200000 IJ SOLN
INTRAMUSCULAR | Status: DC | PRN
  Administered 2018-01-31: 10 mL

## 2018-01-31 MED ORDER — METFORMIN HCL ER 500 MG PO TB24: 750.0000 mg | ORAL_TABLET | Freq: Every day | ORAL | Status: DC

## 2018-01-31 MED ORDER — BUMETANIDE 0.5 MG PO TABS
0.5000 mg | ORAL_TABLET | Freq: Two times a day (BID) | ORAL | Status: DC
Start: 2018-01-31 — End: 2018-02-02
  Administered 2018-02-01 – 2018-02-02 (×2): 0.5 mg via ORAL
  Filled 2018-01-31 (×4): qty 1

## 2018-01-31 MED ORDER — MIDAZOLAM HCL 5 MG/5ML IJ SOLN
INTRAMUSCULAR | Status: DC | PRN
  Administered 2018-01-31: 2 mg via INTRAVENOUS

## 2018-01-31 MED ORDER — SUGAMMADEX SODIUM 200 MG/2ML IV SOLN
INTRAVENOUS | Status: AC
  Filled 2018-01-31: qty 2

## 2018-01-31 MED ORDER — HYDROCODONE-ACETAMINOPHEN 5-325 MG PO TABS
1.0000 | ORAL_TABLET | ORAL | Status: DC | PRN
  Administered 2018-01-31: 1 via ORAL
  Administered 2018-02-01 – 2018-02-02 (×2): 2 via ORAL
  Filled 2018-01-31: qty 1
  Filled 2018-01-31 (×2): qty 2

## 2018-01-31 MED ORDER — DEXTROSE 5 % IV SOLN
INTRAVENOUS | Status: DC | PRN
  Administered 2018-01-31: 10 ug/min via INTRAVENOUS

## 2018-01-31 MED ORDER — GABAPENTIN 300 MG PO CAPS
300.0000 mg | ORAL_CAPSULE | Freq: Two times a day (BID) | ORAL | Status: DC
  Administered 2018-01-31 – 2018-02-02 (×5): 300 mg via ORAL
  Filled 2018-01-31 (×5): qty 1

## 2018-01-31 MED ORDER — ONDANSETRON HCL 4 MG/2ML IJ SOLN: 4.0000 mg | Freq: Four times a day (QID) | INTRAMUSCULAR | Status: DC | PRN

## 2018-01-31 MED ORDER — FOLIC ACID 1 MG PO TABS
1.0000 mg | ORAL_TABLET | Freq: Every day | ORAL | Status: DC
  Administered 2018-02-01 – 2018-02-02 (×2): 1 mg via ORAL
  Filled 2018-01-31: qty 1
  Filled 2018-01-31: qty 2
  Filled 2018-01-31: qty 1
  Filled 2018-01-31: qty 2

## 2018-01-31 MED ORDER — THROMBIN (RECOMBINANT) 5000 UNITS EX SOLR
CUTANEOUS | Status: AC
  Filled 2018-01-31: qty 5000

## 2018-01-31 MED ORDER — THROMBIN (RECOMBINANT) 20000 UNITS EX SOLR
CUTANEOUS | Status: AC
  Filled 2018-01-31: qty 20000

## 2018-01-31 MED ORDER — BACITRACIN ZINC 500 UNIT/GM EX OINT
TOPICAL_OINTMENT | CUTANEOUS | Status: AC
  Filled 2018-01-31: qty 28.35

## 2018-01-31 MED ORDER — ZOLPIDEM TARTRATE 5 MG PO TABS: 5.0000 mg | ORAL_TABLET | Freq: Every evening | ORAL | Status: DC | PRN

## 2018-01-31 MED ORDER — LORATADINE 10 MG PO TABS
10.0000 mg | ORAL_TABLET | Freq: Every day | ORAL | Status: DC
  Administered 2018-02-01 – 2018-02-02 (×2): 10 mg via ORAL
  Filled 2018-01-31 (×2): qty 1

## 2018-01-31 MED ORDER — FAMOTIDINE 20 MG PO TABS
20.0000 mg | ORAL_TABLET | Freq: Two times a day (BID) | ORAL | Status: DC
  Administered 2018-01-31 – 2018-02-02 (×5): 20 mg via ORAL
  Filled 2018-01-31 (×5): qty 1

## 2018-01-31 MED ORDER — MAGNESIUM OXIDE 400 (241.3 MG) MG PO TABS
200.0000 mg | ORAL_TABLET | ORAL | Status: DC
  Administered 2018-01-31 – 2018-02-02 (×2): 200 mg via ORAL
  Filled 2018-01-31 (×2): qty 1

## 2018-01-31 MED ORDER — ACETAMINOPHEN 500 MG PO TABS
1000.0000 mg | ORAL_TABLET | Freq: Four times a day (QID) | ORAL | Status: AC
  Administered 2018-01-31 – 2018-02-01 (×4): 1000 mg via ORAL
  Filled 2018-01-31 (×4): qty 2

## 2018-01-31 MED ORDER — BUPIVACAINE LIPOSOME 1.3 % IJ SUSP
INTRAMUSCULAR | Status: DC | PRN
  Administered 2018-01-31: 20 mL

## 2018-01-31 MED ORDER — MENTHOL 3 MG MT LOZG: 1.0000 | LOZENGE | OROMUCOSAL | Status: DC | PRN

## 2018-01-31 MED ORDER — INSULIN ASPART 100 UNIT/ML ~~LOC~~ SOLN: 0.0000 [IU] | Freq: Three times a day (TID) | SUBCUTANEOUS | Status: DC

## 2018-01-31 MED ORDER — THROMBIN (RECOMBINANT) 5000 UNITS EX SOLR
OROMUCOSAL | Status: DC | PRN
  Administered 2018-01-31: 12:00:00 via TOPICAL

## 2018-01-31 MED ORDER — PROPOFOL 10 MG/ML IV BOLUS
INTRAVENOUS | Status: DC | PRN
  Administered 2018-01-31: 90 mg via INTRAVENOUS

## 2018-01-31 MED ORDER — SALINE SPRAY 0.65 % NA SOLN
1.0000 | Freq: Four times a day (QID) | NASAL | Status: DC | PRN
  Filled 2018-01-31: qty 44

## 2018-01-31 MED ORDER — BUPIVACAINE-EPINEPHRINE (PF) 0.5% -1:200000 IJ SOLN
INTRAMUSCULAR | Status: AC
  Filled 2018-01-31: qty 30

## 2018-01-31 MED ORDER — BISACODYL 10 MG RE SUPP
10.0000 mg | Freq: Every day | RECTAL | Status: DC | PRN
Start: 2018-01-31 — End: 2018-02-02

## 2018-01-31 MED ORDER — ROCURONIUM BROMIDE 10 MG/ML (PF) SYRINGE
PREFILLED_SYRINGE | INTRAVENOUS | Status: AC
  Filled 2018-01-31: qty 5

## 2018-01-31 MED ORDER — THROMBIN (RECOMBINANT) 5000 UNITS EX SOLR: CUTANEOUS | Status: DC | PRN

## 2018-01-31 MED ORDER — MIDAZOLAM HCL 2 MG/2ML IJ SOLN
INTRAMUSCULAR | Status: AC
  Filled 2018-01-31: qty 2

## 2018-01-31 MED ORDER — LIDOCAINE HCL (CARDIAC) 20 MG/ML IV SOLN
INTRAVENOUS | Status: DC | PRN
  Administered 2018-01-31: 60 mg via INTRAVENOUS

## 2018-01-31 MED ORDER — HYDROMORPHONE HCL 1 MG/ML IJ SOLN
0.2500 mg | INTRAMUSCULAR | Status: DC | PRN
Start: 2018-01-31 — End: 2018-01-31
  Administered 2018-01-31 (×2): 0.25 mg via INTRAVENOUS

## 2018-01-31 MED ORDER — HYDROMORPHONE HCL 1 MG/ML IJ SOLN
INTRAMUSCULAR | Status: AC
  Administered 2018-01-31: 0.25 mg via INTRAVENOUS
  Filled 2018-01-31: qty 1

## 2018-01-31 MED ORDER — SODIUM CHLORIDE 0.9% FLUSH: 3.0000 mL | INTRAVENOUS | Status: DC | PRN

## 2018-01-31 MED ORDER — PHENYLEPHRINE 40 MCG/ML (10ML) SYRINGE FOR IV PUSH (FOR BLOOD PRESSURE SUPPORT)
PREFILLED_SYRINGE | INTRAVENOUS | Status: DC | PRN
  Administered 2018-01-31: 120 ug via INTRAVENOUS
  Administered 2018-01-31: 80 ug via INTRAVENOUS

## 2018-01-31 MED ORDER — OXYCODONE HCL 5 MG PO TABS
10.0000 mg | ORAL_TABLET | ORAL | Status: DC | PRN
  Administered 2018-02-01 – 2018-02-02 (×6): 10 mg via ORAL
  Filled 2018-01-31 (×6): qty 2

## 2018-01-31 MED ORDER — ONDANSETRON HCL 4 MG/2ML IJ SOLN
INTRAMUSCULAR | Status: AC
  Filled 2018-01-31: qty 2

## 2018-01-31 MED ORDER — CEFAZOLIN SODIUM 1 G IJ SOLR
INTRAMUSCULAR | Status: AC
  Filled 2018-01-31: qty 20

## 2018-01-31 MED ORDER — LIDOCAINE HCL (CARDIAC) 20 MG/ML IV SOLN
INTRAVENOUS | Status: AC
  Filled 2018-01-31: qty 5

## 2018-01-31 MED ORDER — ACETAMINOPHEN 325 MG PO TABS
650.0000 mg | ORAL_TABLET | ORAL | Status: DC | PRN
  Administered 2018-02-02: 650 mg via ORAL
  Filled 2018-01-31: qty 2

## 2018-01-31 MED ORDER — VITAMIN D 1000 UNITS PO TABS
1000.0000 [IU] | ORAL_TABLET | Freq: Every day | ORAL | Status: DC
  Administered 2018-01-31 – 2018-02-02 (×3): 1000 [IU] via ORAL
  Filled 2018-01-31 (×3): qty 1

## 2018-01-31 MED ORDER — OXYCODONE HCL 5 MG PO TABS
5.0000 mg | ORAL_TABLET | ORAL | Status: DC | PRN
  Administered 2018-01-31: 5 mg via ORAL
  Filled 2018-01-31: qty 1

## 2018-01-31 MED ORDER — SUGAMMADEX SODIUM 200 MG/2ML IV SOLN
INTRAVENOUS | Status: DC | PRN
  Administered 2018-01-31: 200 mg via INTRAVENOUS

## 2018-01-31 MED ORDER — 0.9 % SODIUM CHLORIDE (POUR BTL) OPTIME
TOPICAL | Status: DC | PRN
  Administered 2018-01-31: 1000 mL

## 2018-01-31 MED ORDER — FENTANYL CITRATE (PF) 100 MCG/2ML IJ SOLN
INTRAMUSCULAR | Status: DC | PRN
  Administered 2018-01-31 (×5): 50 ug via INTRAVENOUS

## 2018-01-31 MED ORDER — METFORMIN HCL ER 500 MG PO TB24
500.0000 mg | ORAL_TABLET | Freq: Every day | ORAL | Status: DC
  Administered 2018-02-01 – 2018-02-02 (×2): 500 mg via ORAL
  Filled 2018-01-31 (×2): qty 1

## 2018-01-31 MED ORDER — BACITRACIN ZINC 500 UNIT/GM EX OINT
TOPICAL_OINTMENT | CUTANEOUS | Status: DC | PRN
  Administered 2018-01-31: 1 via TOPICAL

## 2018-01-31 MED ORDER — ONDANSETRON HCL 4 MG/2ML IJ SOLN
INTRAMUSCULAR | Status: DC | PRN
  Administered 2018-01-31: 4 mg via INTRAVENOUS

## 2018-01-31 MED ORDER — ACETAMINOPHEN 650 MG RE SUPP
650.0000 mg | RECTAL | Status: DC | PRN
Start: 2018-01-31 — End: 2018-02-02

## 2018-01-31 MED ORDER — ROCURONIUM BROMIDE 100 MG/10ML IV SOLN
INTRAVENOUS | Status: DC | PRN
  Administered 2018-01-31: 50 mg via INTRAVENOUS

## 2018-01-31 MED ORDER — CEFAZOLIN SODIUM-DEXTROSE 2-4 GM/100ML-% IV SOLN
2.0000 g | Freq: Three times a day (TID) | INTRAVENOUS | Status: AC
  Administered 2018-01-31 – 2018-02-01 (×2): 2 g via INTRAVENOUS
  Filled 2018-01-31 (×2): qty 100

## 2018-01-31 MED ORDER — CYCLOBENZAPRINE HCL 10 MG PO TABS
10.0000 mg | ORAL_TABLET | Freq: Three times a day (TID) | ORAL | Status: DC | PRN
  Administered 2018-01-31 – 2018-02-02 (×2): 10 mg via ORAL
  Filled 2018-01-31 (×2): qty 1

## 2018-01-31 MED ORDER — MORPHINE SULFATE (PF) 4 MG/ML IV SOLN: 4.0000 mg | INTRAVENOUS | Status: DC | PRN

## 2018-01-31 MED ORDER — LACTATED RINGERS IV SOLN
INTRAVENOUS | Status: DC
  Administered 2018-01-31: 09:00:00 via INTRAVENOUS

## 2018-01-31 MED ORDER — FENTANYL CITRATE (PF) 250 MCG/5ML IJ SOLN
INTRAMUSCULAR | Status: AC
  Filled 2018-01-31: qty 5

## 2018-01-31 MED ORDER — SODIUM CHLORIDE 0.9 % IJ SOLN
INTRAMUSCULAR | Status: AC
  Filled 2018-01-31: qty 10

## 2018-01-31 MED ORDER — ENALAPRIL MALEATE 20 MG PO TABS
20.0000 mg | ORAL_TABLET | Freq: Every day | ORAL | Status: DC
  Administered 2018-01-31 – 2018-02-01 (×2): 20 mg via ORAL
  Filled 2018-01-31 (×2): qty 1

## 2018-01-31 MED ORDER — INSULIN ASPART 100 UNIT/ML ~~LOC~~ SOLN: 0.0000 [IU] | SUBCUTANEOUS | Status: DC

## 2018-01-31 MED ORDER — CEFAZOLIN SODIUM-DEXTROSE 2-3 GM-%(50ML) IV SOLR
2.0000 g | Freq: Once | INTRAVENOUS | Status: AC
  Administered 2018-01-31: 2 g via INTRAVENOUS

## 2018-01-31 SURGICAL SUPPLY — 58 items
APL SKNCLS STERI-STRIP NONHPOA (GAUZE/BANDAGES/DRESSINGS) ×1
BAG DECANTER FOR FLEXI CONT (MISCELLANEOUS) ×2 IMPLANT
BENZOIN TINCTURE PRP APPL 2/3 (GAUZE/BANDAGES/DRESSINGS) ×2 IMPLANT
BLADE CLIPPER SURG (BLADE) IMPLANT
BUR MATCHSTICK NEURO 3.0 LAGG (BURR) ×2 IMPLANT
BUR PRECISION FLUTE 6.0 (BURR) ×2 IMPLANT
CANISTER SUCT 3000ML PPV (MISCELLANEOUS) ×2 IMPLANT
CARTRIDGE OIL MAESTRO DRILL (MISCELLANEOUS) ×1 IMPLANT
DIFFUSER DRILL AIR PNEUMATIC (MISCELLANEOUS) ×2 IMPLANT
DRAPE LAPAROTOMY 100X72X124 (DRAPES) ×2 IMPLANT
DRAPE MICROSCOPE LEICA (MISCELLANEOUS) ×2 IMPLANT
DRAPE POUCH INSTRU U-SHP 10X18 (DRAPES) IMPLANT
DRAPE SURG 17X23 STRL (DRAPES) ×8 IMPLANT
DRSG OPSITE 4X5.5 SM (GAUZE/BANDAGES/DRESSINGS) ×2 IMPLANT
DRSG OPSITE POSTOP 4X6 (GAUZE/BANDAGES/DRESSINGS) ×2 IMPLANT
ELECT BLADE 4.0 EZ CLEAN MEGAD (MISCELLANEOUS) ×2
ELECT REM PT RETURN 9FT ADLT (ELECTROSURGICAL) ×2
ELECTRODE BLDE 4.0 EZ CLN MEGD (MISCELLANEOUS) ×1 IMPLANT
ELECTRODE REM PT RTRN 9FT ADLT (ELECTROSURGICAL) ×1 IMPLANT
EVACUATOR 1/8 PVC DRAIN (DRAIN) ×2 IMPLANT
GAUZE SPONGE 4X4 12PLY STRL (GAUZE/BANDAGES/DRESSINGS) ×2 IMPLANT
GAUZE SPONGE 4X4 16PLY XRAY LF (GAUZE/BANDAGES/DRESSINGS) ×2 IMPLANT
GLOVE BIO SURGEON STRL SZ8 (GLOVE) ×2 IMPLANT
GLOVE BIO SURGEON STRL SZ8.5 (GLOVE) ×2 IMPLANT
GLOVE BIOGEL PI IND STRL 7.5 (GLOVE) ×1 IMPLANT
GLOVE BIOGEL PI IND STRL 8 (GLOVE) ×4 IMPLANT
GLOVE BIOGEL PI INDICATOR 7.5 (GLOVE) ×1
GLOVE BIOGEL PI INDICATOR 8 (GLOVE) ×4
GLOVE ECLIPSE 7.5 STRL STRAW (GLOVE) ×6 IMPLANT
GLOVE ECLIPSE 9.0 STRL (GLOVE) ×2 IMPLANT
GLOVE EXAM NITRILE LRG STRL (GLOVE) IMPLANT
GLOVE EXAM NITRILE XL STR (GLOVE) IMPLANT
GLOVE EXAM NITRILE XS STR PU (GLOVE) IMPLANT
GOWN STRL REUS W/ TWL LRG LVL3 (GOWN DISPOSABLE) IMPLANT
GOWN STRL REUS W/ TWL XL LVL3 (GOWN DISPOSABLE) ×2 IMPLANT
GOWN STRL REUS W/TWL 2XL LVL3 (GOWN DISPOSABLE) ×4 IMPLANT
GOWN STRL REUS W/TWL LRG LVL3 (GOWN DISPOSABLE)
GOWN STRL REUS W/TWL XL LVL3 (GOWN DISPOSABLE) ×4
HEMOSTAT POWDER KIT SURGIFOAM (HEMOSTASIS) ×2 IMPLANT
KIT BASIN OR (CUSTOM PROCEDURE TRAY) ×2 IMPLANT
KIT ROOM TURNOVER OR (KITS) ×2 IMPLANT
NEEDLE HYPO 21X1.5 SAFETY (NEEDLE) ×2 IMPLANT
NEEDLE HYPO 22GX1.5 SAFETY (NEEDLE) ×2 IMPLANT
NS IRRIG 1000ML POUR BTL (IV SOLUTION) ×2 IMPLANT
OIL CARTRIDGE MAESTRO DRILL (MISCELLANEOUS) ×2
PACK LAMINECTOMY NEURO (CUSTOM PROCEDURE TRAY) ×2 IMPLANT
PAD ARMBOARD 7.5X6 YLW CONV (MISCELLANEOUS) ×8 IMPLANT
PATTIES SURGICAL .5 X1 (DISPOSABLE) IMPLANT
RUBBERBAND STERILE (MISCELLANEOUS) ×4 IMPLANT
SPONGE SURGIFOAM ABS GEL SZ50 (HEMOSTASIS) IMPLANT
STRIP CLOSURE SKIN 1/2X4 (GAUZE/BANDAGES/DRESSINGS) ×2 IMPLANT
SUT VIC AB 1 CT1 18XBRD ANBCTR (SUTURE) ×2 IMPLANT
SUT VIC AB 1 CT1 8-18 (SUTURE) ×4
SUT VIC AB 2-0 CP2 18 (SUTURE) ×4 IMPLANT
SYRINGE 20CC LL (MISCELLANEOUS) ×2 IMPLANT
TOWEL GREEN STERILE (TOWEL DISPOSABLE) ×2 IMPLANT
TOWEL GREEN STERILE FF (TOWEL DISPOSABLE) ×2 IMPLANT
WATER STERILE IRR 1000ML POUR (IV SOLUTION) ×2 IMPLANT

## 2018-01-31 NOTE — Progress Notes (Signed)
Subjective: The patient is somnolent but arousable.  She is in no apparent distress.  Objective: Vital signs in last 24 hours: Temp:  [97.5 F (36.4 C)-98 F (36.7 C)] (P) 97.5 F (36.4 C) (03/11 1358) Pulse Rate:  [80] 80 (03/11 0828) Resp:  [20] 20 (03/11 0828) BP: (167)/(64) 167/64 (03/11 0828) SpO2:  [100 %] 100 % (03/11 0828) Estimated body mass index is 32.44 kg/m as calculated from the following:   Height as of 01/28/18: 4\' 8"  (1.422 m).   Weight as of 01/28/18: 65.6 kg (144 lb 11.2 oz).   Intake/Output from previous day: No intake/output data recorded. Intake/Output this shift: Total I/O In: 900 [I.V.:900] Out: 150 [Blood:150]  Physical exam the patient is somnolent but arousable.  She moves her lower extremities well.  Lab Results: No results for input(s): WBC, HGB, HCT, PLT in the last 72 hours. BMET No results for input(s): NA, K, CL, CO2, GLUCOSE, BUN, CREATININE, CALCIUM in the last 72 hours.  Studies/Results: Dg Lumbar Spine 1 View  Result Date: 01/31/2018 CLINICAL DATA:  Localization image for L2-5 LAMINECTOMY AND FORAMINOTOMY for SPINAL STENOSIS, LUMBAR REGION WITH NEUROGENIC CLAUDICATION. EXAM: LUMBAR SPINE - 1 VIEW COMPARISON:  CT lumbar myelogram dated 01/05/2018. FINDINGS: Based on the CT myelogram, there are 4 lumbar type vertebral bodies. Based on this numbering, the surgical probe appreciated on the intraoperative cross-table lateral view is positioned at the posterior elements at the level of the L1-2 disc space. IMPRESSION: Based on the numbering provided on CT myelogram of 01/05/2018, surgical probe is overlying the posterior elements at the L1-2 level. Electronically Signed   By: Franki Cabot M.D.   On: 01/31/2018 13:44    Assessment/Plan: The patient is doing well.  LOS: 0 days     [name] 01/31/2018, 2:04 PM

## 2018-01-31 NOTE — Op Note (Signed)
Brief history: The patient is a 72 year old black female who has complained of back and right greater than left back, buttock and leg pain consistent with neurogenic claudication.  She has failed medical management and was worked up with a lumbar myelo CT and lumbar x-rays.  These demonstrated the patient had altered level lumbar spinal stenosis.  I discussed the situation with the patient.  We discussed the various treatment options.  She decided proceed with a laminectomy after weighing the risks, benefits and alternatives to surgery.  Preoperative diagnosis: Lumbar spinal stenosis with neurogenic claudication, lumbago, lumbar radiculopathy  Postoperative diagnosis: The same  Procedure: L1-2, L2-3, L3-4 and L4-5 laminectomy/laminotomy/foraminotomies using microdissection  Surgeon: Dr. Earle Gell  Asst.: Dr. Granville Lewis  Anesthesia: Gen. endotracheal  Estimated blood loss: 150 cc  Drains: One medium Hemovac in the epidural space  Complications: None  Description of procedure: The patient was brought to the operating room by the anesthesia team. General endotracheal anesthesia was induced. The patient was turned to the prone position on the Laughner frame. The patient's lumbosacral region was then prepared with Betadine scrub and Betadine solution. Sterile drapes were applied.  I then injected the area to be incised with Marcaine with epinephrine solution. I then used a scalpel to make a linear midline incision over the L1-2, L2-3, L3-4 and L4-5 intervertebral disc space. I then used electrocautery to perform a bilateral subperiosteal dissection exposing the spinous process and lamina of L1, L2, L3 and L4. We obtained intraoperative radiograph to confirm our location. I then inserted the [facility] retractor for exposure.  We began the decompression by incising the interspinous ligament at L1-2, L2-3, L3-4 and L4-5.  I used the rongeurs to remove the spinous process of L2, L3 and L4 and the  caudal aspect of the L1 spinous process.  We then brought the operative microscope into the field. Under its magnification and illumination we completed the microdissection. I used a high-speed drill to perform bilateral laminotomies at L1-2, L2-3, L3-4 and L4-5. I then used a Kerrison punches to complete the laminectomy at L2-3, L3-4 and L4-5 and to widen the.  The removed the ligamentum flavum at L1-2, L2-3, L3-4 and L4-5 using the Kerrison punches. We then used microdissection to free up the thecal sac and the bilateral L2, L3, L4 and L5 nerve root from the epidural tissue. I then used a Kerrison punch to perform a foraminotomy at about the bilateral L2, L3, L4 and L5 nerve root.  We inspected the intervertebral disc bilaterally at L1-2, L2-3, L3-4 and L4-5.  There were no herniations.  I then palpated along the ventral surface of the thecal sac and along exit route of the bilateral L2, L3, L4 and L5 nerve root and noted that the neural structures were well decompressed. This completed the decompression.  We then obtained hemostasis using bipolar electrocautery. We irrigated the wound out with bacitracin solution. We then removed the retractor.  We placed a medium Hemovac drain in the epidural space and tunneled it out through separate stab wound.  We injected the tissues with Exparel.  We then reapproximated the patient's thoracolumbar fascia with interrupted #1 Vicryl suture. We then reapproximated the patient's subcutaneous tissue with interrupted 2-0 Vicryl suture. We then reapproximated patient's skin with Steri-Strips and benzoin. The was then coated with bacitracin ointment. The drapes were removed. The patient was subsequently returned to the supine position where they were extubated by the anesthesia team. The patient was then transported to the postanesthesia care  unit in stable condition. All sponge instrument and needle counts were reportedly correct at the end of this case.

## 2018-01-31 NOTE — Anesthesia Procedure Notes (Signed)
Procedure Name: Intubation Date/Time: 01/31/2018 10:59 AM Performed by: Kyung Rudd, CRNA Pre-anesthesia Checklist: Patient identified, Emergency Drugs available, Suction available and Patient being monitored Patient Re-evaluated:Patient Re-evaluated prior to induction Oxygen Delivery Method: Circle system utilized Preoxygenation: Pre-oxygenation with 100% oxygen Induction Type: IV induction Ventilation: Mask ventilation without difficulty Laryngoscope Size: Mac and 4 Grade View: Grade I Tube type: Oral Tube size: 7.0 mm Number of attempts: 1 Airway Equipment and Method: Stylet Placement Confirmation: ETT inserted through vocal cords under direct vision,  positive ETCO2 and breath sounds checked- equal and bilateral Secured at: 20 cm Tube secured with: Tape Dental Injury: Teeth and Oropharynx as per pre-operative assessment

## 2018-01-31 NOTE — Plan of Care (Signed)
  Progressing Safety: Ability to remain free from injury will improve 01/31/2018 2047 - Progressing by Charlena Cross, RN Activity: Ability to avoid complications of mobility impairment will improve 01/31/2018 2047 - Progressing by Charlena Cross, RN Ability to tolerate increased activity will improve 01/31/2018 2047 - Progressing by Charlena Cross, RN Will remain free from falls 01/31/2018 2047 - Progressing by Margot Chimes D, RN Bowel/Gastric: Gastrointestinal status for postoperative course will improve 01/31/2018 2047 - Progressing by Charlena Cross, RN Education: Ability to verbalize activity precautions or restrictions will improve 01/31/2018 2047 - Progressing by Charlena Cross, RN Knowledge of the prescribed therapeutic regimen will improve 01/31/2018 2047 - Progressing by Charlena Cross, RN Understanding of discharge needs will improve 01/31/2018 2047 - Progressing by Charlena Cross, RN Physical Regulation: Ability to maintain clinical measurements within normal limits will improve 01/31/2018 2047 - Progressing by Charlena Cross, RN Postoperative complications will be avoided or minimized 01/31/2018 2047 - Progressing by Charlena Cross, RN Diagnostic test results will improve 01/31/2018 2047 - Progressing by Charlena Cross, RN Pain Management: Pain level will decrease 01/31/2018 2047 - Progressing by Charlena Cross, RN Skin Integrity: Signs of wound healing will improve 01/31/2018 2047 - Progressing by Margot Chimes D, RN Health Behavior/Discharge Planning: Identification of resources available to assist in meeting health care needs will improve 01/31/2018 2047 - Progressing by Charlena Cross, RN Bladder/Genitourinary: Urinary functional status for postoperative course will improve 01/31/2018 2047 - Progressing by Charlena Cross, RN

## 2018-01-31 NOTE — Transfer of Care (Signed)
Immediate Anesthesia Transfer of Care Note  Patient: [name]  Procedure(s) Performed: LAMINECTOMY AND FORAMINOTOMY LUMBAR TWO- LUMBAR THREE, LUMBAR THREE- LUMBAR FOUR, LUMBAR FOUR- LUMBAR FIVE  (N/A Back)  Patient Location: PACU  Anesthesia Type:General  Level of Consciousness: awake, alert  and oriented  Airway & Oxygen Therapy: Patient Spontanous Breathing and Patient connected to nasal cannula oxygen  Post-op Assessment: Report given to RN, Post -op Vital signs reviewed and stable and Patient moving all extremities X 4  Post vital signs: Reviewed and stable  Last Vitals:  Vitals:   01/31/18 0828  BP: (!) 167/64  Pulse: 80  Resp: 20  Temp: 36.7 C  SpO2: 100%    Last Pain:  Vitals:   01/31/18 0828  TempSrc: Oral         Complications: No apparent anesthesia complications

## 2018-01-31 NOTE — H&P (Signed)
Subjective: The patient is a 72 year old black female who has complained of back, buttock and leg pain, right greater than left consistent with neurogenic claudication.  She has failed medical management and was worked up with a lumbar MRI and lumbar x-rays.  This demonstrated multilevel spinal stenosis.  I discussed the various treatment options with the patient.  She has decided proceed with surgery.  Past Medical History:  Diagnosis Date  . Arthritis   . Asthma   . Back pain   . Diabetes mellitus    type 2  . Dyspnea    with asthma attacks   . Dysrhythmia   . Family history of adverse reaction to anesthesia    my first cousin had difficulty waking up   . Fibroid    ovarian  . GERD (gastroesophageal reflux disease)   . History of staph infection    in hospital for 11 days/ in 2007  . Hyperlipidemia   . Hypertension   . Ovarian cyst   . Radiculopathy   . Wears glasses     Past Surgical History:  Procedure Laterality Date  . ABDOMINAL HYSTERECTOMY  1998   TAH,BSO  . ANTERIOR CERVICAL DECOMP/DISCECTOMY FUSION N/A 12/11/2015   Procedure: ANTERIOR CERVICAL DECOMPRESSION/DISCECTOMY FUSION 2 LEVELS;  Surgeon: Phylliss Bob, MD;  Location: [facility];  Service: Orthopedics;  Laterality: N/A;  Anterior cervical decompression fusion, cervical 6-7, cervical 7-thoracic 1 with instrumentation and allograft  . BACK SURGERY     "NECK DISC" SURGERY  . CARDIAC CATHETERIZATION  1998  . CATARACT EXTRACTION W/ INTRAOCULAR LENS  IMPLANT, BILATERAL  2015   Bil  . COLONOSCOPY    . LAPAROSCOPIC OVARIAN CYSTECTOMY  1970  . ROTATOR CUFF REPAIR  02/2003   right shoulder  . SHOULDER SURGERY  10/2003  . thumb surgery  2010   right thumb  . TONSILLECTOMY    . TUBAL LIGATION      Allergies  Allergen Reactions  . Ivp Dye [Iodinated Diagnostic Agents] Nausea And Vomiting  . Prednisone Other (See Comments)    reflux    Social History   Tobacco Use  . Smoking status: Former Smoker    Types:  Cigarettes    Last attempt to quit: 11/23/1992    Years since quitting: 25.2  . Smokeless tobacco: Never Used  Substance Use Topics  . Alcohol use: No    Family History  Problem Relation Age of Onset  . Diabetes Sister   . Diabetes Brother   . Diabetes Brother   . Diabetes Paternal Uncle   . Cancer Paternal Aunt        UTERINE  . Hypertension Maternal Grandmother   . Heart disease Maternal Grandmother   . Colon cancer Neg Hx    Prior to Admission medications   Medication Sig Start Date End Date Taking? Authorizing Provider  aspirin EC 81 MG tablet Take 81 mg by mouth daily.   Yes [provider]  bumetanide (BUMEX) 0.5 MG tablet Take 0.5 mg by mouth 2 (two) times daily.     Yes [provider]  cetirizine (ZYRTEC) 10 MG tablet Take 10 mg by mouth daily as needed for allergies (during Spring/Summer).   Yes [provider]  cholecalciferol (VITAMIN D) 1000 units tablet Take 1,000 Units by mouth daily.   Yes [provider]  diclofenac (VOLTAREN) 75 MG EC tablet Take 75 mg by mouth 2 (two) times daily.   Yes [provider]  diltiazem (CARDIZEM CD) 120 MG 24  hr capsule Take 120 mg by mouth daily before breakfast.    Yes [provider]  enalapril (VASOTEC) 20 MG tablet Take 20 mg by mouth at bedtime.  08/05/13  Yes [provider]  folic acid (FOLVITE) 124 MCG tablet Take 800 mcg by mouth daily.   Yes [provider]  gabapentin (NEURONTIN) 300 MG capsule Take 300 mg by mouth 2 (two) times daily.    Yes [provider]  HYDROcodone-acetaminophen (NORCO/VICODIN) 5-325 MG tablet Take 1 tablet by mouth every 6 (six) hours as needed for moderate pain.   Yes [provider]  lovastatin (MEVACOR) 40 MG tablet Take 40 mg by mouth at bedtime.  04/10/14  Yes [provider]  Magnesium 300 MG CAPS Take 300 mg by mouth every other day.   Yes [provider]  metFORMIN (GLUCOPHAGE-XR) 500 MG 24  hr tablet Take 500-1,000 mg by mouth 2 (two) times daily. Take 1 tablet (500 mg) in the morning and take 2 tablets (1000 mg) with supper 11/11/17  Yes [provider]  methotrexate (RHEUMATREX) 2.5 MG tablet Take 12.5 mg by mouth every Sunday at [place]. Protect from light.    Yes [provider]  Multiple Vitamin (MULTIVITAMIN WITH MINERALS) TABS tablet Take 1 tablet by mouth daily.   Yes [provider]  ranitidine (ZANTAC) 300 MG tablet Take 300 mg by mouth 2 (two) times daily. Lunch & supper 10/16/13  Yes [provider]  sodium chloride (OCEAN) 0.65 % SOLN nasal spray Place 1-2 sprays into the nose 4 (four) times daily as needed for congestion.   Yes [provider]     Review of Systems  Positive ROS: As above  All other systems have been reviewed and were otherwise negative with the exception of those mentioned in the HPI and as above.  Objective: Vital signs in last 24 hours: Temp:  [98 F (36.7 C)] 98 F (36.7 C) (03/11 0828) Pulse Rate:  [80] 80 (03/11 0828) Resp:  [20] 20 (03/11 0828) BP: (167)/(64) 167/64 (03/11 0828) SpO2:  [100 %] 100 % (03/11 0828) Estimated body mass index is 32.44 kg/m as calculated from the following:   Height as of 01/28/18: 4\' 8"  (1.422 m).   Weight as of 01/28/18: 65.6 kg (144 lb 11.2 oz).   General Appearance: Alert Head: Normocephalic, without obvious abnormality, atraumatic Eyes: PERRL, conjunctiva/corneas clear, EOM's intact,    Ears: Normal  Throat: Normal  Neck: Supple, Back: unremarkable Lungs: Clear to auscultation bilaterally, respirations unlabored Heart: Regular rate and rhythm, no murmur, rub or gallop Abdomen: Soft, non-tender Extremities: Extremities normal, atraumatic, no cyanosis or edema Skin: unremarkable  NEUROLOGIC:   Mental status: alert and oriented,Motor Exam - grossly normal Sensory Exam - grossly normal Reflexes:  Coordination - grossly normal Gait -  grossly normal Balance - grossly normal Cranial Nerves: I: smell Not tested  II: visual acuity  OS: Normal  OD: Normal   II: visual fields Full to confrontation  II: pupils Equal, round, reactive to light  III,VII: ptosis None  III,IV,VI: extraocular muscles  Full ROM  V: mastication Normal  V: facial light touch sensation  Normal  V,VII: corneal reflex  Present  VII: facial muscle function - upper  Normal  VII: facial muscle function - lower Normal  VIII: hearing Not tested  IX: soft palate elevation  Normal  IX,X: gag reflex Present  XI: trapezius strength  5/5  XI: sternocleidomastoid strength 5/5  XI: neck flexion  strength  5/5  XII: tongue strength  Normal    Data Review Lab Results  Component Value Date   WBC 10.2 01/28/2018   HGB 11.8 (L) 01/28/2018   HCT 36.8 01/28/2018   MCV 82.7 01/28/2018   PLT 485 (H) 01/28/2018   Lab Results  Component Value Date   NA 143 01/28/2018   K 3.1 (L) 01/28/2018   CL 103 01/28/2018   CO2 28 01/28/2018   BUN 13 01/28/2018   CREATININE 0.69 01/28/2018   GLUCOSE 97 01/28/2018   Lab Results  Component Value Date   INR 1.00 12/06/2015    Assessment/Plan: L2-3, L3-4 and L4-5 spinal stenosis, lumbago, lumbar radiculopathy, neurogenic claudication: I have discussed the situation with the patient and reviewed her imaging studies with her.  We have discussed the various treatment options including surgery.  I have described the surgical treatment option of an L2-3, L3-4 and L4-5 laminectomy/laminotomy/foraminotomy.  I have shown her surgical models.  I have given her a surgical pamphlet.  We have discussed the risks, benefits, alternatives, expected postoperative course, and likelihood of achieving our goals with surgery.  I have answered all her questions.  She has decided to proceed with surgery.   Ophelia Charter 01/31/2018 10:43 AM

## 2018-01-31 NOTE — Anesthesia Preprocedure Evaluation (Addendum)
Anesthesia Evaluation  Patient identified by MRN, date of birth, ID band Patient awake    Reviewed: Allergy & Precautions, H&P , NPO status , Patient's Chart, lab work & pertinent test results  Airway Mallampati: II  TM Distance: >3 FB Neck ROM: Full    Dental no notable dental hx. (+) Teeth Intact, Dental Advisory Given   Pulmonary asthma , former smoker,    Pulmonary exam normal breath sounds clear to auscultation       Cardiovascular hypertension, On Medications  Rhythm:Regular Rate:Normal     Neuro/Psych negative neurological ROS  negative psych ROS   GI/Hepatic Neg liver ROS, GERD  Medicated and Controlled,  Endo/Other  diabetes, Type 2, Oral Hypoglycemic Agents  Renal/GU negative Renal ROS  negative genitourinary   Musculoskeletal  (+) Arthritis , Rheumatoid disorders,    Abdominal   Peds  Hematology negative hematology ROS (+)   Anesthesia Other Findings   Reproductive/Obstetrics negative OB ROS                            Anesthesia Physical Anesthesia Plan  ASA: II  Anesthesia Plan: General   Post-op Pain Management:    Induction: Intravenous  PONV Risk Score and Plan: 4 or greater and Ondansetron, Midazolam and Treatment may vary due to age or medical condition  Airway Management Planned: Oral ETT  Additional Equipment:   Intra-op Plan:   Post-operative Plan: Extubation in OR  Informed Consent: I have reviewed the patients History and Physical, chart, labs and discussed the procedure including the risks, benefits and alternatives for the proposed anesthesia with the patient or authorized representative who has indicated his/her understanding and acceptance.   Dental advisory given  Plan Discussed with: CRNA  Anesthesia Plan Comments:         Anesthesia Quick Evaluation

## 2018-02-01 ENCOUNTER — Encounter (HOSPITAL_COMMUNITY): Payer: Self-pay | Admitting: Neurosurgery

## 2018-02-01 DIAGNOSIS — Z7984 Long term (current) use of oral hypoglycemic drugs: Secondary | ICD-10-CM | POA: Diagnosis not present

## 2018-02-01 DIAGNOSIS — K219 Gastro-esophageal reflux disease without esophagitis: Secondary | ICD-10-CM | POA: Diagnosis not present

## 2018-02-01 DIAGNOSIS — E119 Type 2 diabetes mellitus without complications: Secondary | ICD-10-CM | POA: Diagnosis not present

## 2018-02-01 DIAGNOSIS — M069 Rheumatoid arthritis, unspecified: Secondary | ICD-10-CM | POA: Diagnosis not present

## 2018-02-01 DIAGNOSIS — M5416 Radiculopathy, lumbar region: Secondary | ICD-10-CM | POA: Diagnosis not present

## 2018-02-01 DIAGNOSIS — I10 Essential (primary) hypertension: Secondary | ICD-10-CM | POA: Diagnosis not present

## 2018-02-01 DIAGNOSIS — Z79899 Other long term (current) drug therapy: Secondary | ICD-10-CM | POA: Diagnosis not present

## 2018-02-01 DIAGNOSIS — M48062 Spinal stenosis, lumbar region with neurogenic claudication: Secondary | ICD-10-CM | POA: Diagnosis not present

## 2018-02-01 DIAGNOSIS — Z87891 Personal history of nicotine dependence: Secondary | ICD-10-CM | POA: Diagnosis not present

## 2018-02-01 LAB — GLUCOSE, CAPILLARY
GLUCOSE-CAPILLARY: 109 mg/dL — AB (ref 65–99)
Glucose-Capillary: 101 mg/dL — ABNORMAL HIGH (ref 65–99)
Glucose-Capillary: 104 mg/dL — ABNORMAL HIGH (ref 65–99)
Glucose-Capillary: 144 mg/dL — ABNORMAL HIGH (ref 65–99)

## 2018-02-01 LAB — CBC
HCT: 31.5 % — ABNORMAL LOW (ref 36.0–46.0)
Hemoglobin: 10.1 g/dL — ABNORMAL LOW (ref 12.0–15.0)
MCH: 26.5 pg (ref 26.0–34.0)
MCHC: 32.1 g/dL (ref 30.0–36.0)
MCV: 82.7 fL (ref 78.0–100.0)
Platelets: 414 K/uL — ABNORMAL HIGH (ref 150–400)
RBC: 3.81 MIL/uL — ABNORMAL LOW (ref 3.87–5.11)
RDW: 17.8 % — ABNORMAL HIGH (ref 11.5–15.5)
WBC: 9.2 K/uL (ref 4.0–10.5)

## 2018-02-01 LAB — BASIC METABOLIC PANEL WITH GFR
Anion gap: 10 (ref 5–15)
BUN: 10 mg/dL (ref 6–20)
CO2: 25 mmol/L (ref 22–32)
Calcium: 8.4 mg/dL — ABNORMAL LOW (ref 8.9–10.3)
Chloride: 104 mmol/L (ref 101–111)
Creatinine, Ser: 0.66 mg/dL (ref 0.44–1.00)
GFR calc Af Amer: >60 mL/min
GFR calc non Af Amer: >60 mL/min
Glucose, Bld: 103 mg/dL — ABNORMAL HIGH (ref 65–99)
Potassium: 3.3 mmol/L — ABNORMAL LOW (ref 3.5–5.1)
Sodium: 139 mmol/L (ref 135–145)

## 2018-02-01 MED ORDER — POTASSIUM CHLORIDE CRYS ER 20 MEQ PO TBCR
40.0000 meq | EXTENDED_RELEASE_TABLET | Freq: Two times a day (BID) | ORAL | Status: DC
  Administered 2018-02-01 – 2018-02-02 (×3): 40 meq via ORAL
  Filled 2018-02-01 (×3): qty 2

## 2018-02-01 NOTE — Evaluation (Signed)
Occupational Therapy Evaluation Patient Details Name: [name] MRN: [id number] DOB: July 06, 1946 Today's Date: 02/01/2018    History of Present Illness 72 yo female s/p PLIF L1-5 laminectomy PMH:  Past Medical History:  Diagnosis Date  . Arthritis   . Asthma   . Back pain   . Diabetes mellitus    type 2  . Dyspnea    with asthma attacks   . Dysrhythmia   . Family history of adverse reaction to anesthesia    my first cousin had difficulty waking up   . Fibroid    ovarian  . GERD (gastroesophageal reflux disease)   . History of staph infection    in hospital for 11 days/ in 2007  . Hyperlipidemia   . Hypertension   . Ovarian cyst   . Radiculopathy   . Wears glasses       Clinical Impression   Patient is s/p L1-5 laminectomy surgery resulting in functional limitations due to the deficits listed below (see OT problem list). Pt requires AE for LB dressing at MOd I level and plans to purchase hip kit. Pt currently demonstrates balance deficits with transfer this session and advised use of RW.  Patient will benefit from skilled OT acutely to increase independence and safety with ADLS to allow discharge [place].     Follow Up Recommendations  Home health OT    Equipment Recommendations  Other (comment)(RW)    Recommendations for Other Services PT consult     Precautions / Restrictions Precautions Precautions: Back Precaution Comments: handout provided and reviewed in detail for adls with precautions Restrictions Weight Bearing Restrictions: No      Mobility Bed Mobility Overal bed mobility: Needs Assistance Bed Mobility: Sit to Supine       Sit to supine: Min assist   General bed mobility comments: mod v/c for sequence and min (A) for bil LE onto bed surface.   Transfers Overall transfer level: Needs assistance Equipment used: (cane) Transfers: Sit to/from Stand Sit to Stand: Min guard         General transfer comment: pt able to power up on her own  but requires incr (A) once attempting to transfer. pt reaching for environment with lack of awarness to bending to reach surfaces    Balance Overall balance assessment: Needs assistance Sitting-balance support: No upper extremity supported;Feet supported Sitting balance-Leahy Scale: Good     Standing balance support: Single extremity supported;During functional activity Standing balance-Leahy Scale: Fair                             ADL either performed or assessed with clinical judgement   ADL Overall ADL's : Needs assistance/impaired Eating/Feeding: Set up   Grooming: Set up   Upper Body Bathing: Set up   Lower Body Bathing: Modified independent;Adhering to back precautions;With adaptive equipment Lower Body Bathing Details (indicate cue type and reason): pt able to complete simulation with AE       Lower Body Dressing Details (indicate cue type and reason): pt requires the use of AE to incr independence. Pt is unable to cross bil LE at this time Toilet Transfer: Copy Details (indicate cue type and reason): pt reports due to 4'9" height the handicap toilets at home can be difficult at times to get onto    [place] Details (indicate cue type and reason): educated on lateral lean and squat vs bending to wipe  Tub/Shower Transfer Details (indicate cue type and reason): advised to sponge bath at this time due to balance and that family use (A) with first 2 attempts Functional mobility during ADLs: Supervision/safety General ADL Comments: pt reaching with L UE for environment with R UE on cane. QUestion the need for RW. Will advise PT on possible need    Back handout provided and reviewed adls in detail. Pt educated on: c avoid sitting for long periods of time, correct bed positioning for sleeping, correct sequence for bed mobility, avoiding lifting more than 5 pounds and never wash directly over incision.     Vision Baseline Vision/History: Wears glasses Wears Glasses: At all times       Perception     Praxis      Pertinent Vitals/Pain Pain Assessment: 0-10 Pain Score: 3  Pain Location: back Pain Descriptors / Indicators: Operative site guarding Pain Intervention(s): Monitored during session;Premedicated before session;Repositioned     Hand Dominance Right   Extremity/Trunk Assessment Upper Extremity Assessment Upper Extremity Assessment: RUE deficits/detail RUE Deficits / Details: R TSA per chart   Lower Extremity Assessment Lower Extremity Assessment: Defer to PT evaluation   Cervical / Trunk Assessment Cervical / Trunk Assessment: Other exceptions Cervical / Trunk Exceptions: s/p surg   Communication Communication Communication: No difficulties   Cognition Arousal/Alertness: Awake/alert Behavior During Therapy: WFL for tasks assessed/performed Overall Cognitive Status: Within Functional Limits for tasks assessed                                 General Comments: pt becoming somewhat sleeping from medication givne just prior to the start of session   General Comments  dressing changed by RN on arrival    Exercises     Shoulder Instructions      Home Living Family/patient expects to be discharged to:: Private residence Living Arrangements: Spouse/significant other Available Help at Discharge: Friend(s);Available PRN/intermittently Type of Home: House Home Access: Ramped entrance;Other (comment)(3 steps no rail is the typical entrance)     Home Layout: One level     Bathroom Shower/Tub: Teacher, early years/pre: Handicapped height     Home Equipment: [place] - quad;Shower seat;Other (comment)   Additional Comments: 2 inch step stool, hand held shower head, spouse requires use of ramp      Prior Functioning/Environment Level of Independence: Independent with assistive device(s)        Comments: daughter and granddaughter (70)  live in the house. 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 Pt sews for a living. Spouse arrived in wheelchair at the end of session        OT Problem List: Decreased activity tolerance;Decreased knowledge of precautions;Decreased knowledge of use of DME or AE;Decreased safety awareness;Decreased strength;Impaired balance (sitting and/or standing);Pain      OT Treatment/Interventions: Self-care/ADL training;Therapeutic activities;Therapeutic exercise;DME and/or AE instruction;Patient/family education;Balance training    OT Goals(Current goals can be found in the care plan section) Acute Rehab OT Goals Patient Stated Goal: to get back to doing for myself and heal properly OT Goal Formulation: With patient Time For Goal Achievement: 03/18/18 Potential to Achieve Goals: Good  OT Frequency: Min 3X/week   Barriers to D/C:            Co-evaluation              AM-PAC PT "6 Clicks" Daily  Activity     Outcome Measure Help from another person eating meals?: None Help from another person taking care of personal grooming?: A Little Help from another person toileting, which includes using toliet, bedpan, or urinal?: A Little Help from another person bathing (including washing, rinsing, drying)?: A Lot Help from another person to put on and taking off regular upper body clothing?: A Little Help from another person to put on and taking off regular lower body clothing?: A Lot 6 Click Score: 17   End of Session Equipment Utilized During Treatment: Gait belt Nurse Communication: Mobility status;Precautions  Activity Tolerance: Patient tolerated treatment well Patient left: in bed;with call bell/phone within reach;with family/visitor present  OT Visit Diagnosis: Unsteadiness on feet (R26.81);Muscle weakness (generalized) (M62.81)                Time: 1962-2297 OT Time Calculation (min): 35 min Charges:  OT General Charges $OT  Visit: 1 Visit OT Evaluation $OT Eval Moderate Complexity: 1 Mod OT Treatments $Self Care/Home Management : 8-22 mins G-Codes:      Jeri Modena   OTR/L Pager: [phone] Office: [phone] .   Parke Poisson B 02/01/2018, 10:37 AM

## 2018-02-01 NOTE — Progress Notes (Signed)
Subjective: The patient is alert and pleasant.  She looks well.  Objective: Vital signs in last 24 hours: Temp:  [97.4 F (36.3 C)-99.2 F (37.3 C)] 98.3 F (36.8 C) (03/12 0741) Pulse Rate:  [71-102] 94 (03/12 0741) Resp:  [10-23] 18 (03/12 0741) BP: (112-167)/(50-91) 112/62 (03/12 0741) SpO2:  [89 %-100 %] 100 % (03/12 0741) Weight:  [65.3 kg (144 lb)] 65.3 kg (144 lb) (03/11 1546) Estimated body mass index is 32.28 kg/m as calculated from the following:   Height as of this encounter: 4\' 8"  (1.422 m).   Weight as of this encounter: 65.3 kg (144 lb).   Intake/Output from previous day: 03/11 0701 - 03/12 0700 In: 1240 [P.O.:240; I.V.:1000] Out: 180 [Drains:30; Blood:150] Intake/Output this shift: No intake/output data recorded.  Physical exam patient is alert and oriented.  She is moving her lower extremities well.  Lab Results: Recent Labs    02/01/18 0443  WBC 9.2  HGB 10.1*  HCT 31.5*  PLT 414*   BMET Recent Labs    02/01/18 0443  NA 139  K 3.3*  CL 104  CO2 25  GLUCOSE 103*  BUN 10  CREATININE 0.66  CALCIUM 8.4*    Studies/Results: Dg Lumbar Spine 1 View  Result Date: 01/31/2018 CLINICAL DATA:  Localization image for L2-5 LAMINECTOMY AND FORAMINOTOMY for SPINAL STENOSIS, LUMBAR REGION WITH NEUROGENIC CLAUDICATION. EXAM: LUMBAR SPINE - 1 VIEW COMPARISON:  CT lumbar myelogram dated 01/05/2018. FINDINGS: Based on the CT myelogram, there are 4 lumbar type vertebral bodies. Based on this numbering, the surgical probe appreciated on the intraoperative cross-table lateral view is positioned at the posterior elements at the level of the L1-2 disc space. IMPRESSION: Based on the numbering provided on CT myelogram of 01/05/2018, surgical probe is overlying the posterior elements at the L1-2 level. Electronically Signed   By: Franki Cabot M.D.   On: 01/31/2018 13:44    Assessment/Plan: Postop day 1: The patient is doing well.  She would likely go home tomorrow.  LOS: 1 day     [name] 02/01/2018, 8:06 AM

## 2018-02-01 NOTE — Evaluation (Signed)
Physical Therapy Evaluation Patient Details Name: [name] MRN: [id number] DOB: [birth year] Today's Date: 02/01/2018   History of Present Illness  72 yo female s/p PLIF L1-5 laminectomy/decompression on 01/31/18. PMH significant for HTN, DM, asthma.  Clinical Impression  Pt admitted with above diagnosis. Pt currently with functional limitations due to the deficits listed below (see PT Problem List). At the time of PT eval pt was able to perform transfers and ambulation with gross min guard assist to close supervision for safety with RW for support. Pt currently with decreased tolerance for functional activity and RW beneficial for improved endurance during gait training. Pt was planning on using ramped entrance into home, however at end of session husband expressed several concerns regarding ramp and encouraged pt to use the 3 stairs in the front of the house. Will practice stairs next session so pt will be safe to enter at both locations at her home. Pt will benefit from skilled PT to increase their independence and safety with mobility to allow discharge to the venue listed below.       Follow Up Recommendations No PT follow up;Supervision for mobility/OOB    Equipment Recommendations  Rolling walker with 5" wheels(Youth size - pt is 4'8")    Recommendations for Other Services       Precautions / Restrictions Precautions Precautions: Back Precaution Comments: handout in room and pt was cued for precautions during functional mobility.  Restrictions Weight Bearing Restrictions: No      Mobility  Bed Mobility Overal bed mobility: Needs Assistance Bed Mobility: Sit to Supine       Sit to supine: Min guard   General bed mobility comments: VC's for sequencing during log roll. Heavy use of rails noted.   Transfers Overall transfer level: Needs assistance Equipment used: Rolling walker (2 wheeled) Transfers: Sit to/from Stand Sit to Stand: Min guard         General  transfer comment: VC's for hand placement on seated surface for safety.   Ambulation/Gait Ambulation/Gait assistance: Min guard;Supervision Ambulation Distance (Feet): 200 Feet Assistive device: Rolling walker (2 wheeled) Gait Pattern/deviations: Step-through pattern;Decreased stride length;Trunk flexed Gait velocity: Decreased Gait velocity interpretation: Below normal speed for age/gender General Gait Details: VC's for improved posture and forward gaze. Pt steady and reports decreased fatigue with RW use. Initially required hands-on guarding and progressed to close supervision for safety.   Stairs            Wheelchair Mobility    Modified Rankin (Stroke Patients Only)       Balance Overall balance assessment: Needs assistance Sitting-balance support: No upper extremity supported;Feet supported Sitting balance-Leahy Scale: Good     Standing balance support: Single extremity supported;During functional activity Standing balance-Leahy Scale: Fair                               Pertinent Vitals/Pain Pain Assessment: Faces Pain Score: 3  Faces Pain Scale: Hurts a little bit Pain Location: back Pain Descriptors / Indicators: Operative site guarding Pain Intervention(s): Monitored during session    Home Living Family/patient expects to be discharged to:: Private residence Living Arrangements: Spouse/significant other Available Help at Discharge: Friend(s);Available PRN/intermittently Type of Home: House Home Access: Ramped entrance;Other (comment)(3 steps no rail is the typical entrance - ramp in back)     Home Layout: One level Home Equipment: Cane - quad;Shower seat;Other (comment) Additional Comments: 2 inch step stool, hand held shower head,  spouse requires use of ramp    Prior Function Level of Independence: Independent with assistive device(s)         Comments: daughter and granddaughter (91) live in the house. 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 Pt sews for a living. Spouse arrived in wheelchair at the end of OT session     Hand Dominance   Dominant Hand: Right    Extremity/Trunk Assessment   Upper Extremity Assessment Upper Extremity Assessment: Defer to OT evaluation RUE Deficits / Details: R TSA per chart    Lower Extremity Assessment Lower Extremity Assessment: Generalized weakness    Cervical / Trunk Assessment Cervical / Trunk Assessment: Other exceptions Cervical / Trunk Exceptions: s/p surg  Communication   Communication: No difficulties  Cognition Arousal/Alertness: Awake/alert Behavior During Therapy: WFL for tasks assessed/performed Overall Cognitive Status: Within Functional Limits for tasks assessed                                 General Comments: pt becoming somewhat sleeping from medication givne just prior to the start of session      General Comments General comments (skin integrity, edema, etc.): dressing changed by RN on arrival    Exercises     Assessment/Plan    PT Assessment Patient needs continued PT services  PT Problem List Decreased strength;Decreased range of motion;Decreased activity tolerance;Decreased mobility;Decreased balance;Decreased knowledge of use of DME;Decreased safety awareness;Decreased knowledge of precautions;Pain       PT Treatment Interventions DME instruction;Gait training;Stair training;Functional mobility training;Therapeutic activities;Therapeutic exercise;Neuromuscular re-education;Patient/family education    PT Goals (Current goals can be found in the Care Plan section)  Acute Rehab PT Goals Patient Stated Goal: to get back to doing for myself and heal properly PT Goal Formulation: With patient/family Time For Goal Achievement: 02/08/18 Potential to Achieve Goals: Good    Frequency Min 5X/week   Barriers to discharge        Co-evaluation               AM-PAC PT  "6 Clicks" Daily Activity  Outcome Measure Difficulty turning over in bed (including adjusting bedclothes, sheets and blankets)?: A Little Difficulty moving from lying on back to sitting on the side of the bed? : A Little Difficulty sitting down on and standing up from a chair with arms (e.g., wheelchair, bedside commode, etc,.)?: A Little Help needed moving to and from a bed to chair (including a wheelchair)?: A Little Help needed walking in hospital room?: A Little Help needed climbing 3-5 steps with a railing? : A Little 6 Click Score: 18    End of Session Equipment Utilized During Treatment: Gait belt Activity Tolerance: Patient tolerated treatment well Patient left: in chair;with call bell/phone within reach;with family/visitor present Nurse Communication: Mobility status PT Visit Diagnosis: Unsteadiness on feet (R26.81);Pain;Other symptoms and signs involving the nervous system (R29.898) Pain - part of body: (back)    Time: 1119-1140 PT Time Calculation (min) (ACUTE ONLY): 21 min   Charges:   PT Evaluation $PT Eval Moderate Complexity: 1 Mod     PT G Codes:        Rolinda Roan, PT, DPT Acute Rehabilitation Services Pager: [phone]   Thelma Comp 02/01/2018, 12:24 PM

## 2018-02-01 NOTE — Plan of Care (Signed)
  Progressing Activity: Ability to tolerate increased activity will improve 02/01/2018 2253 - Progressing by Charlena Cross, RN Bowel/Gastric: Gastrointestinal status for postoperative course will improve 02/01/2018 2253 - Progressing by Charlena Cross, RN Physical Regulation: Postoperative complications will be avoided or minimized 02/01/2018 2253 - Progressing by Charlena Cross, RN

## 2018-02-01 NOTE — Care Management Note (Signed)
Case Management Note  Patient Details  Name: [name] MRN: [id number] Date of Birth: 1946/10/31  Subjective/Objective:  72 yr old female s/p L1-5 Laminectomy.                    Action/Plan: Case manager spoke with patient concerning discharge plan. Choice for Home Health Agency was offered. Referral was called to Corinna Lines, Liaison with [facility]. Patient will have support at discharge.    Expected Discharge Date:  (Pending)               Expected Discharge Plan:  [place]  In-House Referral:  NA  Discharge planning Services  CM Consult  Post Acute Care Choice:  Home Health Choice offered to:  Patient  DME Arranged:  3-N-1 DME Agency:  [place]:  OT, PT [facility] Agency:  [facility]  Status of Service:  Completed, signed off  If discussed at [employer] of Stay Meetings, dates discussed:    Additional Comments:  Ninfa Meeker, RN 02/01/2018, 11:05 AM

## 2018-02-02 DIAGNOSIS — M48062 Spinal stenosis, lumbar region with neurogenic claudication: Secondary | ICD-10-CM | POA: Diagnosis not present

## 2018-02-02 DIAGNOSIS — K219 Gastro-esophageal reflux disease without esophagitis: Secondary | ICD-10-CM | POA: Diagnosis not present

## 2018-02-02 DIAGNOSIS — E119 Type 2 diabetes mellitus without complications: Secondary | ICD-10-CM | POA: Diagnosis not present

## 2018-02-02 DIAGNOSIS — Z7984 Long term (current) use of oral hypoglycemic drugs: Secondary | ICD-10-CM | POA: Diagnosis not present

## 2018-02-02 DIAGNOSIS — M069 Rheumatoid arthritis, unspecified: Secondary | ICD-10-CM | POA: Diagnosis not present

## 2018-02-02 DIAGNOSIS — I10 Essential (primary) hypertension: Secondary | ICD-10-CM | POA: Diagnosis not present

## 2018-02-02 DIAGNOSIS — Z79899 Other long term (current) drug therapy: Secondary | ICD-10-CM | POA: Diagnosis not present

## 2018-02-02 DIAGNOSIS — M5416 Radiculopathy, lumbar region: Secondary | ICD-10-CM | POA: Diagnosis not present

## 2018-02-02 DIAGNOSIS — Z87891 Personal history of nicotine dependence: Secondary | ICD-10-CM | POA: Diagnosis not present

## 2018-02-02 LAB — GLUCOSE, CAPILLARY: GLUCOSE-CAPILLARY: 99 mg/dL (ref 65–99)

## 2018-02-02 MED ORDER — DOCUSATE SODIUM 100 MG PO CAPS: 100.0000 mg | ORAL_CAPSULE | Freq: Two times a day (BID) | ORAL | 0 refills | Status: DC

## 2018-02-02 MED ORDER — OXYCODONE HCL 5 MG PO TABS: 5.0000 mg | ORAL_TABLET | ORAL | 0 refills | Status: DC | PRN

## 2018-02-02 MED ORDER — CYCLOBENZAPRINE HCL 10 MG PO TABS: 10.0000 mg | ORAL_TABLET | Freq: Three times a day (TID) | ORAL | 0 refills | Status: DC | PRN

## 2018-02-02 MED ORDER — MUPIROCIN 2 % EX OINT: TOPICAL_OINTMENT | Freq: Two times a day (BID) | CUTANEOUS | Status: DC

## 2018-02-02 NOTE — Progress Notes (Signed)
Pt doing well. Pt and mother given D/C instructions with verbal understanding. Pt's incision is clean and dry with no sign of infection. Pt's IV was removed prior to D/C. Pt D/C'd home via wheelchair @ 1020 per MD order. Pt is stable @ D/C and has no other needs at this time. Holli Humbles, RN

## 2018-02-02 NOTE — Anesthesia Postprocedure Evaluation (Signed)
Anesthesia Post Note  Patient: PATTIE FLAHARTY  Procedure(s) Performed: LAMINECTOMY AND FORAMINOTOMY LUMBAR TWO- LUMBAR THREE, LUMBAR THREE- LUMBAR FOUR, LUMBAR FOUR- LUMBAR FIVE  (N/A Back)     Patient location during evaluation: PACU Anesthesia Type: General Level of consciousness: awake and alert Pain management: pain level controlled Vital Signs Assessment: post-procedure vital signs reviewed and stable Respiratory status: spontaneous breathing, nonlabored ventilation, respiratory function stable and patient connected to nasal cannula oxygen Cardiovascular status: blood pressure returned to baseline and stable Postop Assessment: no apparent nausea or vomiting Anesthetic complications: no    Last Vitals:  Vitals:   02/02/18 0554 02/02/18 0808  BP:  128/67  Pulse:  (!) 103  Resp:  18  Temp: 37.2 C 36.8 C  SpO2:  98%    Last Pain:  Vitals:   02/02/18 1000  TempSrc:   PainSc: 3                  Eufemio Strahm,JAMES TERRILL

## 2018-02-02 NOTE — Progress Notes (Signed)
Pt received youth RW from [facility] prior to D/C. Holli Humbles, RN

## 2018-02-02 NOTE — Discharge Instructions (Signed)

## 2018-02-02 NOTE — Care Management Obs Status (Signed)
[place] NOTIFICATION   Patient Details  Name: NEEMA BARREIRA MRN: [id number] Date of Birth: December 01, 1945   Medicare Observation Status Notification Given:  Yes    Erenest Rasher, RN 02/02/2018, 8:36 AM

## 2018-02-02 NOTE — Discharge Summary (Signed)
Physician Discharge Summary  Patient ID: [name] MRN: [id number] DOB/AGE: 06-11-1946 72 y.o.  Admit date: 01/31/2018 Discharge date: 02/02/2018  Admission Diagnoses: Lumbar spinal stenosis with neurogenic claudication, lumbago, lumbar radiculopathy Discharge Diagnoses: The same Active Problems:   Lumbar stenosis with neurogenic claudication   Discharged Condition: good  Hospital Course: I performed a L1-2, L2-3, L3-4 and L4-5 laminectomy/laminotomy/foraminotomies on the patient on 01/31/2018.  The surgery went well.  The patient's postoperative course was unremarkable.  On postoperative day #2 the patient requested discharge home.  She was given written and oral discharge instructions.  All her questions were answered.  Consults: Physical therapy Significant Diagnostic Studies: None Treatments: L1-2, L2-3, L3-4 and L4-5 laminectomy/laminotomy/foraminotomy using microdissection Discharge Exam: Blood pressure (!) 114/56, pulse (!) 119, temperature 99 F (37.2 C), temperature source Oral, resp. rate 16, height 4\' 8"  (1.422 m), weight 65.3 kg (144 lb), SpO2 99 %. The patient is alert and pleasant.  She looks well.  Her strength is grossly normal in her lower extremities.  Her dressing is intact.  Disposition: Home  Discharge Instructions    Call MD for:  difficulty breathing, headache or visual disturbances   Complete by:  As directed    Call MD for:  extreme fatigue   Complete by:  As directed    Call MD for:  hives   Complete by:  As directed    Call MD for:  persistant dizziness or light-headedness   Complete by:  As directed    Call MD for:  persistant nausea and vomiting   Complete by:  As directed    Call MD for:  redness, tenderness, or signs of infection (pain, swelling, redness, odor or green/yellow discharge around incision site)   Complete by:  As directed    Call MD for:  severe uncontrolled pain   Complete by:  As directed    Call MD for:  temperature >100.4    Complete by:  As directed    Diet - low sodium heart healthy   Complete by:  As directed    Discharge instructions   Complete by:  As directed    Call [phone] for a followup appointment. Take a stool softener while you are using pain medications.   Driving Restrictions   Complete by:  As directed    Do not drive for 2 weeks.   Increase activity slowly   Complete by:  As directed    Lifting restrictions   Complete by:  As directed    Do not lift more than 5 pounds. No excessive bending or twisting.   May shower / Bathe   Complete by:  As directed    Remove the dressing for 3 days after surgery.  You may shower, but leave the incision alone.   Remove dressing in 24 hours   Complete by:  As directed      Allergies as of 02/02/2018      Reactions   Ivp Dye [iodinated Diagnostic Agents] Nausea And Vomiting   Prednisone Other (See Comments)   reflux      Medication List    STOP taking these medications   HYDROcodone-acetaminophen 5-325 MG tablet Commonly known as:  NORCO/VICODIN     TAKE these medications   aspirin EC 81 MG tablet Take 81 mg by mouth daily.   bumetanide 0.5 MG tablet Commonly known as:  BUMEX Take 0.5 mg by mouth 2 (two) times daily.   cetirizine 10 MG tablet Commonly known as:  ZYRTEC Take 10 mg by mouth daily as needed for allergies (during Spring/Summer).   cholecalciferol 1000 units tablet Commonly known as:  VITAMIN D Take 1,000 Units by mouth daily.   cyclobenzaprine 10 MG tablet Commonly known as:  FLEXERIL Take 1 tablet (10 mg total) by mouth 3 (three) times daily as needed for muscle spasms.   diclofenac 75 MG EC tablet Commonly known as:  VOLTAREN Take 75 mg by mouth 2 (two) times daily.   diltiazem 120 MG 24 hr capsule Commonly known as:  CARDIZEM CD Take 120 mg by mouth daily before breakfast.   docusate sodium 100 MG capsule Commonly known as:  COLACE Take 1 capsule (100 mg total) by mouth 2 (two) times daily.   enalapril  20 MG tablet Commonly known as:  VASOTEC Take 20 mg by mouth at bedtime.   folic acid 174 MCG tablet Commonly known as:  FOLVITE Take 800 mcg by mouth daily.   gabapentin 300 MG capsule Commonly known as:  NEURONTIN Take 300 mg by mouth 2 (two) times daily.   lovastatin 40 MG tablet Commonly known as:  MEVACOR Take 40 mg by mouth at bedtime.   Magnesium 300 MG Caps Take 300 mg by mouth every other day.   metFORMIN 500 MG 24 hr tablet Commonly known as:  GLUCOPHAGE-XR Take 500-1,000 mg by mouth 2 (two) times daily. Take 1 tablet (500 mg) in the morning and take 2 tablets (1000 mg) with supper   methotrexate 2.5 MG tablet Commonly known as:  RHEUMATREX Take 12.5 mg by mouth every Sunday at [place]. Protect from light.   multivitamin with minerals Tabs tablet Take 1 tablet by mouth daily.   oxyCODONE 5 MG immediate release tablet Commonly known as:  Oxy IR/ROXICODONE Take 1 tablet (5 mg total) by mouth every 4 (four) hours as needed for moderate pain ((score 4 to 6)).   ranitidine 300 MG tablet Commonly known as:  ZANTAC Take 300 mg by mouth 2 (two) times daily. Lunch & supper   sodium chloride 0.65 % Soln nasal spray Commonly known as:  OCEAN Place 1-2 sprays into the nose 4 (four) times daily as needed for congestion.            Durable Medical Equipment  (From admission, onward)        Start     Ordered   02/01/18 1141  For home use only DME Walker rolling  Once    Comments:  Youth Walker  Question:  Patient needs a walker to treat with the following condition  Answer:  Status post spinal surgery   02/01/18 1140     Follow-up Information    [facility] Follow up.   Specialty:  Home Health Services Why:  A representative from [facility] will contact you to arrange start date and time for your therapy. Contact information: [address] [place] [address] [phone]           Signed: Ophelia Charter 02/02/2018, 7:37 AM

## 2018-02-02 NOTE — Progress Notes (Signed)
Physical Therapy Treatment Patient Details Name: [name] MRN: [id number] DOB: 1946/08/08 Today's Date: 02/02/2018    History of Present Illness 72 yo female s/p PLIF L1-5 laminectomy PMH: DM, HTN, arthritis, GERD, arrythmias.    PT Comments    Patient progressing with mobility and able to demonstrate bed mobility with good technique and discussed car transfers and accessing home via ramp.  No further skilled PT needs at this time. Stable for d/c with family assist.    Follow Up Recommendations  No PT follow up;Supervision for mobility/OOB     Equipment Recommendations  Rolling walker with 5" wheels    Recommendations for Other Services       Precautions / Restrictions Precautions Precautions: Back Precaution Comments: reviewed precautions with pt and family in room with mobility    Mobility  Bed Mobility   Bed Mobility: Sit to Sidelying;Rolling Rolling: Supervision       Sit to sidelying: Supervision General bed mobility comments: cues for technique and positioning with pillow under knees in supine, then able to transfer pillow to between knees when rolling to R side  Transfers   Equipment used: Rolling walker (2 wheeled) Transfers: Sit to/from Stand Sit to Stand: Supervision         General transfer comment: cues for hand placement, also reviewed car transfer technique and demonstrated  Ambulation/Gait Ambulation/Gait assistance: Supervision Ambulation Distance (Feet): 50 Feet(just back from walking in hallway) Assistive device: Rolling walker (2 wheeled) Gait Pattern/deviations: Step-through pattern;Decreased stride length     General Gait Details: adjusted walker for height, discussed stairs, but pt has ramp and no rails on stairs, so discussed using walker on ramp for home entry   Stairs            Wheelchair Mobility    Modified Rankin (Stroke Patients Only)       Balance     Sitting balance-Leahy Scale: Good       Standing  balance-Leahy Scale: Fair                              Cognition Arousal/Alertness: Awake/alert Behavior During Therapy: WFL for tasks assessed/performed Overall Cognitive Status: Within Functional Limits for tasks assessed                                        Exercises      General Comments General comments (skin integrity, edema, etc.): also educated in ankle pumps for circulation, walking program for home      Pertinent Vitals/Pain Faces Pain Scale: Hurts a little bit Pain Location: back Pain Descriptors / Indicators: Operative site guarding Pain Intervention(s): Monitored during session;Repositioned    Home Living                      Prior Function            PT Goals (current goals can now be found in the care plan section) Progress towards PT goals: Progressing toward goals    Frequency    Min 5X/week      PT Plan Current plan remains appropriate    Co-evaluation              AM-PAC PT "6 Clicks" Daily Activity  Outcome Measure  Difficulty turning over in bed (including adjusting bedclothes, sheets and blankets)?: A Little Difficulty  moving from lying on back to sitting on the side of the bed? : A Little Difficulty sitting down on and standing up from a chair with arms (e.g., wheelchair, bedside commode, etc,.)?: A Little Help needed moving to and from a bed to chair (including a wheelchair)?: A Little Help needed walking in hospital room?: A Little Help needed climbing 3-5 steps with a railing? : A Little 6 Click Score: 18    End of Session Equipment Utilized During Treatment: Gait belt Activity Tolerance: Patient tolerated treatment well Patient left: in bed;with family/visitor present;with call bell/phone within reach   PT Visit Diagnosis: Unsteadiness on feet (R26.81);Other symptoms and signs involving the nervous system (R29.898)     Time: 2103-1281 PT Time Calculation (min) (ACUTE ONLY): 11  min  Charges:  $Therapeutic Activity: 8-22 mins                    G CodesMagda Kiel, [place] [phone] 02/02/2018    Reginia Naas 02/02/2018, 10:14 AM

## 2018-02-03 ENCOUNTER — Ambulatory Visit (INDEPENDENT_AMBULATORY_CARE_PROVIDER_SITE_OTHER): Payer: Medicare HMO | Admitting: Ophthalmology

## 2018-02-03 ENCOUNTER — Other Ambulatory Visit: Payer: Self-pay

## 2018-02-03 NOTE — Patient Outreach (Addendum)
[facility]) Care Management  02/03/2018  [name] 1946/02/11 [id number]   Referral Date: 3 /14/19  Date of Admission: 01/31/18  Diagnosis:  Laminectomy Date of Discharge: 02/02/18 Facility:  [place]: [employer]  Outreach attempt # 1 Spoke with patient.  She is able to verify HIPAA. Patient states she is doing good and that she has been up walking with the assistance of her mother.  She states that her pain is better and that her pain is mainly surgical pain.  Patient reports that her surgical area looks good per her mother and not signs of infection.  Patient states that [facility] has made contact to start home health.    Social: Patient lives in the home with spouse and others. Patient states that her mother is there with her assisting her.  Conditions: Patient has history of chronic back pain and recently underwent a laminectomy.  Patient also has a history of diabetes and HTN.  Patient reports that her sugars are doing good.   Medications: Patient able to review medications. Patient offers no concerns.   Appointments: Patient waiting for call back from Dr. Arnoldo Morale office for follow up appointment.  Plan: RN CM will contact patient weekly for transition of care and patient agrees to contact.   RN CM will send update to physician.     Jone Baseman, RN, MSN [facility] Care Management Care Management Coordinator Direct Line [phone] Toll Free: [phone]  Fax: [phone]

## 2018-02-04 DIAGNOSIS — M1990 Unspecified osteoarthritis, unspecified site: Secondary | ICD-10-CM | POA: Diagnosis not present

## 2018-02-04 DIAGNOSIS — M545 Low back pain: Secondary | ICD-10-CM | POA: Diagnosis not present

## 2018-02-04 DIAGNOSIS — M109 Gout, unspecified: Secondary | ICD-10-CM | POA: Diagnosis not present

## 2018-02-04 DIAGNOSIS — M5416 Radiculopathy, lumbar region: Secondary | ICD-10-CM | POA: Diagnosis not present

## 2018-02-04 DIAGNOSIS — Z79899 Other long term (current) drug therapy: Secondary | ICD-10-CM | POA: Diagnosis not present

## 2018-02-04 DIAGNOSIS — M79673 Pain in unspecified foot: Secondary | ICD-10-CM | POA: Diagnosis not present

## 2018-02-04 DIAGNOSIS — M0609 Rheumatoid arthritis without rheumatoid factor, multiple sites: Secondary | ICD-10-CM | POA: Diagnosis not present

## 2018-02-04 NOTE — Progress Notes (Signed)
Occupational Therapy Treatment (late entry due to epic down time) Patient Details Name: [name] MRN: [id number] DOB: September 03, 1946 Today's Date: 02/04/2018    History of present illness 72 yo female s/p PLIF L1-5 laminectomy PMH: DM, HTN, arthritis, GERD, arrythmias.   OT comments  Pt is adequate level to d/c home at this time . Mother present and able to (A) upon initial d/c home.    Follow Up Recommendations  Home health OT    Equipment Recommendations  Other (comment)    Recommendations for Other Services PT consult    Precautions / Restrictions Precautions Precautions: Back Precaution Comments: good recall       Mobility Bed Mobility               General bed mobility comments: up in hall on arrival  Transfers Overall transfer level: Modified independent                    Balance Overall balance assessment: Modified Independent                                         ADL either performed or assessed with clinical judgement   ADL Overall ADL's : Modified independent                                       General ADL Comments: dressing with Ae that was purchased the previous day and mother present for all education. pt dressed and demonstrate bathroom transfers for washing up     Vision       Perception     Praxis      Cognition Arousal/Alertness: Awake/alert Behavior During Therapy: WFL for tasks assessed/performed Overall Cognitive Status: Within Functional Limits for tasks assessed                                 General Comments: w        Exercises     Shoulder Instructions       General Comments      Pertinent Vitals/ Pain       Pain Assessment: No/denies pain  Home Living                                          Prior Functioning/Environment              Frequency  Min 3X/week        Progress Toward Goals  OT Goals(current goals can  now be found in the care plan section)  Progress towards OT goals: Goals met/education completed, patient discharged from OT  Acute Rehab OT Goals Patient Stated Goal: to d/c home OT Goal Formulation: With patient Time For Goal Achievement: 03/18/18 Potential to Achieve Goals: Good ADL Goals Pt Will Transfer to Toilet: with supervision;ambulating;grab bars;regular height toilet Pt Will Perform Tub/Shower Transfer: Tub transfer;rolling walker;tub bench;shower seat Additional ADL Goal #1: pt will complete bed mobility supervision level as precursor to [place] Discharge plan remains appropriate    Co-evaluation                 AM-PAC PT "  6 Clicks" Daily Activity     Outcome Measure   Help from another person eating meals?: None Help from another person taking care of personal grooming?: None Help from another person toileting, which includes using toliet, bedpan, or urinal?: None Help from another person bathing (including washing, rinsing, drying)?: None Help from another person to put on and taking off regular upper body clothing?: None Help from another person to put on and taking off regular lower body clothing?: None 6 Click Score: 24    End of Session Equipment Utilized During Treatment: Rolling walker  OT Visit Diagnosis: Unsteadiness on feet (R26.81);Muscle weakness (generalized) (M62.81)   Activity Tolerance Patient tolerated treatment well   Patient Left in chair;with call bell/phone within reach   Nurse Communication Mobility status;Precautions        Time: 2493-2419 OT Time Calculation (min): 13 min  Charges:     Jeri Modena   OTR/L Pager: [phone] Office: [phone] .    Parke Poisson B 02/04/2018, 4:16 PM

## 2018-02-05 DIAGNOSIS — M5410 Radiculopathy, site unspecified: Secondary | ICD-10-CM | POA: Diagnosis not present

## 2018-02-05 DIAGNOSIS — Z4789 Encounter for other orthopedic aftercare: Secondary | ICD-10-CM | POA: Diagnosis not present

## 2018-02-05 DIAGNOSIS — M48061 Spinal stenosis, lumbar region without neurogenic claudication: Secondary | ICD-10-CM | POA: Diagnosis not present

## 2018-02-05 DIAGNOSIS — E785 Hyperlipidemia, unspecified: Secondary | ICD-10-CM | POA: Diagnosis not present

## 2018-02-05 DIAGNOSIS — I10 Essential (primary) hypertension: Secondary | ICD-10-CM | POA: Diagnosis not present

## 2018-02-05 DIAGNOSIS — M1990 Unspecified osteoarthritis, unspecified site: Secondary | ICD-10-CM | POA: Diagnosis not present

## 2018-02-05 DIAGNOSIS — K219 Gastro-esophageal reflux disease without esophagitis: Secondary | ICD-10-CM | POA: Diagnosis not present

## 2018-02-05 DIAGNOSIS — J45909 Unspecified asthma, uncomplicated: Secondary | ICD-10-CM | POA: Diagnosis not present

## 2018-02-05 DIAGNOSIS — E119 Type 2 diabetes mellitus without complications: Secondary | ICD-10-CM | POA: Diagnosis not present

## 2018-02-08 DIAGNOSIS — E1122 Type 2 diabetes mellitus with diabetic chronic kidney disease: Secondary | ICD-10-CM | POA: Diagnosis not present

## 2018-02-08 DIAGNOSIS — E785 Hyperlipidemia, unspecified: Secondary | ICD-10-CM | POA: Diagnosis not present

## 2018-02-08 DIAGNOSIS — M0609 Rheumatoid arthritis without rheumatoid factor, multiple sites: Secondary | ICD-10-CM | POA: Diagnosis not present

## 2018-02-08 DIAGNOSIS — I129 Hypertensive chronic kidney disease with stage 1 through stage 4 chronic kidney disease, or unspecified chronic kidney disease: Secondary | ICD-10-CM | POA: Diagnosis not present

## 2018-02-08 DIAGNOSIS — Z79899 Other long term (current) drug therapy: Secondary | ICD-10-CM | POA: Diagnosis not present

## 2018-02-08 DIAGNOSIS — E559 Vitamin D deficiency, unspecified: Secondary | ICD-10-CM | POA: Diagnosis not present

## 2018-02-09 ENCOUNTER — Other Ambulatory Visit: Payer: Self-pay

## 2018-02-09 NOTE — Patient Outreach (Signed)
[facility]) Care Management  [name]  02/09/2018   [name] Sep 25, 1946 [id number]  Subjective: Telephone call to patient for weekly transition of care call. Patient reports she is doing great.  Patient reports she is walking quite a bit with her walker.  Patient excited to say that she is pain free in her back.  She reports she did have some pain from gout but she received an injection to her toe and that is better.  Patient reports that sugars are good.  Patient's mother continues to be there to assist her but will be leaving on Saturday.  Patient states overall she feels good and denies any concerns presently.    Objective:   Encounter Medications:  Outpatient Encounter Medications as of 02/09/2018  Medication Sig Note  . aspirin EC 81 MG tablet Take 81 mg by mouth daily. 01/24/2018: On hold due to upcoming procedure.  . bumetanide (BUMEX) 0.5 MG tablet Take 0.5 mg by mouth 2 (two) times daily.     . cetirizine (ZYRTEC) 10 MG tablet Take 10 mg by mouth daily as needed for allergies (during Spring/Summer).   . cholecalciferol (VITAMIN D) 1000 units tablet Take 1,000 Units by mouth daily.   . cyclobenzaprine (FLEXERIL) 10 MG tablet Take 1 tablet (10 mg total) by mouth 3 (three) times daily as needed for muscle spasms.   . diclofenac (VOLTAREN) 75 MG EC tablet Take 75 mg by mouth 2 (two) times daily.   Marland Kitchen diltiazem (CARDIZEM CD) 120 MG 24 hr capsule Take 120 mg by mouth daily before breakfast.    . docusate sodium (COLACE) 100 MG capsule Take 1 capsule (100 mg total) by mouth 2 (two) times daily.   . enalapril (VASOTEC) 20 MG tablet Take 20 mg by mouth at bedtime.    . folic acid (FOLVITE) 841 MCG tablet Take 800 mcg by mouth daily.   Marland Kitchen gabapentin (NEURONTIN) 300 MG capsule Take 300 mg by mouth 2 (two) times daily.    Marland Kitchen lovastatin (MEVACOR) 40 MG tablet Take 40 mg by mouth at bedtime.    . Magnesium 300 MG CAPS Take 300 mg by mouth every other day.   .  metFORMIN (GLUCOPHAGE-XR) 500 MG 24 hr tablet Take 500-1,000 mg by mouth 2 (two) times daily. Take 1 tablet (500 mg) in the morning and take 2 tablets (1000 mg) with supper   . methotrexate (RHEUMATREX) 2.5 MG tablet Take 12.5 mg by mouth every Sunday at [place]. Protect from light.    . Multiple Vitamin (MULTIVITAMIN WITH MINERALS) TABS tablet Take 1 tablet by mouth daily.   Marland Kitchen oxyCODONE (OXY IR/ROXICODONE) 5 MG immediate release tablet Take 1 tablet (5 mg total) by mouth every 4 (four) hours as needed for moderate pain ((score 4 to 6)).   Marland Kitchen ranitidine (ZANTAC) 300 MG tablet Take 300 mg by mouth 2 (two) times daily. Lunch & supper   . sodium chloride (OCEAN) 0.65 % SOLN nasal spray Place 1-2 sprays into the nose 4 (four) times daily as needed for congestion.    No facility-administered encounter medications on file as of 02/09/2018.     Functional Status:  In your present state of health, do you have any difficulty performing the following activities: 02/03/2018 01/31/2018  Hearing? N N  Vision? N N  Difficulty concentrating or making decisions? N N  Walking or climbing stairs? Y Y  Dressing or bathing? N N  Doing errands, shopping? N N  Comment - -  Preparing Food and eating ? N -  Using the Toilet? N -  In the past six months, have you accidently leaked urine? N -  Do you have problems with loss of bowel control? N -  Managing your Medications? N -  Managing your Finances? N -  Housekeeping or managing your Housekeeping? N -  Some recent data might be hidden    Fall/Depression Screening: Fall Risk  02/03/2018 12/29/2017 12/01/2017  Falls in the past year? Yes Yes Yes  Number falls in past yr: - 1 1  Injury with Fall? - - -  Follow up - - -   PHQ 2/9 Scores 02/03/2018 12/29/2017 12/01/2017 08/13/2017 06/29/2017 05/27/2017 05/04/2017  PHQ - 2 Score 0 0 0 0 0 0 0    Assessment:  Patient continues to benefit from CM call for transition of care.    Plan:  [facility] CM Care Plan  Problem One     Most Recent Value  Care Plan Problem One  Knowledge Deficit Diabetes  Role Documenting the Problem One  Care Management Telephonic Coordinator  Care Plan for Problem One  Active  THN Long Term Goal   Patient will report A1c 5.5 or less within 90 days.  THN Long Term Goal Start Date  02/03/18  Interventions for Problem One Long Term Goal  Patient montioring sugars.  Sugars less than 150.  Reiterated watching her diet.      [facility] CM Care Plan Problem Two     Most Recent Value  Care Plan Problem Two  Chronic Back Pain-recent hospitalization for surgery  Role Documenting the Problem Two  Care Management Telephonic Coordinator  Care Plan for Problem Two  Active  Interventions for Problem Two Long Term Goal   Patient continues no bending, lifting or twisting.  Patient ambulating with walker.  Patient denies any problems with surgical site infection.  THN Long Term Goal  Patient will not readmit to the hospital within 31 days.  THN Long Term Goal Start Date  02/03/18  THN CM Short Term Goal #1   Patient will report pain managed better after surgery within 14 days  THN CM Short Term Goal #1 Start Date  02/03/18  [facility] CM Short Term Goal #1 Met Date   02/09/18  Interventions for Short Term Goal #2   Patient reports being pain free.  THN CM Short Term Goal #2   Patient will report no problems with constipation post op within 7 days.   THN CM Short Term Goal #2 Start Date  02/03/18  [facility] CM Short Term Goal #2 Met Date  02/09/18  Interventions for Short Term Goal #2  Patient denies any problems with constipation.       RN CM will outreach patient again next week and patient agrees to next outreach.  Jone Baseman, RN, MSN [facility] Care Management Care Management Coordinator Direct Line [phone] Toll Free: [phone]  Fax: [phone]

## 2018-02-11 DIAGNOSIS — J45909 Unspecified asthma, uncomplicated: Secondary | ICD-10-CM | POA: Diagnosis not present

## 2018-02-11 DIAGNOSIS — I10 Essential (primary) hypertension: Secondary | ICD-10-CM | POA: Diagnosis not present

## 2018-02-11 DIAGNOSIS — E119 Type 2 diabetes mellitus without complications: Secondary | ICD-10-CM | POA: Diagnosis not present

## 2018-02-11 DIAGNOSIS — M5410 Radiculopathy, site unspecified: Secondary | ICD-10-CM | POA: Diagnosis not present

## 2018-02-11 DIAGNOSIS — K219 Gastro-esophageal reflux disease without esophagitis: Secondary | ICD-10-CM | POA: Diagnosis not present

## 2018-02-11 DIAGNOSIS — Z4789 Encounter for other orthopedic aftercare: Secondary | ICD-10-CM | POA: Diagnosis not present

## 2018-02-11 DIAGNOSIS — M48061 Spinal stenosis, lumbar region without neurogenic claudication: Secondary | ICD-10-CM | POA: Diagnosis not present

## 2018-02-11 DIAGNOSIS — E785 Hyperlipidemia, unspecified: Secondary | ICD-10-CM | POA: Diagnosis not present

## 2018-02-11 DIAGNOSIS — M1990 Unspecified osteoarthritis, unspecified site: Secondary | ICD-10-CM | POA: Diagnosis not present

## 2018-02-16 ENCOUNTER — Other Ambulatory Visit: Payer: Self-pay

## 2018-02-16 DIAGNOSIS — E119 Type 2 diabetes mellitus without complications: Secondary | ICD-10-CM | POA: Diagnosis not present

## 2018-02-16 DIAGNOSIS — J45909 Unspecified asthma, uncomplicated: Secondary | ICD-10-CM | POA: Diagnosis not present

## 2018-02-16 DIAGNOSIS — I10 Essential (primary) hypertension: Secondary | ICD-10-CM | POA: Diagnosis not present

## 2018-02-16 DIAGNOSIS — M1990 Unspecified osteoarthritis, unspecified site: Secondary | ICD-10-CM | POA: Diagnosis not present

## 2018-02-16 DIAGNOSIS — Z4789 Encounter for other orthopedic aftercare: Secondary | ICD-10-CM | POA: Diagnosis not present

## 2018-02-16 DIAGNOSIS — M5410 Radiculopathy, site unspecified: Secondary | ICD-10-CM | POA: Diagnosis not present

## 2018-02-16 DIAGNOSIS — M48061 Spinal stenosis, lumbar region without neurogenic claudication: Secondary | ICD-10-CM | POA: Diagnosis not present

## 2018-02-16 DIAGNOSIS — E785 Hyperlipidemia, unspecified: Secondary | ICD-10-CM | POA: Diagnosis not present

## 2018-02-16 DIAGNOSIS — K219 Gastro-esophageal reflux disease without esophagitis: Secondary | ICD-10-CM | POA: Diagnosis not present

## 2018-02-16 NOTE — Patient Outreach (Signed)
[place] [facility]) Care Management  [place]  02/16/2018   [name] Sep 15, 1946 [id number]  Subjective: Telephone call to patient for weekly TOC.  Spoke with patient.  She is able to verify HIPAA.  Patient reports she is doing good.  Patient reports that she is getting around good and was able to get outside yesterday and walk. Patient reports that therapy is coming today and tomorrow.  Patient denies any pain.  Reiterated with patient importance of continuing back precautions and monitoring blood sugar.  She verbalized understanding.     Objective:   Encounter Medications:  Outpatient Encounter Medications as of 02/16/2018  Medication Sig Note  . aspirin EC 81 MG tablet Take 81 mg by mouth daily. 01/24/2018: On hold due to upcoming procedure.  . bumetanide (BUMEX) 0.5 MG tablet Take 0.5 mg by mouth 2 (two) times daily.     . cetirizine (ZYRTEC) 10 MG tablet Take 10 mg by mouth daily as needed for allergies (during Spring/Summer).   . cholecalciferol (VITAMIN D) 1000 units tablet Take 1,000 Units by mouth daily.   . cyclobenzaprine (FLEXERIL) 10 MG tablet Take 1 tablet (10 mg total) by mouth 3 (three) times daily as needed for muscle spasms.   . diclofenac (VOLTAREN) 75 MG EC tablet Take 75 mg by mouth 2 (two) times daily.   Marland Kitchen diltiazem (CARDIZEM CD) 120 MG 24 hr capsule Take 120 mg by mouth daily before breakfast.    . docusate sodium (COLACE) 100 MG capsule Take 1 capsule (100 mg total) by mouth 2 (two) times daily.   . enalapril (VASOTEC) 20 MG tablet Take 20 mg by mouth at bedtime.    . folic acid (FOLVITE) 431 MCG tablet Take 800 mcg by mouth daily.   Marland Kitchen gabapentin (NEURONTIN) 300 MG capsule Take 300 mg by mouth 2 (two) times daily.    Marland Kitchen lovastatin (MEVACOR) 40 MG tablet Take 40 mg by mouth at bedtime.    . Magnesium 300 MG CAPS Take 300 mg by mouth every other day.   . metFORMIN (GLUCOPHAGE-XR) 500 MG 24 hr tablet Take 500-1,000 mg by mouth 2 (two) times daily.  Take 1 tablet (500 mg) in the morning and take 2 tablets (1000 mg) with supper   . methotrexate (RHEUMATREX) 2.5 MG tablet Take 12.5 mg by mouth every Sunday at [place]. Protect from light.    . Multiple Vitamin (MULTIVITAMIN WITH MINERALS) TABS tablet Take 1 tablet by mouth daily.   Marland Kitchen oxyCODONE (OXY IR/ROXICODONE) 5 MG immediate release tablet Take 1 tablet (5 mg total) by mouth every 4 (four) hours as needed for moderate pain ((score 4 to 6)).   Marland Kitchen ranitidine (ZANTAC) 300 MG tablet Take 300 mg by mouth 2 (two) times daily. Lunch & supper   . sodium chloride (OCEAN) 0.65 % SOLN nasal spray Place 1-2 sprays into the nose 4 (four) times daily as needed for congestion.    No facility-administered encounter medications on file as of 02/16/2018.     Functional Status:  In your present state of health, do you have any difficulty performing the following activities: 02/03/2018 01/31/2018  Hearing? N N  Vision? N N  Difficulty concentrating or making decisions? N N  Walking or climbing stairs? Y Y  Dressing or bathing? N N  Doing errands, shopping? N [place] and eating ? N -  Using the Toilet? N -  In the past six months, have you  accidently leaked urine? N -  Do you have problems with loss of bowel control? N -  Managing your Medications? N -  Managing your Finances? N -  Housekeeping or managing your Housekeeping? N -  Some recent data might be hidden    Fall/Depression Screening: Fall Risk  02/03/2018 12/29/2017 12/01/2017  Falls in the past year? Yes Yes Yes  Number falls in past yr: - 1 1  Injury with Fall? - - -  Follow up - - -   PHQ 2/9 Scores 02/03/2018 12/29/2017 12/01/2017 08/13/2017 06/29/2017 05/27/2017 05/04/2017  PHQ - 2 Score 0 0 0 0 0 0 0    Assessment: Patient continues to benefit from care manager outreach for disease management and support.    Plan:  [facility] CM Care Plan Problem One     Most Recent Value  Care Plan Problem One  Knowledge  Deficit Diabetes  Role Documenting the Problem One  Care Management Telephonic Coordinator  Care Plan for Problem One  Active  THN Long Term Goal   Patient will report A1c 5.5 or less within 90 days.  THN Long Term Goal Start Date  02/03/18  Interventions for Problem One Long Term Goal  RN CM reiterated with patient importance of monitoring blood sugar and watching diet.      [facility] CM Care Plan Problem Two     Most Recent Value  Care Plan Problem Two  Chronic Back Pain-recent hospitalization for surgery  Role Documenting the Problem Two  Care Management Telephonic Coordinator  Care Plan for Problem Two  Active  Interventions for Problem Two Long Term Goal   RN CM reiterated with patient continuing  no twisting, bending or lifting.   THN Long Term Goal  Patient will not readmit to the hospital within 31 days.  THN Long Term Goal Start Date  02/03/18  [facility] CM Short Term Goal #1   Patient will report pain managed better after surgery within 14 days  THN CM Short Term Goal #1 Start Date  02/03/18  [facility] CM Short Term Goal #1 Met Date   02/09/18  THN CM Short Term Goal #2   Patient will report no problems with constipation post op within 7 days.   THN CM Short Term Goal #2 Start Date  02/03/18  [facility] CM Short Term Goal #2 Met Date  02/09/18     RN CM will contact patient next week and patient agrees to next outreach.    Jone Baseman, RN, MSN [facility] Care Management Care Management Coordinator Direct Line [phone] Toll Free: [phone]  Fax: [phone]

## 2018-02-18 DIAGNOSIS — Z4789 Encounter for other orthopedic aftercare: Secondary | ICD-10-CM | POA: Diagnosis not present

## 2018-02-18 DIAGNOSIS — K219 Gastro-esophageal reflux disease without esophagitis: Secondary | ICD-10-CM | POA: Diagnosis not present

## 2018-02-18 DIAGNOSIS — M1990 Unspecified osteoarthritis, unspecified site: Secondary | ICD-10-CM | POA: Diagnosis not present

## 2018-02-18 DIAGNOSIS — M5410 Radiculopathy, site unspecified: Secondary | ICD-10-CM | POA: Diagnosis not present

## 2018-02-18 DIAGNOSIS — I10 Essential (primary) hypertension: Secondary | ICD-10-CM | POA: Diagnosis not present

## 2018-02-18 DIAGNOSIS — M48061 Spinal stenosis, lumbar region without neurogenic claudication: Secondary | ICD-10-CM | POA: Diagnosis not present

## 2018-02-18 DIAGNOSIS — E119 Type 2 diabetes mellitus without complications: Secondary | ICD-10-CM | POA: Diagnosis not present

## 2018-02-18 DIAGNOSIS — E785 Hyperlipidemia, unspecified: Secondary | ICD-10-CM | POA: Diagnosis not present

## 2018-02-18 DIAGNOSIS — J45909 Unspecified asthma, uncomplicated: Secondary | ICD-10-CM | POA: Diagnosis not present

## 2018-02-21 DIAGNOSIS — K219 Gastro-esophageal reflux disease without esophagitis: Secondary | ICD-10-CM | POA: Diagnosis not present

## 2018-02-21 DIAGNOSIS — M48061 Spinal stenosis, lumbar region without neurogenic claudication: Secondary | ICD-10-CM | POA: Diagnosis not present

## 2018-02-21 DIAGNOSIS — M5410 Radiculopathy, site unspecified: Secondary | ICD-10-CM | POA: Diagnosis not present

## 2018-02-21 DIAGNOSIS — J45909 Unspecified asthma, uncomplicated: Secondary | ICD-10-CM | POA: Diagnosis not present

## 2018-02-21 DIAGNOSIS — I10 Essential (primary) hypertension: Secondary | ICD-10-CM | POA: Diagnosis not present

## 2018-02-21 DIAGNOSIS — M1990 Unspecified osteoarthritis, unspecified site: Secondary | ICD-10-CM | POA: Diagnosis not present

## 2018-02-21 DIAGNOSIS — Z4789 Encounter for other orthopedic aftercare: Secondary | ICD-10-CM | POA: Diagnosis not present

## 2018-02-21 DIAGNOSIS — E785 Hyperlipidemia, unspecified: Secondary | ICD-10-CM | POA: Diagnosis not present

## 2018-02-21 DIAGNOSIS — E119 Type 2 diabetes mellitus without complications: Secondary | ICD-10-CM | POA: Diagnosis not present

## 2018-02-23 ENCOUNTER — Other Ambulatory Visit: Payer: Self-pay

## 2018-02-23 DIAGNOSIS — E119 Type 2 diabetes mellitus without complications: Secondary | ICD-10-CM | POA: Diagnosis not present

## 2018-02-23 DIAGNOSIS — E785 Hyperlipidemia, unspecified: Secondary | ICD-10-CM | POA: Diagnosis not present

## 2018-02-23 DIAGNOSIS — J45909 Unspecified asthma, uncomplicated: Secondary | ICD-10-CM | POA: Diagnosis not present

## 2018-02-23 DIAGNOSIS — I10 Essential (primary) hypertension: Secondary | ICD-10-CM | POA: Diagnosis not present

## 2018-02-23 DIAGNOSIS — K219 Gastro-esophageal reflux disease without esophagitis: Secondary | ICD-10-CM | POA: Diagnosis not present

## 2018-02-23 DIAGNOSIS — M1990 Unspecified osteoarthritis, unspecified site: Secondary | ICD-10-CM | POA: Diagnosis not present

## 2018-02-23 DIAGNOSIS — Z4789 Encounter for other orthopedic aftercare: Secondary | ICD-10-CM | POA: Diagnosis not present

## 2018-02-23 DIAGNOSIS — M5410 Radiculopathy, site unspecified: Secondary | ICD-10-CM | POA: Diagnosis not present

## 2018-02-23 DIAGNOSIS — M48061 Spinal stenosis, lumbar region without neurogenic claudication: Secondary | ICD-10-CM | POA: Diagnosis not present

## 2018-02-23 NOTE — Patient Outreach (Signed)
[place] [facility]) Care Management  02/23/2018  [name] 09-25-1946 [id number]   Telephone call to patient for final transition of care call. No answer. HIPAA compliant voice message left.  Plan: RN CM will contact patient again within 4 business days.   Jone Baseman, RN, MSN [facility] Care Management Care Management Coordinator Direct Line [phone] Toll Free: [phone]  Fax: [phone]

## 2018-02-25 ENCOUNTER — Other Ambulatory Visit: Payer: Self-pay

## 2018-02-25 DIAGNOSIS — M48061 Spinal stenosis, lumbar region without neurogenic claudication: Secondary | ICD-10-CM | POA: Diagnosis not present

## 2018-02-25 DIAGNOSIS — E785 Hyperlipidemia, unspecified: Secondary | ICD-10-CM | POA: Diagnosis not present

## 2018-02-25 DIAGNOSIS — Z4789 Encounter for other orthopedic aftercare: Secondary | ICD-10-CM | POA: Diagnosis not present

## 2018-02-25 DIAGNOSIS — K219 Gastro-esophageal reflux disease without esophagitis: Secondary | ICD-10-CM | POA: Diagnosis not present

## 2018-02-25 DIAGNOSIS — J45909 Unspecified asthma, uncomplicated: Secondary | ICD-10-CM | POA: Diagnosis not present

## 2018-02-25 DIAGNOSIS — E119 Type 2 diabetes mellitus without complications: Secondary | ICD-10-CM | POA: Diagnosis not present

## 2018-02-25 DIAGNOSIS — M1990 Unspecified osteoarthritis, unspecified site: Secondary | ICD-10-CM | POA: Diagnosis not present

## 2018-02-25 DIAGNOSIS — I10 Essential (primary) hypertension: Secondary | ICD-10-CM | POA: Diagnosis not present

## 2018-02-25 DIAGNOSIS — M5410 Radiculopathy, site unspecified: Secondary | ICD-10-CM | POA: Diagnosis not present

## 2018-02-25 NOTE — Patient Outreach (Signed)
[place] [facility]) Care Management  [place]  02/25/2018   [name] 19-Jan-1946 [id number]  Subjective: Telephone call to patient for transition of care final weekly call.  Spoke with patient she is able to verify HIPAA.  Patient reports she is doing good.  She reports that she saw the surgeon on Tuesday and he feels she is doing great.  Patient reports she does not see him again until August.  Patient reports she will be doing more walking and exercise as recommended by the doctor but no bending, lifting, or twisting. Reiterated with patient the importance of following those precautions. She verbalized understanding.  Patient reports that her blood sugar remain in the low 100's.  Reiterated continuing to watch her diet. She verbalized understanding.    Objective:   Encounter Medications:  Outpatient Encounter Medications as of 02/25/2018  Medication Sig Note  . aspirin EC 81 MG tablet Take 81 mg by mouth daily. 01/24/2018: On hold due to upcoming procedure.  . bumetanide (BUMEX) 0.5 MG tablet Take 0.5 mg by mouth 2 (two) times daily.     . cetirizine (ZYRTEC) 10 MG tablet Take 10 mg by mouth daily as needed for allergies (during Spring/Summer).   . cholecalciferol (VITAMIN D) 1000 units tablet Take 1,000 Units by mouth daily.   . cyclobenzaprine (FLEXERIL) 10 MG tablet Take 1 tablet (10 mg total) by mouth 3 (three) times daily as needed for muscle spasms.   . diclofenac (VOLTAREN) 75 MG EC tablet Take 75 mg by mouth 2 (two) times daily.   Marland Kitchen diltiazem (CARDIZEM CD) 120 MG 24 hr capsule Take 120 mg by mouth daily before breakfast.    . docusate sodium (COLACE) 100 MG capsule Take 1 capsule (100 mg total) by mouth 2 (two) times daily.   . enalapril (VASOTEC) 20 MG tablet Take 20 mg by mouth at bedtime.    . folic acid (FOLVITE) 338 MCG tablet Take 800 mcg by mouth daily.   Marland Kitchen gabapentin (NEURONTIN) 300 MG capsule Take 300 mg by mouth 2 (two) times daily.    Marland Kitchen lovastatin  (MEVACOR) 40 MG tablet Take 40 mg by mouth at bedtime.    . Magnesium 300 MG CAPS Take 300 mg by mouth every other day.   . metFORMIN (GLUCOPHAGE-XR) 500 MG 24 hr tablet Take 500-1,000 mg by mouth 2 (two) times daily. Take 1 tablet (500 mg) in the morning and take 2 tablets (1000 mg) with supper   . methotrexate (RHEUMATREX) 2.5 MG tablet Take 12.5 mg by mouth every Sunday at [place]. Protect from light.    . Multiple Vitamin (MULTIVITAMIN WITH MINERALS) TABS tablet Take 1 tablet by mouth daily.   Marland Kitchen oxyCODONE (OXY IR/ROXICODONE) 5 MG immediate release tablet Take 1 tablet (5 mg total) by mouth every 4 (four) hours as needed for moderate pain ((score 4 to 6)).   Marland Kitchen ranitidine (ZANTAC) 300 MG tablet Take 300 mg by mouth 2 (two) times daily. Lunch & supper   . sodium chloride (OCEAN) 0.65 % SOLN nasal spray Place 1-2 sprays into the nose 4 (four) times daily as needed for congestion.    No facility-administered encounter medications on file as of 02/25/2018.     Functional Status:  In your present state of health, do you have any difficulty performing the following activities: 02/03/2018 01/31/2018  Hearing? N N  Vision? N N  Difficulty concentrating or making decisions? N N  Walking or climbing stairs? [place]  Dressing or bathing? N N  Doing errands, shopping? N [place] and eating ? N -  Using the Toilet? N -  In the past six months, have you accidently leaked urine? N -  Do you have problems with loss of bowel control? N -  Managing your Medications? N -  Managing your Finances? N -  Housekeeping or managing your Housekeeping? N -  Some recent data might be hidden    Fall/Depression Screening: Fall Risk  02/03/2018 12/29/2017 12/01/2017  Falls in the past year? Yes Yes Yes  Number falls in past yr: - 1 1  Injury with Fall? - - -  Follow up - - -   PHQ 2/9 Scores 02/03/2018 12/29/2017 12/01/2017 08/13/2017 06/29/2017 05/27/2017 05/04/2017  PHQ - 2 Score 0 0 0 0 0  0 0    Assessment: Patient continues to benefit from care manager outreach for disease management and support.    Plan:  [facility] CM Care Plan Problem One     Most Recent Value  Care Plan Problem One  Knowledge Deficit Diabetes  Role Documenting the Problem One  Care Management Telephonic Coordinator  Care Plan for Problem One  Active  THN Long Term Goal   Patient will report A1c 5.5 or less within 90 days.  THN Long Term Goal Start Date  02/25/18  Interventions for Problem One Long Term Goal  Patient continues to monitor blood sugar and watch her carbohydrates    [facility] CM Care Plan Problem Two     Most Recent Value  Care Plan Problem Two  Chronic Back Pain-recent hospitalization for surgery  Role Documenting the Problem Two  Care Management Telephonic Coordinator  Care Plan for Problem Two  Active  Interventions for Problem Two Long Term Goal   Patient continues precaution of no bending, lifting or twisting.  Patient knows to call surgeon for back problems.   THN Long Term Goal  Patient will not readmit to the hospital within 31 days.  THN Long Term Goal Start Date  02/03/18  [facility] CM Short Term Goal #1   Patient will report pain managed better after surgery within 14 days  THN CM Short Term Goal #1 Start Date  02/03/18  [facility] CM Short Term Goal #1 Met Date   02/09/18  THN CM Short Term Goal #2   Patient will report increase in activity and exercise as ordered by physician within 30 days.  THN CM Short Term Goal #2 Start Date  02/25/18  Interventions for Short Term Goal #2  RN CM discussed exercises that do not require and bending or twisting, and reiterated importance of walking frequently.     RN CM will contact patient in the month of May and patient agrees to next outreach.    Jone Baseman, RN, MSN [facility] Care Management Care Management Coordinator Direct Line [phone] Toll Free: [phone]  Fax: [phone]

## 2018-03-01 DIAGNOSIS — I10 Essential (primary) hypertension: Secondary | ICD-10-CM | POA: Diagnosis not present

## 2018-03-01 DIAGNOSIS — M1990 Unspecified osteoarthritis, unspecified site: Secondary | ICD-10-CM | POA: Diagnosis not present

## 2018-03-01 DIAGNOSIS — M48061 Spinal stenosis, lumbar region without neurogenic claudication: Secondary | ICD-10-CM | POA: Diagnosis not present

## 2018-03-01 DIAGNOSIS — E119 Type 2 diabetes mellitus without complications: Secondary | ICD-10-CM | POA: Diagnosis not present

## 2018-03-01 DIAGNOSIS — K219 Gastro-esophageal reflux disease without esophagitis: Secondary | ICD-10-CM | POA: Diagnosis not present

## 2018-03-01 DIAGNOSIS — J45909 Unspecified asthma, uncomplicated: Secondary | ICD-10-CM | POA: Diagnosis not present

## 2018-03-01 DIAGNOSIS — E785 Hyperlipidemia, unspecified: Secondary | ICD-10-CM | POA: Diagnosis not present

## 2018-03-01 DIAGNOSIS — Z4789 Encounter for other orthopedic aftercare: Secondary | ICD-10-CM | POA: Diagnosis not present

## 2018-03-01 DIAGNOSIS — M5410 Radiculopathy, site unspecified: Secondary | ICD-10-CM | POA: Diagnosis not present

## 2018-03-07 ENCOUNTER — Encounter: Payer: Self-pay | Admitting: Podiatry

## 2018-03-07 ENCOUNTER — Ambulatory Visit: Payer: Medicare HMO | Admitting: Podiatry

## 2018-03-07 DIAGNOSIS — M79675 Pain in left toe(s): Secondary | ICD-10-CM | POA: Diagnosis not present

## 2018-03-07 DIAGNOSIS — M79674 Pain in right toe(s): Secondary | ICD-10-CM | POA: Diagnosis not present

## 2018-03-07 DIAGNOSIS — B351 Tinea unguium: Secondary | ICD-10-CM

## 2018-03-09 DIAGNOSIS — E119 Type 2 diabetes mellitus without complications: Secondary | ICD-10-CM | POA: Diagnosis not present

## 2018-03-09 DIAGNOSIS — M1990 Unspecified osteoarthritis, unspecified site: Secondary | ICD-10-CM | POA: Diagnosis not present

## 2018-03-09 DIAGNOSIS — I10 Essential (primary) hypertension: Secondary | ICD-10-CM | POA: Diagnosis not present

## 2018-03-09 DIAGNOSIS — K219 Gastro-esophageal reflux disease without esophagitis: Secondary | ICD-10-CM | POA: Diagnosis not present

## 2018-03-09 DIAGNOSIS — M48061 Spinal stenosis, lumbar region without neurogenic claudication: Secondary | ICD-10-CM | POA: Diagnosis not present

## 2018-03-09 DIAGNOSIS — Z4789 Encounter for other orthopedic aftercare: Secondary | ICD-10-CM | POA: Diagnosis not present

## 2018-03-09 DIAGNOSIS — J45909 Unspecified asthma, uncomplicated: Secondary | ICD-10-CM | POA: Diagnosis not present

## 2018-03-09 DIAGNOSIS — M5410 Radiculopathy, site unspecified: Secondary | ICD-10-CM | POA: Diagnosis not present

## 2018-03-09 DIAGNOSIS — E785 Hyperlipidemia, unspecified: Secondary | ICD-10-CM | POA: Diagnosis not present

## 2018-03-13 NOTE — Progress Notes (Signed)
Subjective:   Patient ID: [name], female   DOB: 72 y.o.   MRN: [id number]   HPI Patient presents with thickened yellow brittle nailbeds 1-5 both feet that are painful   ROS      Objective:  Physical Exam  Neurovascular status intact with thick yellow brittle nailbeds 1-5 both feet     Assessment:  Chronic mycotic nail infection 1-5 both feet with thick nails that are painful when palpated     Plan:  Debride painful nailbeds 1-5 both feet with no iatrogenic bleeding noted

## 2018-03-17 DIAGNOSIS — M5416 Radiculopathy, lumbar region: Secondary | ICD-10-CM | POA: Diagnosis not present

## 2018-03-17 DIAGNOSIS — M25561 Pain in right knee: Secondary | ICD-10-CM | POA: Diagnosis not present

## 2018-03-17 DIAGNOSIS — Z79899 Other long term (current) drug therapy: Secondary | ICD-10-CM | POA: Diagnosis not present

## 2018-03-17 DIAGNOSIS — M1990 Unspecified osteoarthritis, unspecified site: Secondary | ICD-10-CM | POA: Diagnosis not present

## 2018-03-17 DIAGNOSIS — M109 Gout, unspecified: Secondary | ICD-10-CM | POA: Diagnosis not present

## 2018-03-17 DIAGNOSIS — M0609 Rheumatoid arthritis without rheumatoid factor, multiple sites: Secondary | ICD-10-CM | POA: Diagnosis not present

## 2018-03-17 DIAGNOSIS — M7070 Other bursitis of hip, unspecified hip: Secondary | ICD-10-CM | POA: Diagnosis not present

## 2018-03-17 DIAGNOSIS — M25461 Effusion, right knee: Secondary | ICD-10-CM | POA: Diagnosis not present

## 2018-03-24 ENCOUNTER — Other Ambulatory Visit: Payer: Self-pay

## 2018-03-24 NOTE — Patient Outreach (Signed)
[place] [facility]) Care Management  [place]  03/24/2018   ANTONIETTA LANSDOWNE 1946-10-25 [id number]  Subjective: Telephone call to patient for monthly call.  Patient reports that she is doing great some morning stiffness reported at times.  She states that she is gong to the pool for exercise. Patient does report however that she did have to get a shot in her right knee and hip due to bursitis.  She reports that her weight is down to 145 lbs.  Patient reports that she is slowly incorporating more activity in her day but careful not do too much.  Patient maintains bending precautions. Patient reports that her blood sugars remain good. Patient voices no questions or concerns.    Objective:   Encounter Medications:  Outpatient Encounter Medications as of 03/24/2018  Medication Sig Note  . aspirin EC 81 MG tablet Take 81 mg by mouth daily. 01/24/2018: On hold due to upcoming procedure.  . bumetanide (BUMEX) 0.5 MG tablet Take 0.5 mg by mouth 2 (two) times daily.     . cetirizine (ZYRTEC) 10 MG tablet Take 10 mg by mouth daily as needed for allergies (during Spring/Summer).   . cholecalciferol (VITAMIN D) 1000 units tablet Take 1,000 Units by mouth daily.   . cyclobenzaprine (FLEXERIL) 10 MG tablet Take 1 tablet (10 mg total) by mouth 3 (three) times daily as needed for muscle spasms.   . diclofenac (VOLTAREN) 75 MG EC tablet Take 75 mg by mouth 2 (two) times daily.   Marland Kitchen diltiazem (CARDIZEM CD) 120 MG 24 hr capsule Take 120 mg by mouth daily before breakfast.    . docusate sodium (COLACE) 100 MG capsule Take 1 capsule (100 mg total) by mouth 2 (two) times daily.   . enalapril (VASOTEC) 20 MG tablet Take 20 mg by mouth at bedtime.    . folic acid (FOLVITE) 878 MCG tablet Take 800 mcg by mouth daily.   Marland Kitchen gabapentin (NEURONTIN) 300 MG capsule Take 300 mg by mouth 2 (two) times daily.    Marland Kitchen lovastatin (MEVACOR) 40 MG tablet Take 40 mg by mouth at bedtime.    . Magnesium 300 MG CAPS Take  300 mg by mouth every other day.   . metFORMIN (GLUCOPHAGE-XR) 500 MG 24 hr tablet Take 500-1,000 mg by mouth 2 (two) times daily. Take 1 tablet (500 mg) in the morning and take 2 tablets (1000 mg) with supper   . methotrexate (RHEUMATREX) 2.5 MG tablet Take 12.5 mg by mouth every Sunday at [place]. Protect from light.    . Multiple Vitamin (MULTIVITAMIN WITH MINERALS) TABS tablet Take 1 tablet by mouth daily.   Marland Kitchen oxyCODONE (OXY IR/ROXICODONE) 5 MG immediate release tablet Take 1 tablet (5 mg total) by mouth every 4 (four) hours as needed for moderate pain ((score 4 to 6)).   Marland Kitchen ranitidine (ZANTAC) 300 MG tablet Take 300 mg by mouth 2 (two) times daily. Lunch & supper   . sodium chloride (OCEAN) 0.65 % SOLN nasal spray Place 1-2 sprays into the nose 4 (four) times daily as needed for congestion.    No facility-administered encounter medications on file as of 03/24/2018.     Functional Status:  In your present state of health, do you have any difficulty performing the following activities: 02/03/2018 01/31/2018  Hearing? N N  Vision? N N  Difficulty concentrating or making decisions? N N  Walking or climbing stairs? Y Y  Dressing or bathing? N N  Doing errands, shopping?  N [place] and eating ? N -  Using the Toilet? N -  In the past six months, have you accidently leaked urine? N -  Do you have problems with loss of bowel control? N -  Managing your Medications? N -  Managing your Finances? N -  Housekeeping or managing your Housekeeping? N -  Some recent data might be hidden    Fall/Depression Screening: Fall Risk  02/03/2018 12/29/2017 12/01/2017  Falls in the past year? Yes Yes Yes  Number falls in past yr: - 1 1  Injury with Fall? - - -  Follow up - - -   PHQ 2/9 Scores 02/03/2018 12/29/2017 12/01/2017 08/13/2017 06/29/2017 05/27/2017 05/04/2017  PHQ - 2 Score 0 0 0 0 0 0 0    Assessment: Patient continues to benefit from care manager outreach for  disease management and support.    Plan:  [facility] CM Care Plan Problem One     Most Recent Value  Care Plan Problem One  Knowledge Deficit Diabetes  Role Documenting the Problem One  Care Management Telephonic Coordinator  Care Plan for Problem One  Active  THN Long Term Goal   Patient will report A1c 5.5 or less within 90 days.  THN Long Term Goal Start Date  03/24/18  Interventions for Problem One Long Term Goal  Patient reports that sugars are ranging from 90-120.      [facility] CM Care Plan Problem Two     Most Recent Value  Care Plan Problem Two  Chronic Back Pain-recent hospitalization for surgery  Role Documenting the Problem Two  Care Management Telephonic Coordinator  Care Plan for Problem Two  Active  THN Long Term Goal  Patient will not readmit to the hospital within 31 days.  THN Long Term Goal Start Date  02/03/18  THN Long Term Goal Met Date  03/24/18  [facility] CM Short Term Goal #2   Patient will report increase in activity and exercise as ordered by physician within 30 days.  THN CM Short Term Goal #2 Start Date  03/24/18  Interventions for Short Term Goal #2  Patient continues to exercise. Patient going to the pool and walking more.       RN CM will contact patient in the month of June and patient agrees to next outreach.  Jone Baseman, RN, MSN [facility] Care Management Care Management Coordinator Direct Line [phone] Toll Free: [phone]  Fax: [phone]

## 2018-04-19 DIAGNOSIS — M1712 Unilateral primary osteoarthritis, left knee: Secondary | ICD-10-CM | POA: Diagnosis not present

## 2018-04-19 DIAGNOSIS — M25551 Pain in right hip: Secondary | ICD-10-CM | POA: Diagnosis not present

## 2018-04-19 DIAGNOSIS — M25461 Effusion, right knee: Secondary | ICD-10-CM | POA: Diagnosis not present

## 2018-04-19 DIAGNOSIS — Z79899 Other long term (current) drug therapy: Secondary | ICD-10-CM | POA: Diagnosis not present

## 2018-04-19 DIAGNOSIS — M25561 Pain in right knee: Secondary | ICD-10-CM | POA: Diagnosis not present

## 2018-04-19 DIAGNOSIS — M1711 Unilateral primary osteoarthritis, right knee: Secondary | ICD-10-CM | POA: Diagnosis not present

## 2018-04-19 DIAGNOSIS — M1990 Unspecified osteoarthritis, unspecified site: Secondary | ICD-10-CM | POA: Diagnosis not present

## 2018-04-19 DIAGNOSIS — M0609 Rheumatoid arthritis without rheumatoid factor, multiple sites: Secondary | ICD-10-CM | POA: Diagnosis not present

## 2018-04-19 DIAGNOSIS — M109 Gout, unspecified: Secondary | ICD-10-CM | POA: Diagnosis not present

## 2018-04-19 DIAGNOSIS — M5416 Radiculopathy, lumbar region: Secondary | ICD-10-CM | POA: Diagnosis not present

## 2018-04-25 DIAGNOSIS — M25551 Pain in right hip: Secondary | ICD-10-CM | POA: Diagnosis not present

## 2018-04-25 DIAGNOSIS — M0609 Rheumatoid arthritis without rheumatoid factor, multiple sites: Secondary | ICD-10-CM | POA: Diagnosis not present

## 2018-04-25 DIAGNOSIS — M109 Gout, unspecified: Secondary | ICD-10-CM | POA: Diagnosis not present

## 2018-04-25 DIAGNOSIS — M5416 Radiculopathy, lumbar region: Secondary | ICD-10-CM | POA: Diagnosis not present

## 2018-04-25 DIAGNOSIS — M1990 Unspecified osteoarthritis, unspecified site: Secondary | ICD-10-CM | POA: Diagnosis not present

## 2018-04-25 DIAGNOSIS — Z79899 Other long term (current) drug therapy: Secondary | ICD-10-CM | POA: Diagnosis not present

## 2018-04-25 DIAGNOSIS — M25561 Pain in right knee: Secondary | ICD-10-CM | POA: Diagnosis not present

## 2018-04-28 ENCOUNTER — Other Ambulatory Visit: Payer: Self-pay

## 2018-04-28 NOTE — Patient Outreach (Signed)
[place] [facility]) Care Management  [place]  04/28/2018   [name] [birth year] [id number]  Subjective: Telephone call to patient for monthly call.  Patient reports she is doing ok except for some pain in her hip and knees from arthritis.  Patient reports that she will get an injection in her right hip next week to help with the pain.  Patient reports she is only taking tylenol occasionally and tries to keep active in order to keep from getting stiff.  Patient reports that her back has not given her any trouble.  Patient reports that her sugars have been less than 115.  Encouraged patient to continue to monitor her sugars and watch her diet. She verbalized understanding and voices no concerns.    Objective:   Encounter Medications:  Outpatient Encounter Medications as of 04/28/2018  Medication Sig  . aspirin EC 81 MG tablet Take 81 mg by mouth daily.  . bumetanide (BUMEX) 0.5 MG tablet Take 0.5 mg by mouth 2 (two) times daily.    . cetirizine (ZYRTEC) 10 MG tablet Take 10 mg by mouth daily as needed for allergies (during Spring/Summer).  . cholecalciferol (VITAMIN D) 1000 units tablet Take 1,000 Units by mouth daily.  . cyclobenzaprine (FLEXERIL) 10 MG tablet Take 1 tablet (10 mg total) by mouth 3 (three) times daily as needed for muscle spasms.  . diclofenac (VOLTAREN) 75 MG EC tablet Take 75 mg by mouth 2 (two) times daily.  Marland Kitchen diltiazem (CARDIZEM CD) 120 MG 24 hr capsule Take 120 mg by mouth daily before breakfast.   . docusate sodium (COLACE) 100 MG capsule Take 1 capsule (100 mg total) by mouth 2 (two) times daily.  . enalapril (VASOTEC) 20 MG tablet Take 20 mg by mouth at bedtime.   . folic acid (FOLVITE) 476 MCG tablet Take 800 mcg by mouth daily.  Marland Kitchen gabapentin (NEURONTIN) 300 MG capsule Take 300 mg by mouth 2 (two) times daily.   Marland Kitchen lovastatin (MEVACOR) 40 MG tablet Take 40 mg by mouth at bedtime.   . Magnesium 300 MG CAPS Take 300 mg by mouth every other day.   . metFORMIN (GLUCOPHAGE-XR) 500 MG 24 hr tablet Take 500-1,000 mg by mouth 2 (two) times daily. Take 1 tablet (500 mg) in the morning and take 2 tablets (1000 mg) with supper  . methotrexate (RHEUMATREX) 2.5 MG tablet Take 12.5 mg by mouth every Sunday at [place]. Protect from light.   . Multiple Vitamin (MULTIVITAMIN WITH MINERALS) TABS tablet Take 1 tablet by mouth daily.  Marland Kitchen oxyCODONE (OXY IR/ROXICODONE) 5 MG immediate release tablet Take 1 tablet (5 mg total) by mouth every 4 (four) hours as needed for moderate pain ((score 4 to 6)).  Marland Kitchen ranitidine (ZANTAC) 300 MG tablet Take 300 mg by mouth 2 (two) times daily. Lunch & supper  . sodium chloride (OCEAN) 0.65 % SOLN nasal spray Place 1-2 sprays into the nose 4 (four) times daily as needed for congestion.   No facility-administered encounter medications on file as of 04/28/2018.     Functional Status:  In your present state of health, do you have any difficulty performing the following activities: 02/03/2018 01/31/2018  Hearing? N N  Vision? N N  Difficulty concentrating or making decisions? N N  Walking or climbing stairs? Y Y  Dressing or bathing? N N  Doing errands, shopping? N [place] and eating ? N -  Using the Toilet? N -  In the past six months, have you accidently leaked urine? N -  Do you have problems with loss of bowel control? N -  Managing your Medications? N -  Managing your Finances? N -  Housekeeping or managing your Housekeeping? N -  Some recent data might be hidden    Fall/Depression Screening: Fall Risk  02/03/2018 12/29/2017 12/01/2017  Falls in the past year? Yes Yes Yes  Number falls in past yr: - 1 1  Injury with Fall? - - -  Follow up - - -   PHQ 2/9 Scores 02/03/2018 12/29/2017 12/01/2017 08/13/2017 06/29/2017 05/27/2017 05/04/2017  PHQ - 2 Score 0 0 0 0 0 0 0    Assessment: Patient continues to benefit from care manager outreach for disease management and support.    Plan:   [facility] CM Care Plan Problem One     Most Recent Value  Care Plan Problem One  Knowledge Deficit Diabetes  Role Documenting the Problem One  Care Management Telephonic Coordinator  Care Plan for Problem One  Active  THN Long Term Goal   Patient will report A1c 5.5 or less within 90 days.  THN Long Term Goal Start Date  04/28/18  Interventions for Problem One Long Term Goal  Patient taking blood sugars daily and watching diet.  RN CM reiterated importance of diet.      [facility] CM Care Plan Problem Two     Most Recent Value  Care Plan Problem Two  Chronic Back Pain-recent hospitalization for surgery  Role Documenting the Problem Two  Care Management Telephonic Coordinator  [facility] CM Short Term Goal #2   Patient will report increase in activity and exercise as ordered by physician within 30 days.  THN CM Short Term Goal #2 Start Date  03/24/18  [facility] CM Short Term Goal #2 Met Date  04/28/18  Interventions for Short Term Goal #2  Patient continues with exercises and going to the gym.       RN CM will contact patient in the month of July and patient agrees to next outreach.  Jone Baseman, RN, MSN [facility] Care Management Care Management Coordinator Direct Line [phone] Toll Free: [phone]  Fax: [phone]

## 2018-05-03 DIAGNOSIS — M25562 Pain in left knee: Secondary | ICD-10-CM | POA: Diagnosis not present

## 2018-05-03 DIAGNOSIS — M160 Bilateral primary osteoarthritis of hip: Secondary | ICD-10-CM | POA: Diagnosis not present

## 2018-05-03 DIAGNOSIS — M25561 Pain in right knee: Secondary | ICD-10-CM | POA: Diagnosis not present

## 2018-05-05 ENCOUNTER — Other Ambulatory Visit: Payer: Self-pay | Admitting: Orthopedic Surgery

## 2018-05-05 DIAGNOSIS — M1611 Unilateral primary osteoarthritis, right hip: Secondary | ICD-10-CM | POA: Diagnosis not present

## 2018-05-05 DIAGNOSIS — M1612 Unilateral primary osteoarthritis, left hip: Secondary | ICD-10-CM | POA: Diagnosis not present

## 2018-05-06 NOTE — Pre-Procedure Instructions (Signed)
[name]  05/06/2018      [facility] [name] [place] [address] Phone: [phone] Fax: [phone]  [facility] [place] [name] [address] Phone: [phone] Fax: [phone]    Your procedure is scheduled on Wednesday June 26.  Report to [facility] Admitting at 10:45 A.M.  Call this number if you have problems the morning of surgery:  [phone]   Remember:  Do not eat or drink after midnight.      Take these medicines the morning of surgery with A SIP OF WATER:   Diltiazem (Cardizem) Gabapentin (neurontin) Ranitidine (zantac) if needed [place] Nasal spray if needed Cetirizine (zyrtec) if needed Oxycodone if needed  DO NOT take Metformin (Glucophage) the day of surgery  7 days prior to surgery STOP taking any diclofenac (voltaren), Aleve, Naproxen, Ibuprofen, Motrin, Advil, Goody's, BC's, all herbal medications, fish oil, and all vitamins  **Follow your surgeon's instructions on stopping Aspirin. If no instructions were given, please call your surgeon's office**     How to Manage Your Diabetes Before and After Surgery  Why is it important to control my blood sugar before and after surgery? . Improving blood sugar levels before and after surgery helps healing and can limit problems. . A way of improving blood sugar control is eating a healthy diet by: o  Eating less sugar and carbohydrates o  Increasing activity/exercise o  Talking with your doctor about reaching your blood sugar goals . High blood sugars (greater than 180 mg/dL) can raise your risk of infections and slow your recovery, so you will need to focus on controlling your diabetes during the weeks before surgery. . Make sure that the doctor who takes care of your diabetes knows about your planned  surgery including the date and location.  How do I manage my blood sugar before surgery? . Check your blood sugar at least 4 times a day, starting 2 days before surgery, to make sure that the level is not too high or low. o Check your blood sugar the morning of your surgery when you wake up and every 2 hours until you get to the Short Stay unit. . If your blood sugar is less than 70 mg/dL, you will need to treat for low blood sugar: o Do not take insulin. o Treat a low blood sugar (less than 70 mg/dL) with  cup of clear juice (cranberry or apple), 4 glucose tablets, OR glucose gel. Recheck blood sugar in 15 minutes after treatment (to make sure it is greater than 70 mg/dL). If your blood sugar is not greater than 70 mg/dL on recheck, call [phone] o  for further instructions. . Report your blood sugar to the short stay nurse when you get to Short Stay.  . If you are admitted to the hospital after surgery: o Your blood sugar will be checked by the staff and you will probably be given insulin after surgery (instead of oral diabetes medicines) to make sure you have good blood sugar levels. o The goal for blood sugar control after surgery is 80-180 mg/dL.              Do not wear jewelry, make-up or nail polish.  Do not wear lotions, powders, or perfumes, or deodorant.  Do not shave  48 hours prior to surgery.  Men may shave face and neck.  Do not bring valuables to the hospital.  [facility] is not responsible for any belongings or valuables.  Contacts, dentures or bridgework may not be worn into surgery.  Leave your suitcase in the car.  After surgery it may be brought to your room.  For patients admitted to the hospital, discharge time will be determined by your treatment team.  Patients discharged the day of surgery will not be allowed to drive home.   Special instructions:    [place]- Preparing For Surgery  Before surgery, you can play an important role. Because  skin is not sterile, your skin needs to be as free of germs as possible. You can reduce the number of germs on your skin by washing with CHG (chlorahexidine gluconate) Soap before surgery.  CHG is an antiseptic cleaner which kills germs and bonds with the skin to continue killing germs even after washing.    Oral Hygiene is also important to reduce your risk of infection.  Remember - BRUSH YOUR TEETH THE MORNING OF SURGERY WITH YOUR REGULAR TOOTHPASTE  Please do not use if you have an allergy to CHG or antibacterial soaps. If your skin becomes reddened/irritated stop using the CHG.  Do not shave (including legs and underarms) for at least 48 hours prior to first CHG shower. It is OK to shave your face.  Please follow these instructions carefully.   1. Shower the NIGHT BEFORE SURGERY and the MORNING OF SURGERY with CHG.   2. If you chose to wash your hair, wash your hair first as usual with your normal shampoo.  3. After you shampoo, rinse your hair and body thoroughly to remove the shampoo.  4. Use CHG as you would any other liquid soap. You can apply CHG directly to the skin and wash gently with a scrungie or a clean washcloth.   5. Apply the CHG Soap to your body ONLY FROM THE NECK DOWN.  Do not use on open wounds or open sores. Avoid contact with your eyes, ears, mouth and genitals (private parts). Wash Face and genitals (private parts)  with your normal soap.  6. Wash thoroughly, paying special attention to the area where your surgery will be performed.  7. Thoroughly rinse your body with warm water from the neck down.  8. DO NOT shower/wash with your normal soap after using and rinsing off the CHG Soap.  9. Pat yourself Carpenter with a CLEAN TOWEL.  10. Wear CLEAN PAJAMAS to bed the night before surgery, wear comfortable clothes the morning of surgery  11. Place CLEAN SHEETS on your bed the night of your first shower and DO NOT SLEEP WITH PETS.    Day of Surgery:  Do not apply any  deodorants/lotions.  Please wear clean clothes to the hospital/surgery center.   Remember to brush your teeth WITH YOUR REGULAR TOOTHPASTE.    Please read over the following fact sheets that you were given. Coughing and Deep Breathing, Total Joint Packet, MRSA Information and Surgical Site Infection Prevention

## 2018-05-09 ENCOUNTER — Encounter (HOSPITAL_COMMUNITY): Payer: Self-pay

## 2018-05-09 ENCOUNTER — Ambulatory Visit (HOSPITAL_COMMUNITY)
Admission: RE | Admit: 2018-05-09 | Discharge: 2018-05-09 | Disposition: A | Discharge status: Home / Self Care | Payer: Medicare HMO | Source: Ambulatory Visit | Attending: Orthopedic Surgery | Admitting: Orthopedic Surgery

## 2018-05-09 ENCOUNTER — Other Ambulatory Visit: Payer: Self-pay

## 2018-05-09 ENCOUNTER — Encounter (HOSPITAL_COMMUNITY)
Admission: RE | Admit: 2018-05-09 | Discharge: 2018-05-09 | Disposition: A | Discharge status: Home / Self Care | Payer: Medicare HMO | Source: Ambulatory Visit | Attending: Orthopedic Surgery | Admitting: Orthopedic Surgery

## 2018-05-09 DIAGNOSIS — Z01818 Encounter for other preprocedural examination: Secondary | ICD-10-CM | POA: Diagnosis not present

## 2018-05-09 DIAGNOSIS — I10 Essential (primary) hypertension: Secondary | ICD-10-CM | POA: Diagnosis not present

## 2018-05-09 DIAGNOSIS — E119 Type 2 diabetes mellitus without complications: Secondary | ICD-10-CM | POA: Insufficient documentation

## 2018-05-09 DIAGNOSIS — Z79899 Other long term (current) drug therapy: Secondary | ICD-10-CM | POA: Insufficient documentation

## 2018-05-09 DIAGNOSIS — I700 Atherosclerosis of aorta: Secondary | ICD-10-CM | POA: Insufficient documentation

## 2018-05-09 DIAGNOSIS — K219 Gastro-esophageal reflux disease without esophagitis: Secondary | ICD-10-CM | POA: Insufficient documentation

## 2018-05-09 DIAGNOSIS — Z7982 Long term (current) use of aspirin: Secondary | ICD-10-CM | POA: Diagnosis not present

## 2018-05-09 DIAGNOSIS — E785 Hyperlipidemia, unspecified: Secondary | ICD-10-CM | POA: Diagnosis not present

## 2018-05-09 DIAGNOSIS — Z7984 Long term (current) use of oral hypoglycemic drugs: Secondary | ICD-10-CM | POA: Insufficient documentation

## 2018-05-09 LAB — GLUCOSE, CAPILLARY: GLUCOSE-CAPILLARY: 134 mg/dL — AB (ref 65–99)

## 2018-05-09 LAB — CBC WITH DIFFERENTIAL/PLATELET
Abs Immature Granulocytes: 0.1 10*3/uL (ref 0.0–0.1)
BASOS PCT: 1 %
Basophils Absolute: 0.1 10*3/uL (ref 0.0–0.1)
EOS ABS: 0.2 10*3/uL (ref 0.0–0.7)
EOS PCT: 2 %
HCT: 36.4 % (ref 36.0–46.0)
Hemoglobin: 11.5 g/dL — ABNORMAL LOW (ref 12.0–15.0)
Immature Granulocytes: 1 %
Lymphocytes Relative: 27 %
Lymphs Abs: 2.6 10*3/uL (ref 0.7–4.0)
MCH: 26.5 pg (ref 26.0–34.0)
MCHC: 31.6 g/dL (ref 30.0–36.0)
MCV: 83.9 fL (ref 78.0–100.0)
Monocytes Absolute: 0.6 10*3/uL (ref 0.1–1.0)
Monocytes Relative: 6 %
Neutro Abs: 6.3 10*3/uL (ref 1.7–7.7)
Neutrophils Relative %: 65 %
PLATELETS: 472 10*3/uL — AB (ref 150–400)
RBC: 4.34 MIL/uL (ref 3.87–5.11)
RDW: 20.2 % — AB (ref 11.5–15.5)
WBC: 9.7 10*3/uL (ref 4.0–10.5)

## 2018-05-09 LAB — URINALYSIS, ROUTINE W REFLEX MICROSCOPIC
GLUCOSE, UA: NEGATIVE mg/dL
HGB URINE DIPSTICK: NEGATIVE
KETONES UR: 5 mg/dL — AB
LEUKOCYTES UA: NEGATIVE
Nitrite: NEGATIVE
PH: 5 (ref 5.0–8.0)
PROTEIN: 30 mg/dL — AB
Specific Gravity, Urine: 1.045 — ABNORMAL HIGH (ref 1.005–1.030)

## 2018-05-09 LAB — BASIC METABOLIC PANEL
Anion gap: 7 (ref 5–15)
BUN: 18 mg/dL (ref 6–20)
CALCIUM: 9.6 mg/dL (ref 8.9–10.3)
CO2: 30 mmol/L (ref 22–32)
CREATININE: 0.77 mg/dL (ref 0.44–1.00)
Chloride: 103 mmol/L (ref 101–111)
GFR calc non Af Amer: >60 mL/min (ref >60)
Glucose, Bld: 100 mg/dL — ABNORMAL HIGH (ref 65–99)
Potassium: 3.8 mmol/L (ref 3.5–5.1)
SODIUM: 140 mmol/L (ref 135–145)

## 2018-05-09 LAB — SURGICAL PCR SCREEN
MRSA, PCR: NEGATIVE
STAPHYLOCOCCUS AUREUS: POSITIVE — AB

## 2018-05-09 LAB — TYPE AND SCREEN
ABO/RH(D): AB NEG
Antibody Screen: NEGATIVE

## 2018-05-09 LAB — APTT: APTT: 26 s (ref 24–36)

## 2018-05-09 LAB — HEMOGLOBIN A1C
Hgb A1c MFr Bld: 5.8 % — ABNORMAL HIGH (ref 4.8–5.6)
Mean Plasma Glucose: 119.76 mg/dL

## 2018-05-09 LAB — PROTIME-INR
INR: 0.88
Prothrombin Time: 11.9 seconds (ref 11.4–15.2)

## 2018-05-09 NOTE — Progress Notes (Signed)
Spoke with [name] at Dr. Damita Dunnings office regarding pt's abnormal UA. Juliann Pulse will notify Dr. Mayer Camel for further orders.  Jacqlyn Larsen, RN

## 2018-05-09 NOTE — Progress Notes (Addendum)
PCP - Dr. Jani Gravel Cardiologist - patient denies  Chest x-ray - 05/09/2018 EKG - 01/28/2018 Stress Test - 20+ years ago  ECHO - 10/24/2003 Cardiac Cath - 20+ years ago  Sleep Study - patient reports a negative sleep study in the past.   Fasting Blood Sugar -134   Blood Thinner Instructions: Pt reports taking ASA occasionally. Last dose 05/06/2018. Aspirin Instructions: Stop taking ASA 7 days prior to surgery.   Anesthesia review: No, see note from Willeen Cass, NP on 01/28/18 from patients previous surgery.   Patient denies shortness of breath, fever, cough and chest pain at PAT appointment   Patient verbalized understanding of instructions that were given to them at the PAT appointment. Patient was also instructed that they will need to review over the PAT instructions again at home before surgery.  Jacqlyn Larsen, RN

## 2018-05-09 NOTE — Progress Notes (Signed)
Pt's surgical PCR positive for MSSA. Mupiricon 2% ointment called into [facility] on [address]. [place], [place] [phone]. Patient notified at [phone], verbally agreed to pick up prescription and begin treatment as soon as possible. All questions and concerns addressed.   Jacqlyn Larsen, RN

## 2018-05-17 ENCOUNTER — Other Ambulatory Visit: Payer: Self-pay | Admitting: Orthopedic Surgery

## 2018-05-17 DIAGNOSIS — M1611 Unilateral primary osteoarthritis, right hip: Secondary | ICD-10-CM | POA: Diagnosis present

## 2018-05-17 MED ORDER — BUPIVACAINE LIPOSOME 1.3 % IJ SUSP
20.0000 mL | INTRAMUSCULAR | Status: AC
  Administered 2018-05-18: 20 mL
  Filled 2018-05-17: qty 20

## 2018-05-17 MED ORDER — TRANEXAMIC ACID 1000 MG/10ML IV SOLN
2000.0000 mg | INTRAVENOUS | Status: AC
  Administered 2018-05-18: 2000 mg via TOPICAL
  Filled 2018-05-17: qty 20

## 2018-05-17 MED ORDER — LACTATED RINGERS IV SOLN
INTRAVENOUS | Status: DC
  Administered 2018-05-18: 11:00:00 via INTRAVENOUS

## 2018-05-17 MED ORDER — TRANEXAMIC ACID 1000 MG/10ML IV SOLN
1000.0000 mg | INTRAVENOUS | Status: AC
  Administered 2018-05-18: 1000 mg via INTRAVENOUS
  Filled 2018-05-17: qty 1100

## 2018-05-17 MED ORDER — CEFAZOLIN SODIUM-DEXTROSE 2-4 GM/100ML-% IV SOLN
2.0000 g | INTRAVENOUS | Status: AC
  Administered 2018-05-18: 2 g via INTRAVENOUS
  Filled 2018-05-17: qty 100

## 2018-05-17 NOTE — H&P (Signed)
TOTAL HIP ADMISSION H&P  Patient is admitted for right total hip arthroplasty.  Subjective:  Chief Complaint: right hip pain  HPI: [name], 72 y.o. female, has a history of pain and functional disability in the right hip(s) due to arthritis and patient has failed non-surgical conservative treatments for greater than 12 weeks to include NSAID's and/or analgesics, corticosteriod injections, use of assistive devices, weight reduction as appropriate and activity modification.  Onset of symptoms was gradual starting 2 years ago with gradually worsening course since that time.The patient noted no past surgery on the right hip(s).  Patient currently rates pain in the right hip at 10 out of 10 with activity. Patient has night pain, worsening of pain with activity and weight bearing, pain that interfers with activities of daily living, pain with passive range of motion and crepitus. Patient has evidence of subchondral cysts, subchondral sclerosis, periarticular osteophytes, joint subluxation and joint space narrowing by imaging studies. This condition presents safety issues increasing the risk of falls.  There is no current active infection.  Patient Active Problem List   Diagnosis Date Noted  . Lumbar stenosis with neurogenic claudication 01/31/2018  . Elevated LFTs 12/01/2017  . HTN (hypertension) 09/03/2016  . Diabetes ([place]) 09/03/2016  . Radiculopathy 12/11/2015   Past Medical History:  Diagnosis Date  . Arthritis   . Asthma   . Back pain   . Diabetes mellitus    type 2  . Dyspnea    with asthma attacks   . Dysrhythmia   . Family history of adverse reaction to anesthesia    my first cousin had difficulty waking up   . Fibroid    ovarian  . GERD (gastroesophageal reflux disease)   . History of staph infection    in hospital for 11 days/ in 2007  . Hyperlipidemia   . Hypertension   . Ovarian cyst   . Radiculopathy   . Wears glasses     Past Surgical History:  Procedure  Laterality Date  . ABDOMINAL HYSTERECTOMY  1998   TAH,BSO  . ANTERIOR CERVICAL DECOMP/DISCECTOMY FUSION N/A 12/11/2015   Procedure: ANTERIOR CERVICAL DECOMPRESSION/DISCECTOMY FUSION 2 LEVELS;  Surgeon: Phylliss Bob, MD;  Location: [facility];  Service: Orthopedics;  Laterality: N/A;  Anterior cervical decompression fusion, cervical 6-7, cervical 7-thoracic 1 with instrumentation and allograft  . BACK SURGERY     "NECK DISC" SURGERY  . CARDIAC CATHETERIZATION  1998  . CATARACT EXTRACTION W/ INTRAOCULAR LENS  IMPLANT, BILATERAL  2015   Bil  . COLONOSCOPY    . LAPAROSCOPIC OVARIAN CYSTECTOMY  1970  . LUMBAR LAMINECTOMY/DECOMPRESSION MICRODISCECTOMY N/A 01/31/2018   Procedure: LAMINECTOMY AND FORAMINOTOMY LUMBAR TWO- LUMBAR THREE, LUMBAR THREE- LUMBAR FOUR, LUMBAR FOUR- LUMBAR FIVE ;  Surgeon: Newman Pies, MD;  Location: [facility];  Service: Neurosurgery;  Laterality: N/A;  . ROTATOR CUFF REPAIR  02/2003   right shoulder  . SHOULDER SURGERY  10/2003  . thumb surgery  2010   right thumb  . TONSILLECTOMY    . TUBAL LIGATION      No current facility-administered medications for this encounter.    Current Outpatient Medications  Medication Sig Dispense Refill Last Dose  . albuterol (PROVENTIL HFA;VENTOLIN HFA) 108 (90 Base) MCG/ACT inhaler Inhale 2 puffs into the lungs every 6 (six) hours as needed for wheezing or shortness of breath.     Marland Kitchen aspirin EC 81 MG tablet Take 81 mg by mouth daily.   Taking  . bumetanide (BUMEX) 1 MG tablet  Take 1 mg by mouth 2 (two) times daily.    Taking  . cetirizine (ZYRTEC) 10 MG tablet Take 10 mg by mouth daily as needed for allergies (during Spring/Summer).   Taking  . cholecalciferol (VITAMIN D) 1000 units tablet Take 1,000 Units by mouth daily.   Taking  . diclofenac (VOLTAREN) 75 MG EC tablet Take 75 mg by mouth 2 (two) times daily.   Taking  . diltiazem (CARDIZEM CD) 120 MG 24 hr capsule Take 120 mg by mouth daily before breakfast.    Taking  . enalapril  (VASOTEC) 20 MG tablet Take 20 mg by mouth at bedtime.    Taking  . folic acid (FOLVITE) 564 MCG tablet Take 800 mcg by mouth daily.   Taking  . gabapentin (NEURONTIN) 300 MG capsule Take 300 mg by mouth 3 (three) times daily.    Taking  . lovastatin (MEVACOR) 40 MG tablet Take 40 mg by mouth at bedtime.    Taking  . Magnesium 300 MG CAPS Take 300 mg by mouth daily.    Taking  . metFORMIN (GLUCOPHAGE-XR) 500 MG 24 hr tablet Take 500-1,000 mg by mouth 2 (two) times daily. Take 1 tablet (500 mg) in the morning and take 2 tablets (1000 mg) with supper   Taking  . methotrexate (RHEUMATREX) 2.5 MG tablet Take 12.5 mg by mouth every Sunday at [place]. Protect from light.    Taking  . Multiple Vitamin (MULTIVITAMIN WITH MINERALS) TABS tablet Take 1 tablet by mouth daily.   Taking  . ranitidine (ZANTAC) 300 MG tablet Take 300 mg by mouth 2 (two) times daily. Lunch & supper   Taking  . sodium chloride (OCEAN) 0.65 % SOLN nasal spray Place 1-2 sprays into the nose 4 (four) times daily as needed for congestion.   Taking   Allergies  Allergen Reactions  . Ivp Dye [Iodinated Diagnostic Agents] Nausea And Vomiting  . Prednisone Other (See Comments)    reflux    Social History   Tobacco Use  . Smoking status: Former Smoker    Types: Cigarettes    Last attempt to quit: 11/23/1992    Years since quitting: 25.4  . Smokeless tobacco: Never Used  Substance Use Topics  . Alcohol use: No    Family History  Problem Relation Age of Onset  . Diabetes Sister   . Diabetes Brother   . Diabetes Brother   . Diabetes Paternal Uncle   . Cancer Paternal Aunt        UTERINE  . Hypertension Maternal Grandmother   . Heart disease Maternal Grandmother   . Colon cancer Neg Hx      Review of Systems  Constitutional: Positive for weight loss.  HENT: Positive for sinus pain.   Respiratory: Negative.   Cardiovascular:       HTN  Gastrointestinal: Negative.   Genitourinary: Negative.    Musculoskeletal: Positive for joint pain.  Neurological: Negative.   Endo/Heme/Allergies:       Blood sugar problem  Psychiatric/Behavioral: Negative.     Objective:  Physical Exam  Constitutional: She is oriented to person, place, and time. She appears well-developed and well-nourished.  HENT:  Head: Normocephalic and atraumatic.  Eyes: Pupils are equal, round, and reactive to light.  Neck: Normal range of motion. Neck supple.  Cardiovascular: Intact distal pulses.  Respiratory: Effort normal.  Musculoskeletal: She exhibits tenderness.  the patient has moderate pain with internal rotation of bilateral hips.  Presently she is got decent range  of motion.  Pain with palpation of the groin bilaterally.  Minimal pain laterally.  Her calves are soft and nontender.  Neurological: She is alert and oriented to person, place, and time.  Skin: Skin is warm and dry.  Psychiatric: She has a normal mood and affect. Her behavior is normal. Judgment and thought content normal.    Vital signs in last 24 hours:    Labs:   Estimated body mass index is 31.69 kg/m as calculated from the following:   Height as of 05/09/18: 4' 8.5" (1.435 m).   Weight as of 05/09/18: 65.3 kg (143 lb 14.4 oz).   Imaging Review Plain radiographs demonstrate  end-stage arthritis bilateral hips bone-on-bone with flattening of the femoral head and lateral subluxation of the hips.    Preoperative templating of the joint replacement has been completed, documented, and submitted to the Operating Room personnel in order to optimize intra-operative equipment management.     Assessment/Plan:  End stage arthritis, right hip(s)  The patient history, physical examination, clinical judgement of the provider and imaging studies are consistent with end stage degenerative joint disease of the right hip(s) and total hip arthroplasty is deemed medically necessary. The treatment options including medical management, injection  therapy, arthroscopy and arthroplasty were discussed at length. The risks and benefits of total hip arthroplasty were presented and reviewed. The risks due to aseptic loosening, infection, stiffness, dislocation/subluxation,  thromboembolic complications and other imponderables were discussed.  The patient acknowledged the explanation, agreed to proceed with the plan and consent was signed. Patient is being admitted for inpatient treatment for surgery, pain control, PT, OT, prophylactic antibiotics, VTE prophylaxis, progressive ambulation and ADL's and discharge planning.The patient is planning to be discharged home with home health services

## 2018-05-18 ENCOUNTER — Inpatient Hospital Stay (HOSPITAL_COMMUNITY): Payer: Medicare HMO

## 2018-05-18 ENCOUNTER — Inpatient Hospital Stay (HOSPITAL_COMMUNITY)
Admission: RE | Admit: 2018-05-18 | Discharge: 2018-05-20 | DRG: 470 | Disposition: A | Discharge status: Home Health | Payer: Medicare HMO | Source: Ambulatory Visit | Attending: Orthopedic Surgery | Admitting: Orthopedic Surgery

## 2018-05-18 ENCOUNTER — Encounter (HOSPITAL_COMMUNITY): Payer: Self-pay | Admitting: Urology

## 2018-05-18 ENCOUNTER — Encounter (HOSPITAL_COMMUNITY)
Admission: RE | Disposition: A | Discharge status: Home Health | Payer: Self-pay | Source: Ambulatory Visit | Attending: Orthopedic Surgery

## 2018-05-18 ENCOUNTER — Inpatient Hospital Stay (HOSPITAL_COMMUNITY): Payer: Medicare HMO | Admitting: Anesthesiology

## 2018-05-18 DIAGNOSIS — I10 Essential (primary) hypertension: Secondary | ICD-10-CM | POA: Diagnosis present

## 2018-05-18 DIAGNOSIS — E119 Type 2 diabetes mellitus without complications: Secondary | ICD-10-CM | POA: Diagnosis not present

## 2018-05-18 DIAGNOSIS — Z87891 Personal history of nicotine dependence: Secondary | ICD-10-CM

## 2018-05-18 DIAGNOSIS — M1611 Unilateral primary osteoarthritis, right hip: Principal | ICD-10-CM | POA: Diagnosis present

## 2018-05-18 DIAGNOSIS — Z7982 Long term (current) use of aspirin: Secondary | ICD-10-CM

## 2018-05-18 DIAGNOSIS — J45909 Unspecified asthma, uncomplicated: Secondary | ICD-10-CM | POA: Diagnosis present

## 2018-05-18 DIAGNOSIS — Z833 Family history of diabetes mellitus: Secondary | ICD-10-CM | POA: Diagnosis not present

## 2018-05-18 DIAGNOSIS — Z96641 Presence of right artificial hip joint: Secondary | ICD-10-CM | POA: Diagnosis not present

## 2018-05-18 DIAGNOSIS — Z8249 Family history of ischemic heart disease and other diseases of the circulatory system: Secondary | ICD-10-CM | POA: Diagnosis not present

## 2018-05-18 DIAGNOSIS — K219 Gastro-esophageal reflux disease without esophagitis: Secondary | ICD-10-CM | POA: Diagnosis present

## 2018-05-18 DIAGNOSIS — E785 Hyperlipidemia, unspecified: Secondary | ICD-10-CM | POA: Diagnosis not present

## 2018-05-18 DIAGNOSIS — M1612 Unilateral primary osteoarthritis, left hip: Secondary | ICD-10-CM | POA: Diagnosis present

## 2018-05-18 DIAGNOSIS — Z471 Aftercare following joint replacement surgery: Secondary | ICD-10-CM | POA: Diagnosis not present

## 2018-05-18 DIAGNOSIS — Z419 Encounter for procedure for purposes other than remedying health state, unspecified: Secondary | ICD-10-CM

## 2018-05-18 DIAGNOSIS — Z90710 Acquired absence of both cervix and uterus: Secondary | ICD-10-CM

## 2018-05-18 DIAGNOSIS — Z7984 Long term (current) use of oral hypoglycemic drugs: Secondary | ICD-10-CM

## 2018-05-18 HISTORY — PX: TOTAL HIP ARTHROPLASTY: SHX124

## 2018-05-18 LAB — GLUCOSE, CAPILLARY
GLUCOSE-CAPILLARY: 147 mg/dL — AB (ref 70–99)
GLUCOSE-CAPILLARY: 251 mg/dL — AB (ref 70–99)
Glucose-Capillary: 96 mg/dL (ref 70–99)
Glucose-Capillary: 124 mg/dL — ABNORMAL HIGH (ref 70–99)

## 2018-05-18 LAB — HEMOGLOBIN A1C
Hgb A1c MFr Bld: 5.8 % — ABNORMAL HIGH (ref 4.8–5.6)
Mean Plasma Glucose: 119.76 mg/dL

## 2018-05-18 SURGERY — ARTHROPLASTY, HIP, TOTAL, ANTERIOR APPROACH
Anesthesia: General | Site: Hip | Laterality: Right

## 2018-05-18 MED ORDER — METHOCARBAMOL 500 MG PO TABS
500.0000 mg | ORAL_TABLET | Freq: Four times a day (QID) | ORAL | Status: DC | PRN
  Administered 2018-05-18 – 2018-05-20 (×2): 500 mg via ORAL
  Filled 2018-05-18 (×2): qty 1

## 2018-05-18 MED ORDER — ONDANSETRON HCL 4 MG/2ML IJ SOLN
INTRAMUSCULAR | Status: DC | PRN
  Administered 2018-05-18: 4 mg via INTRAVENOUS

## 2018-05-18 MED ORDER — ASPIRIN EC 81 MG PO TBEC: 81.0000 mg | DELAYED_RELEASE_TABLET | Freq: Two times a day (BID) | ORAL | 0 refills | Status: DC

## 2018-05-18 MED ORDER — ENALAPRIL MALEATE 5 MG PO TABS
20.0000 mg | ORAL_TABLET | Freq: Every day | ORAL | Status: DC
  Administered 2018-05-18 – 2018-05-19 (×2): 20 mg via ORAL
  Filled 2018-05-18 (×3): qty 4

## 2018-05-18 MED ORDER — SUGAMMADEX SODIUM 200 MG/2ML IV SOLN
INTRAVENOUS | Status: DC | PRN
  Administered 2018-05-18: 100 mg via INTRAVENOUS

## 2018-05-18 MED ORDER — PHENOL 1.4 % MT LIQD: 1.0000 | OROMUCOSAL | Status: DC | PRN

## 2018-05-18 MED ORDER — FENTANYL CITRATE (PF) 250 MCG/5ML IJ SOLN
INTRAMUSCULAR | Status: AC
  Filled 2018-05-18: qty 5

## 2018-05-18 MED ORDER — MENTHOL 3 MG MT LOZG: 1.0000 | LOZENGE | OROMUCOSAL | Status: DC | PRN

## 2018-05-18 MED ORDER — BUMETANIDE 1 MG PO TABS
1.0000 mg | ORAL_TABLET | Freq: Two times a day (BID) | ORAL | Status: DC
  Administered 2018-05-19 – 2018-05-20 (×2): 1 mg via ORAL
  Filled 2018-05-18 (×5): qty 1

## 2018-05-18 MED ORDER — DIPHENHYDRAMINE HCL 12.5 MG/5ML PO ELIX: 12.5000 mg | ORAL_SOLUTION | ORAL | Status: DC | PRN

## 2018-05-18 MED ORDER — PROMETHAZINE HCL 25 MG/ML IJ SOLN: 6.2500 mg | INTRAMUSCULAR | Status: DC | PRN

## 2018-05-18 MED ORDER — LIDOCAINE 2% (20 MG/ML) 5 ML SYRINGE
INTRAMUSCULAR | Status: AC
  Filled 2018-05-18: qty 5

## 2018-05-18 MED ORDER — ASPIRIN 81 MG PO CHEW
81.0000 mg | CHEWABLE_TABLET | Freq: Two times a day (BID) | ORAL | Status: DC
  Administered 2018-05-18 – 2018-05-20 (×4): 81 mg via ORAL
  Filled 2018-05-18 (×4): qty 1

## 2018-05-18 MED ORDER — ALBUTEROL SULFATE (2.5 MG/3ML) 0.083% IN NEBU: 2.5000 mg | INHALATION_SOLUTION | Freq: Four times a day (QID) | RESPIRATORY_TRACT | Status: DC | PRN

## 2018-05-18 MED ORDER — ROCURONIUM BROMIDE 10 MG/ML (PF) SYRINGE
PREFILLED_SYRINGE | INTRAVENOUS | Status: DC | PRN
  Administered 2018-05-18: 50 mg via INTRAVENOUS

## 2018-05-18 MED ORDER — HYDROMORPHONE HCL 2 MG/ML IJ SOLN: 0.5000 mg | INTRAMUSCULAR | Status: DC | PRN

## 2018-05-18 MED ORDER — DEXAMETHASONE SODIUM PHOSPHATE 10 MG/ML IJ SOLN
INTRAMUSCULAR | Status: DC | PRN
  Administered 2018-05-18: 10 mg via INTRAVENOUS

## 2018-05-18 MED ORDER — 0.9 % SODIUM CHLORIDE (POUR BTL) OPTIME
TOPICAL | Status: DC | PRN
  Administered 2018-05-18: 1000 mL

## 2018-05-18 MED ORDER — ACETAMINOPHEN 500 MG PO TABS
1000.0000 mg | ORAL_TABLET | Freq: Four times a day (QID) | ORAL | Status: AC
  Administered 2018-05-18 – 2018-05-19 (×4): 1000 mg via ORAL
  Filled 2018-05-18 (×4): qty 2

## 2018-05-18 MED ORDER — PROPOFOL 10 MG/ML IV BOLUS
INTRAVENOUS | Status: DC | PRN
  Administered 2018-05-18: 120 mg via INTRAVENOUS

## 2018-05-18 MED ORDER — HYDROMORPHONE HCL 1 MG/ML IJ SOLN
0.2500 mg | INTRAMUSCULAR | Status: DC | PRN
  Administered 2018-05-18: 0.5 mg via INTRAVENOUS

## 2018-05-18 MED ORDER — TIZANIDINE HCL 2 MG PO TABS: 2.0000 mg | ORAL_TABLET | Freq: Four times a day (QID) | ORAL | 0 refills | Status: DC | PRN

## 2018-05-18 MED ORDER — METOCLOPRAMIDE HCL 5 MG PO TABS: 5.0000 mg | ORAL_TABLET | Freq: Three times a day (TID) | ORAL | Status: DC | PRN

## 2018-05-18 MED ORDER — OXYCODONE HCL 5 MG PO TABS
5.0000 mg | ORAL_TABLET | ORAL | Status: DC | PRN
  Administered 2018-05-18: 10 mg via ORAL
  Filled 2018-05-18: qty 2
  Filled 2018-05-18: qty 1

## 2018-05-18 MED ORDER — BUPIVACAINE-EPINEPHRINE (PF) 0.5% -1:200000 IJ SOLN
INTRAMUSCULAR | Status: AC
  Filled 2018-05-18: qty 60

## 2018-05-18 MED ORDER — METFORMIN HCL ER 500 MG PO TB24
1000.0000 mg | ORAL_TABLET | Freq: Every day | ORAL | Status: DC
  Administered 2018-05-18 – 2018-05-20 (×3): 1000 mg via ORAL
  Filled 2018-05-18 (×3): qty 2

## 2018-05-18 MED ORDER — BUPIVACAINE-EPINEPHRINE (PF) 0.5% -1:200000 IJ SOLN
INTRAMUSCULAR | Status: DC | PRN
  Administered 2018-05-18: 50 mL

## 2018-05-18 MED ORDER — ONDANSETRON HCL 4 MG PO TABS: 4.0000 mg | ORAL_TABLET | Freq: Four times a day (QID) | ORAL | Status: DC | PRN

## 2018-05-18 MED ORDER — ONDANSETRON HCL 4 MG/2ML IJ SOLN
4.0000 mg | Freq: Four times a day (QID) | INTRAMUSCULAR | Status: DC | PRN
  Administered 2018-05-18: 4 mg via INTRAVENOUS
  Filled 2018-05-18: qty 2

## 2018-05-18 MED ORDER — FENTANYL CITRATE (PF) 100 MCG/2ML IJ SOLN
INTRAMUSCULAR | Status: DC | PRN
  Administered 2018-05-18 (×2): 25 ug via INTRAVENOUS
  Administered 2018-05-18 (×2): 50 ug via INTRAVENOUS
  Administered 2018-05-18: 100 ug via INTRAVENOUS

## 2018-05-18 MED ORDER — TRANEXAMIC ACID 1000 MG/10ML IV SOLN
1000.0000 mg | Freq: Once | INTRAVENOUS | Status: AC
  Administered 2018-05-18: 1000 mg via INTRAVENOUS
  Filled 2018-05-18: qty 10

## 2018-05-18 MED ORDER — ACETAMINOPHEN 325 MG PO TABS
325.0000 mg | ORAL_TABLET | Freq: Four times a day (QID) | ORAL | Status: DC | PRN
  Administered 2018-05-19: 650 mg via ORAL
  Filled 2018-05-18: qty 2

## 2018-05-18 MED ORDER — HYDROMORPHONE HCL 1 MG/ML IJ SOLN
INTRAMUSCULAR | Status: AC
  Filled 2018-05-18: qty 1

## 2018-05-18 MED ORDER — POLYETHYLENE GLYCOL 3350 17 G PO PACK: 17.0000 g | PACK | Freq: Every day | ORAL | Status: DC | PRN

## 2018-05-18 MED ORDER — MIDAZOLAM HCL 5 MG/5ML IJ SOLN
INTRAMUSCULAR | Status: DC | PRN
  Administered 2018-05-18: 2 mg via INTRAVENOUS

## 2018-05-18 MED ORDER — INSULIN ASPART 100 UNIT/ML ~~LOC~~ SOLN
0.0000 [IU] | Freq: Three times a day (TID) | SUBCUTANEOUS | Status: DC
  Administered 2018-05-19: 2 [IU] via SUBCUTANEOUS
  Administered 2018-05-19: 3 [IU] via SUBCUTANEOUS

## 2018-05-18 MED ORDER — CHLORHEXIDINE GLUCONATE 4 % EX LIQD: 60.0000 mL | Freq: Once | CUTANEOUS | Status: DC

## 2018-05-18 MED ORDER — CELECOXIB 200 MG PO CAPS
200.0000 mg | ORAL_CAPSULE | Freq: Two times a day (BID) | ORAL | Status: DC
  Administered 2018-05-18 – 2018-05-20 (×4): 200 mg via ORAL
  Filled 2018-05-18 (×4): qty 1

## 2018-05-18 MED ORDER — MIDAZOLAM HCL 2 MG/2ML IJ SOLN
INTRAMUSCULAR | Status: AC
  Filled 2018-05-18: qty 2

## 2018-05-18 MED ORDER — OXYCODONE-ACETAMINOPHEN 5-325 MG PO TABS: 1.0000 | ORAL_TABLET | ORAL | 0 refills | Status: DC | PRN

## 2018-05-18 MED ORDER — DILTIAZEM HCL ER COATED BEADS 120 MG PO CP24
120.0000 mg | ORAL_CAPSULE | Freq: Every day | ORAL | Status: DC
  Administered 2018-05-19 – 2018-05-20 (×2): 120 mg via ORAL
  Filled 2018-05-18 (×2): qty 1

## 2018-05-18 MED ORDER — FLEET ENEMA 7-19 GM/118ML RE ENEM: 1.0000 | ENEMA | Freq: Once | RECTAL | Status: DC | PRN

## 2018-05-18 MED ORDER — ONDANSETRON HCL 4 MG/2ML IJ SOLN
INTRAMUSCULAR | Status: AC
  Filled 2018-05-18: qty 2

## 2018-05-18 MED ORDER — LORATADINE 10 MG PO TABS
10.0000 mg | ORAL_TABLET | Freq: Every day | ORAL | Status: DC
  Administered 2018-05-19 – 2018-05-20 (×2): 10 mg via ORAL
  Filled 2018-05-18 (×2): qty 1

## 2018-05-18 MED ORDER — BISACODYL 5 MG PO TBEC: 5.0000 mg | DELAYED_RELEASE_TABLET | Freq: Every day | ORAL | Status: DC | PRN

## 2018-05-18 MED ORDER — PROPOFOL 10 MG/ML IV BOLUS
INTRAVENOUS | Status: AC
Start: 2018-05-18 — End: ?
  Filled 2018-05-18: qty 20

## 2018-05-18 MED ORDER — LIDOCAINE 2% (20 MG/ML) 5 ML SYRINGE
INTRAMUSCULAR | Status: DC | PRN
  Administered 2018-05-18: 60 mg via INTRAVENOUS

## 2018-05-18 MED ORDER — METFORMIN HCL ER 500 MG PO TB24: 500.0000 mg | ORAL_TABLET | Freq: Two times a day (BID) | ORAL | Status: DC

## 2018-05-18 MED ORDER — PANTOPRAZOLE SODIUM 40 MG PO TBEC
40.0000 mg | DELAYED_RELEASE_TABLET | Freq: Every day | ORAL | Status: DC
  Administered 2018-05-18 – 2018-05-20 (×3): 40 mg via ORAL
  Filled 2018-05-18 (×2): qty 1

## 2018-05-18 MED ORDER — METOCLOPRAMIDE HCL 5 MG/ML IJ SOLN: 5.0000 mg | Freq: Three times a day (TID) | INTRAMUSCULAR | Status: DC | PRN

## 2018-05-18 MED ORDER — FOLIC ACID 1 MG PO TABS
1.0000 mg | ORAL_TABLET | Freq: Every day | ORAL | Status: DC
  Administered 2018-05-19 – 2018-05-20 (×2): 1 mg via ORAL
  Filled 2018-05-18 (×2): qty 1

## 2018-05-18 MED ORDER — ALUM & MAG HYDROXIDE-SIMETH 200-200-20 MG/5ML PO SUSP: 30.0000 mL | ORAL | Status: DC | PRN

## 2018-05-18 MED ORDER — METFORMIN HCL ER 500 MG PO TB24
500.0000 mg | ORAL_TABLET | Freq: Every day | ORAL | Status: DC
  Administered 2018-05-19 – 2018-05-20 (×2): 500 mg via ORAL
  Filled 2018-05-18 (×2): qty 1

## 2018-05-18 MED ORDER — DEXAMETHASONE SODIUM PHOSPHATE 10 MG/ML IJ SOLN
INTRAMUSCULAR | Status: AC
  Filled 2018-05-18: qty 1

## 2018-05-18 MED ORDER — KCL IN DEXTROSE-NACL 20-5-0.45 MEQ/L-%-% IV SOLN
INTRAVENOUS | Status: DC
  Administered 2018-05-18 – 2018-05-19 (×3): via INTRAVENOUS
  Filled 2018-05-18 (×3): qty 1000

## 2018-05-18 MED ORDER — FAMOTIDINE 20 MG PO TABS
20.0000 mg | ORAL_TABLET | Freq: Two times a day (BID) | ORAL | Status: DC
  Administered 2018-05-18 – 2018-05-20 (×5): 20 mg via ORAL
  Filled 2018-05-18 (×5): qty 1

## 2018-05-18 MED ORDER — DOCUSATE SODIUM 100 MG PO CAPS
100.0000 mg | ORAL_CAPSULE | Freq: Two times a day (BID) | ORAL | Status: DC
  Administered 2018-05-18 – 2018-05-20 (×4): 100 mg via ORAL
  Filled 2018-05-18 (×4): qty 1

## 2018-05-18 MED ORDER — METHOCARBAMOL 1000 MG/10ML IJ SOLN
500.0000 mg | Freq: Four times a day (QID) | INTRAMUSCULAR | Status: DC | PRN
  Filled 2018-05-18: qty 5

## 2018-05-18 MED ORDER — GABAPENTIN 300 MG PO CAPS
300.0000 mg | ORAL_CAPSULE | Freq: Three times a day (TID) | ORAL | Status: DC
  Administered 2018-05-18 – 2018-05-20 (×7): 300 mg via ORAL
  Filled 2018-05-18 (×7): qty 1

## 2018-05-18 SURGICAL SUPPLY — 44 items
BAG DECANTER FOR FLEXI CONT (MISCELLANEOUS) ×4 IMPLANT
BLADE SAW SGTL 18X1.27X75 (BLADE) ×2 IMPLANT
CAPT HIP TOTAL 2 ×2 IMPLANT
COVER PERINEAL POST (MISCELLANEOUS) ×2 IMPLANT
COVER SURGICAL LIGHT HANDLE (MISCELLANEOUS) ×2 IMPLANT
DECANTER SPIKE VIAL GLASS SM (MISCELLANEOUS) ×4 IMPLANT
DRAPE C-ARM 42X72 X-RAY (DRAPES) ×2 IMPLANT
DRAPE STERI IOBAN 125X83 (DRAPES) ×2 IMPLANT
DRAPE U-SHAPE 47X51 STRL (DRAPES) ×4 IMPLANT
DRSG AQUACEL AG ADV 3.5X10 (GAUZE/BANDAGES/DRESSINGS) ×2 IMPLANT
DURAPREP 26ML APPLICATOR (WOUND CARE) ×2 IMPLANT
ELECT BLADE 4.0 EZ CLEAN MEGAD (MISCELLANEOUS) ×2
ELECT REM PT RETURN 9FT ADLT (ELECTROSURGICAL) ×2
ELECTRODE BLDE 4.0 EZ CLN MEGD (MISCELLANEOUS) ×1 IMPLANT
ELECTRODE REM PT RTRN 9FT ADLT (ELECTROSURGICAL) ×1 IMPLANT
FACESHIELD WRAPAROUND (MASK) ×6 IMPLANT
GLOVE BIO SURGEON STRL SZ7.5 (GLOVE) ×2 IMPLANT
GLOVE BIO SURGEON STRL SZ8.5 (GLOVE) ×2 IMPLANT
GLOVE BIOGEL PI IND STRL 8 (GLOVE) ×1 IMPLANT
GLOVE BIOGEL PI IND STRL 9 (GLOVE) ×1 IMPLANT
GLOVE BIOGEL PI INDICATOR 8 (GLOVE) ×1
GLOVE BIOGEL PI INDICATOR 9 (GLOVE) ×1
GOWN STRL REUS W/ TWL LRG LVL3 (GOWN DISPOSABLE) ×1 IMPLANT
GOWN STRL REUS W/ TWL XL LVL3 (GOWN DISPOSABLE) ×2 IMPLANT
GOWN STRL REUS W/TWL LRG LVL3 (GOWN DISPOSABLE) ×4
GOWN STRL REUS W/TWL XL LVL3 (GOWN DISPOSABLE) ×4
KIT BASIN OR (CUSTOM PROCEDURE TRAY) ×2 IMPLANT
KIT TURNOVER KIT B (KITS) ×2 IMPLANT
MANIFOLD NEPTUNE II (INSTRUMENTS) ×2 IMPLANT
NEEDLE HYPO 22GX1.5 SAFETY (NEEDLE) ×4 IMPLANT
NS IRRIG 1000ML POUR BTL (IV SOLUTION) ×2 IMPLANT
PACK TOTAL JOINT (CUSTOM PROCEDURE TRAY) ×2 IMPLANT
PAD ARMBOARD 7.5X6 YLW CONV (MISCELLANEOUS) ×4 IMPLANT
SUT ETHIBOND NAB CT1 #1 30IN (SUTURE) ×2 IMPLANT
SUT VIC AB 0 CT1 27 (SUTURE) ×2
SUT VIC AB 0 CT1 27XBRD ANBCTR (SUTURE) ×1 IMPLANT
SUT VIC AB 1 CTX 36 (SUTURE) ×2
SUT VIC AB 1 CTX36XBRD ANBCTR (SUTURE) ×1 IMPLANT
SUT VIC AB 2-0 CT1 27 (SUTURE) ×2
SUT VIC AB 2-0 CT1 TAPERPNT 27 (SUTURE) ×1 IMPLANT
SUT VIC AB 3-0 CT1 27 (SUTURE) ×2
SUT VIC AB 3-0 CT1 TAPERPNT 27 (SUTURE) ×1 IMPLANT
SYR CONTROL 10ML LL (SYRINGE) ×4 IMPLANT
TOWEL OR 17X26 10 PK STRL BLUE (TOWEL DISPOSABLE) ×2 IMPLANT

## 2018-05-18 NOTE — Transfer of Care (Signed)
Immediate Anesthesia Transfer of Care Note  Patient: CHRISHAWN KRING  Procedure(s) Performed: TOTAL HIP ARTHROPLASTY ANTERIOR APPROACH (Right Hip)  Patient Location: PACU  Anesthesia Type:General  Level of Consciousness: drowsy and patient cooperative  Airway & Oxygen Therapy: Patient Spontanous Breathing and Patient connected to nasal cannula oxygen  Post-op Assessment: Report given to RN, Post -op Vital signs reviewed and stable and Patient moving all extremities  Post vital signs: Reviewed and stable  Last Vitals:  Vitals Value Taken Time  BP    Temp    Pulse 103 05/18/2018  2:42 PM  Resp    SpO2 93 % 05/18/2018  2:42 PM  Vitals shown include unvalidated device data.  Last Pain:  Vitals:   05/18/18 1105  TempSrc:   PainSc: 8          Complications: No apparent anesthesia complications

## 2018-05-18 NOTE — Discharge Instructions (Signed)

## 2018-05-18 NOTE — Interval H&P Note (Signed)
History and Physical Interval Note:  05/18/2018 12:36 PM  [name]  has presented today for surgery, with the diagnosis of RIGHT HIP OSTEOARTHRITIS  The various methods of treatment have been discussed with the patient and family. After consideration of risks, benefits and other options for treatment, the patient has consented to  Procedure(s): TOTAL HIP ARTHROPLASTY ANTERIOR APPROACH (Right) as a surgical intervention .  The patient's history has been reviewed, patient examined, no change in status, stable for surgery.  I have reviewed the patient's chart and labs.  Questions were answered to the patient's satisfaction.     Kerin Salen

## 2018-05-18 NOTE — Op Note (Signed)
OPERATIVE REPORT    DATE OF PROCEDURE:  05/18/2018       PREOPERATIVE DIAGNOSIS:  RIGHT HIP OSTEOARTHRITIS                                                          POSTOPERATIVE DIAGNOSIS:  RIGHT HIP OSTEOARTHRITIS                                                           PROCEDURE: Anterior R total hip arthroplasty using a 48 mm DePuy Pinnacle  Cup, [employer], 0-degree polyethylene liner, a +1.5x32 mm ceramic head, a 2std Depuy Triloc stem   SURGEON: Kerin Salen    ASSISTANT:   Kerry Hough. [employer]  (present throughout entire procedure and necessary for timely completion of the procedure)   ANESTHESIA: Spinal BLOOD LOSS: 300 FLUID REPLACEMENT: 1600 crystalloid Antibiotic: 2gm ancef Tranexamic Acid: 1gm IV, 2gm Topical COMPLICATIONS: none    INDICATIONS FOR PROCEDURE: A 72 y.o. year-old With  RIGHT HIP CONGENITAL HIP DYSPLASIA/OSTEOARTHRITIS   for 15 years, x-rays show bone-on-bone arthritic changes, and osteophytes. Despite conservative measures with observation, anti-inflammatory medicine, narcotics, use of a cane, has severe unremitting pain and can ambulate only a few blocks before resting. Patient desires elective R total hip arthroplasty to decrease pain and increase function. The risks, benefits, and alternatives were discussed at length including but not limited to the risks of infection, bleeding, nerve injury, stiffness, blood clots, the need for revision surgery, cardiopulmonary complications, among others, and they were willing to proceed. Questions answered     PROCEDURE IN DETAIL: The patient was identified by armband,  received preoperative IV antibiotics in the holding area at [facility], taken to the operating room , appropriate anesthetic monitors  were attached and  anesthesia was induced with the patienton the gurney. The HANA boots were applied to the feet and he was then transferred to the HANA table with a peroneal post and support  underneath the non-operative le, which was locked in 5 lb traction. Theoperative lower extremity was then prepped and draped in the usual sterile fashion from just above the iliac crest to the knee. And a timeout procedure was performed. We then made a 12 cm incision along the interval at the leading edge of the tensor fascia lata of starting at 2 cm lateral to and 2 cm distal to the ASIS. Small bleeders in the skin and subcutaneous tissue identified and cauterized we dissected down to the fascia and made an incision in the fascia allowing [place] to elevate the fascia of the tensor muscle and exploited the interval between the rectus and the tensor fascia lata. A Hohmann retractor was then placed along the superior neck of the femur and a Cobra retractor along the inferior neck of the femur we teed the capsule starting out at the superior anterior aspect of the acetabulum going distally and made the T along the neck both leaflets of the T were tagged with #2 Ethibond suture. Cobra retractors were then placed along the inferior and superior neck allowing [place] to perform a standard neck cut  and removed the femoral head with a power corkscrew. We then placed a right angle Hohmann retractor along the anterior aspect of the acetabulum a spiked Cobra in the cotyloid notch and posteriorly a Muelller retractor. We then sequentially reamed up to a 47 mm basket reamer obtaining good coverage in all quadrants, verified by C-arm imaging. Under C-arm control with and hammered into place a 48 mm Pinnacle cup in 45 of abduction and 15 of anteversion. The cup seated nicely and required no supplemental screws. We then placed a central hole Eliminator and a 0 polyethylene liner. The foot was then externally rotated to 110, the HANA elevator was placed around the flare of the greater trochanter and the limb was extended and abducted delivering the proximal femur up into the wound. A medium Hohmann retractor was placed over the greater  trochanter and a Mueller retractor along the posterior femoral neck completing the exposure. We then performed releases superiorly and and inferiorly of the capsule going back to the pirformis fossa superiorly and to the lesser trochanter inferiorly. We then entered the proximal femur with the box cutting offset chisel followed by, a canal sounder, the chili pepper and broaching up to a 2std broach. This seated nicely and we reamed the calcar. A trial reduction was performed with a 1.5 mm 32 mm head.The limb lengths were excellent the hip was stable in 90 of external rotation.  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  The left hip will be replaced in 6 to 12 weeks from now.  At this point the trial components removed and we hammered into place a # 2std  Offset Tri-Lock stem with Gryption coating. A + 1.5x32 mm ceramic ball was then hammered into place the hip was reduced and final C-arm images obtained. The wound was thoroughly irrigated with normal saline solution. We repaired the ant capsule and the tensor fascia lot a with running 0 vicryl suture. the subcutaneous tissue was closed with 2-0 and 3-0 Vicryl suture followed by an Aquacil dressing. At this point the patient was awaken and transferred to hospital gurney without difficulty. The subcutaneous tissue with 0 and 2-0 undyed Vicryl suture and the skin with running 3-0 vicryl subcuticular suture. Aquacil dressing was applied. The patient was then unclamped, rolled supine, awaken extubated and taken to recovery room without difficulty in stable condition.   Kerin Salen 05/18/2018, 2:10 PM

## 2018-05-18 NOTE — Evaluation (Signed)
Physical Therapy Evaluation Patient Details Name: [name] MRN: [id number] DOB: May 22, 1946 Today's Date: 05/18/2018   History of Present Illness  Pt is a 72 y/o female s/p elecitve R THA, direct anterior. PMH includes DM, HTN, back surgery, and neck surgery.   Clinical Impression  Pt is s/p surgery above with deficits below. Pt tolerated gait within the room well, requiring min to min guard A with use of RW. Educated about supine HEP. Will continue to follow acutely to maximize functional mobility independence and safety.     Follow Up Recommendations Follow surgeon's recommendation for DC plan and follow-up therapies;Supervision for mobility/OOB    Equipment Recommendations  None recommended by PT    Recommendations for Other Services OT consult     Precautions / Restrictions Precautions Precautions: Fall Restrictions Weight Bearing Restrictions: Yes RLE Weight Bearing: Weight bearing as tolerated      Mobility  Bed Mobility Overal bed mobility: Needs Assistance Bed Mobility: Supine to Sit     Supine to sit: Min assist     General bed mobility comments: Min A for RLE assist. Increased time required.   Transfers Overall transfer level: Needs assistance Equipment used: Rolling walker (2 wheeled) Transfers: Sit to/from Stand Sit to Stand: Min assist         General transfer comment: Min A for lift assist and steadying. Verbal cues for safe hand placement.   Ambulation/Gait Ambulation/Gait assistance: Min assist;Min guard Gait Distance (Feet): 20 Feet Assistive device: Rolling walker (2 wheeled) Gait Pattern/deviations: Step-to pattern;Step-through pattern;Decreased step length - right;Decreased step length - left;Antalgic;Decreased weight shift to right Gait velocity: Decreased  Gait velocity interpretation: <1.31 ft/sec, indicative of household ambulator General Gait Details: Slow, slightly unsteady gait. Verbal cues for sequencing using RW. Min to min  guard A with use of RW to ambulate within the room.   Stairs            Wheelchair Mobility    Modified Rankin (Stroke Patients Only)       Balance Overall balance assessment: Needs assistance Sitting-balance support: No upper extremity supported;Feet supported Sitting balance-Leahy Scale: Good     Standing balance support: Bilateral upper extremity supported;During functional activity Standing balance-Leahy Scale: Poor Standing balance comment: Required use of UE support.                              Pertinent Vitals/Pain Pain Assessment: 0-10 Pain Score: 4  Pain Location: R hip  Pain Descriptors / Indicators: Aching;Operative site guarding Pain Intervention(s): Limited activity within patient's tolerance;Monitored during session;Repositioned    Home Living Family/patient expects to be discharged to:: Private residence Living Arrangements: Children;Spouse/significant other Available Help at Discharge: Family;Available 24 hours/day Type of Home: House Home Access: Ramped entrance     Home Layout: One level Home Equipment: [place] - 2 wheels;Shower seat - built in      Prior Function Level of Independence: Independent with assistive device(s)         Comments: Used RW for ambulation      Hand Dominance   Dominant Hand: Right    Extremity/Trunk Assessment   Upper Extremity Assessment Upper Extremity Assessment: Defer to OT evaluation    Lower Extremity Assessment Lower Extremity Assessment: RLE deficits/detail RLE Deficits / Details: Deficits consistent with post op pain and weakness. Able to perform ther ex below.     Cervical / Trunk Assessment Cervical / Trunk Assessment: Other exceptions Cervical /  Trunk Exceptions: recent history of lumbar surgery.   Communication   Communication: No difficulties  Cognition Arousal/Alertness: Awake/alert Behavior During Therapy: WFL for tasks assessed/performed Overall Cognitive  Status: Within Functional Limits for tasks assessed                                        General Comments General comments (skin integrity, edema, etc.): Pt's daughter and grandson present during session.     Exercises Total Joint Exercises Ankle Circles/Pumps: AROM;Both;20 reps Quad Sets: AROM;Right;10 reps Heel Slides: AROM;Right;10 reps   Assessment/Plan    PT Assessment Patient needs continued PT services  PT Problem List Decreased range of motion;Decreased strength;Decreased mobility;Decreased balance;Decreased activity tolerance;Decreased knowledge of use of DME;Decreased knowledge of precautions;Pain       PT Treatment Interventions DME instruction;Gait training;Functional mobility training;Therapeutic activities;Therapeutic exercise;Balance training;Patient/family education    PT Goals (Current goals can be found in the Care Plan section)  Acute Rehab PT Goals Patient Stated Goal: to go home  PT Goal Formulation: With patient Time For Goal Achievement: 06/01/18 Potential to Achieve Goals: Good    Frequency 7X/week   Barriers to discharge        Co-evaluation               AM-PAC PT "6 Clicks" Daily Activity  Outcome Measure Difficulty turning over in bed (including adjusting bedclothes, sheets and blankets)?: A Little Difficulty moving from lying on back to sitting on the side of the bed? : Unable Difficulty sitting down on and standing up from a chair with arms (e.g., wheelchair, bedside commode, etc,.)?: Unable Help needed moving to and from a bed to chair (including a wheelchair)?: A Little Help needed walking in hospital room?: A Little Help needed climbing 3-5 steps with a railing? : A Lot 6 Click Score: 13    End of Session Equipment Utilized During Treatment: Gait belt Activity Tolerance: Patient tolerated treatment well Patient left: in bed;with call bell/phone within reach;with family/visitor present(sitting EOB ) Nurse  Communication: Mobility status;Other (comment)(pt sitting EOB ) PT Visit Diagnosis: Unsteadiness on feet (R26.81);Other abnormalities of gait and mobility (R26.89);Pain Pain - Right/Left: Right Pain - part of body: Hip    Time: 0086-7619 PT Time Calculation (min) (ACUTE ONLY): 17 min   Charges:   PT Evaluation $PT Eval Low Complexity: 1 Low     PT G Codes:        Leighton Ruff, PT, DPT  Acute Rehabilitation Services  Pager: [phone]   Rudean Hitt 05/18/2018, 6:11 PM

## 2018-05-18 NOTE — Progress Notes (Signed)
Called ortho tech to install trapeze. Ortho tech stated he would come.

## 2018-05-18 NOTE — Anesthesia Preprocedure Evaluation (Signed)
Anesthesia Evaluation  Patient identified by MRN, date of birth, ID band Patient awake    Reviewed: Allergy & Precautions, NPO status , Patient's Chart, lab work & pertinent test results  Airway Mallampati: II  TM Distance: >3 FB Neck ROM: Full    Dental no notable dental hx.    Pulmonary neg pulmonary ROS, former smoker,    Pulmonary exam normal breath sounds clear to auscultation       Cardiovascular hypertension, Normal cardiovascular exam Rhythm:Regular Rate:Normal     Neuro/Psych negative neurological ROS  negative psych ROS   GI/Hepatic negative GI ROS, Neg liver ROS,   Endo/Other  diabetes  Renal/GU negative Renal ROS  negative genitourinary   Musculoskeletal negative musculoskeletal ROS (+)   Abdominal   Peds negative pediatric ROS (+)  Hematology negative hematology ROS (+)   Anesthesia Other Findings   Reproductive/Obstetrics negative OB ROS                             Anesthesia Physical Anesthesia Plan  ASA: II  Anesthesia Plan: General   Post-op Pain Management:    Induction: Intravenous  PONV Risk Score and Plan: 3 and Ondansetron, Dexamethasone and Treatment may vary due to age or medical condition  Airway Management Planned: Oral ETT  Additional Equipment:   Intra-op Plan:   Post-operative Plan: Extubation in OR  Informed Consent: I have reviewed the patients History and Physical, chart, labs and discussed the procedure including the risks, benefits and alternatives for the proposed anesthesia with the patient or authorized representative who has indicated his/her understanding and acceptance.   Dental advisory given  Plan Discussed with: CRNA and Surgeon  Anesthesia Plan Comments:         Anesthesia Quick Evaluation

## 2018-05-18 NOTE — Progress Notes (Signed)
Orthopedic Tech Progress Note Patient Details:  [name] 07/05/1946 [id number]      Post Interventions Patient Tolerated: Well Instructions Provided: Care of device, Adjustment of device   [name] 05/18/2018, 10:01 PM

## 2018-05-18 NOTE — Anesthesia Procedure Notes (Signed)
Procedure Name: Intubation Date/Time: 05/18/2018 12:59 PM Performed by: Moshe Salisbury, CRNA Pre-anesthesia Checklist: Patient identified, Emergency Drugs available, Suction available and Patient being monitored Patient Re-evaluated:Patient Re-evaluated prior to induction Oxygen Delivery Method: Circle System Utilized Preoxygenation: Pre-oxygenation with 100% oxygen Induction Type: IV induction Ventilation: Mask ventilation without difficulty Laryngoscope Size: Mac and 3 Grade View: Grade I Tube type: Oral Tube size: 7.5 mm Number of attempts: 1 Airway Equipment and Method: Stylet Placement Confirmation: ETT inserted through vocal cords under direct vision,  positive ETCO2 and breath sounds checked- equal and bilateral Secured at: 19 cm Tube secured with: Tape Dental Injury: Teeth and Oropharynx as per pre-operative assessment

## 2018-05-19 ENCOUNTER — Other Ambulatory Visit: Payer: Self-pay

## 2018-05-19 ENCOUNTER — Encounter (HOSPITAL_COMMUNITY): Payer: Self-pay | Admitting: General Practice

## 2018-05-19 LAB — BASIC METABOLIC PANEL
ANION GAP: 6 (ref 5–15)
BUN: 11 mg/dL (ref 8–23)
CALCIUM: 8.5 mg/dL — AB (ref 8.9–10.3)
CO2: 25 mmol/L (ref 22–32)
Chloride: 104 mmol/L (ref 98–111)
Creatinine, Ser: 0.7 mg/dL (ref 0.44–1.00)
Glucose, Bld: 199 mg/dL — ABNORMAL HIGH (ref 70–99)
POTASSIUM: 4.7 mmol/L (ref 3.5–5.1)
SODIUM: 135 mmol/L (ref 135–145)

## 2018-05-19 LAB — CBC
HCT: 32.4 % — ABNORMAL LOW (ref 36.0–46.0)
Hemoglobin: 10 g/dL — ABNORMAL LOW (ref 12.0–15.0)
MCH: 26.5 pg (ref 26.0–34.0)
MCHC: 30.9 g/dL (ref 30.0–36.0)
MCV: 85.9 fL (ref 78.0–100.0)
PLATELETS: 376 10*3/uL (ref 150–400)
RBC: 3.77 MIL/uL — ABNORMAL LOW (ref 3.87–5.11)
RDW: 19.8 % — AB (ref 11.5–15.5)
WBC: 15.8 10*3/uL — AB (ref 4.0–10.5)

## 2018-05-19 LAB — GLUCOSE, CAPILLARY
GLUCOSE-CAPILLARY: 175 mg/dL — AB (ref 70–99)
GLUCOSE-CAPILLARY: 129 mg/dL — AB (ref 70–99)
GLUCOSE-CAPILLARY: 96 mg/dL (ref 70–99)
GLUCOSE-CAPILLARY: 160 mg/dL — AB (ref 70–99)

## 2018-05-19 NOTE — Progress Notes (Signed)
   05/19/18 1500  PT Visit Information  Assistance Needed +1  Pt is progressing toward PT goals; she is motivated to work with PT and d/c tomorrow, continue to follow acutely  History of Present Illness Pt is a 72 y/o female s/p elecitve R THA, direct anterior. PMH includes DM, HTN, back surgery, and neck surgery.   Subjective Data  Patient Stated Goal to go home   Precautions  Precautions Fall  Restrictions  RLE Weight Bearing WBAT  Cognition  Arousal/Alertness Awake/alert  Behavior During Therapy WFL for tasks assessed/performed  Overall Cognitive Status Within Functional Limits for tasks assessed  Bed Mobility  Overal bed mobility Needs Assistance  Bed Mobility Supine to Sit  Supine to sit Supervision  General bed mobility comments incr time, no physical assist needed, HOB at ~30*  Transfers  Overall transfer level Needs assistance  Equipment used Rolling walker (2 wheeled)  Transfers Sit to/from Stand  Sit to Stand Min guard  General transfer comment cues for hand placement and overall safety  Ambulation/Gait  Ambulation/Gait assistance Min guard  Assistive device Rolling walker (2 wheeled)  Gait Pattern/deviations Step-to pattern;Step-through pattern;Decreased stride length  General Gait Details verbal cues for RW position and safety, especially with turns  Gait velocity Decreased   Balance  Sitting balance-Leahy Scale Good  Standing balance-Leahy Scale Fair  Standing balance comment static stand without UE support  Total Joint Exercises  Heel Slides AROM;Right;10 reps  Hip ABduction/ADduction AROM;Right;10 reps  PT - End of Session  Equipment Utilized During Treatment Gait belt  Activity Tolerance Patient tolerated treatment well  Patient left with call bell/phone within reach;in bed;with bed alarm set   PT - Assessment/Plan  PT Plan Current plan remains appropriate  PT Visit Diagnosis Unsteadiness on feet (R26.81);Other abnormalities of gait and mobility  (R26.89);Pain  Pain - Right/Left Right  Pain - part of body Hip  PT Frequency (ACUTE ONLY) 7X/week  Follow Up Recommendations Follow surgeon's recommendation for DC plan and follow-up therapies;Supervision for mobility/OOB  PT equipment None recommended by PT  AM-PAC PT "6 Clicks" Daily Activity Outcome Measure  Difficulty turning over in bed (including adjusting bedclothes, sheets and blankets)? 3  Difficulty moving from lying on back to sitting on the side of the bed?  3  Difficulty sitting down on and standing up from a chair with arms (e.g., wheelchair, bedside commode, etc,.)? 3  Help needed moving to and from a bed to chair (including a wheelchair)? 3  Help needed walking in hospital room? 3  Help needed climbing 3-5 steps with a railing?  2  6 Click Score 17  Mobility G Code  CK  PT Goal Progression  Progress towards PT goals Progressing toward goals  Acute Rehab PT Goals  PT Goal Formulation With patient  Time For Goal Achievement 06/01/18  Potential to Achieve Goals Good  PT Time Calculation  PT Start Time (ACUTE ONLY) 1541  PT Stop Time (ACUTE ONLY) 1555  PT Time Calculation (min) (ACUTE ONLY) 14 min  PT General Charges  $$ ACUTE PT VISIT 1 Visit  PT Treatments  $Gait Training 8-22 mins  Kenyon Ana, [place] Pager: [phone] 05/19/2018

## 2018-05-19 NOTE — Progress Notes (Signed)
Physical Therapy Treatment Patient Details Name: [name] MRN: [id number] DOB: 1945/12/08 Today's Date: 05/19/2018    History of Present Illness Pt is a 72 y/o female s/p elecitve R THA, direct anterior. PMH includes DM, HTN, back surgery, and neck surgery.     PT Comments    Pt is progressing toward PT goals;  Pt is quite motivated but expresses being under a lot of stress with regard to her husband who has dementia and falls "a lot"; pt has supportive daughter, also her mother is coming to stay with her to assist as needed (her mother is a retired [employer] manager); will continue to follow acutely, pt should be ready for d/c tomorrow as planned;  Follow Up Recommendations  Follow surgeon's recommendation for DC plan and follow-up therapies;Supervision for mobility/OOB     Equipment Recommendations  None recommended by PT    Recommendations for Other Services       Precautions / Restrictions Precautions Precautions: Fall Restrictions Weight Bearing Restrictions: Yes RLE Weight Bearing: Weight bearing as tolerated    Mobility  Bed Mobility Overal bed mobility: Needs Assistance Bed Mobility: Supine to Sit     Supine to sit: Supervision     General bed mobility comments: incr time, no physical assist needed, HOB at ~30*  Transfers Overall transfer level: Needs assistance Equipment used: Rolling walker (2 wheeled) Transfers: Sit to/from Stand Sit to Stand: Min guard         General transfer comment: cues for hand placement and overall safety  Ambulation/Gait Ambulation/Gait assistance: Min guard Gait Distance (Feet): 100 Feet Assistive device: Rolling walker (2 wheeled) Gait Pattern/deviations: Step-to pattern;Step-through pattern;Decreased stride length Gait velocity: Decreased    General Gait Details: verbal cues for RW position and safety, especially with turns   Chief Strategy Officer    Modified Rankin (Stroke Patients Only)        Balance                                            Cognition Arousal/Alertness: Awake/alert Behavior During Therapy: WFL for tasks assessed/performed Overall Cognitive Status: Within Functional Limits for tasks assessed                                        Exercises Total Joint Exercises Ankle Circles/Pumps: AROM;Both;20 reps Quad Sets: AROM;Right;10 reps Heel Slides: AROM;Right;10 reps Long Arc Quad: AROM;Right;10 reps;Seated    General Comments        Pertinent Vitals/Pain Pain Assessment: No/denies pain Pain Intervention(s): Ice applied;Repositioned    Home Living Family/patient expects to be discharged to:: Private residence Living Arrangements: Spouse/significant other;Children                  Prior Function            PT Goals (current goals can now be found in the care plan section) Acute Rehab PT Goals Patient Stated Goal: to go home  PT Goal Formulation: With patient Time For Goal Achievement: 06/01/18 Potential to Achieve Goals: Good Progress towards PT goals: Progressing toward goals    Frequency    7X/week      PT Plan Current plan remains appropriate    Co-evaluation  AM-PAC PT "6 Clicks" Daily Activity  Outcome Measure  Difficulty turning over in bed (including adjusting bedclothes, sheets and blankets)?: A Little Difficulty moving from lying on back to sitting on the side of the bed? : Unable Difficulty sitting down on and standing up from a chair with arms (e.g., wheelchair, bedside commode, etc,.)?: A Little Help needed moving to and from a bed to chair (including a wheelchair)?: A Little Help needed walking in hospital room?: A Little Help needed climbing 3-5 steps with a railing? : A Lot 6 Click Score: 15    End of Session Equipment Utilized During Treatment: Gait belt Activity Tolerance: Patient tolerated treatment well Patient left: in chair;with call bell/phone  within reach   PT Visit Diagnosis: Unsteadiness on feet (R26.81);Other abnormalities of gait and mobility (R26.89);Pain Pain - Right/Left: Right Pain - part of body: Hip     Time: 1829-9371 PT Time Calculation (min) (ACUTE ONLY): 29 min  Charges:  $Gait Training: 8-22 mins $Therapeutic Exercise: 8-22 mins                    G CodesKenyon Ana, PT Pager: [phone] 05/19/2018    Kenyon Ana 05/19/2018, 11:22 AM

## 2018-05-19 NOTE — Progress Notes (Signed)
PATIENT ID: [name]  MRN: [id number]  DOB/AGE:  November 30, 1945 / 72 y.o.  1 Day Post-Op Procedure(s) (LRB): TOTAL HIP ARTHROPLASTY ANTERIOR APPROACH (Right)    PROGRESS NOTE Subjective: Patient is alert, oriented, No Nausea, No Vomiting, yes passing gas, . Taking PO Well. Denies SOB, Chest or Calf Pain. Using Incentive Spirometer, PAS in place. Ambulate Has been up walking in room last night.  She is weightbearing as tolerated Patient reports pain as  2/10  .    Objective: Vital signs in last 24 hours: Vitals:   05/18/18 1602 05/18/18 1946 05/19/18 0004 05/19/18 0416  BP: (Abnormal) 147/78 128/73 109/64 113/65  Pulse: 89 84 77 80  Resp:  16 14 14   Temp: 98.1 F (36.7 C) 98.6 F (37 C) 98.1 F (36.7 C) 98 F (36.7 C)  TempSrc: Oral Oral Oral Oral  SpO2: 98% 99% 100% 99%  Weight:      Height:          Intake/Output from previous day: I/O last 3 completed shifts: In: 2107.7 [P.O.:480; I.V.:1552.7; Other:75] Out: 450 [Urine:150; Blood:300]   Intake/Output this shift: No intake/output data recorded.   LABORATORY DATA: Recent Labs    05/18/18 1447 05/18/18 1634 05/18/18 2114 05/19/18 0435  WBC  --   --   --  15.8*  HGB  --   --   --  10.0*  HCT  --   --   --  32.4*  PLT  --   --   --  376  NA  --   --   --  135  K  --   --   --  4.7  CL  --   --   --  104  CO2  --   --   --  25  BUN  --   --   --  11  CREATININE  --   --   --  0.70  GLUCOSE  --   --   --  199*  GLUCAP 147* 124* 251*  --   CALCIUM  --   --   --  8.5*    Examination: Neurologically intact ABD soft Neurovascular intact Sensation intact distally Intact pulses distally Dorsiflexion/Plantar flexion intact Incision: dressing C/D/I No cellulitis present Compartment soft} XR AP&Lat of hip shows well placed\fixed THA  Assessment:   1 Day Post-Op Procedure(s) (LRB): TOTAL HIP ARTHROPLASTY ANTERIOR APPROACH (Right) ADDITIONAL DIAGNOSIS:  Expected Acute Blood Loss Anemia, Diabetes and  Hypertension  Plan: PT/OT WBAT, THA  DVT Prophylaxis: SCDx72 hrs, ASA 325 mg BID x 2 weeks  DISCHARGE PLAN: Home  DISCHARGE NEEDS: HHPT, Walker and 3-in-1 comode seat

## 2018-05-19 NOTE — Anesthesia Postprocedure Evaluation (Signed)
Anesthesia Post Note  Patient: SHERIAL EBRAHIM  Procedure(s) Performed: TOTAL HIP ARTHROPLASTY ANTERIOR APPROACH (Right Hip)     Patient location during evaluation: PACU Anesthesia Type: General Level of consciousness: awake and alert Pain management: pain level controlled Vital Signs Assessment: post-procedure vital signs reviewed and stable Respiratory status: spontaneous breathing, nonlabored ventilation, respiratory function stable and patient connected to nasal cannula oxygen Cardiovascular status: blood pressure returned to baseline and stable Postop Assessment: no apparent nausea or vomiting Anesthetic complications: no    Last Vitals:  Vitals:   05/19/18 0004 05/19/18 0416  BP: 109/64 113/65  Pulse: 77 80  Resp: 14 14  Temp: 36.7 C 36.7 C  SpO2: 100% 99%    Last Pain:  Vitals:   05/19/18 0800  TempSrc:   PainSc: 2                  Janiqua Friscia

## 2018-05-19 NOTE — Care Management Note (Addendum)
Case Management Note  Patient Details  Name: [name] MRN: [id number] Date of Birth: 17-Feb-1946  Subjective/Objective:   Pt is s/p total hip arthroplasty                  Action/Plan:  PTA independent from home.  All required equipment arranged prior to admit.  Pt also informed CM that she has already been set up with PT as ordered PTA - pt is unsure of agency name.  Pts family will provide support/supervision as needed at discharge.  Pt has PCP and denied barriers with obtaining/paying for meds   Expected Discharge Date:                  Expected Discharge Plan:  Home/Self Care  In-House Referral:     Discharge planning Services  CM Consult  Post Acute Care Choice:  Durable Medical Equipment Choice offered to:  Patient  DME Arranged:  3-N-1, Walker rolling DME Agency:   [facility]  HH Arranged:    [place] Agency:     Status of Service:     If discussed at [employer], dates discussed:    Additional Comments:  Maryclare Labrador, RN 05/19/2018, 2:47 PM

## 2018-05-20 LAB — CBC
HCT: 27.6 % — ABNORMAL LOW (ref 36.0–46.0)
HEMOGLOBIN: 8.7 g/dL — AB (ref 12.0–15.0)
MCH: 26.9 pg (ref 26.0–34.0)
MCHC: 31.5 g/dL (ref 30.0–36.0)
MCV: 85.4 fL (ref 78.0–100.0)
Platelets: 314 10*3/uL (ref 150–400)
RBC: 3.23 MIL/uL — ABNORMAL LOW (ref 3.87–5.11)
RDW: 19.4 % — ABNORMAL HIGH (ref 11.5–15.5)
WBC: 12.2 10*3/uL — ABNORMAL HIGH (ref 4.0–10.5)

## 2018-05-20 LAB — GLUCOSE, CAPILLARY
Glucose-Capillary: 77 mg/dL (ref 70–99)
Glucose-Capillary: 98 mg/dL (ref 70–99)
Glucose-Capillary: 84 mg/dL (ref 70–99)

## 2018-05-20 NOTE — Progress Notes (Signed)
AVS given and reviewed with pt. Printed prescriptions provided. All questions answered to satisfaction. Pt awaiting daughter for transportation.

## 2018-05-20 NOTE — Progress Notes (Signed)
Physical Therapy Treatment Patient Details Name: [name] MRN: [id number] DOB: June 24, 1946 Today's Date: 05/20/2018    History of Present Illness Pt is a 72 y/o female s/p elecitve R THA, direct anterior. PMH includes DM, HTN, back surgery, and neck surgery.     PT Comments    Pt tolerated addition of final HEP exercises well, performing them with little difficulty. She continues to show good form in bed mobility, transfers, and gait. She has met all of her goals at this time.  Follow Up Recommendations  Follow surgeon's recommendation for DC plan and follow-up therapies;Supervision for mobility/OOB     Equipment Recommendations  None recommended by PT    Recommendations for Other Services OT consult     Precautions / Restrictions Precautions Precautions: Fall Restrictions Weight Bearing Restrictions: Yes RLE Weight Bearing: Weight bearing as tolerated    Mobility  Bed Mobility Overal bed mobility: Modified Independent Bed Mobility: Supine to Sit     Supine to sit: Modified independent (Device/Increase time)     General bed mobility comments: No physical assist needed, able to transfer sit-supine with bed flat  Transfers Overall transfer level: Modified independent Equipment used: Rolling walker (2 wheeled) Transfers: Sit to/from Stand Sit to Stand: Modified independent (Device/Increase time)            Ambulation/Gait Ambulation/Gait assistance: Supervision Gait Distance (Feet): 300 Feet Assistive device: Rolling walker (2 wheeled) Gait Pattern/deviations: Step-to pattern;Decreased stride length;Decreased step length - left     General Gait Details: VCs for equal step length   Stairs             Wheelchair Mobility    Modified Rankin (Stroke Patients Only)       Balance Overall balance assessment: Modified Independent Sitting-balance support: No upper extremity supported;Feet supported Sitting balance-Leahy Scale: Good      Standing balance support: Bilateral upper extremity supported;During functional activity Standing balance-Leahy Scale: Fair Standing balance comment: static stand without UE support                            Cognition Arousal/Alertness: Awake/alert Behavior During Therapy: WFL for tasks assessed/performed Overall Cognitive Status: Within Functional Limits for tasks assessed                                        Exercises Total Joint Exercises Ankle Circles/Pumps: AROM;Both;20 reps;Supine Quad Sets: AROM;Right;10 reps;Supine Short Arc Quad: AROM;Right;10 reps;Supine Heel Slides: AROM;Right;10 reps;Supine Hip ABduction/ADduction: AROM;Right;20 reps;Supine;Standing(10 reps supine, 10 reps standing) Long Arc Quad: AROM;Right;10 reps;Seated Knee Flexion: AROM;Right;10 reps;Standing Marching in Standing: AROM;Right;10 reps;Standing Standing Hip Extension: AROM;Right;10 reps;Standing    General Comments        Pertinent Vitals/Pain Pain Assessment: Faces Faces Pain Scale: Hurts a little bit Pain Location: R hip  Pain Descriptors / Indicators: Aching;Operative site guarding Pain Intervention(s): Repositioned;Monitored during session    Home Living                      Prior Function            PT Goals (current goals can now be found in the care plan section) Acute Rehab PT Goals Patient Stated Goal: to go home  PT Goal Formulation: With patient Potential to Achieve Goals: Good Progress towards PT goals: Progressing toward goals    Frequency  7X/week      PT Plan Current plan remains appropriate    Co-evaluation              AM-PAC PT "6 Clicks" Daily Activity  Outcome Measure  Difficulty turning over in bed (including adjusting bedclothes, sheets and blankets)?: A Little Difficulty moving from lying on back to sitting on the side of the bed? : A Little Difficulty sitting down on and standing up from a chair with  arms (e.g., wheelchair, bedside commode, etc,.)?: A Little Help needed moving to and from a bed to chair (including a wheelchair)?: A Little Help needed walking in hospital room?: A Little Help needed climbing 3-5 steps with a railing? : A Lot 6 Click Score: 17    End of Session Equipment Utilized During Treatment: Gait belt Activity Tolerance: Patient tolerated treatment well Patient left: in bed;with call bell/phone within reach   PT Visit Diagnosis: Unsteadiness on feet (R26.81);Other abnormalities of gait and mobility (R26.89);Pain Pain - Right/Left: Right Pain - part of body: Hip     Time: 6659-9357 PT Time Calculation (min) (ACUTE ONLY): 19 min  Charges:  $Gait Training: 8-22 mins                    G Codes:       Scarlett Presto, SPTA   Yuepheng Schaller Sheila Oats 05/20/2018, 3:05 PM

## 2018-05-20 NOTE — Progress Notes (Signed)
PATIENT ID: [name]  MRN: [id number]  DOB/AGE:  1946/05/08 / 72 y.o.  2 Days Post-Op Procedure(s) (LRB): TOTAL HIP ARTHROPLASTY ANTERIOR APPROACH (Right)    PROGRESS NOTE Subjective: Patient is alert, oriented, no Nausea, no Vomiting, yes passing gas, . Taking PO well. Denies SOB, Chest or Calf Pain. Using Incentive Spirometer, PAS in place. Ambulate WBAT with pt walking 100 ft with therapy Patient reports pain as  Mild to moderate .    Objective: Vital signs in last 24 hours: Vitals:   05/19/18 1704 05/19/18 2019 05/20/18 0416 05/20/18 0836  BP: (!) 121/59 115/62 (!) 103/58 124/62  Pulse: 86 88 70 90  Resp:   13 16  Temp: 98.3 F (36.8 C) 98.2 F (36.8 C) 98 F (36.7 C)   TempSrc: Oral Oral Oral   SpO2: 99% 100% 97% 100%  Weight:      Height:          Intake/Output from previous day: I/O last 3 completed shifts: In: 3394.4 [P.O.:960; I.V.:2434.4] Out: -    Intake/Output this shift: No intake/output data recorded.   LABORATORY DATA: Recent Labs    05/19/18 0435  05/19/18 1633 05/19/18 2120 05/20/18 0552 05/20/18 0717  WBC 15.8*  --   --   --  12.2*  --   HGB 10.0*  --   --   --  8.7*  --   HCT 32.4*  --   --   --  27.6*  --   PLT 376  --   --   --  314  --   NA 135  --   --   --   --   --   K 4.7  --   --   --   --   --   CL 104  --   --   --   --   --   CO2 25  --   --   --   --   --   BUN 11  --   --   --   --   --   CREATININE 0.70  --   --   --   --   --   GLUCOSE 199*  --   --   --   --   --   GLUCAP  --    < > 96 160*  --  77  CALCIUM 8.5*  --   --   --   --   --    < > = values in this interval not displayed.    Examination: Neurologically intact Neurovascular intact Sensation intact distally Intact pulses distally Dorsiflexion/Plantar flexion intact Incision: dressing C/D/I No cellulitis present Compartment soft} XR AP&Lat of hip shows well placed\fixed THA  Assessment:   2 Days Post-Op Procedure(s) (LRB): TOTAL HIP ARTHROPLASTY  ANTERIOR APPROACH (Right) ADDITIONAL DIAGNOSIS:  Expected Acute Blood Loss Anemia, Diabetes and Hypertension  Plan: PT/OT WBAT, THA  DVT Prophylaxis: SCDx72 hrs, ASA 81 mg BID x 2 weeks, then once daily x 2 weeks  DISCHARGE PLAN: Home  DISCHARGE NEEDS: HHPT, Walker and 3-in-1 comode seat

## 2018-05-20 NOTE — Discharge Summary (Signed)
Patient ID: [name] MRN: [id number] DOB/AGE: [birth year] 72 y.o.  Admit date: 05/18/2018 Discharge date: 05/20/2018  Admission Diagnoses:  Principal Problem:   Osteoarthritis of right hip Active Problems:   Primary osteoarthritis of right hip   Discharge Diagnoses:  Same  Past Medical History:  Diagnosis Date  . Arthritis   . Asthma   . Back pain   . Diabetes mellitus    type 2  . Dyspnea    with asthma attacks   . Dysrhythmia   . Family history of adverse reaction to anesthesia    my first cousin had difficulty waking up   . Fibroid    ovarian  . GERD (gastroesophageal reflux disease)   . History of staph infection    in hospital for 11 days/ in 2007  . Hyperlipidemia   . Hypertension   . Ovarian cyst   . Radiculopathy   . Wears glasses     Surgeries: Procedure(s): TOTAL HIP ARTHROPLASTY ANTERIOR APPROACH on 05/18/2018   Consultants:   Discharged Condition: Improved  Hospital Course: [name] is an 72 y.o. female who was admitted 05/18/2018 for operative treatment ofOsteoarthritis of right hip. Patient has severe unremitting pain that affects sleep, daily activities, and work/hobbies. After pre-op clearance the patient was taken to the operating room on 05/18/2018 and underwent  Procedure(s): TOTAL HIP ARTHROPLASTY ANTERIOR APPROACH.    Patient was given perioperative antibiotics:  Anti-infectives (From admission, onward)   Start     Dose/Rate Route Frequency Ordered Stop   05/18/18 1200  ceFAZolin (ANCEF) IVPB 2g/100 mL premix     2 g 200 mL/hr over 30 Minutes Intravenous To ShortStay Surgical 05/17/18 1234 05/18/18 1300       Patient was given sequential compression devices, early ambulation, and chemoprophylaxis to prevent DVT.  Patient benefited maximally from hospital stay and there were no complications.    Recent vital signs:  Patient Vitals for the past 24 hrs:  BP Temp Temp src Pulse Resp SpO2  05/20/18 0836 124/62 - - 90 16 100 %   05/20/18 0416 (!) 103/58 98 F (36.7 C) Oral 70 13 97 %  05/19/18 2019 115/62 98.2 F (36.8 C) Oral 88 - 100 %  05/19/18 1704 (!) 121/59 98.3 F (36.8 C) Oral 86 - 99 %     Recent laboratory studies:  Recent Labs    05/19/18 0435 05/20/18 0552  WBC 15.8* 12.2*  HGB 10.0* 8.7*  HCT 32.4* 27.6*  PLT 376 314  NA 135  --   K 4.7  --   CL 104  --   CO2 25  --   BUN 11  --   CREATININE 0.70  --   GLUCOSE 199*  --   CALCIUM 8.5*  --      Discharge Medications:   Allergies as of 05/20/2018      Reactions   Ivp Dye [iodinated Diagnostic Agents] Nausea And Vomiting   Prednisone Other (See Comments)   reflux      Medication List    STOP taking these medications   diclofenac 75 MG EC tablet Commonly known as:  VOLTAREN     TAKE these medications   albuterol 108 (90 Base) MCG/ACT inhaler Commonly known as:  PROVENTIL HFA;VENTOLIN HFA Inhale 2 puffs into the lungs every 6 (six) hours as needed for wheezing or shortness of breath.   aspirin EC 81 MG tablet Take 1 tablet (81 mg total) by mouth 2 (two) times daily. What  changed:  when to take this   bumetanide 1 MG tablet Commonly known as:  BUMEX Take 1 mg by mouth 2 (two) times daily.   cetirizine 10 MG tablet Commonly known as:  ZYRTEC Take 10 mg by mouth daily as needed for allergies (during Spring/Summer).   cholecalciferol 1000 units tablet Commonly known as:  VITAMIN D Take 1,000 Units by mouth daily.   diltiazem 120 MG 24 hr capsule Commonly known as:  CARDIZEM CD Take 120 mg by mouth daily before breakfast.   enalapril 20 MG tablet Commonly known as:  VASOTEC Take 20 mg by mouth at bedtime.   folic acid 646 MCG tablet Commonly known as:  FOLVITE Take 800 mcg by mouth daily.   gabapentin 300 MG capsule Commonly known as:  NEURONTIN Take 300 mg by mouth 3 (three) times daily.   lovastatin 40 MG tablet Commonly known as:  MEVACOR Take 40 mg by mouth at bedtime.   Magnesium 300 MG Caps Take  300 mg by mouth daily.   metFORMIN 500 MG 24 hr tablet Commonly known as:  GLUCOPHAGE-XR Take 500-1,000 mg by mouth 2 (two) times daily. Take 1 tablet (500 mg) in the morning and take 2 tablets (1000 mg) with supper   methotrexate 2.5 MG tablet Commonly known as:  RHEUMATREX Take 12.5 mg by mouth every Sunday at 6pm. Caution:Chemotherapy. Protect from light.   multivitamin with minerals Tabs tablet Take 1 tablet by mouth daily.   oxyCODONE-acetaminophen 5-325 MG tablet Commonly known as:  PERCOCET/ROXICET Take 1 tablet by mouth every 4 (four) hours as needed for severe pain.   ranitidine 300 MG tablet Commonly known as:  ZANTAC Take 300 mg by mouth 2 (two) times daily. Lunch & supper   sodium chloride 0.65 % Soln nasal spray Commonly known as:  OCEAN Place 1-2 sprays into the nose 4 (four) times daily as needed for congestion.   tiZANidine 2 MG tablet Commonly known as:  ZANAFLEX Take 1 tablet (2 mg total) by mouth every 6 (six) hours as needed.            Durable Medical Equipment  (From admission, onward)        Start     Ordered   05/18/18 1604  DME Walker rolling  Once    Question:  Patient needs a walker to treat with the following condition  Answer:  Status post right hip replacement   05/18/18 1603   05/18/18 1604  DME 3 n 1  Once     05/18/18 1603       Discharge Care Instructions  (From admission, onward)        Start     Ordered   05/20/18 0000  Weight bearing as tolerated     06 /28/19 0926      Diagnostic Studies: Dg Chest 2 View  Result Date: 05/09/2018 CLINICAL DATA:  Preoperative chest exam prior to right hip joint replacement. History of asthma, hypertension, diabetes, and gastroesophageal reflux. EXAM: CHEST - 2 VIEW COMPARISON:  Chest x-ray of September 21, 2016 FINDINGS: The lungs are adequately inflated. The interstitial markings are mildly prominent. There is no alveolar infiltrate or pleural effusion. The heart and pulmonary vascularity  are normal. The mediastinum is normal in width. There is calcification in the wall of the aortic arch. There is no pleural effusion. There is multilevel degenerative disc disease of the thoracic spine. IMPRESSION: No acute cardiopulmonary abnormality. Mild chronic bronchitic-reactive airway changes. Thoracic aortic atherosclerosis. Electronically Signed  By: David  Martinique M.D.   On: 05/09/2018 11:02   Dg C-arm 1-60 Min  Result Date: 05/18/2018 CLINICAL DATA:  Total hip arthroplasty. EXAM: DG C-ARM 61-120 MIN; OPERATIVE RIGHT HIP WITH PELVIS COMPARISON:  None. FINDINGS: Single AP image shows a total right hip arthroplasty. Prosthesis is located and there is no periprosthetic fracture on the single view. IMPRESSION: Fluoroscopy for total hip arthroplasty.  No unexpected finding. Electronically Signed   By: Monte Fantasia M.D.   On: 05/18/2018 15:54   Dg Hip Operative Unilat W Or W/o Pelvis Right  Result Date: 05/18/2018 CLINICAL DATA:  Total hip arthroplasty. EXAM: DG C-ARM 61-120 MIN; OPERATIVE RIGHT HIP WITH PELVIS COMPARISON:  None. FINDINGS: Single AP image shows a total right hip arthroplasty. Prosthesis is located and there is no periprosthetic fracture on the single view. IMPRESSION: Fluoroscopy for total hip arthroplasty.  No unexpected finding. Electronically Signed   By: Monte Fantasia M.D.   On: 05/18/2018 15:54    Disposition: Discharge disposition: 01-Home or Self Care       Discharge Instructions    Call MD / Call 911   Complete by:  As directed    If you experience chest pain or shortness of breath, CALL 911 and be transported to the hospital emergency room.  If you develope a fever above 101 F, pus (white drainage) or increased drainage or redness at the wound, or calf pain, call your surgeon's office.   Constipation Prevention   Complete by:  As directed    Drink plenty of fluids.  Prune juice may be helpful.  You may use a stool softener, such as Colace (over the counter)  100 mg twice a day.  Use MiraLax (over the counter) for constipation as needed.   Diet - low sodium heart healthy   Complete by:  As directed    Driving restrictions   Complete by:  As directed    No driving for 2 weeks   Increase activity slowly as tolerated   Complete by:  As directed    Patient may shower   Complete by:  As directed    You may shower without a dressing once there is no drainage.  Do not wash over the wound.  If drainage remains, cover wound with plastic wrap and then shower.   Weight bearing as tolerated   Complete by:  As directed       Follow-up Information    Frederik Pear, MD In 2 weeks.   Specialty:  Orthopedic Surgery Contact information: [place] [address] [phone]            Signed: Joanell Rising 05/20/2018, 9:26 AM

## 2018-05-20 NOTE — Care Management Important Message (Signed)
Important Message  Patient Details  Name: [name] MRN: [id number] Date of Birth: 22-Apr-1946   Medicare Important Message Given:  Yes    Orbie Pyo 05/20/2018, 2:34 PM

## 2018-05-20 NOTE — Progress Notes (Signed)
Physical Therapy Treatment Patient Details Name: [name] MRN: [id number] DOB: 06-25-1946 Today's Date: 05/20/2018    History of Present Illness Pt is a 72 y/o female s/p elecitve R THA, direct anterior. PMH includes DM, HTN, back surgery, and neck surgery.     PT Comments    Pt tolerated increase in ambulation distance and progression to standing hip exercises with only minor fatigue. Pt is making good progress; will review HEP and continue to correct gait deviations this afternoon.  Follow Up Recommendations  Follow surgeon's recommendation for DC plan and follow-up therapies;Supervision for mobility/OOB     Equipment Recommendations  None recommended by PT    Recommendations for Other Services OT consult     Precautions / Restrictions Precautions Precautions: Fall Restrictions Weight Bearing Restrictions: Yes RLE Weight Bearing: Weight bearing as tolerated    Mobility  Bed Mobility Overal bed mobility: Needs Assistance Bed Mobility: Supine to Sit     Supine to sit: Modified independent (Device/Increase time)        Transfers Overall transfer level: Modified independent Equipment used: Rolling walker (2 wheeled) Transfers: Sit to/from Stand Sit to Stand: Modified independent (Device/Increase time)            Ambulation/Gait Ambulation/Gait assistance: Supervision Gait Distance (Feet): 400 Feet Assistive device: Rolling walker (2 wheeled) Gait Pattern/deviations: Step-to pattern;Decreased stride length     General Gait Details: VCs for RW position, equal step length   Stairs             Wheelchair Mobility    Modified Rankin (Stroke Patients Only)       Balance Overall balance assessment: Modified Independent Sitting-balance support: No upper extremity supported;Feet supported Sitting balance-Leahy Scale: Good     Standing balance support: Bilateral upper extremity supported;During functional activity Standing balance-Leahy  Scale: Fair Standing balance comment: static stand without UE support                            Cognition Arousal/Alertness: Awake/alert Behavior During Therapy: WFL for tasks assessed/performed Overall Cognitive Status: Within Functional Limits for tasks assessed                                        Exercises Total Joint Exercises Ankle Circles/Pumps: AROM;Both;20 reps;Supine Quad Sets: AROM;Right;10 reps;Supine Short Arc Quad: AROM;Right;10 reps;Supine Heel Slides: AROM;Right;10 reps;Supine Hip ABduction/ADduction: AROM;Right;20 reps;Supine;Standing(10 reps supine, 10 reps standing) Long Arc Quad: AROM;Right;10 reps;Seated Marching in Standing: AROM;Right;10 reps;Standing    General Comments        Pertinent Vitals/Pain Pain Assessment: Faces Faces Pain Scale: Hurts a little bit Pain Location: R hip  Pain Descriptors / Indicators: Aching;Operative site guarding Pain Intervention(s): Repositioned    Home Living                      Prior Function            PT Goals (current goals can now be found in the care plan section) Acute Rehab PT Goals Patient Stated Goal: to go home  PT Goal Formulation: With patient Potential to Achieve Goals: Good Progress towards PT goals: Progressing toward goals    Frequency    7X/week      PT Plan Current plan remains appropriate    Co-evaluation  AM-PAC PT "6 Clicks" Daily Activity  Outcome Measure  Difficulty turning over in bed (including adjusting bedclothes, sheets and blankets)?: A Little Difficulty moving from lying on back to sitting on the side of the bed? : A Little Difficulty sitting down on and standing up from a chair with arms (e.g., wheelchair, bedside commode, etc,.)?: A Little Help needed moving to and from a bed to chair (including a wheelchair)?: A Little Help needed walking in hospital room?: A Little Help needed climbing 3-5 steps with a  railing? : A Lot 6 Click Score: 17    End of Session Equipment Utilized During Treatment: Gait belt Activity Tolerance: Patient tolerated treatment well Patient left: Other (comment);with call bell/phone within reach(on toilet, rehab tech on call)   PT Visit Diagnosis: Unsteadiness on feet (R26.81);Other abnormalities of gait and mobility (R26.89);Pain Pain - Right/Left: Right Pain - part of body: Hip     Time: 1120-1145 PT Time Calculation (min) (ACUTE ONLY): 25 min  Charges:  $Gait Training: 8-22 mins $Therapeutic Exercise: 8-22 mins                    G Codes:       Scarlett Presto, SPTA   Varian Innes Sheila Oats 05/20/2018, 2:51 PM

## 2018-05-21 DIAGNOSIS — I10 Essential (primary) hypertension: Secondary | ICD-10-CM | POA: Diagnosis not present

## 2018-05-21 DIAGNOSIS — M5410 Radiculopathy, site unspecified: Secondary | ICD-10-CM | POA: Diagnosis not present

## 2018-05-21 DIAGNOSIS — M48062 Spinal stenosis, lumbar region with neurogenic claudication: Secondary | ICD-10-CM | POA: Diagnosis not present

## 2018-05-21 DIAGNOSIS — J45909 Unspecified asthma, uncomplicated: Secondary | ICD-10-CM | POA: Diagnosis not present

## 2018-05-21 DIAGNOSIS — Z471 Aftercare following joint replacement surgery: Secondary | ICD-10-CM | POA: Diagnosis not present

## 2018-05-21 DIAGNOSIS — E119 Type 2 diabetes mellitus without complications: Secondary | ICD-10-CM | POA: Diagnosis not present

## 2018-05-24 ENCOUNTER — Other Ambulatory Visit: Payer: Self-pay

## 2018-05-24 DIAGNOSIS — J45909 Unspecified asthma, uncomplicated: Secondary | ICD-10-CM | POA: Diagnosis not present

## 2018-05-24 DIAGNOSIS — M5410 Radiculopathy, site unspecified: Secondary | ICD-10-CM | POA: Diagnosis not present

## 2018-05-24 DIAGNOSIS — Z471 Aftercare following joint replacement surgery: Secondary | ICD-10-CM | POA: Diagnosis not present

## 2018-05-24 DIAGNOSIS — M48062 Spinal stenosis, lumbar region with neurogenic claudication: Secondary | ICD-10-CM | POA: Diagnosis not present

## 2018-05-24 DIAGNOSIS — I10 Essential (primary) hypertension: Secondary | ICD-10-CM | POA: Diagnosis not present

## 2018-05-24 DIAGNOSIS — E119 Type 2 diabetes mellitus without complications: Secondary | ICD-10-CM | POA: Diagnosis not present

## 2018-05-24 NOTE — Patient Outreach (Signed)
[facility]) Care Management  [name]  05/24/2018   [name] 1945/12/16 [id number]  Subjective: Telephone call to patient for follow up.  Patient able to verify HIPAA. Patient reports she is doing good since her right hip surgery.  Patient reports that she only has some soreness.  Patient reports she is active with PT.  She has her mother in the home assisting her.  Patient has a follow up with the surgeon on 06/01/18.  Patient reports that her sugars continue to do well.  Patient offers no concerns today.    Objective:   Encounter Medications:  Outpatient Encounter Medications as of 05/24/2018  Medication Sig  . albuterol (PROVENTIL HFA;VENTOLIN HFA) 108 (90 Base) MCG/ACT inhaler Inhale 2 puffs into the lungs every 6 (six) hours as needed for wheezing or shortness of breath.  Marland Kitchen aspirin EC 81 MG tablet Take 1 tablet (81 mg total) by mouth 2 (two) times daily.  . bumetanide (BUMEX) 1 MG tablet Take 1 mg by mouth 2 (two) times daily.   . cetirizine (ZYRTEC) 10 MG tablet Take 10 mg by mouth daily as needed for allergies (during Spring/Summer).  . cholecalciferol (VITAMIN D) 1000 units tablet Take 1,000 Units by mouth daily.  Marland Kitchen diltiazem (CARDIZEM CD) 120 MG 24 hr capsule Take 120 mg by mouth daily before breakfast.   . enalapril (VASOTEC) 20 MG tablet Take 20 mg by mouth at bedtime.   . folic acid (FOLVITE) 259 MCG tablet Take 800 mcg by mouth daily.  Marland Kitchen gabapentin (NEURONTIN) 300 MG capsule Take 300 mg by mouth 3 (three) times daily.   Marland Kitchen lovastatin (MEVACOR) 40 MG tablet Take 40 mg by mouth at bedtime.   . Magnesium 300 MG CAPS Take 300 mg by mouth daily.   . metFORMIN (GLUCOPHAGE-XR) 500 MG 24 hr tablet Take 500-1,000 mg by mouth 2 (two) times daily. Take 1 tablet (500 mg) in the morning and take 2 tablets (1000 mg) with supper  . methotrexate (RHEUMATREX) 2.5 MG tablet Take 12.5 mg by mouth every Sunday at [place]. Protect from light.   .  Multiple Vitamin (MULTIVITAMIN WITH MINERALS) TABS tablet Take 1 tablet by mouth daily.  Marland Kitchen oxyCODONE-acetaminophen (PERCOCET/ROXICET) 5-325 MG tablet Take 1 tablet by mouth every 4 (four) hours as needed for severe pain.  . ranitidine (ZANTAC) 300 MG tablet Take 300 mg by mouth 2 (two) times daily. Lunch & supper  . sodium chloride (OCEAN) 0.65 % SOLN nasal spray Place 1-2 sprays into the nose 4 (four) times daily as needed for congestion.  Marland Kitchen tiZANidine (ZANAFLEX) 2 MG tablet Take 1 tablet (2 mg total) by mouth every 6 (six) hours as needed.   No facility-administered encounter medications on file as of 05/24/2018.     Functional Status:  In your present state of health, do you have any difficulty performing the following activities: 05/19/2018 05/19/2018  Hearing? - N  Vision? - N  Difficulty concentrating or making decisions? - N  Walking or climbing stairs? - Y  Dressing or bathing? - N  Doing errands, shopping? Y -  Conservation officer, nature and eating ? - -  Using the Toilet? - -  In the past six months, have you accidently leaked urine? - -  Do you have problems with loss of bowel control? - -  Managing your Medications? - -  Managing your Finances? - -  Housekeeping or managing your Housekeeping? - -  Some recent data might be hidden  Fall/Depression Screening: Fall Risk  02/03/2018 12/29/2017 12/01/2017  Falls in the past year? Yes Yes Yes  Number falls in past yr: - 1 1  Injury with Fall? - - -  Follow up - - -   PHQ 2/9 Scores 02/03/2018 12/29/2017 12/01/2017 08/13/2017 06/29/2017 05/27/2017 05/04/2017  PHQ - 2 Score 0 0 0 0 0 0 0    Assessment: Patient continues to benefit from care manager outreach for disease management and support.    Plan:  [facility] CM Care Plan Problem One     Most Recent Value  Care Plan Problem One  Knowledge Deficit Diabetes  Role Documenting the Problem One  Care Management Telephonic Coordinator  Care Plan for Problem One  Active  THN Long Term Goal   Patient will  report A1c 5.5 or less within 90 days.  THN Long Term Goal Start Date  05/24/18  Interventions for Problem One Long Term Goal  Patient monitoring sugars daily and watching diet.      [facility] CM Care Plan Problem Two     Most Recent Value  Care Plan Problem Two  Recent Hip surgery  Role Documenting the Problem Two  Care Management Telephonic Coordinator  Care Plan for Problem Two  Active  Interventions for Problem Two Long Term Goal   Patient watching surgical site for redness and swelling.  Patient activity as tolerated.  RN CM discussed importance of timely follow up.    THN Long Term Goal  Patient will not readmit to the hospital within 31 days.  THN Long Term Goal Start Date  05/24/18     RN CM will contact patient in the month of July and patient agrees to next outreach.  Jone Baseman, RN, MSN [facility] Care Management Care Management Coordinator Direct Line [phone] Toll Free: [phone]  Fax: [phone]

## 2018-05-25 ENCOUNTER — Ambulatory Visit: Payer: Self-pay

## 2018-05-25 DIAGNOSIS — I10 Essential (primary) hypertension: Secondary | ICD-10-CM | POA: Diagnosis not present

## 2018-05-25 DIAGNOSIS — J45909 Unspecified asthma, uncomplicated: Secondary | ICD-10-CM | POA: Diagnosis not present

## 2018-05-25 DIAGNOSIS — M5410 Radiculopathy, site unspecified: Secondary | ICD-10-CM | POA: Diagnosis not present

## 2018-05-25 DIAGNOSIS — E119 Type 2 diabetes mellitus without complications: Secondary | ICD-10-CM | POA: Diagnosis not present

## 2018-05-25 DIAGNOSIS — Z471 Aftercare following joint replacement surgery: Secondary | ICD-10-CM | POA: Diagnosis not present

## 2018-05-25 DIAGNOSIS — M48062 Spinal stenosis, lumbar region with neurogenic claudication: Secondary | ICD-10-CM | POA: Diagnosis not present

## 2018-05-27 DIAGNOSIS — Z471 Aftercare following joint replacement surgery: Secondary | ICD-10-CM | POA: Diagnosis not present

## 2018-05-27 DIAGNOSIS — E119 Type 2 diabetes mellitus without complications: Secondary | ICD-10-CM | POA: Diagnosis not present

## 2018-05-27 DIAGNOSIS — I10 Essential (primary) hypertension: Secondary | ICD-10-CM | POA: Diagnosis not present

## 2018-05-27 DIAGNOSIS — M48062 Spinal stenosis, lumbar region with neurogenic claudication: Secondary | ICD-10-CM | POA: Diagnosis not present

## 2018-05-27 DIAGNOSIS — M5410 Radiculopathy, site unspecified: Secondary | ICD-10-CM | POA: Diagnosis not present

## 2018-05-27 DIAGNOSIS — J45909 Unspecified asthma, uncomplicated: Secondary | ICD-10-CM | POA: Diagnosis not present

## 2018-05-30 DIAGNOSIS — I10 Essential (primary) hypertension: Secondary | ICD-10-CM | POA: Diagnosis not present

## 2018-05-30 DIAGNOSIS — Z471 Aftercare following joint replacement surgery: Secondary | ICD-10-CM | POA: Diagnosis not present

## 2018-05-30 DIAGNOSIS — M48062 Spinal stenosis, lumbar region with neurogenic claudication: Secondary | ICD-10-CM | POA: Diagnosis not present

## 2018-05-30 DIAGNOSIS — E119 Type 2 diabetes mellitus without complications: Secondary | ICD-10-CM | POA: Diagnosis not present

## 2018-05-30 DIAGNOSIS — J45909 Unspecified asthma, uncomplicated: Secondary | ICD-10-CM | POA: Diagnosis not present

## 2018-05-30 DIAGNOSIS — M5410 Radiculopathy, site unspecified: Secondary | ICD-10-CM | POA: Diagnosis not present

## 2018-06-01 DIAGNOSIS — M1611 Unilateral primary osteoarthritis, right hip: Secondary | ICD-10-CM | POA: Diagnosis not present

## 2018-06-01 DIAGNOSIS — M25552 Pain in left hip: Secondary | ICD-10-CM | POA: Diagnosis not present

## 2018-06-01 DIAGNOSIS — Z96641 Presence of right artificial hip joint: Secondary | ICD-10-CM | POA: Diagnosis not present

## 2018-06-07 ENCOUNTER — Ambulatory Visit: Payer: Self-pay

## 2018-06-08 ENCOUNTER — Other Ambulatory Visit: Payer: Self-pay

## 2018-06-08 ENCOUNTER — Other Ambulatory Visit: Payer: Self-pay | Admitting: Orthopedic Surgery

## 2018-06-08 NOTE — Patient Outreach (Signed)
[facility]) Care Management  [place]  06/08/2018   [name] Feb 25, 1946 [id number]  Subjective: Telephone call to patient for follow up call. Patient reports that she is doing good with her right hip.  She states that she is walking and exercising.  She states that she has no pain.  Patient shares they she may have left hip done in August if all goes well.  Patient reports that her sugars are doing well and that the highest sugar was 115 in the morning.  Discussed with patient the importance of diet and exercise.  She verbalized understanding.    Objective:   Encounter Medications:  Outpatient Encounter Medications as of 06/08/2018  Medication Sig  . albuterol (PROVENTIL HFA;VENTOLIN HFA) 108 (90 Base) MCG/ACT inhaler Inhale 2 puffs into the lungs every 6 (six) hours as needed for wheezing or shortness of breath.  Marland Kitchen aspirin EC 81 MG tablet Take 1 tablet (81 mg total) by mouth 2 (two) times daily.  . bumetanide (BUMEX) 1 MG tablet Take 1 mg by mouth 2 (two) times daily.   . cetirizine (ZYRTEC) 10 MG tablet Take 10 mg by mouth daily as needed for allergies (during Spring/Summer).  . cholecalciferol (VITAMIN D) 1000 units tablet Take 1,000 Units by mouth daily.  Marland Kitchen diltiazem (CARDIZEM CD) 120 MG 24 hr capsule Take 120 mg by mouth daily before breakfast.   . enalapril (VASOTEC) 20 MG tablet Take 20 mg by mouth at bedtime.   . folic acid (FOLVITE) 941 MCG tablet Take 800 mcg by mouth daily.  Marland Kitchen gabapentin (NEURONTIN) 300 MG capsule Take 300 mg by mouth 3 (three) times daily.   Marland Kitchen lovastatin (MEVACOR) 40 MG tablet Take 40 mg by mouth at bedtime.   . Magnesium 300 MG CAPS Take 300 mg by mouth daily.   . metFORMIN (GLUCOPHAGE-XR) 500 MG 24 hr tablet Take 500-1,000 mg by mouth 2 (two) times daily. Take 1 tablet (500 mg) in the morning and take 2 tablets (1000 mg) with supper  . methotrexate (RHEUMATREX) 2.5 MG tablet Take 12.5 mg by mouth every Sunday at [place]. Protect from light.   . Multiple Vitamin (MULTIVITAMIN WITH MINERALS) TABS tablet Take 1 tablet by mouth daily.  Marland Kitchen oxyCODONE-acetaminophen (PERCOCET/ROXICET) 5-325 MG tablet Take 1 tablet by mouth every 4 (four) hours as needed for severe pain.  . ranitidine (ZANTAC) 300 MG tablet Take 300 mg by mouth 2 (two) times daily. Lunch & supper  . sodium chloride (OCEAN) 0.65 % SOLN nasal spray Place 1-2 sprays into the nose 4 (four) times daily as needed for congestion.  Marland Kitchen tiZANidine (ZANAFLEX) 2 MG tablet Take 1 tablet (2 mg total) by mouth every 6 (six) hours as needed.   No facility-administered encounter medications on file as of 06/08/2018.     Functional Status:  In your present state of health, do you have any difficulty performing the following activities: 05/19/2018 05/19/2018  Hearing? - N  Vision? - N  Difficulty concentrating or making decisions? - N  Walking or climbing stairs? - Y  Dressing or bathing? - N  Doing errands, shopping? Y -  Conservation officer, nature and eating ? - -  Using the Toilet? - -  In the past six months, have you accidently leaked urine? - -  Do you have problems with loss of bowel control? - -  Managing your Medications? - -  Managing your Finances? - -  Housekeeping or managing your Housekeeping? - -  Some recent data might be hidden    Fall/Depression Screening: Fall Risk  02/03/2018 12/29/2017 12/01/2017  Falls in the past year? Yes Yes Yes  Number falls in past yr: - 1 1  Injury with Fall? - - -  Follow up - - -   PHQ 2/9 Scores 02/03/2018 12/29/2017 12/01/2017 08/13/2017 06/29/2017 05/27/2017 05/04/2017  PHQ - 2 Score 0 0 0 0 0 0 0    Assessment: Patient continues to benefit from care manager outreach for disease management and support.    Plan: [facility] CM Care Plan Problem One     Most Recent Value  Care Plan Problem One  Knowledge Deficit Diabetes  Role Documenting the Problem One  Care Management Telephonic Coordinator  Care Plan for Problem  One  Active  THN Long Term Goal   Patient will report A1c 5.5 or less within 90 days.  THN Long Term Goal Start Date  05/24/18  Interventions for Problem One Long Term Goal  Patient continues to monitor sugars regularly.  Highest sugar 115.      [facility] CM Care Plan Problem Two     Most Recent Value  Care Plan Problem Two  Recent Hip surgery  Role Documenting the Problem Two  Care Management Telephonic Coordinator  Care Plan for Problem Two  Active  Interventions for Problem Two Long Term Goal   Patient reports that surgial site is healed and that she is exercising and walking regularly.    THN Long Term Goal  Patient will not readmit to the hospital within 31 days.  THN Long Term Goal Start Date  05/24/18     RN CM will contact patient in the month of August and patient agrees to next outreach.    Jone Baseman, RN, MSN [facility] Care Management Care Management Coordinator Direct Line [phone] Toll Free: [phone]  Fax: [phone]

## 2018-06-14 DIAGNOSIS — E1122 Type 2 diabetes mellitus with diabetic chronic kidney disease: Secondary | ICD-10-CM | POA: Diagnosis not present

## 2018-06-14 DIAGNOSIS — E7800 Pure hypercholesterolemia, unspecified: Secondary | ICD-10-CM | POA: Diagnosis not present

## 2018-06-21 DIAGNOSIS — I10 Essential (primary) hypertension: Secondary | ICD-10-CM | POA: Diagnosis not present

## 2018-06-21 DIAGNOSIS — K219 Gastro-esophageal reflux disease without esophagitis: Secondary | ICD-10-CM | POA: Diagnosis not present

## 2018-06-21 DIAGNOSIS — E1142 Type 2 diabetes mellitus with diabetic polyneuropathy: Secondary | ICD-10-CM | POA: Diagnosis not present

## 2018-06-21 DIAGNOSIS — E785 Hyperlipidemia, unspecified: Secondary | ICD-10-CM | POA: Diagnosis not present

## 2018-06-23 ENCOUNTER — Other Ambulatory Visit: Payer: Self-pay

## 2018-06-23 NOTE — Patient Outreach (Signed)
[place] [facility]) Care Management  [place]  06/23/2018   [name] May 29, 1946 [id number]  Subjective: Telephone call to patient for follow up call.  Patient able to verify HIPAA. Patient reports that she is doing ok.  She has her left hip surgery done on 07-06-18.  She states that her left hip just aches so much.  Right hip continues to heal. Saw PCP recently and she reports that visit went well. She says her cholesterol was good and that her A1c was 5.7.  Encouraged patient to continue to watch her diet being that she is not as active due to hip issues. She verbalized understanding.     Objective:   Encounter Medications:  Outpatient Encounter Medications as of 06/23/2018  Medication Sig  . acetaminophen (TYLENOL) 500 MG tablet Take 1,000 mg by mouth daily as needed for moderate pain or headache.  . albuterol (PROVENTIL HFA;VENTOLIN HFA) 108 (90 Base) MCG/ACT inhaler Inhale 2 puffs into the lungs every 6 (six) hours as needed for wheezing or shortness of breath.  Marland Kitchen aspirin EC 81 MG tablet Take 1 tablet (81 mg total) by mouth 2 (two) times daily.  . bumetanide (BUMEX) 1 MG tablet Take 1 mg by mouth 2 (two) times daily.   . cetirizine (ZYRTEC) 10 MG tablet Take 10 mg by mouth daily as needed for allergies (during Spring/Summer).  . cholecalciferol (VITAMIN D) 1000 units tablet Take 1,000 Units by mouth daily.  . diclofenac (VOLTAREN) 75 MG EC tablet Take 75 mg by mouth 2 (two) times daily.  Marland Kitchen diltiazem (CARDIZEM CD) 120 MG 24 hr capsule Take 120 mg by mouth daily before breakfast.   . docusate sodium (COLACE) 100 MG capsule Take 100 mg by mouth every other day.  . enalapril (VASOTEC) 20 MG tablet Take 20 mg by mouth at bedtime.   . folic acid (FOLVITE) 478 MCG tablet Take 800 mcg by mouth daily.  Marland Kitchen gabapentin (NEURONTIN) 300 MG capsule Take 300 mg by mouth 3 (three) times daily.   Marland Kitchen lovastatin (MEVACOR) 40 MG tablet Take 40 mg by mouth at bedtime.   . metFORMIN  (GLUCOPHAGE-XR) 500 MG 24 hr tablet Take 500-1,000 mg by mouth See admin instructions. Take 1 tablet (500 mg) in the morning and take 2 tablets (1000 mg) with supper  . methotrexate (RHEUMATREX) 2.5 MG tablet Take 12.5 mg by mouth every Sunday at [place]. Protect from light.   . Multiple Vitamin (MULTIVITAMIN WITH MINERALS) TABS tablet Take 1 tablet by mouth daily.  Marland Kitchen oxyCODONE-acetaminophen (PERCOCET/ROXICET) 5-325 MG tablet Take 1 tablet by mouth every 4 (four) hours as needed for severe pain.  . ranitidine (ZANTAC) 300 MG tablet Take 300 mg by mouth 2 (two) times daily.   . sodium chloride (OCEAN) 0.65 % SOLN nasal spray Place 1-2 sprays into the nose 4 (four) times daily as needed for congestion.  Marland Kitchen tiZANidine (ZANAFLEX) 2 MG tablet Take 1 tablet (2 mg total) by mouth every 6 (six) hours as needed.   No facility-administered encounter medications on file as of 06/23/2018.     Functional Status:  In your present state of health, do you have any difficulty performing the following activities: 05/19/2018 05/19/2018  Hearing? - N  Vision? - N  Difficulty concentrating or making decisions? - N  Walking or climbing stairs? - Y  Dressing or bathing? - N  Doing errands, shopping? Y -  Conservation officer, nature and eating ? - -  Using the Toilet? - -  In the past six months, have you accidently leaked urine? - -  Do you have problems with loss of bowel control? - -  Managing your Medications? - -  Managing your Finances? - -  Housekeeping or managing your Housekeeping? - -  Some recent data might be hidden    Fall/Depression Screening: Fall Risk  06/08/2018 02/03/2018 12/29/2017  Falls in the past year? Yes Yes Yes  Number falls in past yr: - - 1  Injury with Fall? - - -  Follow up - - -   PHQ 2/9 Scores 06/08/2018 02/03/2018 12/29/2017 12/01/2017 08/13/2017 06/29/2017 05/27/2017  PHQ - 2 Score 0 0 0 0 0 0 0    Assessment: Patient continues to benefit from care manager outreach for disease  management and support.    Plan:  [facility] CM Care Plan Problem One     Most Recent Value  Care Plan Problem One  Knowledge Deficit Diabetes  Role Documenting the Problem One  Care Management Telephonic Coordinator  Care Plan for Problem One  Active  [facility] Long Term Goal   Patient will report A1c 5.5 or less within 90 days.  [facility] Long Term Goal Start Date  06/23/18  Interventions for Problem One Long Term Goal  RN CM reiterated with patient importance of diet control with diabetes.      [facility] CM Care Plan Problem Two     Most Recent Value  Care Plan Problem Two  Recent Hip surgery  Role Documenting the Problem Two  Care Management Telephonic Coordinator  Care Plan for Problem Two  Active  [facility] Long Term Goal  Patient will not readmit to the hospital within 31 days.  [facility] Long Term Goal Start Date  05/24/18  [facility] Long Term Goal Met Date  06/23/18     RN CM will contact patient again in the month of August and patient agrees to next outreach.

## 2018-06-24 ENCOUNTER — Encounter (HOSPITAL_COMMUNITY)
Admission: RE | Admit: 2018-06-24 | Discharge: 2018-06-24 | Disposition: A | Discharge status: Home / Self Care | Payer: Medicare HMO | Source: Ambulatory Visit | Attending: Orthopedic Surgery | Admitting: Orthopedic Surgery

## 2018-06-24 ENCOUNTER — Other Ambulatory Visit: Payer: Self-pay

## 2018-06-24 ENCOUNTER — Encounter (HOSPITAL_COMMUNITY): Payer: Self-pay

## 2018-06-24 DIAGNOSIS — Z01812 Encounter for preprocedural laboratory examination: Secondary | ICD-10-CM | POA: Diagnosis not present

## 2018-06-24 HISTORY — DX: Osteoarthritis of hip, unspecified: M16.9

## 2018-06-24 LAB — URINALYSIS, ROUTINE W REFLEX MICROSCOPIC
BILIRUBIN URINE: NEGATIVE
GLUCOSE, UA: NEGATIVE mg/dL
HGB URINE DIPSTICK: NEGATIVE
KETONES UR: NEGATIVE mg/dL
Leukocytes, UA: NEGATIVE
Nitrite: NEGATIVE
PROTEIN: NEGATIVE mg/dL
Specific Gravity, Urine: 1.005 (ref 1.005–1.030)
pH: 7 (ref 5.0–8.0)

## 2018-06-24 LAB — BASIC METABOLIC PANEL
Anion gap: 12 (ref 5–15)
BUN: 16 mg/dL (ref 8–23)
CO2: 30 mmol/L (ref 22–32)
Calcium: 10.1 mg/dL (ref 8.9–10.3)
Chloride: 102 mmol/L (ref 98–111)
Creatinine, Ser: 0.84 mg/dL (ref 0.44–1.00)
Glucose, Bld: 109 mg/dL — ABNORMAL HIGH (ref 70–99)
POTASSIUM: 3.3 mmol/L — AB (ref 3.5–5.1)
SODIUM: 144 mmol/L (ref 135–145)

## 2018-06-24 LAB — CBC WITH DIFFERENTIAL/PLATELET
Abs Immature Granulocytes: 0 10*3/uL (ref 0.0–0.1)
BASOS ABS: 0.1 10*3/uL (ref 0.0–0.1)
Basophils Relative: 1 %
EOS ABS: 0.2 10*3/uL (ref 0.0–0.7)
EOS PCT: 2 %
HCT: 34.6 % — ABNORMAL LOW (ref 36.0–46.0)
HEMOGLOBIN: 10.6 g/dL — AB (ref 12.0–15.0)
Immature Granulocytes: 0 %
LYMPHS PCT: 24 %
Lymphs Abs: 2 10*3/uL (ref 0.7–4.0)
MCH: 26.8 pg (ref 26.0–34.0)
MCHC: 30.6 g/dL (ref 30.0–36.0)
MCV: 87.4 fL (ref 78.0–100.0)
Monocytes Absolute: 0.5 10*3/uL (ref 0.1–1.0)
Monocytes Relative: 6 %
NEUTROS PCT: 67 %
Neutro Abs: 5.6 10*3/uL (ref 1.7–7.7)
Platelets: 427 10*3/uL — ABNORMAL HIGH (ref 150–400)
RBC: 3.96 MIL/uL (ref 3.87–5.11)
RDW: 18.7 % — AB (ref 11.5–15.5)
WBC: 8.4 10*3/uL (ref 4.0–10.5)

## 2018-06-24 LAB — SURGICAL PCR SCREEN
MRSA, PCR: NEGATIVE
Staphylococcus aureus: POSITIVE — AB

## 2018-06-24 LAB — GLUCOSE, CAPILLARY: GLUCOSE-CAPILLARY: 126 mg/dL — AB (ref 70–99)

## 2018-06-24 LAB — APTT: APTT: 29 s (ref 24–36)

## 2018-06-24 LAB — PROTIME-INR
INR: 0.88
PROTHROMBIN TIME: 11.9 s (ref 11.4–15.2)

## 2018-06-24 NOTE — Progress Notes (Signed)
PCP - Dr. Jani Gravel  Cardiologist - Denies  Chest x-ray - 05/09/18 (E)  EKG - 01/28/18 (E)  Stress Test - 12/19/12 (E)  ECHO - 10/24/03 (E)  Cardiac Cath - 1998 - neg  Sleep Study - Yes- Negative CPAP - None  LABS- 06/24/18: CBC w/D, BMP, PT, PTT, UA, PCR  ASA- LD- 8/7  HA1C- 05/18/18: 5.8 (E) Fasting Blood Sugar - 95-111, Today, 126 Checks Blood Sugar ___3__ times a week  Anesthesia-No  Pt denies having chest pain, sob, or fever at this time. All instructions explained to the pt, with a verbal understanding of the material. Pt agrees to go over the instructions while at home for a better understanding. The opportunity to ask questions was provided.

## 2018-06-24 NOTE — Pre-Procedure Instructions (Signed)
[name]  06/24/2018      [facility] [place], [address] AT [address] [place] [address] Phone: [phone] Fax: [phone]  [facility] [place], [address] [place] [address] Phone: [phone] Fax: [phone]    Your procedure is scheduled on Wed., Aug. 14, 2019 from 12:45PM-2:45PM  Report to [facility] Admitting Entrance "A" at 10:45AM  Call this number if you have problems the morning of surgery:  [phone]   Remember:  Do not eat or drink after midnight on Aug. 13th    Take these medicines the morning of surgery with A SIP OF WATER: Diltiazem (CARDIZEM CD), Gabapentin (NEURONTIN), and Ranitidine (ZANTAC)   If needed Acetaminophen (TYLENOL), Cetirizine (ZYRTEC), OxyCODONE-acetaminophen (PERCOCET/ROXICET), Sodium chloride (OCEAN) nasal spray, and Albuterol Inhaler (Bring with you the day of surgery)  Follow your surgeon's instructions on when to stop Aspirin.  If no instructions were given by your surgeon then you will need to call the office to get those instructions.    7 days before surgery (8/7), stop taking all Other Aspirin Products, Vitamins, Fish oils, and Herbal medications. Also stop all NSAIDS i.e. Advil, Ibuprofen, Motrin, Aleve, Anaprox, Naproxen, BC, Goody Powders, and all Supplements. Including: Diclofenac (VOLTAREN)  How to Manage Your Diabetes Before and After Surgery  Why is it important to control my blood sugar before and after surgery? . Improving blood sugar levels before and after surgery helps healing and can limit problems. . A way of improving blood sugar control is eating a healthy diet by: o  Eating less sugar and carbohydrates o  Increasing activity/exercise o  Talking with your doctor about reaching your blood sugar goals . High blood sugars (greater than 180 mg/dL) can  raise your risk of infections and slow your recovery, so you will need to focus on controlling your diabetes during the weeks before surgery. . Make sure that the doctor who takes care of your diabetes knows about your planned surgery including the date and location.  How do I manage my blood sugar before surgery? . Check your blood sugar at least 4 times a day, starting 2 days before surgery, to make sure that the level is not too high or low. o Check your blood sugar the morning of your surgery when you wake up and every 2 hours until you get to the Short Stay unit. . If your blood sugar is less than 70 mg/dL, you will need to treat for low blood sugar: o Do not take insulin. o Treat a low blood sugar (less than 70 mg/dL) with  cup of clear juice (cranberry or apple), 4 glucose tablets, OR glucose gel. Recheck blood sugar in 15 minutes after treatment (to make sure it is greater than 70 mg/dL). If your blood sugar is not greater than 70 mg/dL on recheck, call [phone] o  for further instructions. . Report your blood sugar to the short stay nurse when you get to Short Stay. . If your CBG is greater than 220 mg/dL, call the number above for further instructions.  . If you are admitted to the hospital after surgery: o Your blood sugar will be checked by the staff and you will probably be given insulin after surgery (instead of oral diabetes medicines) to make sure you have good blood sugar levels. o  The goal for blood sugar control after surgery is 80-180 mg/dL.  WHAT DO I DO ABOUT MY DIABETES MEDICATION?  Marland Kitchen Do not take MetFORMIN (GLUCOPHAGE-XR) the morning of surgery.  Reviewed and Endorsed by [facility] Patient Education Committee, August 2015   Do not wear jewelry, make-up or nail polish.  Do not wear lotions, powders, or perfumes, or deodorant.  Do not shave 48 hours prior to surgery.    Do not bring valuables to the hospital.  [facility] is not responsible for any belongings or  valuables.  Contacts, dentures or bridgework may not be worn into surgery.  Leave your suitcase in the car.  After surgery it may be brought to your room.  For patients admitted to the hospital, discharge time will be determined by your treatment team.  Patients discharged the day of surgery will not be allowed to drive home.   Special instructions:   [place]- Preparing For Surgery  Before surgery, you can play an important role. Because skin is not sterile, your skin needs to be as free of germs as possible. You can reduce the number of germs on your skin by washing with CHG (chlorahexidine gluconate) Soap before surgery.  CHG is an antiseptic cleaner which kills germs and bonds with the skin to continue killing germs even after washing.    Oral Hygiene is also important to reduce your risk of infection.  Remember - BRUSH YOUR TEETH THE MORNING OF SURGERY WITH YOUR REGULAR TOOTHPASTE  Please do not use if you have an allergy to CHG or antibacterial soaps. If your skin becomes reddened/irritated stop using the CHG.  Do not shave (including legs and underarms) for at least 48 hours prior to first CHG shower. It is OK to shave your face.  Please follow these instructions carefully.   1. Shower the NIGHT BEFORE SURGERY and the MORNING OF SURGERY with CHG.   2. If you chose to wash your hair, wash your hair first as usual with your normal shampoo.  3. After you shampoo, rinse your hair and body thoroughly to remove the shampoo.  4. Use CHG as you would any other liquid soap. You can apply CHG directly to the skin and wash gently with a scrungie or a clean washcloth.   5. Apply the CHG Soap to your body ONLY FROM THE NECK DOWN.  Do not use on open wounds or open sores. Avoid contact with your eyes, ears, mouth and genitals (private parts). Wash Face and genitals (private parts)  with your normal soap.  6. Wash thoroughly, paying special attention to the area where your surgery will be  performed.  7. Thoroughly rinse your body with warm water from the neck down.  8. DO NOT shower/wash with your normal soap after using and rinsing off the CHG Soap.  9. Pat yourself dry with a CLEAN TOWEL.  10. Wear CLEAN PAJAMAS to bed the night before surgery, wear comfortable clothes the morning of surgery  11. Place CLEAN SHEETS on your bed the night of your first shower and DO NOT SLEEP WITH PETS.  Day of Surgery:  Do not apply any deodorants/lotions.  Please wear clean clothes to the hospital/surgery center.   Remember to brush your teeth WITH YOUR REGULAR TOOTHPASTE.  Please read over the following fact sheets that you were given. Pain Booklet, Coughing and Deep Breathing, MRSA Information and Surgical Site Infection Prevention

## 2018-06-24 NOTE — Progress Notes (Signed)
Pt made aware of nasal screen positive for Staph. Pt will start using the Mupirocin ointment on 8/9- 8/13.

## 2018-06-26 IMAGING — XA DG MYELOGRAPHY LUMBAR INJ LUMBOSACRAL
13 of 17 series · 13 of 17 positions shown · non-contrast
Comparison: MRI of the lumbar spine 09/09/2017

CLINICAL DATA: Low back pain extending into the lower extremities
bilaterally. Pain was previously mostly on the left. It is now
mostly into the right lower extremity along the lateral aspect of
the thigh to the knee.
TECHNIQUE: Contiguous axial images were obtained through the Lumbar spine after
the intrathecal infusion of infusion. Coronal and sagittal
reconstructions were obtained of the axial image sets.

[Series 1: vasc standard · 1 of 1 slices shown (1 of 11)]
[im 1/1]
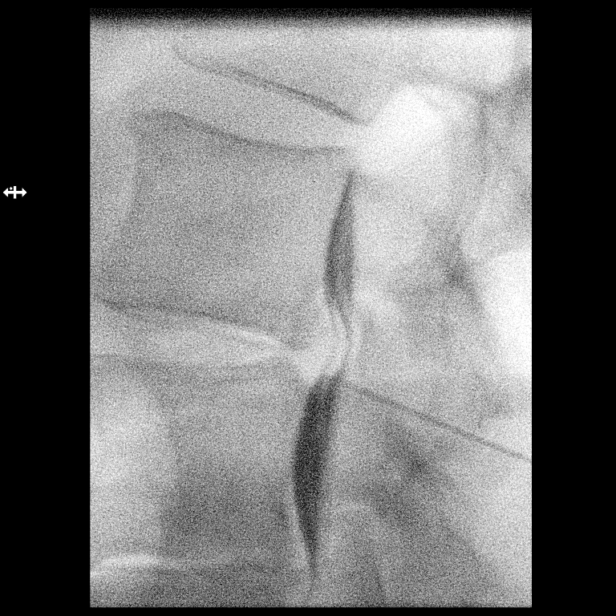

[Series 1: w lumbar spine lat · 0.15mm/px · 1 of 1 slices shown]
[im 1/1]
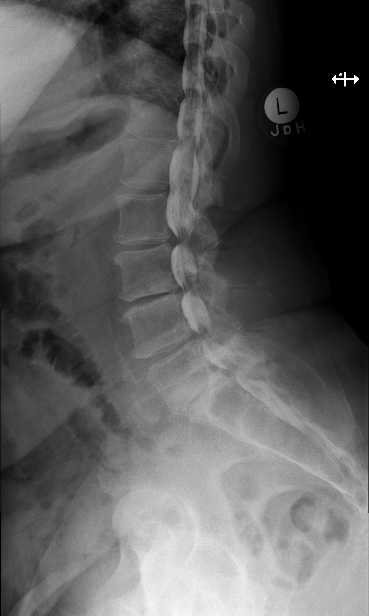

[Series 2: vasc standard · 1 of 1 slices shown (2 of 11)]
[im 1/1]
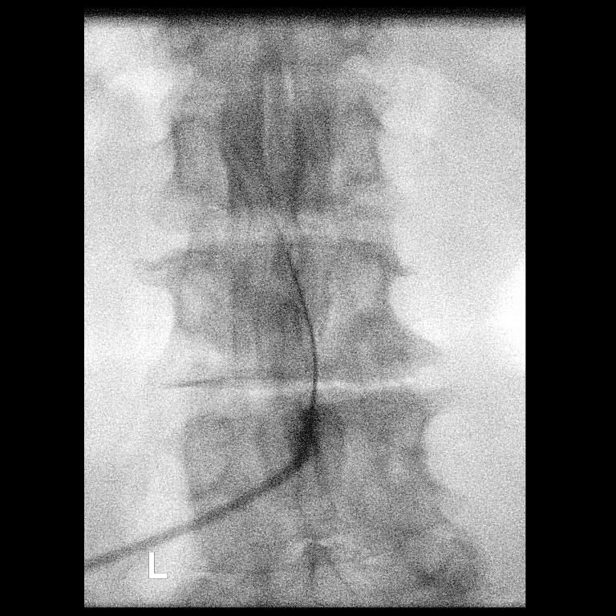

[Series 3: w lumbar spine extension · 0.15mm/px · 1 of 1 slices shown]
[im 1/1]
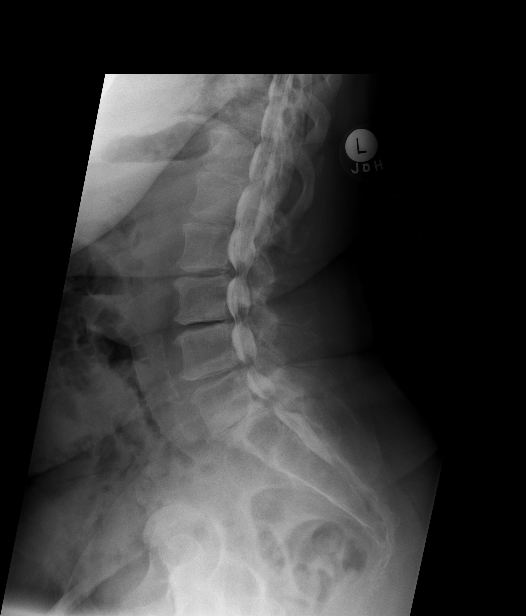

[Series 3: vasc standard · 1 of 1 slices shown (3 of 11)]
[im 1/1]
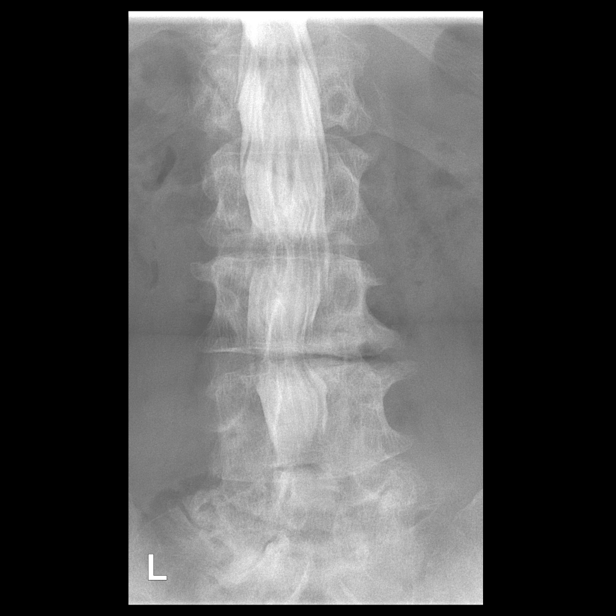

[Series 5: vasc standard · 1 of 1 slices shown (4 of 11)]
[im 1/1]
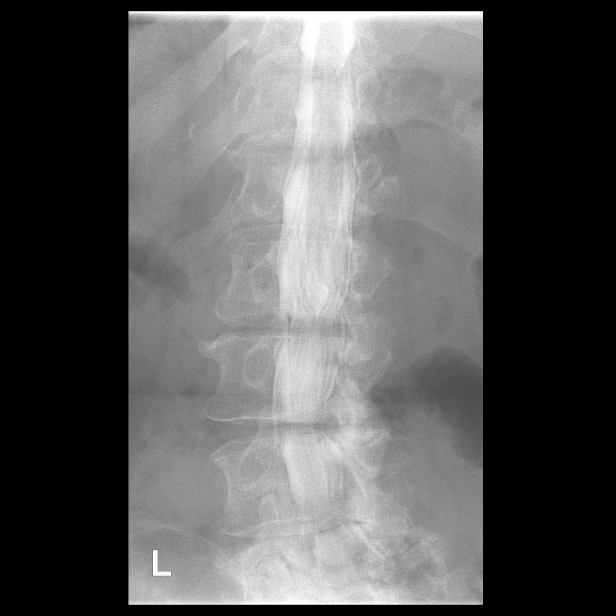

[Series 6: vasc standard · 1 of 1 slices shown (5 of 11)]
[im 1/1]
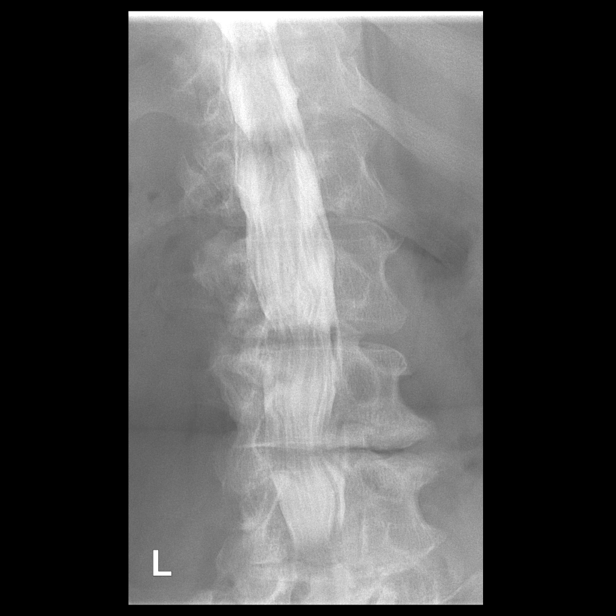

[Series 7: vasc standard · 1 of 1 slices shown (6 of 11)]
[im 1/1]
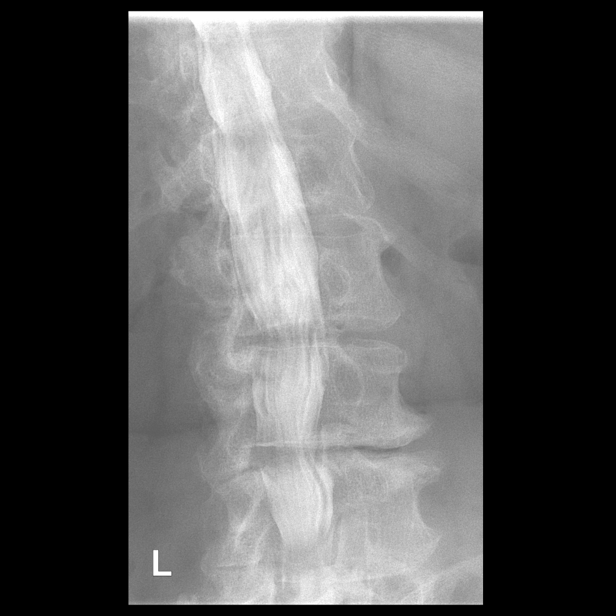

[Series 10: vasc standard · 1 of 1 slices shown (7 of 11)]
[im 1/1]
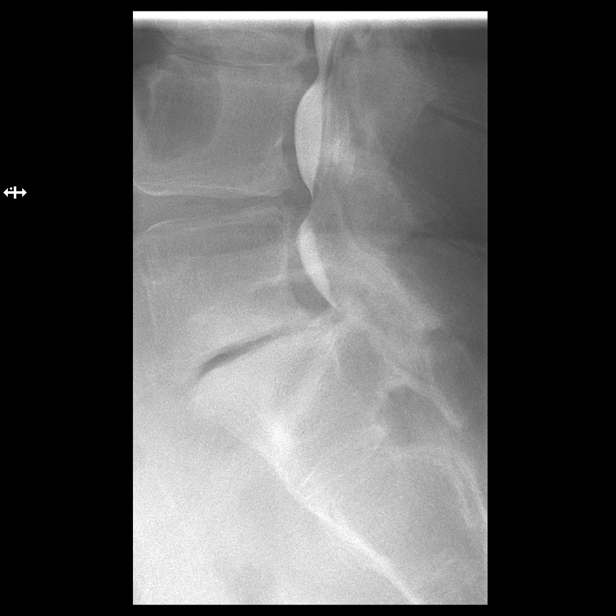

[Series 11: vasc standard · 1 of 1 slices shown (8 of 11)]
[im 1/1]
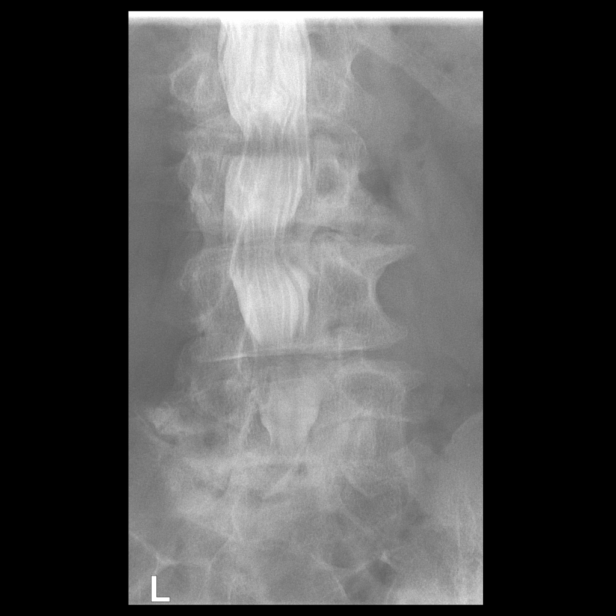

[Series 12: vasc standard · 1 of 1 slices shown (9 of 11)]
[im 1/1]
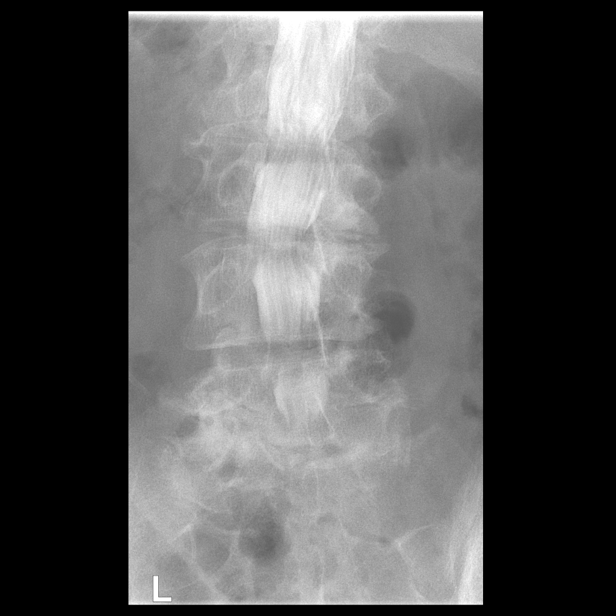

[Series 14: vasc standard · 1 of 1 slices shown (10 of 11)]
[im 1/1]
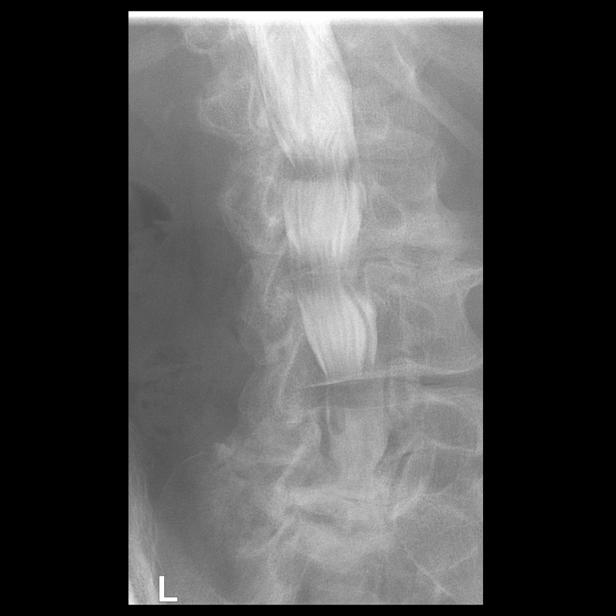

[Series 15: vasc standard · 1 of 1 slices shown (11 of 11)]
[im 1/1]
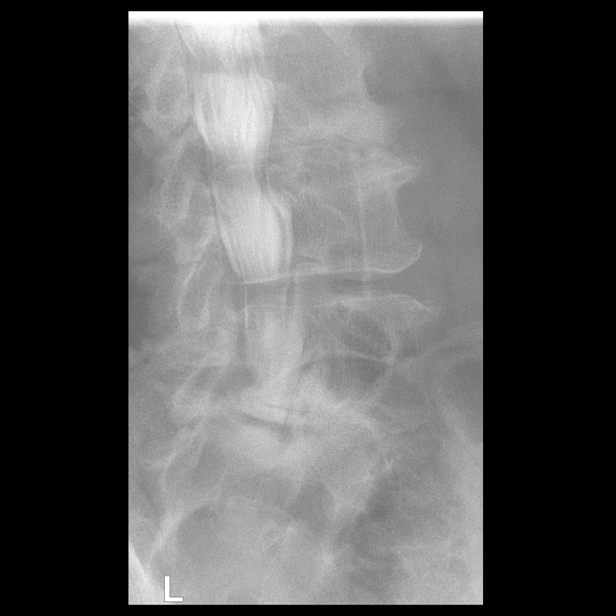

[13 of 17 positions shown; findings below may reference images not displayed]

EXAM:
LUMBAR MYELOGRAM

FLUOROSCOPY TIME:  Radiation Exposure Index (as provided by the
fluoroscopic device): 144.78 uGy*m2

Fluoroscopy Time:  48 seconds

Number of Acquired Images:  17

PROCEDURE:
After thorough discussion of risks and benefits of the procedure
including bleeding, infection, injury to nerves, blood vessels,
adjacent structures as well as headache and CSF leak, written and
oral informed consent was obtained. Consent was obtained by Dr.
Leinad Nickens. Time out form was completed.

Patient was positioned prone on the fluoroscopy table. Local
anesthesia was provided with 1% lidocaine without epinephrine after
prepped and draped in the usual sterile fashion. Puncture was
performed at L1-2 using a 3 1/2 inch 22-gauge spinal needle via
right paramedian approach. Using a single pass through the dura, the
needle was placed within the thecal sac, with return of clear CSF.
15 mL of Isovue 5-[id number] was injected into the thecal sac, with normal
opacification of the nerve roots and cauda equina consistent with
free flow within the subarachnoid space.

I personally performed the lumbar puncture and administered the
intrathecal contrast. I also personally supervised acquisition of
the myelogram images.
FINDINGS: LUMBAR MYELOGRAM FINDINGS:

Comparison with prior chest x-rays, there are only 4 lumbar type
vertebral bodies. This represents a change in numbering system
compared to the prior exam.

Focal leftward curvature is present at L2-3 and rightward curvature
at L3-4.

A broad-based disc protrusion is present at L1-2. Subarticular
narrowing is present bilaterally, left greater than right.

Asymmetric endplate change and disc disease is present at L2-3 with
subarticular narrowing, right greater than left.

A broad-based disc protrusion is present at L3-4. There is moderate
to severe central canal stenosis. Subarticular narrowing is worse on
the left.

Grade 1 anterolisthesis is present at L4-S1. This does not change
significantly with flexion or extension. There is uncovering of a
broad-based disc protrusion with moderate central canal stenosis

CT LUMBAR MYELOGRAM FINDINGS:

For non rib-bearing lumbar type vertebral bodies are present.
Endplate degenerative changes are noted on the right at L2-3 and to
lesser extent L3-4. Endplate sclerotic changes are present on the
left at L4-S1.

Minimal atherosclerotic changes are noted in the aorta without
aneurysm. No significant adenopathy is present.

L1-2: A broad-based disc protrusion is present. Mild subarticular
narrowing is worse left than right. Moderate left foraminal
narrowing is present.

L2-3: A broad-based disc protrusion is present. Moderate right and
mild left subarticular narrowing is present. Moderate right and mild
left foraminal narrowing is present.

L3-4: A broad-based disc protrusion is present. Moderate
subarticular narrowing is evident bilaterally. Severe right and
moderate left foraminal narrowing is present.

L4-S1: Broad-based disc protrusion is present. Advanced facet
hypertrophy is noted bilaterally. Severe left and mild right
subarticular narrowing is present. Moderate foraminal narrowing is
worse on the left.
IMPRESSION: 1. Transitional anatomy with 4 lumbar type vertebral bodies.
2. Mild subarticular narrowing at L1-2 is worse on the left.
Moderate left foraminal narrowing at L1-2.
3. Moderate right and mild left subarticular and foraminal narrowing
at L2-3.
4. Moderate subarticular narrowing bilaterally at L3-4 with severe
right and moderate left foraminal stenosis.
5. Severe left and mild right subarticular narrowing at L4-S1 with
moderate foraminal narrowing bilaterally, worse on the left.
6.  Aortic Atherosclerosis ([id number]).

## 2018-07-01 NOTE — H&P (Signed)
TOTAL HIP ADMISSION H&P  Patient is admitted for left total hip arthroplasty.  Subjective:  Chief Complaint: left hip pain  HPI: [name], 72 y.o. female, has a history of pain and functional disability in the left hip(s) due to [place] and  arthritis and patient has failed non-surgical conservative treatments for greater than 12 weeks to include NSAID's and/or analgesics, use of assistive devices and activity modification.  Onset of symptoms was gradual starting several years ago with gradually worsening course since that time.The patient noted no past surgery on the left hip(s).  Patient currently rates pain in the left hip at 10 out of 10 with activity. Patient has night pain, worsening of pain with activity and weight bearing, trendelenberg gait, pain that interfers with activities of daily living and pain with passive range of motion. Patient has evidence of joint subluxation and joint space narrowing by imaging studies. This condition presents safety issues increasing the risk of falls.    There is no current active infection.  Patient Active Problem List   Diagnosis Date Noted  . Primary osteoarthritis of right hip 05/18/2018  . Osteoarthritis of right hip 05/17/2018  . Lumbar stenosis with neurogenic claudication 01/31/2018  . Elevated LFTs 12/01/2017  . HTN (hypertension) 09/03/2016  . Diabetes ([place]) 09/03/2016  . Radiculopathy 12/11/2015   Past Medical History:  Diagnosis Date  . Arthritis   . Asthma   . Back pain   . Diabetes mellitus    type 2  . Dyspnea    with asthma attacks   . Dysrhythmia   . Family history of adverse reaction to anesthesia    my first cousin had difficulty waking up   . Fibroid    ovarian  . GERD (gastroesophageal reflux disease)   . Hip osteoarthritis    Left  . History of staph infection    in hospital for 11 days/ in 2007  . Hyperlipidemia   . Hypertension   . Ovarian cyst   . Radiculopathy   . Wears glasses     Past Surgical  History:  Procedure Laterality Date  . ABDOMINAL HYSTERECTOMY  1998   TAH,BSO  . ANTERIOR CERVICAL DECOMP/DISCECTOMY FUSION N/A 12/11/2015   Procedure: ANTERIOR CERVICAL DECOMPRESSION/DISCECTOMY FUSION 2 LEVELS;  Surgeon: Phylliss Bob, MD;  Location: [facility];  Service: Orthopedics;  Laterality: N/A;  Anterior cervical decompression fusion, cervical 6-7, cervical 7-thoracic 1 with instrumentation and allograft  . BACK SURGERY     "NECK DISC" SURGERY  . CARDIAC CATHETERIZATION  1998  . CATARACT EXTRACTION W/ INTRAOCULAR LENS  IMPLANT, BILATERAL  2015   [place]  . COLONOSCOPY    . LAPAROSCOPIC OVARIAN CYSTECTOMY  1970  . LUMBAR LAMINECTOMY/DECOMPRESSION MICRODISCECTOMY N/A 01/31/2018   Procedure: LAMINECTOMY AND FORAMINOTOMY LUMBAR TWO- LUMBAR THREE, LUMBAR THREE- LUMBAR FOUR, LUMBAR FOUR- LUMBAR FIVE ;  Surgeon: Newman Pies, MD;  Location: [facility];  Service: Neurosurgery;  Laterality: N/A;  . ROTATOR CUFF REPAIR  02/2003   right shoulder  . SHOULDER SURGERY  10/2003  . thumb surgery  2010   right thumb  . TONSILLECTOMY    . TOTAL HIP ARTHROPLASTY Right 05/18/2018  . TOTAL HIP ARTHROPLASTY Right 05/18/2018   Procedure: TOTAL HIP ARTHROPLASTY ANTERIOR APPROACH;  Surgeon: Frederik Pear, MD;  Location: [facility];  Service: Orthopedics;  Laterality: Right;  . TUBAL LIGATION      No current facility-administered medications for this encounter.    Current Outpatient Medications  Medication Sig Dispense Refill Last Dose  .  acetaminophen (TYLENOL) 500 MG tablet Take 1,000 mg by mouth daily as needed for moderate pain or headache.   Taking  . albuterol (PROVENTIL HFA;VENTOLIN HFA) 108 (90 Base) MCG/ACT inhaler Inhale 2 puffs into the lungs every 6 (six) hours as needed for wheezing or shortness of breath.   Taking  . aspirin EC 81 MG tablet Take 1 tablet (81 mg total) by mouth 2 (two) times daily. 60 tablet 0 Taking  . bumetanide (BUMEX) 1 MG tablet Take 1 mg by mouth 2 (two) times daily.    Taking  .  cetirizine (ZYRTEC) 10 MG tablet Take 10 mg by mouth daily as needed for allergies (during Spring/Summer).   Taking  . cholecalciferol (VITAMIN D) 1000 units tablet Take 1,000 Units by mouth daily.   Taking  . diclofenac (VOLTAREN) 75 MG EC tablet Take 75 mg by mouth 2 (two) times daily.   Taking  . diltiazem (CARDIZEM CD) 120 MG 24 hr capsule Take 120 mg by mouth daily before breakfast.    Taking  . docusate sodium (COLACE) 100 MG capsule Take 100 mg by mouth every other day.   Taking  . enalapril (VASOTEC) 20 MG tablet Take 20 mg by mouth at bedtime.    Taking  . folic acid (FOLVITE) 626 MCG tablet Take 800 mcg by mouth daily.   Taking  . gabapentin (NEURONTIN) 300 MG capsule Take 300 mg by mouth 3 (three) times daily.    Taking  . lovastatin (MEVACOR) 40 MG tablet Take 40 mg by mouth at bedtime.    Taking  . metFORMIN (GLUCOPHAGE-XR) 500 MG 24 hr tablet Take 500-1,000 mg by mouth See admin instructions. Take 1 tablet (500 mg) in the morning and take 2 tablets (1000 mg) with supper   Taking  . methotrexate (RHEUMATREX) 2.5 MG tablet Take 12.5 mg by mouth every Sunday at [place]. Protect from light.    Taking  . Multiple Vitamin (MULTIVITAMIN WITH MINERALS) TABS tablet Take 1 tablet by mouth daily.   Taking  . oxyCODONE-acetaminophen (PERCOCET/ROXICET) 5-325 MG tablet Take 1 tablet by mouth every 4 (four) hours as needed for severe pain. 30 tablet 0 Taking  . ranitidine (ZANTAC) 300 MG tablet Take 300 mg by mouth 2 (two) times daily.    Taking  . sodium chloride (OCEAN) 0.65 % SOLN nasal spray Place 1-2 sprays into the nose 4 (four) times daily as needed for congestion.   Taking  . tiZANidine (ZANAFLEX) 2 MG tablet Take 1 tablet (2 mg total) by mouth every 6 (six) hours as needed. 60 tablet 0 Taking   Allergies  Allergen Reactions  . Ivp Dye [Iodinated Diagnostic Agents] Nausea And Vomiting  . Prednisone Other (See Comments)    reflux    Social History   Tobacco Use  .  Smoking status: Former Smoker    Types: Cigarettes    Last attempt to quit: 11/23/1992    Years since quitting: 25.6  . Smokeless tobacco: Never Used  Substance Use Topics  . Alcohol use: No    Family History  Problem Relation Age of Onset  . Diabetes Sister   . Diabetes Brother   . Diabetes Brother   . Diabetes Paternal Uncle   . Cancer Paternal Aunt        UTERINE  . Hypertension Maternal Grandmother   . Heart disease Maternal Grandmother   . Colon cancer Neg Hx      Review of Systems  Constitutional: Positive for weight loss.  HENT: Positive for sinus pain.   Eyes: Negative.   Respiratory: Negative.   Cardiovascular:       HTN  Gastrointestinal: Negative.   Genitourinary: Negative.   Musculoskeletal: Positive for joint pain.  Skin: Negative.   Neurological: Negative.   Endo/Heme/Allergies: Negative.   Psychiatric/Behavioral: Negative.     Objective:  Physical Exam  Constitutional: She is oriented to person, place, and time. She appears well-developed and well-nourished.  HENT:  Head: Normocephalic and atraumatic.  Eyes: Pupils are equal, round, and reactive to light.  Neck: Normal range of motion. Neck supple.  Cardiovascular: Intact distal pulses.  Respiratory: Effort normal.  Musculoskeletal: She exhibits tenderness.  The left is highly irritable to internal or external rotation and foot tap is mildly positive.  Neurological: She is alert and oriented to person, place, and time.  Skin: Skin is warm and dry.  Psychiatric: She has a normal mood and affect. Her behavior is normal. Judgment and thought content normal.    Vital signs in last 24 hours:    Labs:   Estimated body mass index is 32.29 kg/m as calculated from the following:   Height as of 06/24/18: 4' 8.5" (1.435 m).   Weight as of 06/24/18: 66.5 kg.   Imaging Review Plain radiographs demonstrate AP pelvis and crosstable lateral show well-placed well fixed right total up arthroplasty, DePuy  Pinnacle shell and Tri-Lock stem.  End-stage arthritis of the left hip which is 50% subluxed and shortened consistent with DDH.    Preoperative templating of the joint replacement has been completed, documented, and submitted to the Operating Room personnel in order to optimize intra-operative equipment management.     Assessment/Plan:  End stage arthritis, left hip(s)  The patient history, physical examination, clinical judgement of the provider and imaging studies are consistent with end stage degenerative joint disease of the left hip(s) and total hip arthroplasty is deemed medically necessary. The treatment options including medical management, injection therapy, arthroscopy and arthroplasty were discussed at length. The risks and benefits of total hip arthroplasty were presented and reviewed. The risks due to aseptic loosening, infection, stiffness, dislocation/subluxation,  thromboembolic complications and other imponderables were discussed.  The patient acknowledged the explanation, agreed to proceed with the plan and consent was signed. Patient is being admitted for inpatient treatment for surgery, pain control, PT, OT, prophylactic antibiotics, VTE prophylaxis, progressive ambulation and ADL's and discharge planning.The patient is planning to be discharged home with home health services.

## 2018-07-05 MED ORDER — TRANEXAMIC ACID 1000 MG/10ML IV SOLN
2000.0000 mg | INTRAVENOUS | Status: AC
  Administered 2018-07-06: 2000 mg via TOPICAL
  Filled 2018-07-05: qty 20

## 2018-07-05 MED ORDER — TRANEXAMIC ACID 1000 MG/10ML IV SOLN
1000.0000 mg | INTRAVENOUS | Status: AC
  Administered 2018-07-06: 1000 mg via INTRAVENOUS
  Filled 2018-07-05: qty 1100

## 2018-07-05 MED ORDER — BUPIVACAINE LIPOSOME 1.3 % IJ SUSP
10.0000 mL | Freq: Once | INTRAMUSCULAR | Status: AC
  Administered 2018-07-06: 20 mL
  Filled 2018-07-05: qty 10

## 2018-07-06 ENCOUNTER — Inpatient Hospital Stay (HOSPITAL_COMMUNITY): Payer: Medicare HMO

## 2018-07-06 ENCOUNTER — Encounter (HOSPITAL_COMMUNITY): Payer: Self-pay | Admitting: Anesthesiology

## 2018-07-06 ENCOUNTER — Encounter (HOSPITAL_COMMUNITY)
Admission: RE | Disposition: A | Discharge status: Home / Self Care | Payer: Self-pay | Source: Ambulatory Visit | Attending: Orthopedic Surgery

## 2018-07-06 ENCOUNTER — Inpatient Hospital Stay (HOSPITAL_COMMUNITY)
Admission: RE | Admit: 2018-07-06 | Discharge: 2018-07-08 | DRG: 470 | Disposition: A | Discharge status: Home / Self Care | Payer: Medicare HMO | Source: Ambulatory Visit | Attending: Orthopedic Surgery | Admitting: Orthopedic Surgery

## 2018-07-06 DIAGNOSIS — Z973 Presence of spectacles and contact lenses: Secondary | ICD-10-CM | POA: Diagnosis not present

## 2018-07-06 DIAGNOSIS — Z981 Arthrodesis status: Secondary | ICD-10-CM | POA: Diagnosis not present

## 2018-07-06 DIAGNOSIS — Z9089 Acquired absence of other organs: Secondary | ICD-10-CM | POA: Diagnosis not present

## 2018-07-06 DIAGNOSIS — Z8619 Personal history of other infectious and parasitic diseases: Secondary | ICD-10-CM | POA: Diagnosis not present

## 2018-07-06 DIAGNOSIS — Z90710 Acquired absence of both cervix and uterus: Secondary | ICD-10-CM | POA: Diagnosis not present

## 2018-07-06 DIAGNOSIS — Z9841 Cataract extraction status, right eye: Secondary | ICD-10-CM | POA: Diagnosis not present

## 2018-07-06 DIAGNOSIS — Z7982 Long term (current) use of aspirin: Secondary | ICD-10-CM | POA: Diagnosis not present

## 2018-07-06 DIAGNOSIS — Z91041 Radiographic dye allergy status: Secondary | ICD-10-CM

## 2018-07-06 DIAGNOSIS — M170 Bilateral primary osteoarthritis of knee: Secondary | ICD-10-CM | POA: Diagnosis present

## 2018-07-06 DIAGNOSIS — Z9842 Cataract extraction status, left eye: Secondary | ICD-10-CM | POA: Diagnosis not present

## 2018-07-06 DIAGNOSIS — Z87891 Personal history of nicotine dependence: Secondary | ICD-10-CM | POA: Diagnosis not present

## 2018-07-06 DIAGNOSIS — M1612 Unilateral primary osteoarthritis, left hip: Secondary | ICD-10-CM | POA: Diagnosis present

## 2018-07-06 DIAGNOSIS — Z8049 Family history of malignant neoplasm of other genital organs: Secondary | ICD-10-CM | POA: Diagnosis not present

## 2018-07-06 DIAGNOSIS — E119 Type 2 diabetes mellitus without complications: Secondary | ICD-10-CM | POA: Diagnosis present

## 2018-07-06 DIAGNOSIS — E785 Hyperlipidemia, unspecified: Secondary | ICD-10-CM | POA: Diagnosis not present

## 2018-07-06 DIAGNOSIS — Z8249 Family history of ischemic heart disease and other diseases of the circulatory system: Secondary | ICD-10-CM | POA: Diagnosis not present

## 2018-07-06 DIAGNOSIS — K219 Gastro-esophageal reflux disease without esophagitis: Secondary | ICD-10-CM | POA: Diagnosis not present

## 2018-07-06 DIAGNOSIS — M1611 Unilateral primary osteoarthritis, right hip: Secondary | ICD-10-CM | POA: Diagnosis not present

## 2018-07-06 DIAGNOSIS — M19041 Primary osteoarthritis, right hand: Secondary | ICD-10-CM | POA: Diagnosis present

## 2018-07-06 DIAGNOSIS — J45909 Unspecified asthma, uncomplicated: Secondary | ICD-10-CM | POA: Diagnosis present

## 2018-07-06 DIAGNOSIS — Z96642 Presence of left artificial hip joint: Secondary | ICD-10-CM | POA: Diagnosis not present

## 2018-07-06 DIAGNOSIS — Z833 Family history of diabetes mellitus: Secondary | ICD-10-CM

## 2018-07-06 DIAGNOSIS — M19072 Primary osteoarthritis, left ankle and foot: Secondary | ICD-10-CM | POA: Diagnosis present

## 2018-07-06 DIAGNOSIS — M19071 Primary osteoarthritis, right ankle and foot: Secondary | ICD-10-CM | POA: Diagnosis present

## 2018-07-06 DIAGNOSIS — Z961 Presence of intraocular lens: Secondary | ICD-10-CM | POA: Diagnosis present

## 2018-07-06 DIAGNOSIS — Z888 Allergy status to other drugs, medicaments and biological substances status: Secondary | ICD-10-CM

## 2018-07-06 DIAGNOSIS — M19042 Primary osteoarthritis, left hand: Secondary | ICD-10-CM | POA: Diagnosis present

## 2018-07-06 DIAGNOSIS — D62 Acute posthemorrhagic anemia: Secondary | ICD-10-CM | POA: Diagnosis not present

## 2018-07-06 DIAGNOSIS — Z419 Encounter for procedure for purposes other than remedying health state, unspecified: Secondary | ICD-10-CM

## 2018-07-06 DIAGNOSIS — I10 Essential (primary) hypertension: Secondary | ICD-10-CM | POA: Diagnosis not present

## 2018-07-06 DIAGNOSIS — Z471 Aftercare following joint replacement surgery: Secondary | ICD-10-CM | POA: Diagnosis not present

## 2018-07-06 HISTORY — DX: Low back pain, unspecified: M54.50

## 2018-07-06 HISTORY — PX: TOTAL HIP ARTHROPLASTY: SHX124

## 2018-07-06 HISTORY — DX: Type 2 diabetes mellitus without complications: E11.9

## 2018-07-06 HISTORY — DX: Other chronic pain: G89.29

## 2018-07-06 HISTORY — DX: Low back pain: M54.5

## 2018-07-06 LAB — GLUCOSE, CAPILLARY
GLUCOSE-CAPILLARY: 221 mg/dL — AB (ref 70–99)
Glucose-Capillary: 92 mg/dL (ref 70–99)
Glucose-Capillary: 130 mg/dL — ABNORMAL HIGH (ref 70–99)
Glucose-Capillary: 98 mg/dL (ref 70–99)

## 2018-07-06 SURGERY — ARTHROPLASTY, HIP, TOTAL, ANTERIOR APPROACH
Anesthesia: General | Site: Hip | Laterality: Left

## 2018-07-06 MED ORDER — ALUMINUM HYDROXIDE GEL 320 MG/5ML PO SUSP: 15.0000 mL | ORAL | Status: DC | PRN

## 2018-07-06 MED ORDER — OXYCODONE HCL 5 MG PO TABS
ORAL_TABLET | ORAL | Status: AC
  Filled 2018-07-06: qty 1

## 2018-07-06 MED ORDER — CHLORHEXIDINE GLUCONATE 4 % EX LIQD: 60.0000 mL | Freq: Once | CUTANEOUS | Status: DC

## 2018-07-06 MED ORDER — HYDROMORPHONE HCL 1 MG/ML IJ SOLN: 0.5000 mg | INTRAMUSCULAR | Status: DC | PRN

## 2018-07-06 MED ORDER — FAMOTIDINE 20 MG PO TABS
20.0000 mg | ORAL_TABLET | Freq: Every day | ORAL | Status: DC
  Administered 2018-07-07 – 2018-07-08 (×2): 20 mg via ORAL
  Filled 2018-07-06 (×2): qty 1

## 2018-07-06 MED ORDER — MENTHOL 3 MG MT LOZG: 1.0000 | LOZENGE | OROMUCOSAL | Status: DC | PRN

## 2018-07-06 MED ORDER — CELECOXIB 200 MG PO CAPS
200.0000 mg | ORAL_CAPSULE | Freq: Two times a day (BID) | ORAL | Status: DC
  Administered 2018-07-06 – 2018-07-08 (×4): 200 mg via ORAL
  Filled 2018-07-06 (×4): qty 1

## 2018-07-06 MED ORDER — DOCUSATE SODIUM 100 MG PO CAPS: 100.0000 mg | ORAL_CAPSULE | ORAL | Status: DC

## 2018-07-06 MED ORDER — BUMETANIDE 1 MG PO TABS
1.0000 mg | ORAL_TABLET | Freq: Two times a day (BID) | ORAL | Status: DC
  Administered 2018-07-07 – 2018-07-08 (×2): 1 mg via ORAL
  Filled 2018-07-06 (×4): qty 1

## 2018-07-06 MED ORDER — FENTANYL CITRATE (PF) 250 MCG/5ML IJ SOLN
INTRAMUSCULAR | Status: DC | PRN
  Administered 2018-07-06: 100 ug via INTRAVENOUS
  Administered 2018-07-06 (×3): 50 ug via INTRAVENOUS

## 2018-07-06 MED ORDER — FENTANYL CITRATE (PF) 100 MCG/2ML IJ SOLN
25.0000 ug | INTRAMUSCULAR | Status: DC | PRN
  Administered 2018-07-06: 25 ug via INTRAVENOUS

## 2018-07-06 MED ORDER — ROCURONIUM BROMIDE 10 MG/ML (PF) SYRINGE
PREFILLED_SYRINGE | INTRAVENOUS | Status: AC
  Filled 2018-07-06: qty 30

## 2018-07-06 MED ORDER — ONDANSETRON HCL 4 MG/2ML IJ SOLN: 4.0000 mg | Freq: Four times a day (QID) | INTRAMUSCULAR | Status: DC | PRN

## 2018-07-06 MED ORDER — POLYETHYLENE GLYCOL 3350 17 G PO PACK: 17.0000 g | PACK | Freq: Every day | ORAL | Status: DC | PRN

## 2018-07-06 MED ORDER — LORATADINE 10 MG PO TABS
10.0000 mg | ORAL_TABLET | Freq: Every day | ORAL | Status: DC
  Administered 2018-07-07 – 2018-07-08 (×2): 10 mg via ORAL
  Filled 2018-07-06 (×2): qty 1

## 2018-07-06 MED ORDER — PRAVASTATIN SODIUM 40 MG PO TABS
40.0000 mg | ORAL_TABLET | Freq: Every day | ORAL | Status: DC
  Administered 2018-07-07: 40 mg via ORAL
  Filled 2018-07-06: qty 1

## 2018-07-06 MED ORDER — PHENYLEPHRINE 40 MCG/ML (10ML) SYRINGE FOR IV PUSH (FOR BLOOD PRESSURE SUPPORT)
PREFILLED_SYRINGE | INTRAVENOUS | Status: AC
  Filled 2018-07-06: qty 20

## 2018-07-06 MED ORDER — ACETAMINOPHEN 325 MG PO TABS: 325.0000 mg | ORAL_TABLET | Freq: Four times a day (QID) | ORAL | Status: DC | PRN

## 2018-07-06 MED ORDER — VITAMIN D 1000 UNITS PO TABS
1000.0000 [IU] | ORAL_TABLET | Freq: Every day | ORAL | Status: DC
  Administered 2018-07-07 – 2018-07-08 (×2): 1000 [IU] via ORAL
  Filled 2018-07-06 (×2): qty 1

## 2018-07-06 MED ORDER — SALINE SPRAY 0.65 % NA SOLN
1.0000 | Freq: Four times a day (QID) | NASAL | Status: DC | PRN
  Filled 2018-07-06: qty 44

## 2018-07-06 MED ORDER — FENTANYL CITRATE (PF) 250 MCG/5ML IJ SOLN
INTRAMUSCULAR | Status: AC
  Filled 2018-07-06: qty 5

## 2018-07-06 MED ORDER — PHENOL 1.4 % MT LIQD: 1.0000 | OROMUCOSAL | Status: DC | PRN

## 2018-07-06 MED ORDER — BUPIVACAINE HCL (PF) 0.5 % IJ SOLN
INTRAMUSCULAR | Status: AC
  Filled 2018-07-06: qty 30

## 2018-07-06 MED ORDER — ONDANSETRON HCL 4 MG/2ML IJ SOLN
INTRAMUSCULAR | Status: DC | PRN
  Administered 2018-07-06: 4 mg via INTRAVENOUS

## 2018-07-06 MED ORDER — ASPIRIN 81 MG PO CHEW
81.0000 mg | CHEWABLE_TABLET | Freq: Two times a day (BID) | ORAL | Status: DC
  Administered 2018-07-06 – 2018-07-08 (×4): 81 mg via ORAL
  Filled 2018-07-06 (×4): qty 1

## 2018-07-06 MED ORDER — KCL IN DEXTROSE-NACL 20-5-0.45 MEQ/L-%-% IV SOLN
INTRAVENOUS | Status: DC
  Administered 2018-07-06: 22:00:00 via INTRAVENOUS
  Filled 2018-07-06 (×3): qty 1000

## 2018-07-06 MED ORDER — PANTOPRAZOLE SODIUM 40 MG PO TBEC
40.0000 mg | DELAYED_RELEASE_TABLET | Freq: Every day | ORAL | Status: DC
  Administered 2018-07-07 – 2018-07-08 (×2): 40 mg via ORAL
  Filled 2018-07-06 (×2): qty 1

## 2018-07-06 MED ORDER — TRANEXAMIC ACID 1000 MG/10ML IV SOLN
1000.0000 mg | Freq: Once | INTRAVENOUS | Status: AC
  Administered 2018-07-06: 1000 mg via INTRAVENOUS
  Filled 2018-07-06: qty 10

## 2018-07-06 MED ORDER — OXYCODONE HCL 5 MG PO TABS
5.0000 mg | ORAL_TABLET | ORAL | Status: DC | PRN
  Administered 2018-07-06 – 2018-07-08 (×3): 5 mg via ORAL
  Filled 2018-07-06: qty 1
  Filled 2018-07-06: qty 2

## 2018-07-06 MED ORDER — METFORMIN HCL ER 500 MG PO TB24
1000.0000 mg | ORAL_TABLET | Freq: Every day | ORAL | Status: DC
  Administered 2018-07-07: 1000 mg via ORAL
  Filled 2018-07-06: qty 2

## 2018-07-06 MED ORDER — BUPIVACAINE-EPINEPHRINE (PF) 0.5% -1:200000 IJ SOLN
INTRAMUSCULAR | Status: DC | PRN
  Administered 2018-07-06: 50 mL via PERINEURAL

## 2018-07-06 MED ORDER — FENTANYL CITRATE (PF) 100 MCG/2ML IJ SOLN
INTRAMUSCULAR | Status: AC
  Filled 2018-07-06: qty 2

## 2018-07-06 MED ORDER — ACETAMINOPHEN 500 MG PO TABS
1000.0000 mg | ORAL_TABLET | Freq: Four times a day (QID) | ORAL | Status: AC
  Administered 2018-07-07 (×4): 1000 mg via ORAL
  Filled 2018-07-06 (×4): qty 2

## 2018-07-06 MED ORDER — LIDOCAINE HCL (CARDIAC) PF 100 MG/5ML IV SOSY
PREFILLED_SYRINGE | INTRAVENOUS | Status: DC | PRN
  Administered 2018-07-06: 80 mg via INTRAVENOUS

## 2018-07-06 MED ORDER — MIDAZOLAM HCL 2 MG/2ML IJ SOLN
INTRAMUSCULAR | Status: AC
  Filled 2018-07-06: qty 2

## 2018-07-06 MED ORDER — ALBUTEROL SULFATE HFA 108 (90 BASE) MCG/ACT IN AERS: 2.0000 | INHALATION_SPRAY | Freq: Four times a day (QID) | RESPIRATORY_TRACT | Status: DC | PRN

## 2018-07-06 MED ORDER — GABAPENTIN 300 MG PO CAPS
300.0000 mg | ORAL_CAPSULE | Freq: Three times a day (TID) | ORAL | Status: DC
  Administered 2018-07-06 – 2018-07-08 (×4): 300 mg via ORAL
  Filled 2018-07-06 (×4): qty 1

## 2018-07-06 MED ORDER — METHOCARBAMOL 500 MG PO TABS
ORAL_TABLET | ORAL | Status: AC
  Filled 2018-07-06: qty 1

## 2018-07-06 MED ORDER — SUGAMMADEX SODIUM 200 MG/2ML IV SOLN
INTRAVENOUS | Status: DC | PRN
  Administered 2018-07-06: 133 mg via INTRAVENOUS

## 2018-07-06 MED ORDER — 0.9 % SODIUM CHLORIDE (POUR BTL) OPTIME
TOPICAL | Status: DC | PRN
  Administered 2018-07-06: 1000 mL

## 2018-07-06 MED ORDER — LIDOCAINE 2% (20 MG/ML) 5 ML SYRINGE
INTRAMUSCULAR | Status: AC
  Filled 2018-07-06: qty 15

## 2018-07-06 MED ORDER — PHENYLEPHRINE HCL 10 MG/ML IJ SOLN
INTRAMUSCULAR | Status: DC | PRN
  Administered 2018-07-06: 80 ug via INTRAVENOUS
  Administered 2018-07-06: 120 ug via INTRAVENOUS

## 2018-07-06 MED ORDER — DEXAMETHASONE SODIUM PHOSPHATE 4 MG/ML IJ SOLN
INTRAMUSCULAR | Status: DC | PRN
  Administered 2018-07-06: 5 mg via INTRAVENOUS

## 2018-07-06 MED ORDER — METHOCARBAMOL 1000 MG/10ML IJ SOLN
500.0000 mg | Freq: Four times a day (QID) | INTRAVENOUS | Status: DC | PRN
  Filled 2018-07-06: qty 5

## 2018-07-06 MED ORDER — PHENYLEPHRINE HCL 10 MG/ML IJ SOLN
INTRAMUSCULAR | Status: DC | PRN
  Administered 2018-07-06: 25 ug/min via INTRAVENOUS

## 2018-07-06 MED ORDER — DEXAMETHASONE SODIUM PHOSPHATE 10 MG/ML IJ SOLN
INTRAMUSCULAR | Status: AC
  Filled 2018-07-06: qty 2

## 2018-07-06 MED ORDER — PROPOFOL 10 MG/ML IV BOLUS
INTRAVENOUS | Status: DC | PRN
  Administered 2018-07-06: 120 mg via INTRAVENOUS

## 2018-07-06 MED ORDER — METOCLOPRAMIDE HCL 5 MG PO TABS: 5.0000 mg | ORAL_TABLET | Freq: Three times a day (TID) | ORAL | Status: DC | PRN

## 2018-07-06 MED ORDER — ENALAPRIL MALEATE 5 MG PO TABS
20.0000 mg | ORAL_TABLET | Freq: Every day | ORAL | Status: DC
  Administered 2018-07-06 – 2018-07-07 (×2): 20 mg via ORAL
  Filled 2018-07-06 (×2): qty 4

## 2018-07-06 MED ORDER — DOCUSATE SODIUM 100 MG PO CAPS
100.0000 mg | ORAL_CAPSULE | Freq: Two times a day (BID) | ORAL | Status: DC
  Administered 2018-07-07 – 2018-07-08 (×3): 100 mg via ORAL
  Filled 2018-07-06 (×3): qty 1

## 2018-07-06 MED ORDER — DIPHENHYDRAMINE HCL 12.5 MG/5ML PO ELIX: 12.5000 mg | ORAL_SOLUTION | ORAL | Status: DC | PRN

## 2018-07-06 MED ORDER — FOLIC ACID 1 MG PO TABS
0.5000 mg | ORAL_TABLET | Freq: Every day | ORAL | Status: DC
  Administered 2018-07-07 – 2018-07-08 (×2): 0.5 mg via ORAL
  Filled 2018-07-06 (×2): qty 1

## 2018-07-06 MED ORDER — METFORMIN HCL ER 500 MG PO TB24
500.0000 mg | ORAL_TABLET | Freq: Every day | ORAL | Status: DC
  Administered 2018-07-07 – 2018-07-08 (×2): 500 mg via ORAL
  Filled 2018-07-06 (×2): qty 1

## 2018-07-06 MED ORDER — METHOCARBAMOL 500 MG PO TABS
500.0000 mg | ORAL_TABLET | Freq: Four times a day (QID) | ORAL | Status: DC | PRN
  Administered 2018-07-06 – 2018-07-08 (×3): 500 mg via ORAL
  Filled 2018-07-06 (×2): qty 1

## 2018-07-06 MED ORDER — LACTATED RINGERS IV SOLN
INTRAVENOUS | Status: DC
  Administered 2018-07-06: 13:00:00 via INTRAVENOUS

## 2018-07-06 MED ORDER — DILTIAZEM HCL ER COATED BEADS 120 MG PO CP24
120.0000 mg | ORAL_CAPSULE | Freq: Every day | ORAL | Status: DC
  Administered 2018-07-07 – 2018-07-08 (×2): 120 mg via ORAL
  Filled 2018-07-06 (×2): qty 1

## 2018-07-06 MED ORDER — BISACODYL 5 MG PO TBEC: 5.0000 mg | DELAYED_RELEASE_TABLET | Freq: Every day | ORAL | Status: DC | PRN

## 2018-07-06 MED ORDER — DEXAMETHASONE SODIUM PHOSPHATE 10 MG/ML IJ SOLN
10.0000 mg | Freq: Once | INTRAMUSCULAR | Status: AC
  Administered 2018-07-07: 10 mg via INTRAVENOUS
  Filled 2018-07-06: qty 1

## 2018-07-06 MED ORDER — CEFAZOLIN SODIUM-DEXTROSE 2-4 GM/100ML-% IV SOLN
2.0000 g | INTRAVENOUS | Status: AC
  Administered 2018-07-06: 2 g via INTRAVENOUS
  Filled 2018-07-06: qty 100

## 2018-07-06 MED ORDER — ONDANSETRON HCL 4 MG/2ML IJ SOLN
INTRAMUSCULAR | Status: AC
  Filled 2018-07-06: qty 6

## 2018-07-06 MED ORDER — EPHEDRINE 5 MG/ML INJ
INTRAVENOUS | Status: AC
  Filled 2018-07-06: qty 20

## 2018-07-06 MED ORDER — ONDANSETRON HCL 4 MG PO TABS: 4.0000 mg | ORAL_TABLET | Freq: Four times a day (QID) | ORAL | Status: DC | PRN

## 2018-07-06 MED ORDER — FLEET ENEMA 7-19 GM/118ML RE ENEM: 1.0000 | ENEMA | Freq: Once | RECTAL | Status: DC | PRN

## 2018-07-06 MED ORDER — FOLIC ACID 800 MCG PO TABS: 800.0000 ug | ORAL_TABLET | Freq: Every day | ORAL | Status: DC

## 2018-07-06 MED ORDER — BUPIVACAINE-EPINEPHRINE 0.5% -1:200000 IJ SOLN
INTRAMUSCULAR | Status: AC
  Filled 2018-07-06: qty 1

## 2018-07-06 MED ORDER — ROCURONIUM BROMIDE 100 MG/10ML IV SOLN
INTRAVENOUS | Status: DC | PRN
  Administered 2018-07-06: 50 mg via INTRAVENOUS

## 2018-07-06 MED ORDER — METOCLOPRAMIDE HCL 5 MG/ML IJ SOLN: 5.0000 mg | Freq: Three times a day (TID) | INTRAMUSCULAR | Status: DC | PRN

## 2018-07-06 SURGICAL SUPPLY — 49 items
BAG DECANTER FOR FLEXI CONT (MISCELLANEOUS) ×2 IMPLANT
BLADE SAW SGTL 18X1.27X75 (BLADE) ×2 IMPLANT
COVER PERINEAL POST (MISCELLANEOUS) ×2 IMPLANT
COVER SURGICAL LIGHT HANDLE (MISCELLANEOUS) ×2 IMPLANT
CUP ACETBLR 48 OD 100 SERIES (Hips) ×2 IMPLANT
DRAPE C-ARM 42X72 X-RAY (DRAPES) ×2 IMPLANT
DRAPE STERI IOBAN 125X83 (DRAPES) ×2 IMPLANT
DRAPE U-SHAPE 47X51 STRL (DRAPES) ×4 IMPLANT
DRSG AQUACEL AG ADV 3.5X10 (GAUZE/BANDAGES/DRESSINGS) ×2 IMPLANT
DURAPREP 26ML APPLICATOR (WOUND CARE) ×2 IMPLANT
ELECT BLADE 4.0 EZ CLEAN MEGAD (MISCELLANEOUS) ×2
ELECT REM PT RETURN 9FT ADLT (ELECTROSURGICAL) ×2
ELECTRODE BLDE 4.0 EZ CLN MEGD (MISCELLANEOUS) ×1 IMPLANT
ELECTRODE REM PT RTRN 9FT ADLT (ELECTROSURGICAL) ×1 IMPLANT
ELIMINATOR HOLE APEX DEPUY (Hips) ×2 IMPLANT
FACESHIELD WRAPAROUND (MASK) ×4 IMPLANT
GLOVE BIO SURGEON STRL SZ7.5 (GLOVE) ×2 IMPLANT
GLOVE BIO SURGEON STRL SZ8.5 (GLOVE) ×2 IMPLANT
GLOVE BIOGEL PI IND STRL 8 (GLOVE) ×1 IMPLANT
GLOVE BIOGEL PI IND STRL 9 (GLOVE) ×1 IMPLANT
GLOVE BIOGEL PI INDICATOR 8 (GLOVE) ×1
GLOVE BIOGEL PI INDICATOR 9 (GLOVE) ×1
GOWN STRL REUS W/ TWL LRG LVL3 (GOWN DISPOSABLE) ×1 IMPLANT
GOWN STRL REUS W/ TWL XL LVL3 (GOWN DISPOSABLE) ×2 IMPLANT
GOWN STRL REUS W/TWL LRG LVL3 (GOWN DISPOSABLE) ×2
GOWN STRL REUS W/TWL XL LVL3 (GOWN DISPOSABLE) ×4
HEAD FEMORAL 32 CERAMIC (Hips) ×2 IMPLANT
KIT BASIN OR (CUSTOM PROCEDURE TRAY) ×2 IMPLANT
KIT TURNOVER KIT B (KITS) ×2 IMPLANT
MANIFOLD NEPTUNE II (INSTRUMENTS) ×2 IMPLANT
NEEDLE HYPO 22GX1.5 SAFETY (NEEDLE) ×4 IMPLANT
NS IRRIG 1000ML POUR BTL (IV SOLUTION) ×2 IMPLANT
PACK TOTAL JOINT (CUSTOM PROCEDURE TRAY) ×2 IMPLANT
PAD ARMBOARD 7.5X6 YLW CONV (MISCELLANEOUS) ×4 IMPLANT
PINN ALTRX NEUT ID X OD 32X48 ×1 IMPLANT
STEM TRI LOC GRIPTION SZ 2 STD ×1 IMPLANT
SUT ETHIBOND NAB CT1 #1 30IN (SUTURE) ×2 IMPLANT
SUT VIC AB 0 CT1 27 (SUTURE)
SUT VIC AB 0 CT1 27XBRD ANBCTR (SUTURE) IMPLANT
SUT VIC AB 1 CTX 36 (SUTURE) ×2
SUT VIC AB 1 CTX36XBRD ANBCTR (SUTURE) ×1 IMPLANT
SUT VIC AB 2-0 CT1 27 (SUTURE) ×2
SUT VIC AB 2-0 CT1 TAPERPNT 27 (SUTURE) ×1 IMPLANT
SUT VIC AB 3-0 CT1 27 (SUTURE) ×2
SUT VIC AB 3-0 CT1 TAPERPNT 27 (SUTURE) ×1 IMPLANT
SYR CONTROL 10ML LL (SYRINGE) ×4 IMPLANT
TOWEL OR 17X26 10 PK STRL BLUE (TOWEL DISPOSABLE) ×2 IMPLANT
TRAY CATH 16FR W/PLASTIC CATH (SET/KITS/TRAYS/PACK) IMPLANT
TRI LOC GRIPTION SZ 2 STD ×2 IMPLANT

## 2018-07-06 NOTE — Interval H&P Note (Signed)
History and Physical Interval Note:  07/06/2018 12:51 PM  [name]  has presented today for surgery, with the diagnosis of LEFT HIP OSTEOARTHRITIS  The various methods of treatment have been discussed with the patient and family. After consideration of risks, benefits and other options for treatment, the patient has consented to  Procedure(s) with comments: LEFT TOTAL HIP ARTHROPLASTY ANTERIOR APPROACH (Left) - Needs RNFA as a surgical intervention .  The patient's history has been reviewed, patient examined, no change in status, stable for surgery.  I have reviewed the patient's chart and labs.  Questions were answered to the patient's satisfaction.     Kerin Salen

## 2018-07-06 NOTE — Transfer of Care (Signed)
Immediate Anesthesia Transfer of Care Note  Patient: [name]  Procedure(s) Performed: LEFT TOTAL HIP ARTHROPLASTY ANTERIOR APPROACH (Left Hip)  Patient Location: PACU  Anesthesia Type:General  Level of Consciousness: awake, patient cooperative and responds to stimulation  Airway & Oxygen Therapy: Patient Spontanous Breathing and Patient connected to face mask oxygen  Post-op Assessment: Report given to RN, Post -op Vital signs reviewed and stable and Patient moving all extremities X 4  Post vital signs: Reviewed and stable  Last Vitals:  Vitals Value Taken Time  BP    Temp    Pulse 93 07/06/2018  2:39 PM  Resp 20 07/06/2018  2:39 PM  SpO2 99 % 07/06/2018  2:39 PM  Vitals shown include unvalidated device data.  Last Pain:  Vitals:   07/06/18 1119  TempSrc:   PainSc: 6          Complications: No apparent anesthesia complications

## 2018-07-06 NOTE — Anesthesia Preprocedure Evaluation (Addendum)
Anesthesia Evaluation  Patient identified by MRN, date of birth, ID band Patient awake    Reviewed: Allergy & Precautions, NPO status , Patient's Chart, lab work & pertinent test results  Airway Mallampati: I  TM Distance: >3 FB Neck ROM: Full    Dental  (+) Teeth Intact, Dental Advisory Given   Pulmonary asthma , former smoker,    breath sounds clear to auscultation       Cardiovascular hypertension, Pt. on medications + dysrhythmias  Rhythm:Regular Rate:Normal     Neuro/Psych negative psych ROS   GI/Hepatic Neg liver ROS, GERD  Medicated,  Endo/Other  diabetes, Type 2, Oral Hypoglycemic Agents  Renal/GU      Musculoskeletal  (+) Arthritis , Osteoarthritis,    Abdominal Normal abdominal exam  (+)   Peds  Hematology negative hematology ROS (+)   Anesthesia Other Findings - HLD  Reproductive/Obstetrics                            Anesthesia Physical Anesthesia Plan  ASA: II  Anesthesia Plan: General   Post-op Pain Management:    Induction: Intravenous  PONV Risk Score and Plan: 4 or greater and Ondansetron, Dexamethasone, Midazolam and Scopolamine patch - Pre-op  Airway Management Planned: Oral ETT  Additional Equipment: None  Intra-op Plan:   Post-operative Plan: Extubation in OR  Informed Consent: I have reviewed the patients History and Physical, chart, labs and discussed the procedure including the risks, benefits and alternatives for the proposed anesthesia with the patient or authorized representative who has indicated his/her understanding and acceptance.   Dental advisory given  Plan Discussed with: CRNA  Anesthesia Plan Comments:        Anesthesia Quick Evaluation

## 2018-07-06 NOTE — Anesthesia Postprocedure Evaluation (Signed)
Anesthesia Post Note  Patient: [name]  Procedure(s) Performed: LEFT TOTAL HIP ARTHROPLASTY ANTERIOR APPROACH (Left Hip)     Patient location during evaluation: PACU Anesthesia Type: General Level of consciousness: awake and alert Pain management: pain level controlled Vital Signs Assessment: post-procedure vital signs reviewed and stable Respiratory status: spontaneous breathing, nonlabored ventilation, respiratory function stable and patient connected to nasal cannula oxygen Cardiovascular status: blood pressure returned to baseline and stable Postop Assessment: no apparent nausea or vomiting Anesthetic complications: no    Last Vitals:  Vitals:   07/06/18 1950 07/06/18 2009  BP:  127/72  Pulse: (!) 104 (!) 109  Resp: (!) 21 (!) 24  Temp:  36.6 C  SpO2: 98% 97%    Last Pain:  Vitals:   07/06/18 2009  TempSrc:   PainSc: 0-No pain                 Audry Pili

## 2018-07-06 NOTE — Op Note (Addendum)
OPERATIVE REPORT    DATE OF PROCEDURE:  07/06/2018       PREOPERATIVE DIAGNOSIS:  LEFT HIP OSTEOARTHRITIS                                                          POSTOPERATIVE DIAGNOSIS:  LEFT HIP OSTEOARTHRITIS                                                           PROCEDURE: Anterior L total hip arthroplasty using a 48 mm DePuy Pinnacle  Cup, [employer], 0-degree polyethylene liner, a +1x32 mm ceramic head, a 2 std Depuy Triloc stem   SURGEON: Kerin Salen    ASSISTANT:   April Green RNFA(present throughout entire procedure and necessary for timely completion of the procedure)   ANESTHESIA: Spinal BLOOD LOSS: 300cc FLUID REPLACEMENT: 1500cc crystalloid Antibiotic: 2gm ancef Tranexamic Acid: 1gm IV, 2gm Topical COMPLICATIONS: none    INDICATIONS FOR PROCEDURE: A 72 y.o. year-old With  LEFT HIP OSTEOARTHRITIS   for 4 years, x-rays show bone-on-bone arthritic changes, and osteophytes. Despite conservative measures with observation, anti-inflammatory medicine, narcotics, use of a cane, has severe unremitting pain and can ambulate only a few blocks before resting. Patient desires elective L total hip arthroplasty to decrease pain and increase function. The risks, benefits, and alternatives were discussed at length including but not limited to the risks of infection, bleeding, nerve injury, stiffness, blood clots, the need for revision surgery, cardiopulmonary complications, among others, and they were willing to proceed. Questions answered     PROCEDURE IN DETAIL: The patient was identified by armband,  received preoperative IV antibiotics in the holding area at [facility], taken to the operating room , appropriate anesthetic monitors  were attached and  anesthesia was induced with the patienton the gurney. The HANA boots were applied to the feet and he was then transferred to the HANA table with a peroneal post and support underneath the non-operative le, which  was locked in 5 lb traction. Theoperative lower extremity was then prepped and draped in the usual sterile fashion from just above the iliac crest to the knee. And a timeout procedure was performed. We then made a 10 cm incision along the interval at the leading edge of the tensor fascia lata of starting at 2 cm lateral to and 2 cm distal to the ASIS. Small bleeders in the skin and subcutaneous tissue identified and cauterized we dissected down to the fascia and made an incision in the fascia allowing [place] to elevate the fascia of the tensor muscle and exploited the interval between the rectus and the tensor fascia lata. A Hohmann retractor was then placed along the superior neck of the femur and a Cobra retractor along the inferior neck of the femur we teed the capsule starting out at the superior anterior aspect of the acetabulum going distally and made the T along the neck both leaflets of the T were tagged with #2 Ethibond suture. Cobra retractors were then placed along the inferior and superior neck allowing [place] to perform a standard neck cut and removed the femoral  head with a power corkscrew. We then placed a right angle Hohmann retractor along the anterior aspect of the acetabulum a spiked Cobra in the cotyloid notch and posteriorly a Muelller retractor. We then sequentially reamed up to a 47 mm basket reamer obtaining good coverage in all quadrants, verified by C-arm imaging. Under C-arm control with and hammered into place a 48 mm Pinnacle cup in 45 of abduction and 15 of anteversion. The cup seated nicely and required no supplemental screws. We then placed a central hole Eliminator and a 0 polyethylene liner. The foot was then externally rotated to 110, the HANA elevator was placed around the flare of the greater trochanter and the limb was extended and abducted delivering the proximal femur up into the wound. A medium Hohmann retractor was placed over the greater trochanter and a Mueller retractor along  the posterior femoral neck completing the exposure. We then performed releases superiorly and and inferiorly of the capsule going back to the pirformis fossa superiorly and to the lesser trochanter inferiorly. We then entered the proximal femur with the box cutting offset chisel followed by, a canal sounder, the chili pepper and broaching up to a 2 broach. This seated nicely and we reamed the calcar. A trial reduction was performed with a 61mm x 32 mm head.The limb lengths were excellent the hip was stable in 90 of external rotation. At this point the trial components removed and we hammered into place a # 2 std  Offset Tri-Lock stem with Gryption coating. A + 1x32 mm ceramic ball was then hammered into place the hip was reduced and final C-arm images obtained. The wound was thoroughly irrigated with normal saline solution. We repaired the ant capsule and the tensor fascia lot a with running 0 vicryl suture. the subcutaneous tissue was closed with 2-0 and 3-0 Vicryl suture followed by an Aquacil dressing. At this point the patient was awaken and transferred to hospital gurney without difficulty. The subcutaneous tissue with 0 and 2-0 undyed Vicryl suture and the skin with running  3-0 vicryl subcuticular suture. Aquacil dressing was applied. The patient was then unclamped, rolled supine, awaken extubated and taken to recovery room without difficulty in stable condition.   Kerin Salen 07/06/2018, 2:05 PM

## 2018-07-07 ENCOUNTER — Encounter (HOSPITAL_COMMUNITY): Payer: Self-pay | Admitting: Orthopedic Surgery

## 2018-07-07 ENCOUNTER — Other Ambulatory Visit: Payer: Self-pay

## 2018-07-07 LAB — GLUCOSE, CAPILLARY
GLUCOSE-CAPILLARY: 112 mg/dL — AB (ref 70–99)
GLUCOSE-CAPILLARY: 180 mg/dL — AB (ref 70–99)
Glucose-Capillary: 184 mg/dL — ABNORMAL HIGH (ref 70–99)
Glucose-Capillary: 176 mg/dL — ABNORMAL HIGH (ref 70–99)

## 2018-07-07 LAB — BASIC METABOLIC PANEL
Anion gap: 10 (ref 5–15)
BUN: 13 mg/dL (ref 8–23)
CHLORIDE: 102 mmol/L (ref 98–111)
CO2: 24 mmol/L (ref 22–32)
Calcium: 8.4 mg/dL — ABNORMAL LOW (ref 8.9–10.3)
Creatinine, Ser: 0.72 mg/dL (ref 0.44–1.00)
GFR calc Af Amer: >60 mL/min (ref >60)
GFR calc non Af Amer: >60 mL/min (ref >60)
Glucose, Bld: 216 mg/dL — ABNORMAL HIGH (ref 70–99)
POTASSIUM: 4 mmol/L (ref 3.5–5.1)
SODIUM: 136 mmol/L (ref 135–145)

## 2018-07-07 LAB — CBC
HCT: 27.1 % — ABNORMAL LOW (ref 36.0–46.0)
HEMOGLOBIN: 8.5 g/dL — AB (ref 12.0–15.0)
MCH: 26.6 pg (ref 26.0–34.0)
MCHC: 31.4 g/dL (ref 30.0–36.0)
MCV: 85 fL (ref 78.0–100.0)
PLATELETS: 374 10*3/uL (ref 150–400)
RBC: 3.19 MIL/uL — ABNORMAL LOW (ref 3.87–5.11)
RDW: 17.1 % — ABNORMAL HIGH (ref 11.5–15.5)
WBC: 11.5 10*3/uL — ABNORMAL HIGH (ref 4.0–10.5)

## 2018-07-07 LAB — HEMOGLOBIN A1C
Hgb A1c MFr Bld: 5.3 % (ref 4.8–5.6)
Mean Plasma Glucose: 105.41 mg/dL

## 2018-07-07 MED ORDER — INSULIN ASPART 100 UNIT/ML ~~LOC~~ SOLN
0.0000 [IU] | Freq: Three times a day (TID) | SUBCUTANEOUS | Status: DC
  Administered 2018-07-07 (×2): 3 [IU] via SUBCUTANEOUS
  Administered 2018-07-08: 2 [IU] via SUBCUTANEOUS

## 2018-07-07 MED ORDER — ENSURE ENLIVE PO LIQD
237.0000 mL | Freq: Two times a day (BID) | ORAL | Status: DC
  Administered 2018-07-07 – 2018-07-08 (×3): 237 mL via ORAL

## 2018-07-07 NOTE — Discharge Instructions (Signed)

## 2018-07-07 NOTE — Progress Notes (Addendum)
PATIENT ID: [name]  MRN: [id number]  DOB/AGE:  June 11, 1946 / 72 y.o.  1 Day Post-Op Procedure(s) (LRB): LEFT TOTAL HIP ARTHROPLASTY ANTERIOR APPROACH (Left)    PROGRESS NOTE Subjective: Patient is alert, oriented, no Nausea, no Vomiting, yes passing gas, . Taking PO well. Denies SOB, Chest or Calf Pain. Using Incentive Spirometer, PAS in place. Ambulate WBAT Patient reports pain as  2/10, void x 2  .    Objective: Vital signs in last 24 hours: Vitals:   07/06/18 1950 07/06/18 2009 07/06/18 2050 07/07/18 0411  BP:  127/72 118/72 118/72  Pulse: (Abnormal) 104 (Abnormal) 109 (Abnormal) 108 (Abnormal) 108  Resp: (Abnormal) 21 (Abnormal) 24 (Abnormal) 24 16  Temp:  97.9 F (36.6 C) 98.3 F (36.8 C) 98.3 F (36.8 C)  TempSrc:   Oral Oral  SpO2: 98% 97% 100%   Height:    4\' 8"  (1.422 m)      Intake/Output from previous day: I/O last 3 completed shifts: In: 800 [I.V.:800] Out: 250 [Blood:250]   Intake/Output this shift: Total I/O In: 636.8 [I.V.:636.8] Out: -    LABORATORY DATA: Recent Labs    07/06/18 1442 07/06/18 1804 07/06/18 2205 07/07/18 0341  WBC  --   --   --  11.5*  HGB  --   --   --  8.5*  HCT  --   --   --  27.1*  PLT  --   --   --  374  NA  --   --   --  136  K  --   --   --  4.0  CL  --   --   --  102  CO2  --   --   --  24  BUN  --   --   --  13  CREATININE  --   --   --  0.72  GLUCOSE  --   --   --  216*  GLUCAP 130* 98 221*  --   CALCIUM  --   --   --  8.4*    Examination: Neurologically intact ABD soft Neurovascular intact Sensation intact distally Intact pulses distally Dorsiflexion/Plantar flexion intact Incision: dressing C/D/I No cellulitis present Compartment soft} XR AP&Lat of hip shows well placed\fixed THA  Assessment:   1 Day Post-Op Procedure(s) (LRB): LEFT TOTAL HIP ARTHROPLASTY ANTERIOR APPROACH (Left) ADDITIONAL DIAGNOSIS:  Expected Acute Blood Loss Anemia, Diabetes  Plan: PT/OT WBAT, THA  DVT Prophylaxis:  SCDx72 hrs, ASA 81 mg BID x 2 weeks  DISCHARGE PLAN: Home, prob tomorrow  DISCHARGE NEEDS: HHPT, Walker and 3-in-1 comode seat

## 2018-07-07 NOTE — Plan of Care (Signed)

## 2018-07-07 NOTE — Progress Notes (Signed)
Physical Therapy Treatment Patient Details Name: [name] MRN: [id number] DOB: [birth year] Today's Date: 07/07/2018    History of Present Illness 72 y/o female s/p elecitve L THA, direct anterior. PMH includes DM, HTN, back surgery, and neck surgery.  Recent R THA 05/17/18, lumbar stenosis with neurogenic claudication, OA    PT Comments    Pt was tired from recent OT visit so reviewed all her handout in exercises, talked about managing bed mob and car transfers.  Pt is expecting to leave soon and will try to walk with her in the AM to increase her mobility and safety toward a better transition home.   Follow Up Recommendations  Home health PT;Supervision for mobility/OOB;Follow surgeon's recommendation for DC plan and follow-up therapies     Equipment Recommendations  None recommended by PT    Recommendations for Other Services       Precautions / Restrictions Precautions Precautions: Fall;Other (comment)(direct anterior) Restrictions Weight Bearing Restrictions: Yes Other Position/Activity Restrictions: WBAT    Mobility  Bed Mobility Overal bed mobility: Needs Assistance             General bed mobility comments: declined more than scooting up in the bed  Transfers                 General transfer comment: declined OOB  Ambulation/Gait                 Stairs             Wheelchair Mobility    Modified Rankin (Stroke Patients Only)       Balance                                            Cognition Arousal/Alertness: Awake/alert Behavior During Therapy: WFL for tasks assessed/performed Overall Cognitive Status: Within Functional Limits for tasks assessed                                        Exercises General Exercises - Lower Extremity Ankle Circles/Pumps: AROM;Both;5 reps Quad Sets: AROM;Both;10 reps Gluteal Sets: AROM;Both;10 reps Heel Slides: AROM;AAROM;Both;10 reps Hip  ABduction/ADduction: AROM;AAROM;Both;10 reps Hip Flexion/Marching: AROM;AAROM;Both;10 reps    General Comments        Pertinent Vitals/Pain Pain Assessment: 0-10 Pain Score: 8  Pain Location: L hip Pain Descriptors / Indicators: Operative site guarding Pain Intervention(s): Limited activity within patient's tolerance;Monitored during session;Premedicated before session;Repositioned;Ice applied    Home Living                      Prior Function            PT Goals (current goals can now be found in the care plan section) Acute Rehab PT Goals Patient Stated Goal: get home and feel less pain Progress towards PT goals: Progressing toward goals    Frequency    7X/week      PT Plan Current plan remains appropriate    Co-evaluation              AM-PAC PT "6 Clicks" Daily Activity  Outcome Measure  Difficulty turning over in bed (including adjusting bedclothes, sheets and blankets)?: A Little Difficulty moving from lying on back to sitting on the side of the bed? : Unable  Difficulty sitting down on and standing up from a chair with arms (e.g., wheelchair, bedside commode, etc,.)?: Unable Help needed moving to and from a bed to chair (including a wheelchair)?: A Little Help needed walking in hospital room?: A Little Help needed climbing 3-5 steps with a railing? : A Little 6 Click Score: 14    End of Session   Activity Tolerance: Patient tolerated treatment well;Patient limited by pain Patient left: in bed;with call bell/phone within reach;with bed alarm set;with family/visitor present Nurse Communication: Mobility status PT Visit Diagnosis: Unsteadiness on feet (R26.81);Muscle weakness (generalized) (M62.81);Pain Pain - Right/Left: Left Pain - part of body: Hip     Time: 9767-3419 PT Time Calculation (min) (ACUTE ONLY): 16 min  Charges:  $Therapeutic Exercise: 8-22 mins $Therapeutic Activity: 8-22 mins                      Ramond Dial 07/07/2018, 5:14 PM   Mee Hives, PT MS Acute Rehab Dept. Number: [place]

## 2018-07-07 NOTE — Consult Note (Signed)
   [facility] CM Inpatient Consult   07/07/2018  [name] Nov 10, 1946 [id number]   Patient is currently active with [place] [facility] with RN Health Coach for chronic disease management services.  Patient has been engaged by a [place] for diabetes and diease management needs.   Our community based plan of care has focused on disease management and community resource support.  Met with the patient at the bedside.  Was able to get written consent form signed and folder with [facility] information.   Patient will receive a post hospital follow up call and will be evaluated for monthly home visits for assessments and disease process education.  Made Inpatient Case Manager aware that [facility] following. Of note, [facility] Care Management services does not replace or interfere with any services that are needed or arranged by inpatient case management or social work.  For additional questions or referrals please contact:   Natividad Brood, RN BSN [facility] Liaison  [phone] business mobile phone Toll free office [phone]

## 2018-07-07 NOTE — Evaluation (Signed)
Occupational Therapy Evaluation Patient Details Name: [name] MRN: [id number] DOB: 1946/03/31 Today's Date: 07/07/2018    History of Present Illness 72 y/o female s/p elecitve L THA, direct anterior. PMH includes DM, HTN, back surgery, and neck surgery.  Recent R THA 05/17/18, lumbar stenosis with neurogenic claudication, OA   Clinical Impression   This 72 y/o female presents with the above. At baseline Pt was using RW for mobility due to previous hip surgery, was completing ADLs with supervision. Pt performing functional mobility using RW, toileting, standing grooming ADLs and tub transfer with overall minguard assist this session with good carryover of education provided. She currently requires minA for LB ADLs, though reports she has AE and is familiar with AE use for LB tasks. Pt reports she will have good family support to provide supervision/ADL assist at discharge PRN. Education provided and questions answered throughout. No further acute OT needs identified at this time. Will sign off.     Follow Up Recommendations  No OT follow up;Supervision/Assistance - 24 hour    Equipment Recommendations  None recommended by OT           Precautions / Restrictions Precautions Precautions: Fall;Other (comment)(Direct anterior) Restrictions Weight Bearing Restrictions: Yes Other Position/Activity Restrictions: WBAT      Mobility Bed Mobility Overal bed mobility: Needs Assistance Bed Mobility: Supine to Sit;Sit to Supine     Supine to sit: Min guard Sit to supine: Min guard   General bed mobility comments: minguard for safety, increased effort though no physical assist required   Transfers Overall transfer level: Needs assistance Equipment used: Rolling walker (2 wheeled) Transfers: Sit to/from Stand Sit to Stand: Min guard         General transfer comment: VCs and minguard for safety, intermittent cues for hand placement     Balance Overall balance assessment:  Needs assistance;History of Falls Sitting-balance support: Feet supported Sitting balance-Leahy Scale: Good     Standing balance support: Bilateral upper extremity supported;During functional activity Standing balance-Leahy Scale: Fair Standing balance comment: minguard for static standing                           ADL either performed or assessed with clinical judgement   ADL Overall ADL's : Needs assistance/impaired Eating/Feeding: Independent;Sitting   Grooming: Min guard;Standing;Wash/dry hands   Upper Body Bathing: Min guard;Sitting   Lower Body Bathing: Minimal assistance;Sit to/from stand   Upper Body Dressing : Min guard;Sitting   Lower Body Dressing: Minimal assistance;Sit to/from stand   Toilet Transfer: Min guard;Ambulation;Regular Toilet;RW   Toileting- Water quality scientist and Hygiene: Min guard;Sit to/from stand   Tub/ Shower Transfer: Tub transfer;Min guard;Ambulation;3 in 1;Rolling walker Tub/Shower Transfer Details (indicate cue type and reason): close minguard for safety with handout provided, pt return demonstrating understanding of sequence; reports she will have supervision from family initially during tub transfer and bathing task completion  Functional mobility during ADLs: Min guard;Rolling walker General ADL Comments: pt performing functional mobility, toileting, standing grooming ADLs and tub transfer with overall minguard-minA throughout.      Vision         Perception     Praxis      Pertinent Vitals/Pain Pain Assessment: 0-10 Pain Score: 8  Faces Pain Scale: Hurts little more Pain Location: L hip Pain Descriptors / Indicators: Operative site guarding Pain Intervention(s): Monitored during session;Repositioned     Hand Dominance Right   Extremity/Trunk Assessment Upper Extremity Assessment Upper Extremity  Assessment: Overall WFL for tasks assessed   Lower Extremity Assessment Lower Extremity Assessment: Defer to PT  evaluation   Cervical / Trunk Assessment Cervical / Trunk Assessment: Normal   Communication Communication Communication: No difficulties   Cognition Arousal/Alertness: Awake/alert Behavior During Therapy: WFL for tasks assessed/performed Overall Cognitive Status: Within Functional Limits for tasks assessed                                     General Comments       Exercises    Shoulder Instructions      Home Living Family/patient expects to be discharged to:: Private residence Living Arrangements: Spouse/significant other;Children Available Help at Discharge: Family;Available 24 hours/day Type of Home: House Home Access: Ramped entrance     Home Layout: One level     Bathroom Shower/Tub: Teacher, early years/pre: Handicapped height Bathroom Accessibility: Yes   Home Equipment: [place] - 2 wheels;Shower seat - built in   Additional Comments: 2 inch step stool, hand held shower head, spouse requires use of ramp      Prior Functioning/Environment Level of Independence: Independent with assistive device(s)        Comments: had decreased her assistance with family before this surgery        OT Problem List: Decreased strength;Decreased range of motion;Decreased activity tolerance      OT Treatment/Interventions:      OT Goals(Current goals can be found in the care plan section) Acute Rehab OT Goals Patient Stated Goal: to feel stronger and get back independence OT Goal Formulation: All assessment and education complete, DC therapy  OT Frequency:     Barriers to D/C:            Co-evaluation              AM-PAC PT "6 Clicks" Daily Activity     Outcome Measure Help from another person eating meals?: None Help from another person taking care of personal grooming?: None Help from another person toileting, which includes using toliet, bedpan, or urinal?: None Help from another person bathing (including washing,  rinsing, drying)?: A Little Help from another person to put on and taking off regular upper body clothing?: None Help from another person to put on and taking off regular lower body clothing?: A Little 6 Click Score: 22   End of Session Equipment Utilized During Treatment: Gait belt;Rolling walker Nurse Communication: Mobility status  Activity Tolerance: Patient tolerated treatment well Patient left: in bed;with call bell/phone within reach;with bed alarm set  OT Visit Diagnosis: Other abnormalities of gait and mobility (R26.89)                Time: 6808-8110 OT Time Calculation (min): 34 min Charges:  OT General Charges $OT Visit: 1 Visit OT Evaluation $OT Eval Low Complexity: 1 Low OT Treatments $Self Care/Home Management : 8-22 mins  Lou Cal, OT Pager [phone] 07/07/2018  Raymondo Band 07/07/2018, 5:21 PM

## 2018-07-07 NOTE — Evaluation (Addendum)
Physical Therapy Evaluation Patient Details Name: [name] MRN: [id number] DOB: 1946/04/03 Today's Date: 07/07/2018   History of Present Illness  72 y/o female s/p elecitve L THA, direct anterior. PMH includes DM, HTN, back surgery, and neck surgery.  Recent R THA 05/17/18, lumbar stenosis with neurogenic claudication, OA  Clinical Impression  Pt was assessed and is appropriate currently to plan dc per MD recommendations with pt having equipment as needed.  Since her THA is the opposite hip of previous surgery, asked OT to ck on her about the change in logistics for home.  Continue to work toward a shorter recovery time and safety as she sustained a fall during the last recovery time. Focus on balance, strength and endurance with her personal walker which is available.      Follow Up Recommendations Home health PT;Supervision for mobility/OOB;Follow surgeon's recommendation for DC plan and follow-up therapies    Equipment Recommendations  None recommended by PT    Recommendations for Other Services       Precautions / Restrictions Precautions Precautions: Fall;Other (comment)(Direct anterior) Restrictions Weight Bearing Restrictions: Yes Other Position/Activity Restrictions: WBAT      Mobility  Bed Mobility Overal bed mobility: Needs Assistance Bed Mobility: Supine to Sit;Sit to Supine     Supine to sit: Min assist Sit to supine: Min assist   General bed mobility comments: minor help to support and lift LLE to avoid overstressing and increasing pain.    Transfers Overall transfer level: Needs assistance Equipment used: Rolling walker (2 wheeled) Transfers: Sit to/from Stand Sit to Stand: Min guard;Min assist         General transfer comment: min guard to steady after minor help to power up  Ambulation/Gait Ambulation/Gait assistance: Min guard Gait Distance (Feet): 60 Feet Assistive device: Rolling walker (2 wheeled);1 person hand held assist Gait  Pattern/deviations: Step-through pattern;Decreased stride length;Decreased weight shift to left;Wide base of support;Trunk flexed Gait velocity: reduced Gait velocity interpretation: <1.31 ft/sec, indicative of household ambulator General Gait Details: pt reports some increased sensitivity to L hip with walk  Stairs            Wheelchair Mobility    Modified Rankin (Stroke Patients Only)       Balance Overall balance assessment: Needs assistance;History of Falls Sitting-balance support: Feet supported Sitting balance-Leahy Scale: Good     Standing balance support: Bilateral upper extremity supported;During functional activity Standing balance-Leahy Scale: Fair Standing balance comment: lost her balance after R hip THA but controlled per pt to fall to L hip                             Pertinent Vitals/Pain Pain Assessment: Faces Faces Pain Scale: Hurts little more Pain Location: L hip Pain Descriptors / Indicators: Operative site guarding Pain Intervention(s): Limited activity within patient's tolerance;Monitored during session;Premedicated before session;Repositioned    Home Living Family/patient expects to be discharged to:: Private residence Living Arrangements: Spouse/significant other;Children Available Help at Discharge: Family;Available 24 hours/day Type of Home: House Home Access: Ramped entrance     Home Layout: One level Home Equipment: [place] - 2 wheels;Shower seat - built in Additional Comments: 2 inch step stool, hand held shower head, spouse requires use of ramp    Prior Function Level of Independence: Independent with assistive device(s)         Comments: had decreased her assistance with family before this surgery     Hand Dominance  Dominant Hand: Right    Extremity/Trunk Assessment   Upper Extremity Assessment Upper Extremity Assessment: Overall WFL for tasks assessed    Lower Extremity Assessment Lower  Extremity Assessment: Generalized weakness    Cervical / Trunk Assessment Cervical / Trunk Assessment: Normal  Communication   Communication: No difficulties  Cognition Arousal/Alertness: Awake/alert Behavior During Therapy: WFL for tasks assessed/performed Overall Cognitive Status: Within Functional Limits for tasks assessed                                        General Comments General comments (skin integrity, edema, etc.): Pt is not having exessive edema or irritation on L hip surgery site    Exercises     Assessment/Plan    PT Assessment Patient needs continued PT services  PT Problem List Decreased strength;Decreased range of motion;Decreased activity tolerance;Decreased balance;Decreased mobility;Decreased coordination;Obesity;Decreased skin integrity;Pain       PT Treatment Interventions DME instruction;Gait training;Functional mobility training;Therapeutic activities;Therapeutic exercise;Balance training;Neuromuscular re-education;Patient/family education    PT Goals (Current goals can be found in the Care Plan section)  Acute Rehab PT Goals Patient Stated Goal: to feel stronger and get back independence PT Goal Formulation: With patient Time For Goal Achievement: 07/21/18 Potential to Achieve Goals: Good    Frequency 7X/week   Barriers to discharge Decreased caregiver support her husband uses the ramp as a modification    Co-evaluation               AM-PAC PT "6 Clicks" Daily Activity  Outcome Measure Difficulty turning over in bed (including adjusting bedclothes, sheets and blankets)?: A Little Difficulty moving from lying on back to sitting on the side of the bed? : Unable Difficulty sitting down on and standing up from a chair with arms (e.g., wheelchair, bedside commode, etc,.)?: Unable Help needed moving to and from a bed to chair (including a wheelchair)?: A Little Help needed walking in hospital room?: A Little Help needed  climbing 3-5 steps with a railing? : A Lot 6 Click Score: 13    End of Session Equipment Utilized During Treatment: Gait belt Activity Tolerance: Patient tolerated treatment well;Patient limited by fatigue;Patient limited by pain Patient left: in bed;with call bell/phone within reach;with nursing/sitter in room;with bed alarm set Nurse Communication: Mobility status PT Visit Diagnosis: Unsteadiness on feet (R26.81);Muscle weakness (generalized) (M62.81);Pain Pain - Right/Left: Left Pain - part of body: Hip    Time: 1610-9604 PT Time Calculation (min) (ACUTE ONLY): 28 min   Charges:   PT Evaluation $PT Eval Moderate Complexity: 1 Mod PT Treatments $Gait Training: 8-22 mins        Ramond Dial 07/07/2018, 10:00 AM   Mee Hives, PT MS Acute Rehab Dept. Number: [place] and [place]

## 2018-07-08 LAB — CBC
HCT: 26.9 % — ABNORMAL LOW (ref 36.0–46.0)
HEMOGLOBIN: 8.2 g/dL — AB (ref 12.0–15.0)
MCH: 26.2 pg (ref 26.0–34.0)
MCHC: 30.5 g/dL (ref 30.0–36.0)
MCV: 85.9 fL (ref 78.0–100.0)
PLATELETS: 362 10*3/uL (ref 150–400)
RBC: 3.13 MIL/uL — AB (ref 3.87–5.11)
RDW: 17.2 % — ABNORMAL HIGH (ref 11.5–15.5)
WBC: 13.4 10*3/uL — AB (ref 4.0–10.5)

## 2018-07-08 LAB — GLUCOSE, CAPILLARY
GLUCOSE-CAPILLARY: 103 mg/dL — AB (ref 70–99)
Glucose-Capillary: 129 mg/dL — ABNORMAL HIGH (ref 70–99)

## 2018-07-08 MED ORDER — ASPIRIN EC 81 MG PO TBEC: 81.0000 mg | DELAYED_RELEASE_TABLET | Freq: Every day | ORAL | 2 refills | Status: DC

## 2018-07-08 MED ORDER — ASPIRIN EC 81 MG PO TBEC: 81.0000 mg | DELAYED_RELEASE_TABLET | Freq: Two times a day (BID) | ORAL | 2 refills | Status: AC

## 2018-07-08 MED ORDER — TIZANIDINE HCL 2 MG PO CAPS: 2.0000 mg | ORAL_CAPSULE | Freq: Three times a day (TID) | ORAL | 0 refills | Status: DC | PRN

## 2018-07-08 MED ORDER — OXYCODONE HCL 5 MG PO TABS: 5.0000 mg | ORAL_TABLET | ORAL | 0 refills | Status: AC | PRN

## 2018-07-08 NOTE — Progress Notes (Signed)
Physical Therapy Treatment Patient Details Name: [name] MRN: [id number] DOB: 1946-01-11 Today's Date: 07/08/2018    History of Present Illness 72 y/o female s/p elecitve L THA, direct anterior. PMH includes DM, HTN, back surgery, and neck surgery.  Recent R THA 05/17/18, lumbar stenosis with neurogenic claudication, OA    PT Comments    Pt making good progress towards goals. She tolerated increased ambulation distance today and required less assist with mobility. Completed HEP training. Pt expected to d/c home today with HHPT to follow up.    Follow Up Recommendations  Home health PT;Supervision for mobility/OOB;Follow surgeon's recommendation for DC plan and follow-up therapies     Equipment Recommendations  None recommended by PT    Recommendations for Other Services       Precautions / Restrictions Precautions Precautions: Fall;Other (comment)(Direct anterior) Restrictions Weight Bearing Restrictions: Yes Other Position/Activity Restrictions: WBAT    Mobility  Bed Mobility               General bed mobility comments: in chair on arrival  Transfers Overall transfer level: Needs assistance Equipment used: Rolling walker (2 wheeled) Transfers: Sit to/from Stand Sit to Stand: Supervision         General transfer comment: cues for hand placement. Supervision for safety  Ambulation/Gait Ambulation/Gait assistance: Supervision Gait Distance (Feet): 275 Feet Assistive device: Rolling walker (2 wheeled) Gait Pattern/deviations: Step-through pattern;Decreased stride length Gait velocity: reduced   General Gait Details: Slow but overall steady gait with RW. No cues required. Supervision for safety.   Stairs             Wheelchair Mobility    Modified Rankin (Stroke Patients Only)       Balance Overall balance assessment: Needs assistance;History of Falls Sitting-balance support: Feet supported Sitting balance-Leahy Scale: Good      Standing balance support: Bilateral upper extremity supported;During functional activity Standing balance-Leahy Scale: Fair Standing balance comment: able to stand statically w/o UE support                            Cognition Arousal/Alertness: Awake/alert Behavior During Therapy: WFL for tasks assessed/performed Overall Cognitive Status: Within Functional Limits for tasks assessed                                        Exercises Total Joint Exercises Short Arc Quad: AROM;Left;10 reps;Seated Hip ABduction/ADduction: AROM;Left;10 reps;Seated;Standing Long Arc Quad: AROM;Left;10 reps;Seated Knee Flexion: AROM;Left;10 reps;Standing Marching in Standing: AROM;Left;10 reps;Standing Standing Hip Extension: AROM;Left;10 reps;Standing    General Comments        Pertinent Vitals/Pain Pain Assessment: 0-10 Pain Score: 6  Pain Location: L hip Pain Descriptors / Indicators: Operative site guarding Pain Intervention(s): Monitored during session;Limited activity within patient's tolerance;Repositioned;Patient requesting pain meds-RN notified;RN gave pain meds during session    Home Living                      Prior Function            PT Goals (current goals can now be found in the care plan section) Acute Rehab PT Goals Patient Stated Goal: to feel stronger and get back independence PT Goal Formulation: With patient Time For Goal Achievement: 07/21/18 Potential to Achieve Goals: Good Progress towards PT goals: Progressing toward goals    Frequency  7X/week      PT Plan Current plan remains appropriate    Co-evaluation              AM-PAC PT "6 Clicks" Daily Activity  Outcome Measure  Difficulty turning over in bed (including adjusting bedclothes, sheets and blankets)?: A Little Difficulty moving from lying on back to sitting on the side of the bed? : A Little Difficulty sitting down on and standing up from a chair with  arms (e.g., wheelchair, bedside commode, etc,.)?: A Little Help needed moving to and from a bed to chair (including a wheelchair)?: A Little Help needed walking in hospital room?: A Little Help needed climbing 3-5 steps with a railing? : A Little 6 Click Score: 18    End of Session Equipment Utilized During Treatment: Gait belt Activity Tolerance: Patient tolerated treatment well Patient left: with call bell/phone within reach;in chair Nurse Communication: Mobility status PT Visit Diagnosis: Unsteadiness on feet (R26.81);Muscle weakness (generalized) (M62.81);Pain Pain - Right/Left: Left Pain - part of body: Hip     Time: 4782-9562 PT Time Calculation (min) (ACUTE ONLY): 21 min  Charges:  $Gait Training: 8-22 mins                     Benjiman Core, [place] Pager [phone] Acute Rehab   Allena Katz 07/08/2018, 11:41 AM

## 2018-07-08 NOTE — Discharge Summary (Signed)
Patient ID: [name] MRN: [id number] DOB/AGE: [birth year] 70 y.o.  Admit date: 07/06/2018 Discharge date: 07/08/2018  Admission Diagnoses:  Active Problems:   Osteoarthritis of left hip   Primary osteoarthritis of left hip   Discharge Diagnoses:  Same  Past Medical History:  Diagnosis Date  . Arthritis    "knees, ankles, fingers" (07/06/2018)  . Asthma   . Chronic lower back pain   . Dyspnea    with asthma attacks   . Dysrhythmia   . Family history of adverse reaction to anesthesia    my first cousin had difficulty waking up   . Fibroid    ovarian  . GERD (gastroesophageal reflux disease)   . Hip osteoarthritis    Left  . History of staph infection    in hospital for 11 days/ in 2007  . Hyperlipidemia   . Hypertension   . Radiculopathy   . Type II diabetes mellitus ([place])   . Wears glasses     Surgeries: Procedure(s): LEFT TOTAL HIP ARTHROPLASTY ANTERIOR APPROACH on 07/06/2018   Consultants:   Discharged Condition: Improved  Hospital Course: [name] is an 72 y.o. female who was admitted 07/06/2018 for operative treatment of<principal problem not specified>. Patient has severe unremitting pain that affects sleep, daily activities, and work/hobbies. After pre-op clearance the patient was taken to the operating room on 07/06/2018 and underwent  Procedure(s): LEFT TOTAL HIP ARTHROPLASTY ANTERIOR APPROACH.    Patient was given perioperative antibiotics:  Anti-infectives (From admission, onward)   Start     Dose/Rate Route Frequency Ordered Stop   07/06/18 1100  ceFAZolin (ANCEF) IVPB 2g/100 mL premix     2 g 200 mL/hr over 30 Minutes Intravenous To [facility] 07/06/18 1051 07/06/18 1307       Patient was given sequential compression devices, early ambulation, and chemoprophylaxis to prevent DVT.  Patient benefited maximally from hospital stay and there were no complications.    Recent vital signs:  Patient Vitals for the past 24 hrs:  BP Temp  Temp src Pulse Resp SpO2  07/08/18 0421 115/63 97.9 F (36.6 C) Oral 85 16 99 %  07/07/18 2144 119/61 99.3 F (37.4 C) Oral 99 16 99 %  07/07/18 0700 (Abnormal) 106/57 98.4 F (36.9 C) Oral 70 18 100 %     Recent laboratory studies:  Recent Labs    07/07/18 0341 07/08/18 0316  WBC 11.5* 13.4*  HGB 8.5* 8.2*  HCT 27.1* 26.9*  PLT 374 362  NA 136  --   K 4.0  --   CL 102  --   CO2 24  --   BUN 13  --   CREATININE 0.72  --   GLUCOSE 216*  --   CALCIUM 8.4*  --      Discharge Medications:   Allergies as of 07/08/2018    Allergen Reactions Comment   Ivp Dye [iodinated Diagnostic Agents] Nausea And Vomiting    Prednisone Other (See Comments) reflux      Medication List    Stop taking these medications   diclofenac 75 MG EC tablet Commonly known as:  VOLTAREN   oxyCODONE-acetaminophen 5-325 MG tablet Commonly known as:  PERCOCET/ROXICET   tiZANidine 2 MG tablet Commonly known as:  ZANAFLEX Replaced by:  tizanidine 2 MG capsule     Take these medications   acetaminophen 500 MG tablet Commonly known as:  TYLENOL Take 1,000 mg by mouth daily as needed for moderate pain or headache.  albuterol 108 (90 Base) MCG/ACT inhaler Commonly known as:  PROVENTIL HFA;VENTOLIN HFA Inhale 2 puffs into the lungs every 6 (six) hours as needed for wheezing or shortness of breath.   aspirin EC 81 MG tablet Take 1 tablet (81 mg total) by mouth 2 (two) times daily.   bumetanide 1 MG tablet Commonly known as:  BUMEX Take 1 mg by mouth 2 (two) times daily.   cetirizine 10 MG tablet Commonly known as:  ZYRTEC Take 10 mg by mouth daily as needed for allergies (during Spring/Summer).   cholecalciferol 1000 units tablet Commonly known as:  VITAMIN D Take 1,000 Units by mouth daily.   diltiazem 120 MG 24 hr capsule Commonly known as:  CARDIZEM CD Take 120 mg by mouth daily before breakfast.   docusate sodium 100 MG capsule Commonly known as:  COLACE Take 100 mg by mouth  every other day.   enalapril 20 MG tablet Commonly known as:  VASOTEC Take 20 mg by mouth at bedtime.   folic acid 888 MCG tablet Commonly known as:  FOLVITE Take 800 mcg by mouth daily.   gabapentin 300 MG capsule Commonly known as:  NEURONTIN Take 300 mg by mouth 3 (three) times daily.   lovastatin 40 MG tablet Commonly known as:  MEVACOR Take 40 mg by mouth at bedtime.   metFORMIN 500 MG 24 hr tablet Commonly known as:  GLUCOPHAGE-XR Take 500-1,000 mg by mouth See admin instructions. Take 1 tablet (500 mg) in the morning and take 2 tablets (1000 mg) with supper   methotrexate 2.5 MG tablet Commonly known as:  RHEUMATREX Take 12.5 mg by mouth every Sunday at 6pm. Caution:Chemotherapy. Protect from light.   multivitamin with minerals Tabs tablet Take 1 tablet by mouth daily.   oxyCODONE 5 MG immediate release tablet Commonly known as:  Oxy IR/ROXICODONE Take 1-2 tablets (5-10 mg total) by mouth every 4 (four) hours as needed for up to 7 days for severe pain.   ranitidine 300 MG tablet Commonly known as:  ZANTAC Take 300 mg by mouth 2 (two) times daily.   sodium chloride 0.65 % Soln nasal spray Commonly known as:  OCEAN Place 1-2 sprays into the nose 4 (four) times daily as needed for congestion.   tizanidine 2 MG capsule Commonly known as:  ZANAFLEX Take 1 capsule (2 mg total) by mouth 3 (three) times daily as needed for muscle spasms. Replaces:  tiZANidine 2 MG tablet        Durable Medical Equipment  (From admission, onward)         Start     Ordered   07/06/18 2108  DME Walker rolling  Once    Question:  Patient needs a walker to treat with the following condition  Answer:  Status post total hip replacement, left   07/06/18 2107   07/06/18 2108  DME 3 n 1  Once     08 /14/19 2107   07/06/18 2108  DME Bedside commode  Once    Question:  Patient needs a bedside commode to treat with the following condition  Answer:  Status post total hip replacement, left    07/06/18 2107          Diagnostic Studies: Dg C-arm 1-60 Min  Result Date: 07/06/2018 CLINICAL DATA:  LEFT hip arthroplasty EXAM: OPERATIVE LEFT HIP (WITH PELVIS IF PERFORMED) 2 VIEWS TECHNIQUE: Fluoroscopic spot image(s) were submitted for interpretation post-operatively. COMPARISON:  Prior studies FINDINGS: Intraoperative spot films are submitted postoperatively for interpretation.  Frontal views of the LEFT hip demonstrate LEFT total hip replacement without complicating features. IMPRESSION: LEFT total hip arthroplasty without complicating features. Electronically Signed   By: Margarette Canada M.D.   On: 07/06/2018 14:13   Dg Hip Operative Unilat W Or W/o Pelvis Left  Result Date: 07/06/2018 CLINICAL DATA:  LEFT hip arthroplasty EXAM: OPERATIVE LEFT HIP (WITH PELVIS IF PERFORMED) 2 VIEWS TECHNIQUE: Fluoroscopic spot image(s) were submitted for interpretation post-operatively. COMPARISON:  Prior studies FINDINGS: Intraoperative spot films are submitted postoperatively for interpretation. Frontal views of the LEFT hip demonstrate LEFT total hip replacement without complicating features. IMPRESSION: LEFT total hip arthroplasty without complicating features. Electronically Signed   By: Margarette Canada M.D.   On: 07/06/2018 14:13    Disposition: Discharge disposition: 01-Home or Self Care       Discharge Instructions    Call MD / Call 911   Complete by:  As directed    If you experience chest pain or shortness of breath, CALL 911 and be transported to the hospital emergency room.  If you develope a fever above 101 F, pus (white drainage) or increased drainage or redness at the wound, or calf pain, call your surgeon's office.   Constipation Prevention   Complete by:  As directed    Drink plenty of fluids.  Prune juice may be helpful.  You may use a stool softener, such as Colace (over the counter) 100 mg twice a day.  Use MiraLax (over the counter) for constipation as needed.   Diet - low sodium  heart healthy   Complete by:  As directed    Increase activity slowly as tolerated   Complete by:  As directed       Follow-up Information    Frederik Pear, MD Follow up in 2 week(s).   Specialty:  Orthopedic Surgery Contact information: [place] [address] [phone]            Signed: Kerin Salen 07/08/2018, 6:43 AM

## 2018-07-08 NOTE — Progress Notes (Signed)
PATIENT ID: [name]  MRN: [id number]  DOB/AGE:  1946-07-29 / 73 y.o.  2 Days Post-Op Procedure(s) (LRB): LEFT TOTAL HIP ARTHROPLASTY ANTERIOR APPROACH (Left)    PROGRESS NOTE Subjective: Patient is alert, oriented, no Nausea, no Vomiting, yes passing gas, . Taking PO well. Denies SOB, Chest or Calf Pain. Using Incentive Spirometer, PAS in place. Ambulate WBAT x 4 Patient reports pain as  2/10  .    Objective: Vital signs in last 24 hours: Vitals:   07/07/18 0643 07/07/18 0700 07/07/18 2144 07/08/18 0421  BP:  (Abnormal) 106/57 119/61 115/63  Pulse:  70 99 85  Resp:  18 16 16   Temp:  98.4 F (36.9 C) 99.3 F (37.4 C) 97.9 F (36.6 C)  TempSrc:  Oral Oral Oral  SpO2:  100% 99% 99%  Weight: 67.4 kg     Height:          Intake/Output from previous day: I/O last 3 completed shifts: In: 1896.8 [P.O.:460; I.V.:1436.8] Out: 250 [Blood:250]   Intake/Output this shift: No intake/output data recorded.   LABORATORY DATA: Recent Labs    07/07/18 0341  07/07/18 1214 07/07/18 1608 07/07/18 2142 07/08/18 0316  WBC 11.5*  --   --   --   --  13.4*  HGB 8.5*  --   --   --   --  8.2*  HCT 27.1*  --   --   --   --  26.9*  PLT 374  --   --   --   --  362  NA 136  --   --   --   --   --   K 4.0  --   --   --   --   --   CL 102  --   --   --   --   --   CO2 24  --   --   --   --   --   BUN 13  --   --   --   --   --   CREATININE 0.72  --   --   --   --   --   GLUCOSE 216*  --   --   --   --   --   GLUCAP  --    < > 184* 176* 180*  --   CALCIUM 8.4*  --   --   --   --   --    < > = values in this interval not displayed.    Examination: Neurologically intact ABD soft Neurovascular intact Sensation intact distally Intact pulses distally Dorsiflexion/Plantar flexion intact Incision: dressing C/D/I No cellulitis present Compartment soft} XR AP&Lat of hip shows well placed\fixed THA  Assessment:   2 Days Post-Op Procedure(s) (LRB): LEFT TOTAL HIP ARTHROPLASTY ANTERIOR  APPROACH (Left) ADDITIONAL DIAGNOSIS:  Expected Acute Blood Loss Anemia, Diabetes and Hypertension  Plan: PT/OT WBAT, THA  DVT Prophylaxis: SCDx72 hrs, ASA 81 mg BID x 2 weeks  DISCHARGE PLAN: Home, today if passes PT  DISCHARGE NEEDS: HHPT, Walker and 3-in-1 comode seat

## 2018-07-08 NOTE — Progress Notes (Signed)
Pt was ambulating with walker, steady gait. Left hip with Aquacel dressing dry and intact. Pain is controlled with oral narcotics. Discharge instructions was given. Discharged to home accompanied by daughter.

## 2018-07-11 ENCOUNTER — Other Ambulatory Visit: Payer: Self-pay

## 2018-07-11 NOTE — Patient Outreach (Signed)
[facility]) Care Management  [place]  07/11/2018   [name] 1946/10/10 [id number]  Subjective:  Telephone call to patient for follow up left hip surgery. Patient reports that she is doing ok.  She reports some left leg pain over the weekend but reports that it is much better.  Discussed with patient the importance of pain management and not waiting for pain to get bad.  She verbalized understanding.  Patient states she has a follow up with her physician in 12 days and she has transportation.  She states that her dressing is intact and no increased drainage.  Discussed with patient signs of wound infection and importance of notifying physician.  She verbalized understanding.  Patient reports that she will be having in home therapy from [facility] at home.    Objective:   Encounter Medications:  Outpatient Encounter Medications as of 07/11/2018  Medication Sig  . acetaminophen (TYLENOL) 500 MG tablet Take 1,000 mg by mouth daily as needed for moderate pain or headache.  . albuterol (PROVENTIL HFA;VENTOLIN HFA) 108 (90 Base) MCG/ACT inhaler Inhale 2 puffs into the lungs every 6 (six) hours as needed for wheezing or shortness of breath.  Marland Kitchen aspirin EC 81 MG tablet Take 1 tablet (81 mg total) by mouth 2 (two) times daily.  . bumetanide (BUMEX) 1 MG tablet Take 1 mg by mouth 2 (two) times daily.   . cetirizine (ZYRTEC) 10 MG tablet Take 10 mg by mouth daily as needed for allergies (during Spring/Summer).  . cholecalciferol (VITAMIN D) 1000 units tablet Take 1,000 Units by mouth daily.  Marland Kitchen diltiazem (CARDIZEM CD) 120 MG 24 hr capsule Take 120 mg by mouth daily before breakfast.   . docusate sodium (COLACE) 100 MG capsule Take 100 mg by mouth every other day.  . enalapril (VASOTEC) 20 MG tablet Take 20 mg by mouth at bedtime.   . folic acid (FOLVITE) 818 MCG tablet Take 800 mcg by mouth daily.  Marland Kitchen gabapentin (NEURONTIN) 300 MG capsule Take 300 mg by mouth 3 (three) times  daily.   Marland Kitchen lovastatin (MEVACOR) 40 MG tablet Take 40 mg by mouth at bedtime.   . metFORMIN (GLUCOPHAGE-XR) 500 MG 24 hr tablet Take 500-1,000 mg by mouth See admin instructions. Take 1 tablet (500 mg) in the morning and take 2 tablets (1000 mg) with supper  . methotrexate (RHEUMATREX) 2.5 MG tablet Take 12.5 mg by mouth every Sunday at [place]. Protect from light.   . Multiple Vitamin (MULTIVITAMIN WITH MINERALS) TABS tablet Take 1 tablet by mouth daily.  Marland Kitchen oxyCODONE (ROXICODONE) 5 MG immediate release tablet Take 1-2 tablets (5-10 mg total) by mouth every 4 (four) hours as needed for up to 7 days for severe pain.  . ranitidine (ZANTAC) 300 MG tablet Take 300 mg by mouth 2 (two) times daily.   . sodium chloride (OCEAN) 0.65 % SOLN nasal spray Place 1-2 sprays into the nose 4 (four) times daily as needed for congestion.  . tizanidine (ZANAFLEX) 2 MG capsule Take 1 capsule (2 mg total) by mouth 3 (three) times daily as needed for muscle spasms.   No facility-administered encounter medications on file as of 07/11/2018.     Functional Status:  In your present state of health, do you have any difficulty performing the following activities: 07/07/2018 07/07/2018  Hearing? N -  Vision? N -  Difficulty concentrating or making decisions? N -  Walking or climbing stairs? Y -  Dressing or bathing? N -  Doing errands, shopping? N N  Preparing Food and eating ? - -  Using the Toilet? - -  In the past six months, have you accidently leaked urine? - -  Do you have problems with loss of bowel control? - -  Managing your Medications? - -  Managing your Finances? - -  Housekeeping or managing your Housekeeping? - -  Some recent data might be hidden    Fall/Depression Screening: Fall Risk  06/08/2018 02/03/2018 12/29/2017  Falls in the past year? Yes Yes Yes  Number falls in past yr: - - 1  Injury with Fall? - - -  Follow up - - -   PHQ 2/9 Scores 06/08/2018 02/03/2018 12/29/2017 12/01/2017  08/13/2017 06/29/2017 05/27/2017  PHQ - 2 Score 0 0 0 0 0 0 0    Assessment:  Patient continues to benefit from care manager outreach for disease management and support.    Plan:  [facility] CM Care Plan Problem One     Most Recent Value  Care Plan Problem One  Knowledge Deficit Diabetes  Role Documenting the Problem One  Care Management Telephonic Coordinator  Care Plan for Problem One  Active  [facility]N Long Term Goal   Patient will report A1c 5.5 or less within 90 days.  THN Long Term Goal Start Date  06/23/18  Interventions for Problem One Long Term Goal  Patient reports checking sugars regularly.  Discussed importance of sugar control for wound healing.      [facility] CM Care Plan Problem Two     Most Recent Value  Care Plan Problem Two  Recent Left Hip surgery  Role Documenting the Problem Two  Care Management Telephonic Coordinator  Care Plan for Problem Two  Active  Interventions for Problem Two Long Term Goal   RN CM discussed the importance of follow up with physician.  Discussed signs of wound infection and notifying physician.    THN Long Term Goal  Patient will not readmit to the hospital within 31 days.  THN Long Term Goal Start Date  07/11/18     RN CM will contact patient in the month of September and patient agrees to next outreach.  Jone Baseman, RN, MSN [place] [facility] Care Management Coordinator Direct Line [phone] Cell [phone] Toll Free: [phone]  Fax: [phone]

## 2018-07-12 DIAGNOSIS — M5410 Radiculopathy, site unspecified: Secondary | ICD-10-CM | POA: Diagnosis not present

## 2018-07-12 DIAGNOSIS — M150 Primary generalized (osteo)arthritis: Secondary | ICD-10-CM | POA: Diagnosis not present

## 2018-07-12 DIAGNOSIS — M48062 Spinal stenosis, lumbar region with neurogenic claudication: Secondary | ICD-10-CM | POA: Diagnosis not present

## 2018-07-12 DIAGNOSIS — Z471 Aftercare following joint replacement surgery: Secondary | ICD-10-CM | POA: Diagnosis not present

## 2018-07-12 DIAGNOSIS — E119 Type 2 diabetes mellitus without complications: Secondary | ICD-10-CM | POA: Diagnosis not present

## 2018-07-12 DIAGNOSIS — J45909 Unspecified asthma, uncomplicated: Secondary | ICD-10-CM | POA: Diagnosis not present

## 2018-07-12 DIAGNOSIS — Z7984 Long term (current) use of oral hypoglycemic drugs: Secondary | ICD-10-CM | POA: Diagnosis not present

## 2018-07-12 DIAGNOSIS — R471 Dysarthria and anarthria: Secondary | ICD-10-CM | POA: Diagnosis not present

## 2018-07-12 DIAGNOSIS — E785 Hyperlipidemia, unspecified: Secondary | ICD-10-CM | POA: Diagnosis not present

## 2018-07-13 DIAGNOSIS — J45909 Unspecified asthma, uncomplicated: Secondary | ICD-10-CM | POA: Diagnosis not present

## 2018-07-13 DIAGNOSIS — Z471 Aftercare following joint replacement surgery: Secondary | ICD-10-CM | POA: Diagnosis not present

## 2018-07-13 DIAGNOSIS — E785 Hyperlipidemia, unspecified: Secondary | ICD-10-CM | POA: Diagnosis not present

## 2018-07-13 DIAGNOSIS — M5410 Radiculopathy, site unspecified: Secondary | ICD-10-CM | POA: Diagnosis not present

## 2018-07-13 DIAGNOSIS — M150 Primary generalized (osteo)arthritis: Secondary | ICD-10-CM | POA: Diagnosis not present

## 2018-07-13 DIAGNOSIS — Z7984 Long term (current) use of oral hypoglycemic drugs: Secondary | ICD-10-CM | POA: Diagnosis not present

## 2018-07-13 DIAGNOSIS — M48062 Spinal stenosis, lumbar region with neurogenic claudication: Secondary | ICD-10-CM | POA: Diagnosis not present

## 2018-07-13 DIAGNOSIS — R471 Dysarthria and anarthria: Secondary | ICD-10-CM | POA: Diagnosis not present

## 2018-07-13 DIAGNOSIS — E119 Type 2 diabetes mellitus without complications: Secondary | ICD-10-CM | POA: Diagnosis not present

## 2018-07-14 DIAGNOSIS — M150 Primary generalized (osteo)arthritis: Secondary | ICD-10-CM | POA: Diagnosis not present

## 2018-07-14 DIAGNOSIS — M48062 Spinal stenosis, lumbar region with neurogenic claudication: Secondary | ICD-10-CM | POA: Diagnosis not present

## 2018-07-14 DIAGNOSIS — M5410 Radiculopathy, site unspecified: Secondary | ICD-10-CM | POA: Diagnosis not present

## 2018-07-14 DIAGNOSIS — Z471 Aftercare following joint replacement surgery: Secondary | ICD-10-CM | POA: Diagnosis not present

## 2018-07-14 DIAGNOSIS — J45909 Unspecified asthma, uncomplicated: Secondary | ICD-10-CM | POA: Diagnosis not present

## 2018-07-14 DIAGNOSIS — E119 Type 2 diabetes mellitus without complications: Secondary | ICD-10-CM | POA: Diagnosis not present

## 2018-07-14 DIAGNOSIS — Z7984 Long term (current) use of oral hypoglycemic drugs: Secondary | ICD-10-CM | POA: Diagnosis not present

## 2018-07-14 DIAGNOSIS — R471 Dysarthria and anarthria: Secondary | ICD-10-CM | POA: Diagnosis not present

## 2018-07-14 DIAGNOSIS — E785 Hyperlipidemia, unspecified: Secondary | ICD-10-CM | POA: Diagnosis not present

## 2018-07-15 DIAGNOSIS — E119 Type 2 diabetes mellitus without complications: Secondary | ICD-10-CM | POA: Diagnosis not present

## 2018-07-15 DIAGNOSIS — M150 Primary generalized (osteo)arthritis: Secondary | ICD-10-CM | POA: Diagnosis not present

## 2018-07-15 DIAGNOSIS — Z7984 Long term (current) use of oral hypoglycemic drugs: Secondary | ICD-10-CM | POA: Diagnosis not present

## 2018-07-15 DIAGNOSIS — J45909 Unspecified asthma, uncomplicated: Secondary | ICD-10-CM | POA: Diagnosis not present

## 2018-07-15 DIAGNOSIS — R471 Dysarthria and anarthria: Secondary | ICD-10-CM | POA: Diagnosis not present

## 2018-07-15 DIAGNOSIS — M48062 Spinal stenosis, lumbar region with neurogenic claudication: Secondary | ICD-10-CM | POA: Diagnosis not present

## 2018-07-15 DIAGNOSIS — M5410 Radiculopathy, site unspecified: Secondary | ICD-10-CM | POA: Diagnosis not present

## 2018-07-15 DIAGNOSIS — E785 Hyperlipidemia, unspecified: Secondary | ICD-10-CM | POA: Diagnosis not present

## 2018-07-15 DIAGNOSIS — Z471 Aftercare following joint replacement surgery: Secondary | ICD-10-CM | POA: Diagnosis not present

## 2018-07-18 DIAGNOSIS — M48062 Spinal stenosis, lumbar region with neurogenic claudication: Secondary | ICD-10-CM | POA: Diagnosis not present

## 2018-07-18 DIAGNOSIS — Z471 Aftercare following joint replacement surgery: Secondary | ICD-10-CM | POA: Diagnosis not present

## 2018-07-18 DIAGNOSIS — R471 Dysarthria and anarthria: Secondary | ICD-10-CM | POA: Diagnosis not present

## 2018-07-18 DIAGNOSIS — M150 Primary generalized (osteo)arthritis: Secondary | ICD-10-CM | POA: Diagnosis not present

## 2018-07-18 DIAGNOSIS — J45909 Unspecified asthma, uncomplicated: Secondary | ICD-10-CM | POA: Diagnosis not present

## 2018-07-18 DIAGNOSIS — E785 Hyperlipidemia, unspecified: Secondary | ICD-10-CM | POA: Diagnosis not present

## 2018-07-18 DIAGNOSIS — Z7984 Long term (current) use of oral hypoglycemic drugs: Secondary | ICD-10-CM | POA: Diagnosis not present

## 2018-07-18 DIAGNOSIS — E119 Type 2 diabetes mellitus without complications: Secondary | ICD-10-CM | POA: Diagnosis not present

## 2018-07-18 DIAGNOSIS — M5410 Radiculopathy, site unspecified: Secondary | ICD-10-CM | POA: Diagnosis not present

## 2018-07-19 DIAGNOSIS — Z96642 Presence of left artificial hip joint: Secondary | ICD-10-CM | POA: Diagnosis not present

## 2018-07-19 DIAGNOSIS — M1612 Unilateral primary osteoarthritis, left hip: Secondary | ICD-10-CM | POA: Diagnosis not present

## 2018-07-19 DIAGNOSIS — Z471 Aftercare following joint replacement surgery: Secondary | ICD-10-CM | POA: Diagnosis not present

## 2018-07-21 DIAGNOSIS — Z96642 Presence of left artificial hip joint: Secondary | ICD-10-CM | POA: Diagnosis not present

## 2018-07-27 DIAGNOSIS — Z96642 Presence of left artificial hip joint: Secondary | ICD-10-CM | POA: Diagnosis not present

## 2018-07-28 ENCOUNTER — Other Ambulatory Visit: Payer: Self-pay

## 2018-07-28 NOTE — Patient Outreach (Signed)
[place] [facility]) Care Management  [place]  07/28/2018   KIERRAH Carpenter 1946-11-07 [id number]  Subjective: Telephone call to patient.  She is able to verify HIPAA.  Patient reports that she is doing good.  She is doing outpatient therapy.  She does have some tingling to her leg.  She states that the doctor stated that would be normal.  Patient reports she is walking without the cane some but tries to have it with her at all times.  Her sugars continue to do good.  Discussed continuing to taking proper precautions with her hip surgery and continue to monitor sugars.  She verbalized understanding.    Objective:   Encounter Medications:  Outpatient Encounter Medications as of 07/28/2018  Medication Sig  . acetaminophen (TYLENOL) 500 MG tablet Take 1,000 mg by mouth daily as needed for moderate pain or headache.  . albuterol (PROVENTIL HFA;VENTOLIN HFA) 108 (90 Base) MCG/ACT inhaler Inhale 2 puffs into the lungs every 6 (six) hours as needed for wheezing or shortness of breath.  Marland Kitchen aspirin EC 81 MG tablet Take 1 tablet (81 mg total) by mouth 2 (two) times daily.  . bumetanide (BUMEX) 1 MG tablet Take 1 mg by mouth 2 (two) times daily.   . cetirizine (ZYRTEC) 10 MG tablet Take 10 mg by mouth daily as needed for allergies (during Spring/Summer).  . cholecalciferol (VITAMIN D) 1000 units tablet Take 1,000 Units by mouth daily.  Marland Kitchen diltiazem (CARDIZEM CD) 120 MG 24 hr capsule Take 120 mg by mouth daily before breakfast.   . docusate sodium (COLACE) 100 MG capsule Take 100 mg by mouth every other day.  . enalapril (VASOTEC) 20 MG tablet Take 20 mg by mouth at bedtime.   . folic acid (FOLVITE) 631 MCG tablet Take 800 mcg by mouth daily.  Marland Kitchen gabapentin (NEURONTIN) 300 MG capsule Take 300 mg by mouth 3 (three) times daily.   Marland Kitchen lovastatin (MEVACOR) 40 MG tablet Take 40 mg by mouth at bedtime.   . metFORMIN (GLUCOPHAGE-XR) 500 MG 24 hr tablet Take 500-1,000 mg by mouth See admin  instructions. Take 1 tablet (500 mg) in the morning and take 2 tablets (1000 mg) with supper  . methotrexate (RHEUMATREX) 2.5 MG tablet Take 12.5 mg by mouth every Sunday at [place]. Protect from light.   . Multiple Vitamin (MULTIVITAMIN WITH MINERALS) TABS tablet Take 1 tablet by mouth daily.  . ranitidine (ZANTAC) 300 MG tablet Take 300 mg by mouth 2 (two) times daily.   . sodium chloride (OCEAN) 0.65 % SOLN nasal spray Place 1-2 sprays into the nose 4 (four) times daily as needed for congestion.  . tizanidine (ZANAFLEX) 2 MG capsule Take 1 capsule (2 mg total) by mouth 3 (three) times daily as needed for muscle spasms.   No facility-administered encounter medications on file as of 07/28/2018.     Functional Status:  In your present state of health, do you have any difficulty performing the following activities: 07/11/2018 07/07/2018  Hearing? N N  Vision? N N  Difficulty concentrating or making decisions? N N  Walking or climbing stairs? Y Y  Dressing or bathing? N N  Doing errands, shopping? N N  Preparing Food and eating ? N -  Using the Toilet? N -  In the past six months, have you accidently leaked urine? N -  Do you have problems with loss of bowel control? N -  Managing your Medications? N -  Managing your Finances? N -  Housekeeping or managing your Housekeeping? N -  Some recent data might be hidden    Fall/Depression Screening: Fall Risk  06/08/2018 02/03/2018 12/29/2017  Falls in the past year? Yes Yes Yes  Number falls in past yr: - - 1  Injury with Fall? - - -  Follow up - - -   PHQ 2/9 Scores 06/08/2018 02/03/2018 12/29/2017 12/01/2017 08/13/2017 06/29/2017 05/27/2017  PHQ - 2 Score 0 0 0 0 0 0 0    Assessment: Patient continues to benefit from care manager outreach for disease management and support.    Plan:  [facility] CM Care Plan Problem One     Most Recent Value  Care Plan Problem One  Knowledge Deficit Diabetes  Role Documenting the Problem One  Care  Management Telephonic Coordinator  Care Plan for Problem One  Active  THN Long Term Goal   Patient will report A1c 5.5 or less within 90 days.  THN Long Term Goal Start Date  06/23/18  Interventions for Problem One Long Term Goal  Discussed with patient importance of sugar control and monitoring sugars.      [facility] CM Care Plan Problem Two     Most Recent Value  Care Plan Problem Two  Recent Left Hip surgery  Role Documenting the Problem Two  Care Management Telephonic Coordinator  Care Plan for Problem Two  Active  Interventions for Problem Two Long Term Goal   Patient using assistive devices as needed and attending outpatient therapy.  THN Long Term Goal  Patient will not readmit to the hospital within 31 days.  THN Long Term Goal Start Date  07/28/18     RN CM will contact patient again in the month of September and patient agrees to next outreach.  Jone Baseman, RN, MSN [place] Management Care Management Coordinator Direct Line [phone] Cell [phone] Toll Free: [phone]  Fax: [phone]

## 2018-08-02 DIAGNOSIS — Z96642 Presence of left artificial hip joint: Secondary | ICD-10-CM | POA: Diagnosis not present

## 2018-08-04 DIAGNOSIS — Z96642 Presence of left artificial hip joint: Secondary | ICD-10-CM | POA: Diagnosis not present

## 2018-08-05 DIAGNOSIS — Z23 Encounter for immunization: Secondary | ICD-10-CM | POA: Diagnosis not present

## 2018-08-09 DIAGNOSIS — Z96642 Presence of left artificial hip joint: Secondary | ICD-10-CM | POA: Diagnosis not present

## 2018-08-10 ENCOUNTER — Other Ambulatory Visit: Payer: Self-pay

## 2018-08-10 NOTE — Patient Outreach (Signed)
[facility] [facility]) Care Management  [place]  08/10/2018   [name] 1946-11-14 [id number]  Subjective: Telephone call to patient for follow up.  Patient reports she is doing great.  She has 3 more sessions for outpatient therapy and will see physician on next week.  Patient reports that her sugars continue to be controlled.  Discussed with patient case closure on next call as she is meeting goals and doing well.  Patient in agreement.    Objective:   Encounter Medications:  Outpatient Encounter Medications as of 08/10/2018  Medication Sig  . acetaminophen (TYLENOL) 500 MG tablet Take 1,000 mg by mouth daily as needed for moderate pain or headache.  . albuterol (PROVENTIL HFA;VENTOLIN HFA) 108 (90 Base) MCG/ACT inhaler Inhale 2 puffs into the lungs every 6 (six) hours as needed for wheezing or shortness of breath.  Marland Kitchen aspirin EC 81 MG tablet Take 1 tablet (81 mg total) by mouth 2 (two) times daily.  . bumetanide (BUMEX) 1 MG tablet Take 1 mg by mouth 2 (two) times daily.   . cetirizine (ZYRTEC) 10 MG tablet Take 10 mg by mouth daily as needed for allergies (during Spring/Summer).  . cholecalciferol (VITAMIN D) 1000 units tablet Take 1,000 Units by mouth daily.  Marland Kitchen diltiazem (CARDIZEM CD) 120 MG 24 hr capsule Take 120 mg by mouth daily before breakfast.   . docusate sodium (COLACE) 100 MG capsule Take 100 mg by mouth every other day.  . enalapril (VASOTEC) 20 MG tablet Take 20 mg by mouth at bedtime.   . folic acid (FOLVITE) 333 MCG tablet Take 800 mcg by mouth daily.  Marland Kitchen gabapentin (NEURONTIN) 300 MG capsule Take 300 mg by mouth 3 (three) times daily.   Marland Kitchen lovastatin (MEVACOR) 40 MG tablet Take 40 mg by mouth at bedtime.   . metFORMIN (GLUCOPHAGE-XR) 500 MG 24 hr tablet Take 500-1,000 mg by mouth See admin instructions. Take 1 tablet (500 mg) in the morning and take 2 tablets (1000 mg) with supper  . methotrexate (RHEUMATREX) 2.5 MG tablet Take 12.5 mg by mouth every  Sunday at [place]. Protect from light.   . Multiple Vitamin (MULTIVITAMIN WITH MINERALS) TABS tablet Take 1 tablet by mouth daily.  . ranitidine (ZANTAC) 300 MG tablet Take 300 mg by mouth 2 (two) times daily.   . sodium chloride (OCEAN) 0.65 % SOLN nasal spray Place 1-2 sprays into the nose 4 (four) times daily as needed for congestion.  . tizanidine (ZANAFLEX) 2 MG capsule Take 1 capsule (2 mg total) by mouth 3 (three) times daily as needed for muscle spasms.   No facility-administered encounter medications on file as of 08/10/2018.     Functional Status:  In your present state of health, do you have any difficulty performing the following activities: 07/11/2018 07/07/2018  Hearing? N N  Vision? N N  Difficulty concentrating or making decisions? N N  Walking or climbing stairs? Y Y  Dressing or bathing? N N  Doing errands, shopping? N N  Preparing Food and eating ? N -  Using the Toilet? N -  In the past six months, have you accidently leaked urine? N -  Do you have problems with loss of bowel control? N -  Managing your Medications? N -  Managing your Finances? N -  Housekeeping or managing your Housekeeping? N -  Some recent data might be hidden    Fall/Depression Screening: Fall Risk  06/08/2018 02/03/2018 12/29/2017  Falls in the  past year? Yes Yes Yes  Number falls in past yr: - - 1  Injury with Fall? - - -  Follow up - - -   PHQ 2/9 Scores 06/08/2018 02/03/2018 12/29/2017 12/01/2017 08/13/2017 06/29/2017 05/27/2017  PHQ - 2 Score 0 0 0 0 0 0 0    Assessment: Patient continues to benefit from care manager outreach for disease management and support.    Plan:  [facility] CM Care Plan Problem One     Most Recent Value  Care Plan Problem One  Knowledge Deficit Diabetes  Role Documenting the Problem One  Care Management Telephonic Coordinator  Care Plan for Problem One  Active  [facility] Long Term Goal   Patient will report A1c 5.5 or less within 90 days.  [facility]N Long Term Goal  Start Date  08/10/18  Interventions for Problem One Long Term Goal  Patient monitoring sugars and watching diet.      [facility] CM Care Plan Problem Two     Most Recent Value  Care Plan Problem Two  Recent Left Hip surgery  Role Documenting the Problem Two  Care Management Telephonic Coordinator  Care Plan for Problem Two  Active  Interventions for Problem Two Long Term Goal   Patient has had no readmits and doing well with therapy.  [facility]N Long Term Goal  Patient will not readmit to the hospital within 31 days.  [facility]N Long Term Goal Start Date  07/28/18  [facility] Long Term Goal Met Date  08/10/18     RN CM will contact patient in the month of October and patient agrees to next outreach.    Jone Baseman, RN, MSN [place] Management Care Management Coordinator Direct Line [phone] Cell [phone] Toll Free: [phone]  Fax: [phone]

## 2018-08-11 DIAGNOSIS — Z96642 Presence of left artificial hip joint: Secondary | ICD-10-CM | POA: Diagnosis not present

## 2018-08-16 DIAGNOSIS — Z96642 Presence of left artificial hip joint: Secondary | ICD-10-CM | POA: Diagnosis not present

## 2018-08-18 DIAGNOSIS — Z96642 Presence of left artificial hip joint: Secondary | ICD-10-CM | POA: Diagnosis not present

## 2018-08-23 DIAGNOSIS — H40013 Open angle with borderline findings, low risk, bilateral: Secondary | ICD-10-CM | POA: Diagnosis not present

## 2018-08-23 DIAGNOSIS — H26492 Other secondary cataract, left eye: Secondary | ICD-10-CM | POA: Diagnosis not present

## 2018-08-23 DIAGNOSIS — H26491 Other secondary cataract, right eye: Secondary | ICD-10-CM | POA: Diagnosis not present

## 2018-08-23 DIAGNOSIS — E119 Type 2 diabetes mellitus without complications: Secondary | ICD-10-CM | POA: Diagnosis not present

## 2018-08-24 ENCOUNTER — Other Ambulatory Visit: Payer: Self-pay

## 2018-08-24 DIAGNOSIS — M5416 Radiculopathy, lumbar region: Secondary | ICD-10-CM | POA: Diagnosis not present

## 2018-08-24 DIAGNOSIS — Z79899 Other long term (current) drug therapy: Secondary | ICD-10-CM | POA: Diagnosis not present

## 2018-08-24 DIAGNOSIS — M25561 Pain in right knee: Secondary | ICD-10-CM | POA: Diagnosis not present

## 2018-08-24 DIAGNOSIS — Z96642 Presence of left artificial hip joint: Secondary | ICD-10-CM | POA: Diagnosis not present

## 2018-08-24 DIAGNOSIS — M109 Gout, unspecified: Secondary | ICD-10-CM | POA: Diagnosis not present

## 2018-08-24 DIAGNOSIS — M1990 Unspecified osteoarthritis, unspecified site: Secondary | ICD-10-CM | POA: Diagnosis not present

## 2018-08-24 DIAGNOSIS — M0609 Rheumatoid arthritis without rheumatoid factor, multiple sites: Secondary | ICD-10-CM | POA: Diagnosis not present

## 2018-08-24 NOTE — Patient Outreach (Signed)
[place] [facility] [facility]) Care Management  [place]  08/24/2018   [name] 23-Oct-1946 [id number]  Subjective: Telephone call to patient for case closure call. Patient reports she is doing well.  Patient reports she continues therapy 2 days a week.  Patient is looking to return to work part time.  Patient states that she has no pain and is pleased with her results of her surgeries this year.  Patient continues to control sugars and offers no question or concerns about her diabetes.  Discussed case closure for today and advised patient that she could call CM if she has questions or concerns.  She verbalized understanding and is agreeable to case closure.    Objective:   Encounter Medications:  Outpatient Encounter Medications as of 08/24/2018  Medication Sig  . acetaminophen (TYLENOL) 500 MG tablet Take 1,000 mg by mouth daily as needed for moderate pain or headache.  . albuterol (PROVENTIL HFA;VENTOLIN HFA) 108 (90 Base) MCG/ACT inhaler Inhale 2 puffs into the lungs every 6 (six) hours as needed for wheezing or shortness of breath.  Marland Kitchen aspirin EC 81 MG tablet Take 1 tablet (81 mg total) by mouth 2 (two) times daily.  . bumetanide (BUMEX) 1 MG tablet Take 1 mg by mouth 2 (two) times daily.   . cetirizine (ZYRTEC) 10 MG tablet Take 10 mg by mouth daily as needed for allergies (during Spring/Summer).  . cholecalciferol (VITAMIN D) 1000 units tablet Take 1,000 Units by mouth daily.  Marland Kitchen diltiazem (CARDIZEM CD) 120 MG 24 hr capsule Take 120 mg by mouth daily before breakfast.   . docusate sodium (COLACE) 100 MG capsule Take 100 mg by mouth every other day.  . enalapril (VASOTEC) 20 MG tablet Take 20 mg by mouth at bedtime.   . folic acid (FOLVITE) 166 MCG tablet Take 800 mcg by mouth daily.  Marland Kitchen gabapentin (NEURONTIN) 300 MG capsule Take 300 mg by mouth 3 (three) times daily.   Marland Kitchen lovastatin (MEVACOR) 40 MG tablet Take 40 mg by mouth at bedtime.   . metFORMIN (GLUCOPHAGE-XR) 500 MG  24 hr tablet Take 500-1,000 mg by mouth See admin instructions. Take 1 tablet (500 mg) in the morning and take 2 tablets (1000 mg) with supper  . methotrexate (RHEUMATREX) 2.5 MG tablet Take 12.5 mg by mouth every Sunday at [place]. Protect from light.   . Multiple Vitamin (MULTIVITAMIN WITH MINERALS) TABS tablet Take 1 tablet by mouth daily.  . ranitidine (ZANTAC) 300 MG tablet Take 300 mg by mouth 2 (two) times daily.   . sodium chloride (OCEAN) 0.65 % SOLN nasal spray Place 1-2 sprays into the nose 4 (four) times daily as needed for congestion.  . tizanidine (ZANAFLEX) 2 MG capsule Take 1 capsule (2 mg total) by mouth 3 (three) times daily as needed for muscle spasms.   No facility-administered encounter medications on file as of 08/24/2018.     Functional Status:  In your present state of health, do you have any difficulty performing the following activities: 07/11/2018 07/07/2018  Hearing? N N  Vision? N N  Difficulty concentrating or making decisions? N N  Walking or climbing stairs? Y Y  Dressing or bathing? N N  Doing errands, shopping? N N  Preparing Food and eating ? N -  Using the Toilet? N -  In the past six months, have you accidently leaked urine? N -  Do you have problems with loss of bowel control? N -  Managing your Medications? N -  Managing your Finances? N -  Housekeeping or managing your Housekeeping? N -  Some recent data might be hidden    Fall/Depression Screening: Fall Risk  06/08/2018 02/03/2018 12/29/2017  Falls in the past year? Yes Yes Yes  Number falls in past yr: - - 1  Injury with Fall? - - -  Follow up - - -   PHQ 2/9 Scores 06/08/2018 02/03/2018 12/29/2017 12/01/2017 08/13/2017 06/29/2017 05/27/2017  PHQ - 2 Score 0 0 0 0 0 0 0    Assessment: Patient has met goals of care.  Plan:  RN CM will close case and send physician closure letter.    Jone Baseman, RN, MSN [place] Management Care Management Coordinator Direct Line  [phone] Cell [phone] Toll Free: [phone]  Fax: [phone]

## 2018-08-25 DIAGNOSIS — Z96642 Presence of left artificial hip joint: Secondary | ICD-10-CM | POA: Diagnosis not present

## 2018-08-30 DIAGNOSIS — H26491 Other secondary cataract, right eye: Secondary | ICD-10-CM | POA: Diagnosis not present

## 2018-08-30 DIAGNOSIS — Z96642 Presence of left artificial hip joint: Secondary | ICD-10-CM | POA: Diagnosis not present

## 2018-09-01 DIAGNOSIS — Z96642 Presence of left artificial hip joint: Secondary | ICD-10-CM | POA: Diagnosis not present

## 2018-09-02 DIAGNOSIS — M48062 Spinal stenosis, lumbar region with neurogenic claudication: Secondary | ICD-10-CM | POA: Diagnosis not present

## 2018-09-13 DIAGNOSIS — H26492 Other secondary cataract, left eye: Secondary | ICD-10-CM | POA: Diagnosis not present

## 2018-09-27 DIAGNOSIS — M25552 Pain in left hip: Secondary | ICD-10-CM | POA: Diagnosis not present

## 2018-09-27 DIAGNOSIS — Z96642 Presence of left artificial hip joint: Secondary | ICD-10-CM | POA: Diagnosis not present

## 2018-10-05 DIAGNOSIS — J0100 Acute maxillary sinusitis, unspecified: Secondary | ICD-10-CM | POA: Diagnosis not present

## 2018-10-05 DIAGNOSIS — I10 Essential (primary) hypertension: Secondary | ICD-10-CM | POA: Diagnosis not present

## 2018-10-05 DIAGNOSIS — J449 Chronic obstructive pulmonary disease, unspecified: Secondary | ICD-10-CM | POA: Diagnosis not present

## 2018-10-31 DIAGNOSIS — Z1231 Encounter for screening mammogram for malignant neoplasm of breast: Secondary | ICD-10-CM | POA: Diagnosis not present

## 2018-11-24 DIAGNOSIS — I10 Essential (primary) hypertension: Secondary | ICD-10-CM | POA: Diagnosis not present

## 2018-11-24 DIAGNOSIS — E119 Type 2 diabetes mellitus without complications: Secondary | ICD-10-CM | POA: Diagnosis not present

## 2018-11-24 DIAGNOSIS — N189 Chronic kidney disease, unspecified: Secondary | ICD-10-CM | POA: Diagnosis not present

## 2018-11-24 DIAGNOSIS — D649 Anemia, unspecified: Secondary | ICD-10-CM | POA: Diagnosis not present

## 2018-11-24 DIAGNOSIS — Z79899 Other long term (current) drug therapy: Secondary | ICD-10-CM | POA: Diagnosis not present

## 2018-11-24 DIAGNOSIS — M0609 Rheumatoid arthritis without rheumatoid factor, multiple sites: Secondary | ICD-10-CM | POA: Diagnosis not present

## 2018-11-24 DIAGNOSIS — M1990 Unspecified osteoarthritis, unspecified site: Secondary | ICD-10-CM | POA: Diagnosis not present

## 2018-11-24 DIAGNOSIS — M5416 Radiculopathy, lumbar region: Secondary | ICD-10-CM | POA: Diagnosis not present

## 2018-11-24 DIAGNOSIS — M109 Gout, unspecified: Secondary | ICD-10-CM | POA: Diagnosis not present

## 2018-12-16 DIAGNOSIS — M8580 Other specified disorders of bone density and structure, unspecified site: Secondary | ICD-10-CM | POA: Diagnosis not present

## 2018-12-16 DIAGNOSIS — E1122 Type 2 diabetes mellitus with diabetic chronic kidney disease: Secondary | ICD-10-CM | POA: Diagnosis not present

## 2018-12-16 DIAGNOSIS — Z0000 Encounter for general adult medical examination without abnormal findings: Secondary | ICD-10-CM | POA: Diagnosis not present

## 2018-12-16 DIAGNOSIS — M8589 Other specified disorders of bone density and structure, multiple sites: Secondary | ICD-10-CM | POA: Diagnosis not present

## 2018-12-16 DIAGNOSIS — E785 Hyperlipidemia, unspecified: Secondary | ICD-10-CM | POA: Diagnosis not present

## 2018-12-16 DIAGNOSIS — D509 Iron deficiency anemia, unspecified: Secondary | ICD-10-CM | POA: Diagnosis not present

## 2018-12-21 ENCOUNTER — Encounter: Payer: Self-pay | Admitting: Podiatry

## 2018-12-21 ENCOUNTER — Ambulatory Visit: Payer: HMO | Admitting: Podiatry

## 2018-12-21 DIAGNOSIS — L84 Corns and callosities: Secondary | ICD-10-CM

## 2018-12-21 DIAGNOSIS — M79675 Pain in left toe(s): Secondary | ICD-10-CM | POA: Diagnosis not present

## 2018-12-21 DIAGNOSIS — B351 Tinea unguium: Secondary | ICD-10-CM

## 2018-12-21 DIAGNOSIS — M79674 Pain in right toe(s): Secondary | ICD-10-CM | POA: Diagnosis not present

## 2018-12-21 DIAGNOSIS — E119 Type 2 diabetes mellitus without complications: Secondary | ICD-10-CM

## 2018-12-21 NOTE — Patient Instructions (Addendum)
Diabetes Mellitus and Foot Care Foot care is an important part of your health, especially when you have diabetes. Diabetes may cause you to have problems because of poor blood flow (circulation) to your feet and legs, which can cause your skin to:  Become thinner and drier.  Break more easily.  Heal more slowly.  Peel and crack. You may also have nerve damage (neuropathy) in your legs and feet, causing decreased feeling in them. This means that you may not notice minor injuries to your feet that could lead to more serious problems. Noticing and addressing any potential problems early is the best way to prevent future foot problems. How to care for your feet Foot hygiene  Wash your feet daily with warm water and mild soap. Do not use hot water. Then, pat your feet and the areas between your toes until they are completely dry. Do not soak your feet as this can dry your skin.  Trim your toenails straight across. Do not dig under them or around the cuticle. File the edges of your nails with an emery board or nail file.  Apply a moisturizing lotion or petroleum jelly to the skin on your feet and to dry, brittle toenails. Use lotion that does not contain alcohol and is unscented. Do not apply lotion between your toes. Shoes and socks  Wear clean socks or stockings every day. Make sure they are not too tight. Do not wear knee-high stockings since they may decrease blood flow to your legs.  Wear shoes that fit properly and have enough cushioning. Always look in your shoes before you put them on to be sure there are no objects inside.  To break in new shoes, wear them for just a few hours a day. This prevents injuries on your feet. Wounds, scrapes, corns, and calluses  Check your feet daily for blisters, cuts, bruises, sores, and redness. If you cannot see the bottom of your feet, use a mirror or ask someone for help.  Do not cut corns or calluses or try to remove them with medicine.  If you  find a minor scrape, cut, or break in the skin on your feet, keep it and the skin around it clean and dry. You may clean these areas with mild soap and water. Do not clean the area with peroxide, alcohol, or iodine.  If you have a wound, scrape, corn, or callus on your foot, look at it several times a day to make sure it is healing and not infected. Check for: ? Redness, swelling, or pain. ? Fluid or blood. ? Warmth. ? Pus or a bad smell. General instructions  Do not cross your legs. This may decrease blood flow to your feet.  Do not use heating pads or hot water bottles on your feet. They may burn your skin. If you have lost feeling in your feet or legs, you may not know this is happening until it is too late.  Protect your feet from hot and cold by wearing shoes, such as at the beach or on hot pavement.  Schedule a complete foot exam at least once a year (annually) or more often if you have foot problems. If you have foot problems, report any cuts, sores, or bruises to your health care provider immediately. Contact a health care provider if:  You have a medical condition that increases your risk of infection and you have any cuts, sores, or bruises on your feet.  You have an injury that is not   healing.  You have redness on your legs or feet.  You feel burning or tingling in your legs or feet.  You have pain or cramps in your legs and feet.  Your legs or feet are numb.  Your feet always feel cold.  You have pain around a toenail. Get help right away if:  You have a wound, scrape, corn, or callus on your foot and: ? You have pain, swelling, or redness that gets worse. ? You have fluid or blood coming from the wound, scrape, corn, or callus. ? Your wound, scrape, corn, or callus feels warm to the touch. ? You have pus or a bad smell coming from the wound, scrape, corn, or callus. ? You have a fever. ? You have a red line going up your leg. Summary  Check your feet every day  for cuts, sores, red spots, swelling, and blisters.  Moisturize feet and legs daily.  Wear shoes that fit properly and have enough cushioning.  If you have foot problems, report any cuts, sores, or bruises to your health care provider immediately.  Schedule a complete foot exam at least once a year (annually) or more often if you have foot problems. This information is not intended to replace advice given to you by your health care provider. Make sure you discuss any questions you have with your health care provider. Document Released: 11/06/2000 Document Revised: 12/22/2017 Document Reviewed: 12/11/2016 Elsevier Interactive Patient Education  2019 Elsevier Inc.  Onychomycosis/Fungal Toenails  WHAT IS IT? An infection that lies within the keratin of your nail plate that is caused by a fungus.  WHY ME? Fungal infections affect all ages, sexes, races, and creeds.  There may be many factors that predispose you to a fungal infection such as age, coexisting medical conditions such as diabetes, or an autoimmune disease; stress, medications, fatigue, genetics, etc.  Bottom line: fungus thrives in a warm, moist environment and your shoes offer such a location.  IS IT CONTAGIOUS? Theoretically, yes.  You do not want to share shoes, nail clippers or files with someone who has fungal toenails.  Walking around barefoot in the same room or sleeping in the same bed is unlikely to transfer the organism.  It is important to realize, however, that fungus can spread easily from one nail to the next on the same foot.  HOW DO WE TREAT THIS?  There are several ways to treat this condition.  Treatment may depend on many factors such as age, medications, pregnancy, liver and kidney conditions, etc.  It is best to ask your doctor which options are available to you.  1. No treatment.   Unlike many other medical concerns, you can live with this condition.  However for many people this can be a painful condition and  may lead to ingrown toenails or a bacterial infection.  It is recommended that you keep the nails cut short to help reduce the amount of fungal nail. 2. Topical treatment.  These range from herbal remedies to prescription strength nail lacquers.  About 40-50% effective, topicals require twice daily application for approximately 9 to 12 months or until an entirely new nail has grown out.  The most effective topicals are medical grade medications available through physicians offices. 3. Oral antifungal medications.  With an 80-90% cure rate, the most common oral medication requires 3 to 4 months of therapy and stays in your system for a year as the new nail grows out.  Oral antifungal medications do require   blood work to make sure it is a safe drug for you.  A liver function panel will be performed prior to starting the medication and after the first month of treatment.  It is important to have the blood work performed to avoid any harmful side effects.  In general, this medication safe but blood work is required. 4. Laser Therapy.  This treatment is performed by applying a specialized laser to the affected nail plate.  This therapy is noninvasive, fast, and non-painful.  It is not covered by insurance and is therefore, out of pocket.  The results have been very good with a 80-95% cure rate.  The [facility] is the only practice in the area to offer this therapy. 5. Permanent Nail Avulsion.  Removing the entire nail so that a new nail will not grow back. 

## 2018-12-23 DIAGNOSIS — I700 Atherosclerosis of aorta: Secondary | ICD-10-CM | POA: Diagnosis not present

## 2018-12-23 DIAGNOSIS — M0609 Rheumatoid arthritis without rheumatoid factor, multiple sites: Secondary | ICD-10-CM | POA: Diagnosis not present

## 2018-12-23 DIAGNOSIS — I10 Essential (primary) hypertension: Secondary | ICD-10-CM | POA: Diagnosis not present

## 2018-12-23 DIAGNOSIS — Z0000 Encounter for general adult medical examination without abnormal findings: Secondary | ICD-10-CM | POA: Diagnosis not present

## 2018-12-23 DIAGNOSIS — D509 Iron deficiency anemia, unspecified: Secondary | ICD-10-CM | POA: Diagnosis not present

## 2018-12-23 DIAGNOSIS — E1142 Type 2 diabetes mellitus with diabetic polyneuropathy: Secondary | ICD-10-CM | POA: Diagnosis not present

## 2018-12-23 DIAGNOSIS — J449 Chronic obstructive pulmonary disease, unspecified: Secondary | ICD-10-CM | POA: Diagnosis not present

## 2018-12-23 DIAGNOSIS — E1122 Type 2 diabetes mellitus with diabetic chronic kidney disease: Secondary | ICD-10-CM | POA: Diagnosis not present

## 2018-12-23 DIAGNOSIS — R002 Palpitations: Secondary | ICD-10-CM | POA: Diagnosis not present

## 2018-12-23 DIAGNOSIS — E782 Mixed hyperlipidemia: Secondary | ICD-10-CM | POA: Diagnosis not present

## 2018-12-23 DIAGNOSIS — N182 Chronic kidney disease, stage 2 (mild): Secondary | ICD-10-CM | POA: Diagnosis not present

## 2018-12-30 NOTE — Progress Notes (Signed)
Subjective: [name] presents today with painful, thick toenails 1-5 b/l that she cannot cut and which interfere with daily activities.  Pain is aggravated when wearing enclosed shoe gear.  She states she has had two hip surgeries since her last visit. She is recovering well.  Jani Gravel, MD is her PCP.   Current Outpatient Medications:  .  acetaminophen (TYLENOL) 500 MG tablet, Take 1,000 mg by mouth daily as needed for moderate pain or headache., Disp: , Rfl:  .  albuterol (PROVENTIL HFA;VENTOLIN HFA) 108 (90 Base) MCG/ACT inhaler, Inhale 2 puffs into the lungs every 6 (six) hours as needed for wheezing or shortness of breath., Disp: , Rfl:  .  aspirin EC 81 MG tablet, Take 1 tablet (81 mg total) by mouth 2 (two) times daily., Disp: 30 tablet, Rfl: 2 .  azithromycin (ZITHROMAX) 250 MG tablet, TK 2 TS PO FOR 1 DAY THEN TK 1 T PO D FOR 4 DAYS, Disp: , Rfl:  .  bumetanide (BUMEX) 1 MG tablet, Take 1 mg by mouth 2 (two) times daily. , Disp: , Rfl:  .  cetirizine (ZYRTEC) 10 MG tablet, Take 10 mg by mouth daily as needed for allergies (during Spring/Summer)., Disp: , Rfl:  .  cholecalciferol (VITAMIN D) 1000 units tablet, Take 1,000 Units by mouth daily., Disp: , Rfl:  .  diltiazem (CARDIZEM CD) 120 MG 24 hr capsule, Take 120 mg by mouth daily before breakfast. , Disp: , Rfl:  .  docusate sodium (COLACE) 100 MG capsule, Take 100 mg by mouth every other day., Disp: , Rfl:  .  enalapril (VASOTEC) 20 MG tablet, Take 20 mg by mouth at bedtime. , Disp: , Rfl:  .  folic acid (FOLVITE) 413 MCG tablet, Take 800 mcg by mouth daily., Disp: , Rfl:  .  gabapentin (NEURONTIN) 300 MG capsule, Take 300 mg by mouth 3 (three) times daily. , Disp: , Rfl:  .  lovastatin (MEVACOR) 40 MG tablet, Take 40 mg by mouth at bedtime. , Disp: , Rfl:  .  metFORMIN (GLUCOPHAGE-XR) 500 MG 24 hr tablet, Take 500-1,000 mg by mouth See admin instructions. Take 1 tablet (500 mg) in the morning and take 2 tablets (1000 mg) with  supper, Disp: , Rfl:  .  methotrexate (RHEUMATREX) 2.5 MG tablet, Take 12.5 mg by mouth every Sunday at [place]. Protect from light. , Disp: , Rfl:  .  Multiple Vitamin (MULTIVITAMIN WITH MINERALS) TABS tablet, Take 1 tablet by mouth daily., Disp: , Rfl:  .  ranitidine (ZANTAC) 300 MG tablet, Take 300 mg by mouth 2 (two) times daily. , Disp: , Rfl:  .  sodium chloride (OCEAN) 0.65 % SOLN nasal spray, Place 1-2 sprays into the nose 4 (four) times daily as needed for congestion., Disp: , Rfl:  .  tizanidine (ZANAFLEX) 2 MG capsule, Take 1 capsule (2 mg total) by mouth 3 (three) times daily as needed for muscle spasms., Disp: 60 capsule, Rfl: 0  Allergies  Allergen Reactions  . Ivp Dye [Iodinated Diagnostic Agents] Nausea And Vomiting  . Prednisone Other (See Comments)    reflux    Objective:  Vascular Examination: Capillary refill time <3 seconds  x 10 digits  Dorsalis pedis and Posterior tibial pulses palpable b/l  Digital hair sparse x 10 digits  Skin temperature gradient WNL b/l  Dermatological Examination: Skin with normal turgor, texture and tone b/l  Toenails 1-5 b/l discolored, thick, dystrophic with subungual debris and pain with palpation to  nailbeds due to thickness of nails.  Left 5th digit PIPJ with hyperkeratotic lesion. No erythema, no edema, no drainage. No flocculence.  Musculoskeletal: Muscle strength 5/5 to all LE muscle groups  No gross bony deformities b/l.  No pain, crepitus or joint limitation noted with ROM.   Neurological: Sensation intact with 10 gram monofilament. Vibratory sensation intact.  Assessment: Painful onychomycosis toenails 1-5 b/l  Corn left 5th digit  NIDDM  Plan: 1. Toenails 1-5 b/l were debrided in length and girth without iatrogenic bleeding. 2. Corn pared with sterile scalpel blade left 5th toe. 3. Patient to continue soft, supportive shoe gear 4. Patient to report any pedal injuries to medical professional  immediately. 5. Follow up 3 months. Patient/POA to call should there be a concern in the interim.

## 2019-01-10 DIAGNOSIS — M25552 Pain in left hip: Secondary | ICD-10-CM | POA: Diagnosis not present

## 2019-02-10 ENCOUNTER — Other Ambulatory Visit: Payer: Self-pay

## 2019-02-10 NOTE — Patient Outreach (Signed)
  [place] [facility]) Care Management Chronic Special Needs Program  02/10/2019  Name: [name] DOB: Sep 03, 1946  MRN: [id number]  [name] is enrolled in a chronic special needs plan for Diabetes. Chronic Care Management Coordinator telephoned client to review health risk assessment and to develop individualized care plan.  Introduced the chronic care management program, importance of client participation, and taking their care plan to all provider appointments and inpatient facilities.  Reviewed the transition of care process and possible referral to community care management.  Subjective: Client states she has not been in the hospital since last August when she had hip surgery.  States she has lost 22 lbs in the last year and her blood sugars have been ranging from 85-95.  States she has not been active recently since her part time job is on hold now  Goals Addressed            This Visit's Progress   .  Diabetes Patient stated goal to walk 30 minutes 3 times a week for the next 6 months (pt-stated)      . Client understands the importance of follow-up with providers by attending scheduled visits      . Client will report no worsening of symptoms related to heart disease within the next 6 months       . Client will use Assistive Devices as needed and verbalize understanding of device use      . Client will verbalize knowledge of chronic lung disease as evidenced by no ED visits or Inpatient stays related to chronic lung disease       . Client will verbalize knowledge of self management of Hypertension as evidences by BP reading of 140/90 or less; or as defined by provider      . HEMOGLOBIN A1C < 7.0       Diabetes self management actions:  Glucose monitoring per provider recommendations  Perform Quality checks on blood meter  Eat Healthy  Check feet daily  Visit provider every 3-6 months as directed  Hbg A1C level every 3-6 months.  Eye Exam yearly    .  Maintain timely refills of diabetic medication as prescribed within the year .      Marland Kitchen Obtain annual  Lipid Profile, LDL-C      . Obtain Annual Eye (retinal)  Exam       . Obtain Annual Foot Exam      . Obtain annual screen for micro albuminuria (urine) , nephropathy (kidney problems)      . Obtain Hemoglobin A1C at least 2 times per year      . Visit Primary Care Provider or Endocrinologist at least 2 times per year        Client is  meeting diabetes self management goal of hemoglobin A1C of <7% with last reading of 5.3%.  Client agreeable to referral to pharmacy for >8 medications.  Plan:  Send successful outreach letter with a copy of their individualized care plan, Send individual care plan to provider and Send educational material  Chronic care management coordination will outreach in:  6 Months  Will refer client to:  Pharmacy   Anne Garter RN, [facility], [place] Management [phone]

## 2019-02-13 ENCOUNTER — Telehealth: Payer: Self-pay | Admitting: Pharmacist

## 2019-02-13 NOTE — Patient Outreach (Signed)
[facility]) Care Management  [place]   02/13/2019  [name] June 30, 1946 [id number]  Reason for referral: Medication Assistance, Medication Adherence  Referral source: [employer] Current insurance: [employer] C-SNP  PMHx includes but not limited to:   Type 2 diabetes, Hypertension ,and  Osteoarthritis of both hips.  Outreach:  Successful telephone call with .  HIPAA identifiers verified.   Subjective:   Objective: Lab Results  Component Value Date   CREATININE 0.72 07/07/2018   CREATININE 0.84 06/24/2018   CREATININE 0.70 05/19/2018    Lab Results  Component Value Date   HGBA1C 5.3 07/07/2018    Lipid Panel  No results found for: CHOL, TRIG, HDL, CHOLHDL, VLDL, LDLCALC, LDLDIRECT  BP Readings from Last 3 Encounters:  07/08/18 115/63  06/24/18 115/71  05/20/18 116/67    Allergies  Allergen Reactions  . Ivp Dye [Iodinated Diagnostic Agents] Nausea And Vomiting  . Prednisone Other (See Comments)    reflux    Medications Reviewed Today    Reviewed by Elayne Guerin, [facility] (Pharmacist) on 02/13/19 at 1558  Med List Status: <None>  Medication Order Taking? Sig Documenting Provider Last Dose Status Informant  acetaminophen (TYLENOL) 500 MG tablet [id number] Yes Take 1,000 mg by mouth daily as needed for moderate pain or headache. [provider] Taking Active Self  albuterol (PROVENTIL HFA;VENTOLIN HFA) 108 (90 Base) MCG/ACT inhaler [id number] Yes Inhale 2 puffs into the lungs every 6 (six) hours as needed for wheezing or shortness of breath. [provider] Taking Active Self  aspirin EC 81 MG tablet [id number] Yes Take 1 tablet (81 mg total) by mouth 2 (two) times daily.  Patient taking differently:  Take 81 mg by mouth every other day.    Frederik Pear, MD Taking Active         Discontinued 02/13/19 1554 (Completed Course)   bumetanide (BUMEX) 1 MG tablet [id number] Yes Take 1 mg by mouth 2 (two)  times daily.  [provider] Taking Active Self  cetirizine (ZYRTEC) 10 MG tablet [id number] Yes Take 10 mg by mouth daily as needed for allergies (during Spring/Summer). [provider] Taking Active Self  cholecalciferol (VITAMIN D) 1000 units tablet [id number] Yes Take 1,000 Units by mouth daily. [provider] Taking Active Self  diclofenac (VOLTAREN) 75 MG EC tablet [id number] Yes Take 1 tablet by mouth 2 (two) times daily. [provider] Taking Active   diltiazem (CARDIZEM CD) 120 MG 24 hr capsule [id number] Yes Take 120 mg by mouth daily before breakfast.  [provider] Taking Active Self  docusate sodium (COLACE) 100 MG capsule [id number] Yes Take 100 mg by mouth every other day. [provider] Taking Active Self  enalapril (VASOTEC) 20 MG tablet [id number] Yes Take 20 mg by mouth at bedtime.  [provider] Taking Active Self           Med Note Tami Lin Jan 24, 2018  9:14 AM)    Fe Fum-FePoly-Vit C-Vit B3 (INTEGRA) 62.5-62.5-40-3 MG CAPS [id number] Yes Take 1 capsule by mouth daily. [provider] Taking Active   folic acid (FOLVITE) 450 MCG tablet [id number] Yes Take 800 mcg by mouth daily. [provider] Taking Active Self  gabapentin (NEURONTIN) 300 MG capsule [id number] Yes Take 300 mg by mouth daily.  [provider] Taking Active Self           Med Note Kenton Kingfisher, Enrique Sack T  Mon Jan 24, 2018  9:14 AM)    lovastatin (MEVACOR) 40 MG tablet [id number] Yes Take 40 mg by mouth at bedtime.  [provider] Taking Active Self           Med Note Tami Lin Jan 24, 2018  9:14 AM)    metFORMIN (GLUCOPHAGE-XR) 500 MG 24 hr tablet [id number] Yes Take 500-1,000 mg by mouth See admin instructions. Take 1 tablet (500 mg) in the morning and take 2 tablets (1000 mg) with supper [provider] Taking Active Self  methotrexate (RHEUMATREX) 2.5 MG tablet [id number] Yes Take  12.5 mg by mouth every Sunday at [place]. Protect from light.  [provider] Taking Active Self           Med Note Tami Lin Jan 24, 2018  9:15 AM)    Multiple Vitamin (MULTIVITAMIN WITH MINERALS) TABS tablet [id number] Yes Take 1 tablet by mouth daily. [provider] Taking Active Self  ranitidine (ZANTAC) 300 MG tablet [id number] Yes Take 300 mg by mouth 2 (two) times daily.  [provider] Taking Active Self           Med Note Kenton Kingfisher, Lyndel Pleasure Jan 24, 2018  9:15 AM)    sodium chloride (OCEAN) 0.65 % SOLN nasal spray [id number] Yes Place 1-2 sprays into the nose 4 (four) times daily as needed for congestion. [provider] Taking Active Self           Med Note Corinna Lines Feb 13, 2019  3:58 PM) PRN ONLY       Patient not taking:       Discontinued 02/13/19 1558 (Completed Course)           Assessment:  Drugs sorted by system:  Neurologic/Psychologic: Gabapentin,   Cardiovascular: ASA, Bumetanide, Enalapril, Lovastatin,   Pulmonary/Allergy: Proventil HFA, Cetirizine,, Scdium Chloride Nasal spray   Gastrointestinal: Docusate Sodium, Ranitidine  Endocrine: Metformin  Pain: Acetaminophen, Diclofenac,   Vitamins/Minerals/Supplements: Cholecalciferol, Integra, Folic Acid, Multiple Vitamins,   Misc: Methotrexate  Medication Review Findings:  . Drug/Drug Interaction-Lovstatin/Diltizaem increased risk of myopathy . Dose Too High-Lovsastatin-dose of lovastatin should not exceed 72m when given with Diltiazem due to the manufacturer dosing guidelines. . Monitoring needed-patient is on the "tripple whammy" Diuretic (bumetanide), ACE (ramipril) and NSAID (voltaren). Patient's provider is not on Epic so her most recent lab values are not available.    Medication Assistance Findings:  Medication assistance needs identified.   Extra Help:  Not eligible for Extra Help Low Income Subsidy  based on reported income and assets  Patient Assistance Programs: Proventil HFA made by MDIRECTVo Income requirement met: Yes o Out-of-pocket prescription expenditure met:   Not Applicable - Patient has met application requirements to apply for this patient assistance program.     Additional medication assistance options reviewed with patient as warranted:  No other options identified  Plan: I will route patient assistance letter to TRuidosotechnician who will coordinate patient assistance program application process for medications listed above.  [facility] will assist with obtaining all required documents from both patient and provider(s) and submit application(s) once completed. , Will route note to PCP. , Will follow-up in 3 weeks    KElayne Guerin PharmD, BWestmontClinical Pharmacist [phone]

## 2019-02-15 ENCOUNTER — Other Ambulatory Visit: Payer: Self-pay | Admitting: Pharmacy Technician

## 2019-02-15 NOTE — Patient Outreach (Signed)
[place] [facility]) Care Management  02/15/2019  KYA MAYFIELD [birth year] [id number]                          Medication Assistance Referral  Referral From: [place]  Medication/Company: Proventil HFA / Merck Patient application portion:  Education officer, museum portion:  Mailed to Dr. Merrilee Seashore  Follow up:  Will follow up with patient in 5-10 business days to confirm application(s) have been received.  Jameca Chumley P. Tobi Leinweber, [place] Management [phone]

## 2019-02-27 ENCOUNTER — Other Ambulatory Visit: Payer: Self-pay | Admitting: Pharmacy Technician

## 2019-02-27 NOTE — Patient Outreach (Signed)
[place] [facility]) Care Management  02/27/2019  YARIELA TISON Jan 16, 1946 [id number]   Unsuccessful outreach call placed to patient in regards to Merck application for Proventil.  Unfortunately patient did not answer the phone, HIPAA compliant voicemail left.  Was calling patient to inquire if she had received her patient assistance application in the mail.  Will followup in 2-3 business days with 2nd outreach call if call is not returned. Zyrell Carmean P. Naileah Karg, [place] Management [phone]

## 2019-03-01 ENCOUNTER — Other Ambulatory Visit: Payer: Self-pay | Admitting: Pharmacy Technician

## 2019-03-01 NOTE — Patient Outreach (Signed)
[place] [facility]) Care Management  03/01/2019  [name] 1946/08/31 [id number]  Second unsuccessful outreach call placed to patient in regards to Merck application for Proventil.  Unfortunately patient did not answer the phone.  Was calling patient to inquire if she had received the application in the mail.  Will followup with 3rd call attempt in 5-7 business days if call is not returned.  [name] P. Halana Deisher, [place] Management [phone]

## 2019-03-08 ENCOUNTER — Ambulatory Visit: Payer: Self-pay | Admitting: Pharmacist

## 2019-03-09 ENCOUNTER — Other Ambulatory Visit: Payer: Self-pay | Admitting: Pharmacy Technician

## 2019-03-09 DIAGNOSIS — M0609 Rheumatoid arthritis without rheumatoid factor, multiple sites: Secondary | ICD-10-CM | POA: Diagnosis not present

## 2019-03-09 DIAGNOSIS — Z79899 Other long term (current) drug therapy: Secondary | ICD-10-CM | POA: Diagnosis not present

## 2019-03-09 DIAGNOSIS — M5416 Radiculopathy, lumbar region: Secondary | ICD-10-CM | POA: Diagnosis not present

## 2019-03-09 DIAGNOSIS — D649 Anemia, unspecified: Secondary | ICD-10-CM | POA: Diagnosis not present

## 2019-03-09 DIAGNOSIS — M1990 Unspecified osteoarthritis, unspecified site: Secondary | ICD-10-CM | POA: Diagnosis not present

## 2019-03-09 DIAGNOSIS — I10 Essential (primary) hypertension: Secondary | ICD-10-CM | POA: Diagnosis not present

## 2019-03-09 DIAGNOSIS — N189 Chronic kidney disease, unspecified: Secondary | ICD-10-CM | POA: Diagnosis not present

## 2019-03-09 DIAGNOSIS — Z8739 Personal history of other diseases of the musculoskeletal system and connective tissue: Secondary | ICD-10-CM | POA: Diagnosis not present

## 2019-03-09 DIAGNOSIS — M79643 Pain in unspecified hand: Secondary | ICD-10-CM | POA: Diagnosis not present

## 2019-03-09 DIAGNOSIS — E119 Type 2 diabetes mellitus without complications: Secondary | ICD-10-CM | POA: Diagnosis not present

## 2019-03-09 NOTE — Patient Outreach (Signed)
[place] [facility]) Care Management  03/09/2019  [name] [birth year] [id number]    Successful outreach call placed to patient in regards to [employer]ck patient assistance for Proventil.  Spoke to patient, HIPAA identifiers verified.  Patient informed she had received the application and had sent them back into [facility] 2 days ago.  Will followup with patient in 10-14 business days if application has not been received back.  Neima Lacross P. Bernabe Dorce, [place] Management [phone]

## 2019-03-21 ENCOUNTER — Other Ambulatory Visit: Payer: Self-pay | Admitting: Pharmacy Technician

## 2019-03-21 NOTE — Patient Outreach (Signed)
[place] [facility]) Care Management  03/21/2019  [name] 1946/10/26 [id number]    Unsuccessful outreach call placed to patient in regards to Merck application for Proventil.  Unfortunately patient did not answer the phone. HIPAA compliant voice mail left.  Was calling patient to inquire if she had mailed back the application that she received per phone call 03/09/2019. We received  A [employer] consent form and her proof of income but no application.  Will followup with 2nd outreach attemtp in 2-3 business days.  [name] P. [name], [place] Management [phone]

## 2019-03-22 ENCOUNTER — Other Ambulatory Visit: Payer: Self-pay | Admitting: Pharmacy Technician

## 2019-03-22 ENCOUNTER — Ambulatory Visit: Payer: HMO | Admitting: Podiatry

## 2019-03-22 NOTE — Patient Outreach (Signed)
[place] [facility]) Care Management  03/22/2019  [name] Mar 24, 1946 [id number]   ADDENDUM  Incoming call received from [place] at Dr. Mathis Fare office in regards to patient's Merck application for Proventil HFA.  [name] informed they have not received any Merck forms from [facility]. She inquired as to why we were applying for patient assistance as Proventil HFA can also come as Ventolin HFA, Proair HFA and Albuterol HFA and depending on her insurance she could have a cheaper copay. [name] also informed that Myles Rosenthal would be an option for the patient instead of patient assistance. Informed [name] that patient had expressed a concern with the affordability of that medication with our [facility].  [name] informed  she would reach out to the patient.  Inquired if [name] would like for [facility] to stay involved in the patient care in regards to patient assistance and if she would like for [facility] to mail another form over to the office. [name] informed we can mail another form but she would reach out to the patient to see how the patient  would like to proceed and will determine based on that conversation if the form will be sent back to [facility].  Will route note to [place] with assistance. Will mail another Merck application to the Provider's office when back in the office on Thursday 03/23/2019 and will followup accordingly.  Juleah Paradise P. Dashayla Theissen, [place] Management [phone]

## 2019-03-22 NOTE — Patient Outreach (Signed)
[place] [facility]) Care Management  03/22/2019  ALESHA JAFFEE October 27, 1946 [id number]   Care coordination call placed to Dr. Mathis Fare office in regards to [employer] patient assistance for Proventil HFA.  Spoke to [place] who took a message to send back to the clinical staff. Informed [name] I was inquiring on the status of the Merck patient assistance application that was mailed on 02/15/2019.   Will followup with office in 5-7 business days if call is not returned.  Launi Asencio P. Jaben Benegas, [place] Management [phone]

## 2019-03-24 ENCOUNTER — Other Ambulatory Visit: Payer: Self-pay | Admitting: Pharmacy Technician

## 2019-03-24 DIAGNOSIS — E1122 Type 2 diabetes mellitus with diabetic chronic kidney disease: Secondary | ICD-10-CM | POA: Diagnosis not present

## 2019-03-24 DIAGNOSIS — D509 Iron deficiency anemia, unspecified: Secondary | ICD-10-CM | POA: Diagnosis not present

## 2019-03-24 DIAGNOSIS — I700 Atherosclerosis of aorta: Secondary | ICD-10-CM | POA: Diagnosis not present

## 2019-03-24 NOTE — Patient Outreach (Signed)
[place] [facility]) Care Management  03/24/2019  [name] 02/22/1946 [id number]   Successful outreach call placed to patient in regards to [employer] patient assistance for Proventil HFA.  Spoke to patient, HIPAA identifiers verified. Patient informed that [phone] was the best phone number to reach her on going forward.  Inquired if patient had received the application that we mailed her. Patient informed only receiving the one packet of information and that if she received anything else then she probably trashed it because she did not realize she would be receiving 2 different packets from [facility].   Apologized for the confusion for her and informed patient that I would mail her another application when I am in the office on Tuesday Mar 28, 2019. Informed patient that it would be coming in a HTA envelope. Patient was agreeable and verbalized understanding.  Will followup with patient in 5-7 days FROM  03/28/2019 to inquire if she has received the application.  Tage Feggins P. Anyelo Mccue, [place] Management [phone]

## 2019-03-27 ENCOUNTER — Other Ambulatory Visit: Payer: Self-pay | Admitting: Pharmacy Technician

## 2019-03-27 NOTE — Patient Outreach (Signed)
[place] [facility]) Care Management  03/27/2019  [name] 1946/10/09 [id number]  Incoming call received from patient in regards to Merck application for Proventil.  Spoke to patient, HIPAA identifiers verified.  Patient informed after doing some cleaning this weekend she found the initial Merck application that was mailed to her. She informed she would complete the application and place it in the mail today.  Will followup with patient in 10-14 business days if application has not been received back.  Keon Benscoter P. Aracelly Tencza, [place] Management [phone]

## 2019-03-29 ENCOUNTER — Ambulatory Visit: Payer: Self-pay | Admitting: Pharmacist

## 2019-03-31 DIAGNOSIS — E782 Mixed hyperlipidemia: Secondary | ICD-10-CM | POA: Diagnosis not present

## 2019-03-31 DIAGNOSIS — Z7189 Other specified counseling: Secondary | ICD-10-CM | POA: Diagnosis not present

## 2019-03-31 DIAGNOSIS — I10 Essential (primary) hypertension: Secondary | ICD-10-CM | POA: Diagnosis not present

## 2019-03-31 DIAGNOSIS — E1122 Type 2 diabetes mellitus with diabetic chronic kidney disease: Secondary | ICD-10-CM | POA: Diagnosis not present

## 2019-03-31 DIAGNOSIS — D509 Iron deficiency anemia, unspecified: Secondary | ICD-10-CM | POA: Diagnosis not present

## 2019-04-03 ENCOUNTER — Other Ambulatory Visit: Payer: Self-pay | Admitting: Pharmacy Technician

## 2019-04-03 NOTE — Patient Outreach (Signed)
[place] [facility]) Care Management  04/03/2019  [name] 10-28-1946 [id number]   Care coordination call placed to Dr Mathis Fare office in regards to Merck application for Proventil.  Spoke to [place] and informed her I was calling to inquire about the status and receipt of a patient assistance application that was remailed to the provider on 03/23/2019. [name] informed she would send the back to the nurse's station for a call back.  Will followup with office in 5-7 business days if call is not returned.  Kasey Hansell P. Atarah Cadogan, [place] Management [phone]

## 2019-04-13 ENCOUNTER — Other Ambulatory Visit: Payer: Self-pay | Admitting: Pharmacy Technician

## 2019-04-13 NOTE — Patient Outreach (Signed)
[place] [facility]) Care Management  04/13/2019  [name] June 26, 1946 [id number]  Care coordination call placed to [facility] at Dr. Mathis Fare office.  [name] took the message inquiring about the Merck patient assistance application for Proventil and said she would send the message back to the nurse's station.  Once provider's part is received back then we can mail to Merck as we already have the patient's part back.  [name] P. Senora Lacson, [place] Management [phone]

## 2019-04-27 ENCOUNTER — Ambulatory Visit: Payer: Self-pay | Admitting: Pharmacist

## 2019-05-11 ENCOUNTER — Other Ambulatory Visit: Payer: Self-pay | Admitting: Pharmacy Technician

## 2019-05-11 NOTE — Patient Outreach (Signed)
[place] [facility]) Care Management  05/11/2019  [name] 1946-08-25 [id number]   ADDENDUM  Incoming call received from [name] at Dr. Mathis Fare office [place] patient's application for Proventil.  Spoke to [place] who informed she can not find the applications that have been mailed to the office twice already. She informed the only thing she knew to do would be to send it again.  Will mail 3rd application to provider's office. Once received back then the application can be submitted to [employer].  [name] P. Claudy Abdallah, [place] Management [phone]

## 2019-05-11 NOTE — Patient Outreach (Signed)
[place] [facility]) Care Management  05/11/2019  [name] 04/12/1946 [id number]    Successful outreach call placed to patient in regards to Merck application for Proventil.  Spoke to patient, HIPAA identifiers verified.  Informed patient that I had not submitted her application because the provider's nurse will not return my calls regarding the provider's portion of the application. Patient informed she would reach out to the provider to see what the hold up may be.  Once provider's portion has been received then the application can be submitted to [employer].  [name] P. Isabela Nardelli, [place] Management [phone]

## 2019-05-11 NOTE — Patient Outreach (Signed)
[facility]) Care Management  05/11/2019  [name] [birth year] [id number]    Care coordination call placed to Dr. Mathis Fare office in regards to [name] application for Proventil.  Spoke to [place] who took a message and informed she would pass it along to Dr. Mathis Fare nurse [name].  Once provider's portion is received then the Merck application can be mailed.  Jurline Folger P. [name], [place] Management [phone]

## 2019-05-16 ENCOUNTER — Other Ambulatory Visit: Payer: Self-pay | Admitting: Pharmacy Technician

## 2019-05-16 NOTE — Patient Outreach (Signed)
[place] [facility]) Care Management  05/16/2019  SIOMARA BURKEL 11-21-1946 [id number]    Received all necessary documents and signatures for Merck patient assistance for Proventil HFA.  Submitted completed application via mail to [employer].  Will followup with [employer] in 7-14 business days to inquire on status of application and inquire if they mailed out attestation form.  Marigene Erler P. Egypt Marchiano, [place] Management [phone]

## 2019-05-19 ENCOUNTER — Other Ambulatory Visit: Payer: Self-pay

## 2019-05-19 ENCOUNTER — Ambulatory Visit: Payer: HMO | Admitting: Podiatry

## 2019-05-19 ENCOUNTER — Encounter: Payer: Self-pay | Admitting: Podiatry

## 2019-05-19 VITALS — Temp 97.9°F

## 2019-05-19 DIAGNOSIS — M79675 Pain in left toe(s): Secondary | ICD-10-CM | POA: Diagnosis not present

## 2019-05-19 DIAGNOSIS — L84 Corns and callosities: Secondary | ICD-10-CM

## 2019-05-19 DIAGNOSIS — M79674 Pain in right toe(s): Secondary | ICD-10-CM | POA: Diagnosis not present

## 2019-05-19 DIAGNOSIS — E119 Type 2 diabetes mellitus without complications: Secondary | ICD-10-CM

## 2019-05-19 DIAGNOSIS — B351 Tinea unguium: Secondary | ICD-10-CM

## 2019-05-19 NOTE — Patient Instructions (Signed)

## 2019-05-25 DIAGNOSIS — Z96642 Presence of left artificial hip joint: Secondary | ICD-10-CM | POA: Diagnosis not present

## 2019-05-25 DIAGNOSIS — Z96641 Presence of right artificial hip joint: Secondary | ICD-10-CM | POA: Diagnosis not present

## 2019-05-25 DIAGNOSIS — M25552 Pain in left hip: Secondary | ICD-10-CM | POA: Diagnosis not present

## 2019-05-25 DIAGNOSIS — M25551 Pain in right hip: Secondary | ICD-10-CM | POA: Diagnosis not present

## 2019-05-27 NOTE — Progress Notes (Signed)
Subjective: [name] presents for preventative diabetic foot care on today with painful, thick toenails 1-5 b/l that she cannot cut and which interfere with daily activities.  Pain is aggravated when wearing enclosed shoe gear.  Merrilee Seashore, MD is her PCP. Last visit was 12/16/2018.  She states her hip feels good since she had her surgery.   Current Outpatient Medications:  .  acetaminophen (TYLENOL) 500 MG tablet, Take 1,000 mg by mouth daily as needed for moderate pain or headache., Disp: , Rfl:  .  albuterol (PROVENTIL HFA;VENTOLIN HFA) 108 (90 Base) MCG/ACT inhaler, Inhale 2 puffs into the lungs every 6 (six) hours as needed for wheezing or shortness of breath., Disp: , Rfl:  .  aspirin EC 81 MG tablet, Take 1 tablet (81 mg total) by mouth 2 (two) times daily. (Patient taking differently: Take 81 mg by mouth every other day. ), Disp: 30 tablet, Rfl: 2 .  bumetanide (BUMEX) 1 MG tablet, Take 1 mg by mouth 2 (two) times daily. , Disp: , Rfl:  .  cetirizine (ZYRTEC) 10 MG tablet, Take 10 mg by mouth daily as needed for allergies (during Spring/Summer)., Disp: , Rfl:  .  cholecalciferol (VITAMIN D) 1000 units tablet, Take 1,000 Units by mouth daily., Disp: , Rfl:  .  diclofenac (VOLTAREN) 75 MG EC tablet, Take 1 tablet by mouth 2 (two) times daily., Disp: , Rfl:  .  diltiazem (CARDIZEM CD) 120 MG 24 hr capsule, Take 120 mg by mouth daily before breakfast. , Disp: , Rfl:  .  docusate sodium (COLACE) 100 MG capsule, Take 100 mg by mouth every other day., Disp: , Rfl:  .  enalapril (VASOTEC) 20 MG tablet, Take 20 mg by mouth at bedtime. , Disp: , Rfl:  .  Fe Fum-FePoly-Vit C-Vit B3 (INTEGRA) 62.5-62.5-40-3 MG CAPS, Take 1 capsule by mouth daily., Disp: , Rfl:  .  folic acid (FOLVITE) 338 MCG tablet, Take 800 mcg by mouth daily., Disp: , Rfl:  .  gabapentin (NEURONTIN) 300 MG capsule, Take 300 mg by mouth daily. , Disp: , Rfl:  .  lovastatin (MEVACOR) 40 MG tablet, Take 40 mg by mouth  at bedtime. , Disp: , Rfl:  .  metFORMIN (GLUCOPHAGE-XR) 500 MG 24 hr tablet, Take 500-1,000 mg by mouth See admin instructions. Take 1 tablet (500 mg) in the morning and take 2 tablets (1000 mg) with supper, Disp: , Rfl:  .  methotrexate (RHEUMATREX) 2.5 MG tablet, Take 12.5 mg by mouth every Sunday at [place]. Protect from light. , Disp: , Rfl:  .  Multiple Vitamin (MULTIVITAMIN WITH MINERALS) TABS tablet, Take 1 tablet by mouth daily., Disp: , Rfl:  .  ranitidine (ZANTAC) 300 MG tablet, Take 300 mg by mouth 2 (two) times daily. , Disp: , Rfl:  .  sodium chloride (OCEAN) 0.65 % SOLN nasal spray, Place 1-2 sprays into the nose 4 (four) times daily as needed for congestion., Disp: , Rfl:   Allergies  Allergen Reactions  . Ivp Dye [Iodinated Diagnostic Agents] Nausea And Vomiting  . Prednisone Other (See Comments)    reflux    Objective: Vitals:   05/19/19 1109  Temp: 97.9 F (36.6 C)    Vascular Examination: Capillary refill time <3 seconds x 10 digits.  Dorsalis pedis and Posterior tibial pulses palpable b/l.  Digital hair sparse x 10 digits.  Skin temperature gradient WNL b/l.  Dermatological Examination: Skin with normal turgor, texture and tone b/l.  Toenails 1-5 b/l  discolored, thick, dystrophic with subungual debris and pain with palpation to nailbeds due to thickness of nails.  Hyperkeratotic lesion dorsal 5th PIPJ left foot. No erythema, no edema, no drainage, no flocculence noted.  Musculoskeletal: Muscle strength 5/5 to all LE muscle groups  Hammertoe 5th digit left foot.  No pain, crepitus or joint limitation noted with ROM.   Neurological: Sensation intact with 10 gram monofilament.  Vibratory sensation intact.  Hemoglobin A1C Latest Ref Rng & Units 07/07/2018  HGBA1C 4.8 - 5.6 % 5.3  Some recent data might be hidden   Assessment: Painful onychomycosis toenails 1-5 b/l  Corn left 5th digit NIDDM Encounter for diabetic foot  exam  Plan: 1. Diabetic foot examination performed today. 2. Toenails 1-5 b/l were debrided in length and girth without iatrogenic bleeding. Corn(s) pared left 5th digit utilizing sterile scalpel blade without incident. 3. Patient to continue soft, supportive shoe gear daily. 4. Patient to report any pedal injuries to medical professional immediately. 5. Follow up 3 months.  6. Patient/POA to call should there be a concern in the interim.

## 2019-05-30 ENCOUNTER — Other Ambulatory Visit: Payer: Self-pay | Admitting: Pharmacy Technician

## 2019-05-30 NOTE — Patient Outreach (Signed)
[place] [facility]) Care Management  05/30/2019  [name] January 21, 1946 [id number]    ADDENDUM  Successful outreach call placed to patient in regards to Merck application for Proventil.  Spoke to patient, HIPAA identifiers verified.  Informed patient to be expecting a letter from [employer] patient assistance and to call me when she has received it so that we can discuss it together. Patient verbalized understanding.  Will followup with patient in 10-14 business days if call is not returned.  Ouida Abeyta P. Maybelline Kolarik, [place] Management [phone]

## 2019-05-30 NOTE — Patient Outreach (Signed)
[place] [facility]) Care Management  05/30/2019  [name] September 27, 1946 [id number]    Care coordination call placed to [employer] in regards to patient's Proventil application.  Spoke to [name] who informed the patient's application was received on 05/24/2019 and the attestation from was mailed to patient on 05/26/2019. She informed it typically takes 7-10 business days for patient to receive letter. Once letter is received back then the process can continue.  Will outreach patient with this information.  [name] P. [name], [place] Management [phone]

## 2019-06-05 ENCOUNTER — Other Ambulatory Visit: Payer: Self-pay | Admitting: Pharmacy Technician

## 2019-06-05 NOTE — Patient Outreach (Signed)
[place] [facility]) Care Management  06/05/2019  [name] [birth year] [id number]    Successful outgoing call placed to patient in regards to Merck application for Proventil.  Spoke to patient, HIPAA identifiers verified.  Patient informed she had received the attestation form from [place]. Discussed attestation form with patient. Patient verbalized understanding and informed she would place completed form and all other documents and send the information back to [employer].  Will followup with [employer] in 7-14 business days to inquire on status of application.  Nyko Gell P. Zamarah Ullmer, [place] Management [phone]

## 2019-06-07 DIAGNOSIS — E119 Type 2 diabetes mellitus without complications: Secondary | ICD-10-CM | POA: Diagnosis not present

## 2019-06-07 DIAGNOSIS — M79643 Pain in unspecified hand: Secondary | ICD-10-CM | POA: Diagnosis not present

## 2019-06-07 DIAGNOSIS — I10 Essential (primary) hypertension: Secondary | ICD-10-CM | POA: Diagnosis not present

## 2019-06-07 DIAGNOSIS — M0609 Rheumatoid arthritis without rheumatoid factor, multiple sites: Secondary | ICD-10-CM | POA: Diagnosis not present

## 2019-06-07 DIAGNOSIS — Z8739 Personal history of other diseases of the musculoskeletal system and connective tissue: Secondary | ICD-10-CM | POA: Diagnosis not present

## 2019-06-07 DIAGNOSIS — M1990 Unspecified osteoarthritis, unspecified site: Secondary | ICD-10-CM | POA: Diagnosis not present

## 2019-06-07 DIAGNOSIS — M5416 Radiculopathy, lumbar region: Secondary | ICD-10-CM | POA: Diagnosis not present

## 2019-06-07 DIAGNOSIS — Z79899 Other long term (current) drug therapy: Secondary | ICD-10-CM | POA: Diagnosis not present

## 2019-06-07 DIAGNOSIS — D649 Anemia, unspecified: Secondary | ICD-10-CM | POA: Diagnosis not present

## 2019-06-12 ENCOUNTER — Other Ambulatory Visit: Payer: Self-pay | Admitting: Pharmacy Technician

## 2019-06-12 NOTE — Patient Outreach (Signed)
[place] [facility]) Care Management  06/12/2019  [name] [birth year] [id number]   Care coordination call placed to [employer] in regards to patient's application for Proventil.  Spoke to [place] who informed the attestation form has been received back and patient has been APPROVED 05/25/2019-11/23/2019. She informed medication request was made to pharmacy on 05/25/2019 and that the medication should ship sometime this week.  Will followup with patient in 14-21 business days to inquire if medication was received.  [place]aceland Wachter P. Lanard Arguijo, [place] Management [phone]

## 2019-06-26 ENCOUNTER — Ambulatory Visit: Payer: Self-pay | Admitting: Pharmacist

## 2019-06-28 ENCOUNTER — Other Ambulatory Visit: Payer: Self-pay | Admitting: Pharmacy Technician

## 2019-06-28 NOTE — Patient Outreach (Signed)
[facility]) Care Management  06/28/2019  [name] 1946-09-21 [id number]   Successful outreach call placed to patient in regards to [employer] application for Proventil HFA.  Spoke to patient, HIPAA identifiers verified.  Patient informed she had received 3 inhalers. Discussed refill procedure with patient and she verbalized understanding. Patient denied have any other questions or concerns as it relates to patient assistance.  Confirmed patient had name and number for future patient assistance related needs.  Will route note to [place] that patient assistance has been completed and will remove myself from the care team.  Luiz Ochoa. Manjinder Breau, [place] Management [phone]

## 2019-07-24 ENCOUNTER — Other Ambulatory Visit: Payer: Self-pay | Admitting: Pharmacist

## 2019-07-24 NOTE — Patient Outreach (Signed)
[facility]) Care Management  07/24/2019  [name] 11-Dec-1945 [id number]  Patient was called for CSNP follow up. HIPAA identifiers were obtained. Patient confirmed receipt of three Proventil HFA inhalers from [employer].   The refill process from [place] was reviewed.    Medications were reviewed.  Medications Reviewed Today    Reviewed by Elayne Guerin, [facility] (Pharmacist) on 07/24/19 at 1034  Med List Status: <None>  Medication Order Taking? Sig Documenting Provider Last Dose Status Informant  acetaminophen (TYLENOL) 500 MG tablet [id number] Yes Take 1,000 mg by mouth daily as needed for moderate pain or headache. [provider] Taking Active Self  albuterol (PROVENTIL HFA;VENTOLIN HFA) 108 (90 Base) MCG/ACT inhaler [id number] Yes Inhale 2 puffs into the lungs every 6 (six) hours as needed for wheezing or shortness of breath. [provider] Taking Active Self  Blood Glucose Monitoring Suppl (ONE TOUCH ULTRA 2) w/Device KIT [id number] Yes Check blood sugar twice per week [provider] Taking Active   bumetanide (BUMEX) 1 MG tablet [id number] Yes Take 1 mg by mouth 2 (two) times daily.  [provider] Taking Active Self  cetirizine (ZYRTEC) 10 MG tablet [id number] Yes Take 10 mg by mouth daily as needed for allergies (during Spring/Summer). [provider] Taking Active Self  cholecalciferol (VITAMIN D) 1000 units tablet [id number] Yes Take 1,000 Units by mouth daily. [provider] Taking Active Self  diclofenac (VOLTAREN) 75 MG EC tablet [id number] Yes Take 1 tablet by mouth 2 (two) times daily. [provider] Taking Active   diltiazem (CARDIZEM CD) 120 MG 24 hr capsule [id number] Yes Take 120 mg by mouth daily before breakfast.  [provider] Taking Active Self  docusate sodium (COLACE) 100 MG capsule [id number] Yes Take 100 mg by mouth every other day. [provider] Taking Active Self   enalapril (VASOTEC) 20 MG tablet [id number] Yes Take 20 mg by mouth at bedtime.  [provider] Taking Active Self           Med Note Tami Lin Jan 24, 2018  9:14 AM)    Fe Fum-FePoly-Vit C-Vit B3 (INTEGRA) 62.5-62.5-40-3 MG CAPS [id number] Yes Take 1 capsule by mouth daily. [provider] Taking Active   folic acid (FOLVITE) 588 MCG tablet [id number] Yes Take 800 mcg by mouth daily. [provider] Taking Active Self  gabapentin (NEURONTIN) 300 MG capsule [id number] Yes Take 300 mg by mouth daily.  [provider] Taking Active Self           Med Note Tami Lin Jan 24, 2018  9:14 AM)    Lancets (ONETOUCH DELICA PLUS [id number]) MISC [id number] Yes Use to check blood sugar [provider] Taking Active   lovastatin (MEVACOR) 40 MG tablet [id number] Yes Take 40 mg by mouth at bedtime.  [provider] Taking Active Self           Med Note Tami Lin Jan 24, 2018  9:14 AM)    metFORMIN (GLUCOPHAGE-XR) 500 MG 24 hr tablet [id number] Yes Take 500-1,000 mg by mouth See admin instructions. Take 1 tablet (500 mg) in the morning and take 2 tablets (1000 mg) with supper [provider] Taking Active Self  methotrexate (RHEUMATREX) 2.5 MG tablet [id number] Yes Take 12.5 mg by mouth every Sunday at [place]. Protect from light.  [provider] Taking Active Self  Med Note Tami Lin Jan 24, 2018  9:15 AM)    Multiple Vitamin (MULTIVITAMIN WITH MINERALS) TABS tablet [id number] Yes Take 1 tablet by mouth daily. [provider] Taking Active Self  ranitidine (ZANTAC) 300 MG tablet [id number] Yes Take 300 mg by mouth 2 (two) times daily.  [provider] Taking Active Self           Med Note Kenton Kingfisher, Lyndel Pleasure Jan 24, 2018  9:15 AM)    sodium chloride (OCEAN) 0.65 % SOLN nasal spray [id number] Yes Place 1-2 sprays into the nose 4 (four) times daily  as needed for congestion. [provider] Taking Active Self           Med Note Corinna Lines Feb 13, 2019  3:58 PM) PRN ONLY          HgA1c-5.3% (2019) On Lovastatin- last filled 4/14 and 6/26 for 90 day supplies.  Patient reported that she checks her blood sugar twice a week.  Her last reading was 106 mg/dl (fasting am).  Plan: Call patient back in 3 months for quarterly review.  Elayne Guerin, PharmD, [place] Clinical Pharmacist [phone]

## 2019-07-25 DIAGNOSIS — H04123 Dry eye syndrome of bilateral lacrimal glands: Secondary | ICD-10-CM | POA: Diagnosis not present

## 2019-07-25 DIAGNOSIS — H40013 Open angle with borderline findings, low risk, bilateral: Secondary | ICD-10-CM | POA: Diagnosis not present

## 2019-07-25 DIAGNOSIS — Z961 Presence of intraocular lens: Secondary | ICD-10-CM | POA: Diagnosis not present

## 2019-07-25 DIAGNOSIS — E119 Type 2 diabetes mellitus without complications: Secondary | ICD-10-CM | POA: Diagnosis not present

## 2019-08-15 ENCOUNTER — Other Ambulatory Visit: Payer: Self-pay

## 2019-08-15 NOTE — Patient Outreach (Signed)
  [facility]) Care Management Chronic Special Needs Program  08/15/2019  Name: [name] DOB: 1946-04-11  MRN: [id number]  [name] is enrolled in a chronic special needs plan for Diabetes. Reviewed and updated care plan.  Subjective: Client states she has been trying hard to stay safe this summer and she has been wearing her mask.  States she is babysitting her great grand kids now and she has not been able to get out to walk like she had planned.  States she checks her blood sugars about 3 times a week and they have been ranging 90-120.  States she has been trying to eat more vegetables.  States she had her eye exam a few weeks ago and she will see her primary care doctor in a few weeks.    Goals Addressed            This Visit's Progress   . Client understands the importance of follow-up with providers by attending scheduled visits   On track   . Client will report no worsening of symptoms related to heart disease within the next 6 months(continue 08/15/19)   On track   . Client will use Assistive Devices as needed and verbalize understanding of device use   On track   . Client will verbalize knowledge of chronic lung disease as evidenced by no ED visits or Inpatient stays related to chronic lung disease    On track   . Client will verbalize knowledge of self management of Hypertension as evidences by BP reading of 140/90 or less; or as defined by provider   On track   . Diabetes Patient stated goal to walk 30 minutes 3 times a week for the next 6 months( continue 08/15/19) (pt-stated)   Not on track    Try to walk for 15 minutes twice a day    . Maintain timely refills of diabetic medication as prescribed within the year .   On track   . COMPLETED: Obtain annual  Lipid Profile, LDL-C       Completed 03/24/2019    . COMPLETED: Obtain Annual Eye (retinal)  Exam        Completed in September    . COMPLETED: Obtain Annual Foot Exam       Completed 05/19/2019    . COMPLETED: Obtain annual screen for micro albuminuria (urine) , nephropathy (kidney problems)       Completed 12/16/2018    . Obtain Hemoglobin A1C at least 2 times per year   On track    Checked 03/31/2019    . COMPLETED: Visit Primary Care Provider or Endocrinologist at least 2 times per year        Completed 12/23/18, 03/31/19     Client  not meeting diabetes self management goal of hemoglobin A1C of <7% with last reading of 5.6% Client getting assistance from pharmacy assistance for her inhalers. Reinforced to follow a low CHO diet and to watch her portion sizes Encouraged to try walking twice a day for 15 minutes if she is unable to walk for 30 minutes Reviewed number for 24-hour nurse Line Reviewed COVID 19 precautions Plan:  Send successful outreach letter with a copy of their individualized care plan and Send individual care plan to provider  Chronic care management coordinator will outreach in:  4-6 months     [place], Jackquline Denmark, [place] Management Coordinator [place] Management [phone]

## 2019-08-16 DIAGNOSIS — J0100 Acute maxillary sinusitis, unspecified: Secondary | ICD-10-CM | POA: Diagnosis not present

## 2019-08-16 DIAGNOSIS — E1122 Type 2 diabetes mellitus with diabetic chronic kidney disease: Secondary | ICD-10-CM | POA: Diagnosis not present

## 2019-08-21 ENCOUNTER — Encounter: Payer: Self-pay | Admitting: Podiatry

## 2019-08-21 ENCOUNTER — Other Ambulatory Visit: Payer: Self-pay

## 2019-08-21 ENCOUNTER — Ambulatory Visit: Payer: HMO | Admitting: Podiatry

## 2019-08-21 DIAGNOSIS — L84 Corns and callosities: Secondary | ICD-10-CM

## 2019-08-21 DIAGNOSIS — M79674 Pain in right toe(s): Secondary | ICD-10-CM | POA: Diagnosis not present

## 2019-08-21 DIAGNOSIS — E119 Type 2 diabetes mellitus without complications: Secondary | ICD-10-CM | POA: Diagnosis not present

## 2019-08-21 DIAGNOSIS — B351 Tinea unguium: Secondary | ICD-10-CM | POA: Diagnosis not present

## 2019-08-21 DIAGNOSIS — M79675 Pain in left toe(s): Secondary | ICD-10-CM | POA: Diagnosis not present

## 2019-08-21 NOTE — Patient Instructions (Signed)
Diabetes Mellitus and Foot Care Foot care is an important part of your health, especially when you have diabetes. Diabetes may cause you to have problems because of poor blood flow (circulation) to your feet and legs, which can cause your skin to:  Become thinner and drier.  Break more easily.  Heal more slowly.  Peel and crack. You may also have nerve damage (neuropathy) in your legs and feet, causing decreased feeling in them. This means that you may not notice minor injuries to your feet that could lead to more serious problems. Noticing and addressing any potential problems early is the best way to prevent future foot problems. How to care for your feet Foot hygiene  Wash your feet daily with warm water and mild soap. Do not use hot water. Then, pat your feet and the areas between your toes until they are completely dry. Do not soak your feet as this can dry your skin.  Trim your toenails straight across. Do not dig under them or around the cuticle. File the edges of your nails with an emery board or nail file.  Apply a moisturizing lotion or petroleum jelly to the skin on your feet and to dry, brittle toenails. Use lotion that does not contain alcohol and is unscented. Do not apply lotion between your toes. Shoes and socks  Wear clean socks or stockings every day. Make sure they are not too tight. Do not wear knee-high stockings since they may decrease blood flow to your legs.  Wear shoes that fit properly and have enough cushioning. Always look in your shoes before you put them on to be sure there are no objects inside.  To break in new shoes, wear them for just a few hours a day. This prevents injuries on your feet. Wounds, scrapes, corns, and calluses  Check your feet daily for blisters, cuts, bruises, sores, and redness. If you cannot see the bottom of your feet, use a mirror or ask someone for help.  Do not cut corns or calluses or try to remove them with medicine.  If you  find a minor scrape, cut, or break in the skin on your feet, keep it and the skin around it clean and dry. You may clean these areas with mild soap and water. Do not clean the area with peroxide, alcohol, or iodine.  If you have a wound, scrape, corn, or callus on your foot, look at it several times a day to make sure it is healing and not infected. Check for: ? Redness, swelling, or pain. ? Fluid or blood. ? Warmth. ? Pus or a bad smell. General instructions  Do not cross your legs. This may decrease blood flow to your feet.  Do not use heating pads or hot water bottles on your feet. They may burn your skin. If you have lost feeling in your feet or legs, you may not know this is happening until it is too late.  Protect your feet from hot and cold by wearing shoes, such as at the beach or on hot pavement.  Schedule a complete foot exam at least once a year (annually) or more often if you have foot problems. If you have foot problems, report any cuts, sores, or bruises to your health care provider immediately. Contact a health care provider if:  You have a medical condition that increases your risk of infection and you have any cuts, sores, or bruises on your feet.  You have an injury that is not   healing.  You have redness on your legs or feet.  You feel burning or tingling in your legs or feet.  You have pain or cramps in your legs and feet.  Your legs or feet are numb.  Your feet always feel cold.  You have pain around a toenail. Get help right away if:  You have a wound, scrape, corn, or callus on your foot and: ? You have pain, swelling, or redness that gets worse. ? You have fluid or blood coming from the wound, scrape, corn, or callus. ? Your wound, scrape, corn, or callus feels warm to the touch. ? You have pus or a bad smell coming from the wound, scrape, corn, or callus. ? You have a fever. ? You have a red line going up your leg. Summary  Check your feet every day  for cuts, sores, red spots, swelling, and blisters.  Moisturize feet and legs daily.  Wear shoes that fit properly and have enough cushioning.  If you have foot problems, report any cuts, sores, or bruises to your health care provider immediately.  Schedule a complete foot exam at least once a year (annually) or more often if you have foot problems. This information is not intended to replace advice given to you by your health care provider. Make sure you discuss any questions you have with your health care provider. Document Released: 11/06/2000 Document Revised: 12/22/2017 Document Reviewed: 12/11/2016 Elsevier Patient Education  2020 Elsevier Inc.   Onychomycosis/Fungal Toenails  WHAT IS IT? An infection that lies within the keratin of your nail plate that is caused by a fungus.  WHY ME? Fungal infections affect all ages, sexes, races, and creeds.  There may be many factors that predispose you to a fungal infection such as age, coexisting medical conditions such as diabetes, or an autoimmune disease; stress, medications, fatigue, genetics, etc.  Bottom line: fungus thrives in a warm, moist environment and your shoes offer such a location.  IS IT CONTAGIOUS? Theoretically, yes.  You do not want to share shoes, nail clippers or files with someone who has fungal toenails.  Walking around barefoot in the same room or sleeping in the same bed is unlikely to transfer the organism.  It is important to realize, however, that fungus can spread easily from one nail to the next on the same foot.  HOW DO WE TREAT THIS?  There are several ways to treat this condition.  Treatment may depend on many factors such as age, medications, pregnancy, liver and kidney conditions, etc.  It is best to ask your doctor which options are available to you.  1. No treatment.   Unlike many other medical concerns, you can live with this condition.  However for many people this can be a painful condition and may lead to  ingrown toenails or a bacterial infection.  It is recommended that you keep the nails cut short to help reduce the amount of fungal nail. 2. Topical treatment.  These range from herbal remedies to prescription strength nail lacquers.  About 40-50% effective, topicals require twice daily application for approximately 9 to 12 months or until an entirely new nail has grown out.  The most effective topicals are medical grade medications available through physicians offices. 3. Oral antifungal medications.  With an 80-90% cure rate, the most common oral medication requires 3 to 4 months of therapy and stays in your system for a year as the new nail grows out.  Oral antifungal medications do require   blood work to make sure it is a safe drug for you.  A liver function panel will be performed prior to starting the medication and after the first month of treatment.  It is important to have the blood work performed to avoid any harmful side effects.  In general, this medication safe but blood work is required. 4. Laser Therapy.  This treatment is performed by applying a specialized laser to the affected nail plate.  This therapy is noninvasive, fast, and non-painful.  It is not covered by insurance and is therefore, out of pocket.  The results have been very good with a 80-95% cure rate.  The [facility] is the only practice in the area to offer this therapy. 5. Permanent Nail Avulsion.  Removing the entire nail so that a new nail will not grow back. 

## 2019-08-23 NOTE — Progress Notes (Signed)
Subjective: [name] is a 73 y.o. y.o. female who presents today with cc of painful, discolored, thick toenails and corn left 5th digit which interfere with daily activities. Pain is aggravated when wearing enclosed shoe gear and relieved with periodic professional debridement.  Merrilee Seashore, MD is her PCP.   Current Outpatient Medications on File Prior to Visit  Medication Sig Dispense Refill  . acetaminophen (TYLENOL) 500 MG tablet Take 1,000 mg by mouth daily as needed for moderate pain or headache.    . albuterol (PROVENTIL HFA;VENTOLIN HFA) 108 (90 Base) MCG/ACT inhaler Inhale 2 puffs into the lungs every 6 (six) hours as needed for wheezing or shortness of breath.    Marland Kitchen azithromycin (ZITHROMAX) 250 MG tablet TK 2 TS PO FOR 1 DAY THEN TK 1 T PO D FOR 4 DAYS    . Blood Glucose Monitoring Suppl (ONE TOUCH ULTRA 2) w/Device KIT Check blood sugar twice per week    . bumetanide (BUMEX) 1 MG tablet Take 1 mg by mouth 2 (two) times daily.     . cetirizine (ZYRTEC) 10 MG tablet Take 10 mg by mouth daily as needed for allergies (during Spring/Summer).    . cholecalciferol (VITAMIN D) 1000 units tablet Take 1,000 Units by mouth daily.    . diclofenac (VOLTAREN) 75 MG EC tablet Take 1 tablet by mouth 2 (two) times daily.    Marland Kitchen diltiazem (CARDIZEM CD) 120 MG 24 hr capsule Take 120 mg by mouth daily before breakfast.     . docusate sodium (COLACE) 100 MG capsule Take 100 mg by mouth every other day.    . enalapril (VASOTEC) 20 MG tablet Take 20 mg by mouth at bedtime.     . Fe Fum-FePoly-Vit C-Vit B3 (INTEGRA) 62.5-62.5-40-3 MG CAPS Take 1 capsule by mouth daily.    . folic acid (FOLVITE) 858 MCG tablet Take 800 mcg by mouth daily.    Marland Kitchen gabapentin (NEURONTIN) 300 MG capsule Take 300 mg by mouth daily.     . Lancets (ONETOUCH DELICA PLUS [id number]) MISC Use to check blood sugar    . lovastatin (MEVACOR) 40 MG tablet Take 40 mg by mouth at bedtime.     . metFORMIN (GLUCOPHAGE-XR) 500 MG 24 hr  tablet Take 500-1,000 mg by mouth See admin instructions. Take 1 tablet (500 mg) in the morning and take 2 tablets (1000 mg) with supper    . methotrexate (RHEUMATREX) 2.5 MG tablet Take 12.5 mg by mouth every Sunday at [place]. Protect from light.     . Multiple Vitamin (MULTIVITAMIN WITH MINERALS) TABS tablet Take 1 tablet by mouth daily.    . ranitidine (ZANTAC) 300 MG tablet Take 300 mg by mouth 2 (two) times daily.     . sodium chloride (OCEAN) 0.65 % SOLN nasal spray Place 1-2 sprays into the nose 4 (four) times daily as needed for congestion.     No current facility-administered medications on file prior to visit.     Allergies  Allergen Reactions  . Ivp Dye [Iodinated Diagnostic Agents] Nausea And Vomiting  . Prednisone Other (See Comments)    reflux    Objective: Vascular Examination: Capillary refill time <3 seconds x 10 digits.  Dorsalis pedis pulses palpable b/l.  Posterior tibial pulses palpable b/l.  Digital hair sparse x 10 digits.  Skin temperature gradient WNL b/l.  Dermatological Examination: Skin with normal turgor, texture and tone b/l.  Toenails 1-5 b/l discolored, thick, dystrophic with subungual debris and pain with  palpation to nailbeds due to thickness of nails.  Hyperkeratotic lesion left 5th digit. No erythema, no edema, no drainage, no flocculence noted.   Musculoskeletal: Muscle strength 5/5 to all LE muscle groups  Neurological: Sensation intact 5/5 b/l with 10 gram monofilament.  Vibratory sensation intact b/l.  Assessment: 1. Painful onychomycosis toenails 1-5 b/l 2.  Corn left 5th digit 3.  NIDDM  Plan: 1. Continue diabetic foot care principles. Literature dispensed on today. 2. Toenails 1-5 b/l were debrided in length and girth without iatrogenic bleeding. 3. Hyperkeratotic lesion(s) pared left 5th digit with sterile scalpel blade without incident. 4. Patient to continue soft, supportive shoe gear  daily. 5. Patient to report any pedal injuries to medical professional immediately. 6. Follow up 3 months.  7. Patient/POA to call should there be a concern in the interim.

## 2019-08-25 DIAGNOSIS — D509 Iron deficiency anemia, unspecified: Secondary | ICD-10-CM | POA: Diagnosis not present

## 2019-08-25 DIAGNOSIS — Z7189 Other specified counseling: Secondary | ICD-10-CM | POA: Diagnosis not present

## 2019-08-25 DIAGNOSIS — I700 Atherosclerosis of aorta: Secondary | ICD-10-CM | POA: Diagnosis not present

## 2019-08-25 DIAGNOSIS — E782 Mixed hyperlipidemia: Secondary | ICD-10-CM | POA: Diagnosis not present

## 2019-08-25 DIAGNOSIS — E1122 Type 2 diabetes mellitus with diabetic chronic kidney disease: Secondary | ICD-10-CM | POA: Diagnosis not present

## 2019-08-25 DIAGNOSIS — I10 Essential (primary) hypertension: Secondary | ICD-10-CM | POA: Diagnosis not present

## 2019-08-25 DIAGNOSIS — Z23 Encounter for immunization: Secondary | ICD-10-CM | POA: Diagnosis not present

## 2019-09-19 DIAGNOSIS — J0100 Acute maxillary sinusitis, unspecified: Secondary | ICD-10-CM | POA: Diagnosis not present

## 2019-09-19 DIAGNOSIS — I10 Essential (primary) hypertension: Secondary | ICD-10-CM | POA: Diagnosis not present

## 2019-09-19 DIAGNOSIS — H8110 Benign paroxysmal vertigo, unspecified ear: Secondary | ICD-10-CM | POA: Diagnosis not present

## 2019-10-10 DIAGNOSIS — Z79899 Other long term (current) drug therapy: Secondary | ICD-10-CM | POA: Diagnosis not present

## 2019-10-10 DIAGNOSIS — Z8739 Personal history of other diseases of the musculoskeletal system and connective tissue: Secondary | ICD-10-CM | POA: Diagnosis not present

## 2019-10-10 DIAGNOSIS — E119 Type 2 diabetes mellitus without complications: Secondary | ICD-10-CM | POA: Diagnosis not present

## 2019-10-10 DIAGNOSIS — M79643 Pain in unspecified hand: Secondary | ICD-10-CM | POA: Diagnosis not present

## 2019-10-10 DIAGNOSIS — I10 Essential (primary) hypertension: Secondary | ICD-10-CM | POA: Diagnosis not present

## 2019-10-10 DIAGNOSIS — M1990 Unspecified osteoarthritis, unspecified site: Secondary | ICD-10-CM | POA: Diagnosis not present

## 2019-10-10 DIAGNOSIS — M25569 Pain in unspecified knee: Secondary | ICD-10-CM | POA: Diagnosis not present

## 2019-10-10 DIAGNOSIS — M5416 Radiculopathy, lumbar region: Secondary | ICD-10-CM | POA: Diagnosis not present

## 2019-10-10 DIAGNOSIS — M0609 Rheumatoid arthritis without rheumatoid factor, multiple sites: Secondary | ICD-10-CM | POA: Diagnosis not present

## 2019-10-10 DIAGNOSIS — D649 Anemia, unspecified: Secondary | ICD-10-CM | POA: Diagnosis not present

## 2019-10-23 ENCOUNTER — Ambulatory Visit: Payer: Self-pay | Admitting: Pharmacist

## 2019-11-13 ENCOUNTER — Other Ambulatory Visit: Payer: Self-pay | Admitting: Pharmacist

## 2019-11-13 NOTE — Patient Outreach (Signed)
[facility]) Care Management  11/13/2019  [name] 12/04/1945 [id number]  Insurance-HTA CSNP   Patient was called for CSNP follow up. HIPAA identifiers were obtained.   Patient is a 73 year old female with multiple medical conditions including but not limited to:  Type 2 diabetes, hypertension, lumbar stenosis, Osteoarthritis of left hip, and hyperlipidemia.  Patient did not report any issues with her medications. She did inquire about Proventil HFA patient assistance for 2021.  Medications were reviewed via telephone:  Medications Reviewed Today    Reviewed by ,  J, RPH (Pharmacist) on 11/13/19 at 1335  Med List Status: <None>  Medication Order Taking? Sig Documenting Provider Last Dose Status Informant  acetaminophen (TYLENOL) 500 MG tablet [id number] Yes Take 1,000 mg by mouth daily as needed for moderate pain or headache. [provider] Taking Active Self  albuterol (PROVENTIL HFA;VENTOLIN HFA) 108 (90 Base) MCG/ACT inhaler [id number] Yes Inhale 2 puffs into the lungs every 6 (six) hours as needed for wheezing or shortness of breath. [provider] Taking Active Self  Blood Glucose Monitoring Suppl (ONE TOUCH ULTRA 2) w/Device KIT [id number] Yes Check blood sugar twice per week [provider] Taking Active   bumetanide (BUMEX) 1 MG tablet [id number] Yes Take 1 mg by mouth 2 (two) times daily.  [provider] Taking Active Self  cetirizine (ZYRTEC) 10 MG tablet [id number] Yes Take 10 mg by mouth daily as needed for allergies (during Spring/Summer). [provider] Taking Active Self  cholecalciferol (VITAMIN D) 1000 units tablet [id number] Yes Take 1,000 Units by mouth daily. [provider] Taking Active Self  diclofenac (VOLTAREN) 75 MG EC tablet [id number] Yes Take 1 tablet by mouth 2 (two) times daily. [provider] Taking Active   diltiazem (CARDIZEM CD) 120 MG 24 hr capsule [id number] Yes  Take 120 mg by mouth daily before breakfast.  [provider] Taking Active Self  docusate sodium (COLACE) 100 MG capsule [id number] Yes Take 100 mg by mouth every other day. [provider] Taking Active Self  enalapril (VASOTEC) 20 MG tablet [id number] Yes Take 20 mg by mouth at bedtime.  [provider] Taking Active Self           Med Note (HARRIS, YOLINDA T   Mon Jan 24, 2018  9:14 AM)    folic acid (FOLVITE) 800 MCG tablet [id number] Yes Take 800 mcg by mouth daily. [provider] Taking Active Self  gabapentin (NEURONTIN) 300 MG capsule [id number] Yes Take 300 mg by mouth daily.  [provider] Taking Active Self           Med Note (HARRIS, YOLINDA T   Mon Jan 24, 2018  9:14 AM)    Lancets (ONETOUCH DELICA PLUS LANCET33G) MISC [id number] Yes Use to check blood sugar [provider] Taking Active   lovastatin (MEVACOR) 40 MG tablet [id number] Yes Take 40 mg by mouth at bedtime.  [provider] Taking Active Self           Med Note (HARRIS, YOLINDA T   Mon Jan 24, 2018  9:14 AM)    meclizine (ANTIVERT) 12.5 MG tablet [id number] Yes Take 1 tablet by mouth 3 (three) times daily as needed for dizziness. [provider] Taking Active   metFORMIN (GLUCOPHAGE-XR) 500 MG 24 hr tablet [id number] Yes Take 500-1,000 mg by mouth See admin instructions. Take 1 tablet (500 mg) in the morning and take 2 tablets (1000 mg) with supper [provider] Taking Active Self  methotrexate (RHEUMATREX) 2.5 MG tablet [id number] Yes Take 12.5 mg by mouth every Sunday at 6pm. Caution:Chemotherapy. Protect from light.  [provider] Taking Active Self           Med Note (HARRIS, YOLINDA T   Mon Jan 24, 2018  9:15 AM)    Multiple Vitamin (MULTIVITAMIN WITH MINERALS) TABS tablet [id number] Yes Take 1 tablet by mouth daily. [provider] Taking Active Self  sodium chloride (OCEAN) 0.65 % SOLN nasal spray [id number] Yes Place 1-2  sprays into the nose 4 (four) times daily as needed for congestion. [provider] Taking Active Self           Med Note (,  J   Mon Feb 13, 2019  3:58 PM) PRN ONLY         Medication Review Findings: HgA1c-5.8% (2019)  Medication Assistance Findings: Over income for Extra Help Will send new [employer] Application for 2021.  Follow up with patient in 10-12 weeks.   J. , PharmD, BCACP [facility] Clinical Pharmacist [phone]   

## 2019-11-13 NOTE — Patient Outreach (Addendum)
[place] [facility]) Care Management  11/13/2019  VERTIE DIBBERN [birth year] [id number]   Patient was called to follow up on medication assistance and for CSNP medication review. HIPAA identifiers were obtained.  Patient is a 73 year old female with multiple medical conditions including but not limited to: type 2 diabetes, hypertension, lumbar stenosis, osteoarthritis of the hip, and hyperlipidemia.  She received Proventil from [facility] Patient Assistance Program in 2020.  Medications were reviewed via telephone:  Medications Reviewed Today    Reviewed by Elayne Guerin, [facility] (Pharmacist) on 11/13/19 at 1031  Med List Status: <None>  Medication Order Taking? Sig Documenting Provider Last Dose Status Informant  acetaminophen (TYLENOL) 500 MG tablet [id number] Yes Take 1,000 mg by mouth daily as needed for moderate pain or headache. [provider] Taking Active Self  albuterol (PROVENTIL HFA;VENTOLIN HFA) 108 (90 Base) MCG/ACT inhaler [id number] Yes Inhale 2 puffs into the lungs every 6 (six) hours as needed for wheezing or shortness of breath. [provider] Taking Active Self  Blood Glucose Monitoring Suppl (ONE TOUCH ULTRA 2) w/Device KIT [id number] Yes Check blood sugar twice per week [provider] Taking Active   bumetanide (BUMEX) 1 MG tablet [id number] Yes Take 1 mg by mouth 2 (two) times daily.  [provider] Taking Active Self  cetirizine (ZYRTEC) 10 MG tablet [id number] Yes Take 10 mg by mouth daily as needed for allergies (during Spring/Summer). [provider] Taking Active Self  cholecalciferol (VITAMIN D) 1000 units tablet [id number] Yes Take 1,000 Units by mouth daily. [provider] Taking Active Self  diclofenac (VOLTAREN) 75 MG EC tablet [id number] Yes Take 1 tablet by mouth 2 (two) times daily. [provider] Taking Active   diltiazem (CARDIZEM CD) 120 MG 24 hr capsule [id number] Yes Take 120 mg by mouth daily  before breakfast.  [provider] Taking Active Self  docusate sodium (COLACE) 100 MG capsule [id number] Yes Take 100 mg by mouth every other day. [provider] Taking Active Self  enalapril (VASOTEC) 20 MG tablet [id number] Yes Take 20 mg by mouth at bedtime.  [provider] Taking Active Self           Med Note Tami Lin Jan 24, 2018  9:14 AM)          Discontinued 11/13/19 1030 (Discontinued by provider)   folic acid (FOLVITE) 170 MCG tablet [id number] Yes Take 800 mcg by mouth daily. [provider] Taking Active Self  gabapentin (NEURONTIN) 300 MG capsule [id number] Yes Take 300 mg by mouth daily.  [provider] Taking Active Self           Med Note Tami Lin Jan 24, 2018  9:14 AM)    Lancets (ONETOUCH DELICA PLUS [id number]) MISC [id number] Yes Use to check blood sugar [provider] Taking Active   lovastatin (MEVACOR) 40 MG tablet [id number] Yes Take 40 mg by mouth at bedtime.  [provider] Taking Active Self           Med Note Tami Lin Jan 24, 2018  9:14 AM)    meclizine (ANTIVERT) 12.5 MG tablet [id number] Yes Take 1 tablet by mouth 3 (three) times daily as needed for dizziness. [provider] Taking Active   metFORMIN (GLUCOPHAGE-XR) 500 MG 24 hr tablet [id number] Yes Take 500-1,000 mg by mouth See admin instructions. Take 1 tablet (500 mg) in the morning and take  2 tablets (1000 mg) with supper [provider] Taking Active Self  methotrexate (RHEUMATREX) 2.5 MG tablet [id number] Yes Take 12.5 mg by mouth every Sunday at [place]. Protect from light.  [provider] Taking Active Self           Med Note Tami Lin Jan 24, 2018  9:15 AM)    Multiple Vitamin (MULTIVITAMIN WITH MINERALS) TABS tablet [id number] Yes Take 1 tablet by mouth daily. [provider] Taking Active Self        Discontinued 11/13/19 1031  (Discontinued by provider)            Med Note Kenton Kingfisher, Lyndel Pleasure Jan 24, 2018  9:15 AM)    sodium chloride (OCEAN) 0.65 % SOLN nasal spray [id number] Yes Place 1-2 sprays into the nose 4 (four) times daily as needed for congestion. [provider] Taking Active Self           Med Note Corinna Lines Feb 13, 2019  3:58 PM) PRN ONLY         Medication review findings:  HgA1c-5.3% 2019 On statin- Lovastatin last filled 05/19/2019  Patient went through her pill bottles and realized she ran out of Lovastatin. She said she would call [facility] about a refill.  Updated patient's pharmacy in her demographics.  Plan:  Follow up with the patient in 4-6 weeks.  Elayne Guerin, PharmD, [place] Clinical Pharmacist [phone]

## 2019-11-22 ENCOUNTER — Other Ambulatory Visit: Payer: Self-pay | Admitting: Pharmacy Technician

## 2019-11-22 NOTE — Patient Outreach (Signed)
[place] [facility]) Care Management  11/22/2019  CALISTA ARMWOOD [birth year] [id number]                                        Medication Assistance Referral  Referral From: [place]  Medication/Company: Proventil HFA / [employer] Patient application portion:  Education officer, museum portion:  Mailed to Dr. Merrilee Seashore Provider address/fax verified via: Office website    Follow up:  Will follow up with patient in 5-10 business days to confirm application(s) have been received.  Elias Dennington P. Adabella Stanis, [place] Management [phone]

## 2019-11-30 DIAGNOSIS — Z1231 Encounter for screening mammogram for malignant neoplasm of breast: Secondary | ICD-10-CM | POA: Diagnosis not present

## 2019-12-01 ENCOUNTER — Other Ambulatory Visit: Payer: Self-pay | Admitting: Pharmacy Technician

## 2019-12-01 NOTE — Patient Outreach (Signed)
[place] [facility]) Care Management  12/01/2019  [name] [birth year] [id number]  Incoming care coordination call received from [place] at Dr. Mathis Fare office.  [name] informed they received the [employer] application for the patient's Proventil but had misplaced the envelope to mail it back to me. Provided [name] the address to mail the application back. [name] informed it would probably be Monday before it is mailed back as they were in need of Dr. Mathis Fare signature.  Will followup in 1 month if application has not been received back from provider's office.  [name], [place] Management [phone]

## 2019-12-04 ENCOUNTER — Other Ambulatory Visit: Payer: Self-pay

## 2019-12-04 ENCOUNTER — Encounter: Payer: Self-pay | Admitting: Podiatry

## 2019-12-04 ENCOUNTER — Ambulatory Visit: Payer: HMO | Admitting: Podiatry

## 2019-12-04 DIAGNOSIS — B351 Tinea unguium: Secondary | ICD-10-CM | POA: Diagnosis not present

## 2019-12-04 DIAGNOSIS — L84 Corns and callosities: Secondary | ICD-10-CM | POA: Diagnosis not present

## 2019-12-04 DIAGNOSIS — E119 Type 2 diabetes mellitus without complications: Secondary | ICD-10-CM

## 2019-12-04 DIAGNOSIS — M79675 Pain in left toe(s): Secondary | ICD-10-CM

## 2019-12-04 DIAGNOSIS — M79674 Pain in right toe(s): Secondary | ICD-10-CM | POA: Diagnosis not present

## 2019-12-04 NOTE — Patient Instructions (Signed)
Diabetes Mellitus and Foot Care Foot care is an important part of your health, especially when you have diabetes. Diabetes may cause you to have problems because of poor blood flow (circulation) to your feet and legs, which can cause your skin to:  Become thinner and drier.  Break more easily.  Heal more slowly.  Peel and crack. You may also have nerve damage (neuropathy) in your legs and feet, causing decreased feeling in them. This means that you may not notice minor injuries to your feet that could lead to more serious problems. Noticing and addressing any potential problems early is the best way to prevent future foot problems. How to care for your feet Foot hygiene  Wash your feet daily with warm water and mild soap. Do not use hot water. Then, pat your feet and the areas between your toes until they are completely dry. Do not soak your feet as this can dry your skin.  Trim your toenails straight across. Do not dig under them or around the cuticle. File the edges of your nails with an emery board or nail file.  Apply a moisturizing lotion or petroleum jelly to the skin on your feet and to dry, brittle toenails. Use lotion that does not contain alcohol and is unscented. Do not apply lotion between your toes. Shoes and socks  Wear clean socks or stockings every day. Make sure they are not too tight. Do not wear knee-high stockings since they may decrease blood flow to your legs.  Wear shoes that fit properly and have enough cushioning. Always look in your shoes before you put them on to be sure there are no objects inside.  To break in new shoes, wear them for just a few hours a day. This prevents injuries on your feet. Wounds, scrapes, corns, and calluses  Check your feet daily for blisters, cuts, bruises, sores, and redness. If you cannot see the bottom of your feet, use a mirror or ask someone for help.  Do not cut corns or calluses or try to remove them with medicine.  If you  find a minor scrape, cut, or break in the skin on your feet, keep it and the skin around it clean and dry. You may clean these areas with mild soap and water. Do not clean the area with peroxide, alcohol, or iodine.  If you have a wound, scrape, corn, or callus on your foot, look at it several times a day to make sure it is healing and not infected. Check for: ? Redness, swelling, or pain. ? Fluid or blood. ? Warmth. ? Pus or a bad smell. General instructions  Do not cross your legs. This may decrease blood flow to your feet.  Do not use heating pads or hot water bottles on your feet. They may burn your skin. If you have lost feeling in your feet or legs, you may not know this is happening until it is too late.  Protect your feet from hot and cold by wearing shoes, such as at the beach or on hot pavement.  Schedule a complete foot exam at least once a year (annually) or more often if you have foot problems. If you have foot problems, report any cuts, sores, or bruises to your health care provider immediately. Contact a health care provider if:  You have a medical condition that increases your risk of infection and you have any cuts, sores, or bruises on your feet.  You have an injury that is not   healing.  You have redness on your legs or feet.  You feel burning or tingling in your legs or feet.  You have pain or cramps in your legs and feet.  Your legs or feet are numb.  Your feet always feel cold.  You have pain around a toenail. Get help right away if:  You have a wound, scrape, corn, or callus on your foot and: ? You have pain, swelling, or redness that gets worse. ? You have fluid or blood coming from the wound, scrape, corn, or callus. ? Your wound, scrape, corn, or callus feels warm to the touch. ? You have pus or a bad smell coming from the wound, scrape, corn, or callus. ? You have a fever. ? You have a red line going up your leg. Summary  Check your feet every day  for cuts, sores, red spots, swelling, and blisters.  Moisturize feet and legs daily.  Wear shoes that fit properly and have enough cushioning.  If you have foot problems, report any cuts, sores, or bruises to your health care provider immediately.  Schedule a complete foot exam at least once a year (annually) or more often if you have foot problems. This information is not intended to replace advice given to you by your health care provider. Make sure you discuss any questions you have with your health care provider. Document Revised: 08/02/2019 Document Reviewed: 12/11/2016 Elsevier Patient Education  2020 Elsevier Inc.  

## 2019-12-06 ENCOUNTER — Other Ambulatory Visit: Payer: Self-pay | Admitting: Pharmacy Technician

## 2019-12-06 NOTE — Patient Outreach (Signed)
[facility]) Care Management  12/06/2019  [name] [birth year] [id number]   Successful call placed to patient regarding patient assistance application(s) for Proventil HFA with [employer] , HIPAA identifiers verified.   Patient informed she has not received the application in the mail that was sent to her on 11/22/2019. Informed patient the application was sent in a [employer] envelope. She informed she had some mail from [employer] but upon opening it while on the line, patient informed it was not the letter from the pharmacist or the application. She inquired if another application could be mailed. Verified address with patient and informed another application would be mailed to her when I go back to the office. Patient verbalized understanding and was agreeable to this plan.  Follow up:  Will follow up with patient in 5-10 business days to confirm she received the 2nd mailed applicaiton.  Sheridyn Canino P. Krystall Kruckenberg, [place] Management [phone]

## 2019-12-07 NOTE — Progress Notes (Signed)
Subjective: [name] is a 74 y.o. y.o. female with h/o diabetes who presents today for preventative diabetic foot care. Patient has painful, elongated mycotic toenails which pose a risk and interfere with daily activities. Pain is aggravated when wearing enclosed shoe gear and relieved with periodic professional debridement.  Merrilee Seashore, MD is patient's PCP.   Medications reviewed in chart.  Allergies  Allergen Reactions  . Ivp Dye [Iodinated Diagnostic Agents] Nausea And Vomiting  . Prednisone Other (See Comments)    reflux   Objective: There were no vitals filed for this visit.  Vascular Examination: Capillary refill time to digits b/l.  Dorsalis pedis and posterior tibial pulses palpable.  Digital hair sparse b/l.  Skin temperature gradient WNL b/l.  Dermatological Examination: Skin with normal turgor, texture and tone b/l.  Anonychia right great toe(s) with evidence of permanent total nail avulsion. Nailbed(s) completely epithelialized and intact.  Toenails 2-5 b/l and left hallux discolored, thick, dystrophic with subungual debris and pain with palpation to nailbeds due to thickness of nails.  Hyperkeratotic lesions b/l hallux and left 5th digit. No erythema, no edema, no drainage, no flocculence noted.   Musculoskeletal: Muscle strength 5/5 to all LE muscle groups b/l.  Neurological: Sensation intact 5/5 b/l with 10 gram monofilament.  Vibratory sensation intact b/l.  Assessment: 1. Painful onychomycosis toenails 1-5 b/l 2.  Calluses b/l hallux 3.  Corn left 5th digit 4.  NIDDM  Plan: 1. Continue diabetic foot care principles. Literature dispensed on today. 2. Toenails 2-5 b/l and left hallux were debrided in length and girth without iatrogenic bleeding. 3. Hyperkeratotic lesion(s) b/l hallux and left 5th digit pared with sterile scalpel blade without incident. 4. Patient to continue soft, supportive shoe gear daily. 5. Patient to report any  pedal injuries to medical professional immediately. 6. Follow up 3 months.  7. Patient/POA to call should there be a concern in the interim.

## 2019-12-13 ENCOUNTER — Other Ambulatory Visit: Payer: Self-pay | Admitting: Pharmacy Technician

## 2019-12-13 NOTE — Patient Outreach (Signed)
[place] [facility]) Care Management  12/13/2019  [name] [birth year] [id number]  Successful call placed to patient regard patient assistance application for Proventil HFA with [employer].  Spoke to patient, HIPAA identifiers verified.  Patient informed she still has not received either application that was mailed to her on 12/30 and again on 1/14. Informed her the application was mailed in a  [employer] envelope. Informed patient that I would follow up with her early next week to inquire if application had arrived as the [employer]PS is experiencing  mail delivery delays. Patient was agreeable to this plan.  Will follow up with patient in 3-7 business days.  Smith Potenza P. Kennetha Pearman, [place] Management [phone]

## 2019-12-18 ENCOUNTER — Other Ambulatory Visit: Payer: Self-pay | Admitting: Pharmacist

## 2019-12-18 NOTE — Patient Outreach (Signed)
[place] [facility]) Care Management  12/18/2019  [name] 1946-03-17 [id number]   Patient called and wanted confirm receipt of the patient assistance applications that were mailed to her. She said she received both sets of applications on the same day. HIPAA identifiers were obtained. We also discussed the necessary financial paperwork needed to accompany her applications.  Plan: Susy Frizzle will follow for patient assistance. Follow up with patient in 6-8 weeks.  Elayne Guerin, PharmD, [place] Clinical Pharmacist [phone]

## 2019-12-20 ENCOUNTER — Other Ambulatory Visit: Payer: Self-pay | Admitting: Pharmacy Technician

## 2019-12-21 NOTE — Patient Outreach (Signed)
[place] [facility]) Care Management  12/21/2019  [name] [birth year] [id number]    Successful call placed to patient regarding patient assistance application(s) for Proventil HFA wirh [employer] , HIPAA identifiers verified.   Patient informed she received the application and placed in mail this week.  Follow up:  Will route note to [place] for case closure if document(s) have not been received in the next 15 business days.  [name] P. Krithik Mapel, [place] Management [phone]

## 2019-12-26 ENCOUNTER — Other Ambulatory Visit: Payer: Self-pay | Admitting: Pharmacy Technician

## 2019-12-26 NOTE — Patient Outreach (Signed)
[place] [facility]) Care Management  12/26/2019  NAVIL OLAGUEZ [birth year] [id number]    Received both patient and provider portion(s) of patient assistance application(s) for Proventil HFA. Mailed completed application and required documents into [employer].  Will follow up with company(ies) in 20-30 business days to check status of application(s).  Niyana Chesbro P. Tiffanni Scarfo, [place] Management [phone]

## 2019-12-28 DIAGNOSIS — E782 Mixed hyperlipidemia: Secondary | ICD-10-CM | POA: Diagnosis not present

## 2019-12-28 DIAGNOSIS — Z0000 Encounter for general adult medical examination without abnormal findings: Secondary | ICD-10-CM | POA: Diagnosis not present

## 2019-12-28 DIAGNOSIS — E1122 Type 2 diabetes mellitus with diabetic chronic kidney disease: Secondary | ICD-10-CM | POA: Diagnosis not present

## 2020-01-09 DIAGNOSIS — E1122 Type 2 diabetes mellitus with diabetic chronic kidney disease: Secondary | ICD-10-CM | POA: Diagnosis not present

## 2020-01-09 DIAGNOSIS — M0609 Rheumatoid arthritis without rheumatoid factor, multiple sites: Secondary | ICD-10-CM | POA: Diagnosis not present

## 2020-01-09 DIAGNOSIS — E782 Mixed hyperlipidemia: Secondary | ICD-10-CM | POA: Diagnosis not present

## 2020-01-09 DIAGNOSIS — J449 Chronic obstructive pulmonary disease, unspecified: Secondary | ICD-10-CM | POA: Diagnosis not present

## 2020-01-09 DIAGNOSIS — N182 Chronic kidney disease, stage 2 (mild): Secondary | ICD-10-CM | POA: Diagnosis not present

## 2020-01-09 DIAGNOSIS — E1142 Type 2 diabetes mellitus with diabetic polyneuropathy: Secondary | ICD-10-CM | POA: Diagnosis not present

## 2020-01-09 DIAGNOSIS — Z0000 Encounter for general adult medical examination without abnormal findings: Secondary | ICD-10-CM | POA: Diagnosis not present

## 2020-01-09 DIAGNOSIS — I700 Atherosclerosis of aorta: Secondary | ICD-10-CM | POA: Diagnosis not present

## 2020-01-09 DIAGNOSIS — E785 Hyperlipidemia, unspecified: Secondary | ICD-10-CM | POA: Diagnosis not present

## 2020-01-09 DIAGNOSIS — I10 Essential (primary) hypertension: Secondary | ICD-10-CM | POA: Diagnosis not present

## 2020-01-10 ENCOUNTER — Ambulatory Visit: Payer: Self-pay | Admitting: Pharmacist

## 2020-01-16 ENCOUNTER — Other Ambulatory Visit: Payer: Self-pay

## 2020-01-16 NOTE — Patient Outreach (Signed)
  [place] [facility]) Care Management Chronic Special Needs Program  01/16/2020  Name: [name] DOB: [birth year]  MRN: [id number]  [name] is enrolled in a chronic special needs plan for Diabetes. Client called with no answer No answer and HIPAA compliant message left. 1st attempt Plan for 2nd outreach call in one week Chronic care management coordinator will attempt outreach in one week.   Peter Garter RN, Jackquline Denmark, CDE Chronic Care Management Coordinator [place] [facility] Care Management [phone]

## 2020-01-17 ENCOUNTER — Encounter (INDEPENDENT_AMBULATORY_CARE_PROVIDER_SITE_OTHER): Payer: HMO | Admitting: Ophthalmology

## 2020-01-17 ENCOUNTER — Other Ambulatory Visit: Payer: Self-pay

## 2020-01-17 DIAGNOSIS — H43813 Vitreous degeneration, bilateral: Secondary | ICD-10-CM | POA: Diagnosis not present

## 2020-01-17 DIAGNOSIS — E113293 Type 2 diabetes mellitus with mild nonproliferative diabetic retinopathy without macular edema, bilateral: Secondary | ICD-10-CM

## 2020-01-17 DIAGNOSIS — E11319 Type 2 diabetes mellitus with unspecified diabetic retinopathy without macular edema: Secondary | ICD-10-CM

## 2020-01-17 DIAGNOSIS — H35033 Hypertensive retinopathy, bilateral: Secondary | ICD-10-CM | POA: Diagnosis not present

## 2020-01-17 DIAGNOSIS — I10 Essential (primary) hypertension: Secondary | ICD-10-CM

## 2020-01-24 ENCOUNTER — Other Ambulatory Visit: Payer: Self-pay

## 2020-01-24 NOTE — Patient Outreach (Signed)
  [place] [facility]) Care Management Chronic Special Needs Program  01/24/2020  Name: GENIAH WILLISTON DOB: 17-Oct-1946  MRN: [id number]  Ms. Gizzel Hoffmeister is enrolled in a chronic special needs plan for Diabetes. Client called with no answer No answer and HIPAA compliant message left. 2nd attempt Plan for 3rd outreach call in one week Chronic care management coordinator will attempt outreach in one week.   Peter Garter RN, Jackquline Denmark, CDE Chronic Care Management Coordinator [place] [facility] Care Management [phone]

## 2020-01-30 ENCOUNTER — Other Ambulatory Visit: Payer: Self-pay

## 2020-01-30 NOTE — Patient Outreach (Signed)
[place] [facility]) Care Management Chronic Special Needs Program  01/30/2020  Name: [name] DOB: [birth year]  MRN: [id number]  Ms. [name] is enrolled in a chronic special needs plan for  Diabetes.  Client called with no answer and HIPAA compliant message left.  3rd attempt A completed 2021 health risk assessment has not been received from the client and client has not responded to 3 outreach attempts by [facility] to complete 2021 Health Risk Assessment  The client's individualized care plan was developed based on available data and previous 2020 Health Risk Assessment Goals Addressed            This Visit's Progress   . Client understands the importance of follow-up with providers by attending scheduled visits   On track    Plan to keep scheduled appointments with providers    . Client will report no worsening of symptoms related to heart disease within the next 6 months(continue 08/15/19)   On track    No ED or hospital visits for heart disease Call doctor for severe symptoms Follow a low salt health healthy diet Send EMMI Heart Disease in diabetics    . Client will use [employer] as needed and verbalize understanding of device use   On track    Maintaining refills of diabetes testing supplies per dispense record    . Client will verbalize knowledge of chronic lung disease as evidenced by no ED visits or Inpatient stays related to chronic lung disease    On track    No ED or hospital visits for COPD Please review COPD action plan in [employer]) calendar Send EMMI: COPD: What you can do    . Client will verbalize knowledge of self management of Hypertension as evidences by BP reading of 140/90 or less; or as defined by provider   On track    B/P readings less than 140/90 at provider visits per record Plan to check B/P regularly Take B/P medications as ordered Plan to follow a low salt diet  Increase activity as tolerated Send EMMI-High  Blood Pressure(Hypertension): What you can do     . Diabetes Patient stated goal to walk 30 minutes 3 times a week for the next 6 months( continue 08/15/19) (pt-stated)   Not on track    Try to walk for 15 minutes twice a day Exercise can help lower your blood sugars and help you control your B/P    . HEMOGLOBIN A1C < 7.0       Last Hemoglobin A1C 5.9% on 08/11/19 Plan to check blood sugars as directed with goals fasting or 1 1/2 hours after eating with goal of 80-130 fasting and 180 or less after meals Plan to follow a low carbohydrate, low salt diet, watch portion sizes and avoid sugar sweetened drinks    . Maintain timely refills of diabetic medication as prescribed within the year .   On track    Maintaining timely refills of medications per dispense report It is important to get your medications refilled on time    . Obtain annual  Lipid Profile, LDL-C   On track    Last completed 03/24/2019 LDL 73 The goal for LDL is less than 70 mg/dL as you are at high risk for complications Try to avoid saturated fats, trans-fats and eat more fiber Plan to take statin as ordered    . Obtain Annual Eye (retinal)  Exam    On track    Last completed 07/25/19  Plan to have a dilated eye exam every year    . Obtain Annual Foot Exam   On track    Last completed 05/19/2019 Check your skin and feet every day for cuts, bruises, redness, blisters, or sores. Schedule a foot exam with your health care provider once every year    . Obtain annual screen for micro albuminuria (urine) , nephropathy (kidney problems)   On track    Last completed 12/16/2018 It is important for your doctor to check your urine for protein at least every year    . Obtain Hemoglobin A1C at least 2 times per year   On track    Last completed 08/11/19 It is important to have your Hemoglobin A1C checked every 6 months if you are at goal and every 3 months if you are not at goal    . Visit Primary Care Provider or Endocrinologist at least  2 times per year    On track    Last primary care visit 09/19/19 Last Annual Wellness visit 12/16/18 Please schedule your annual wellness visit       Client is  meeting diabetes self management goal of hemoglobin A1C of <7% with last reading of 5.9%  Plan:  . Send unsuccessful outreach letter with a copy of individualized care plan to client . Send individualized care plan to provider . Educational Materials: EMMI-High Blood Pressure(Hypertension): What you can do, COPD: What you can do, Heart Disease in diabetics  Chronic care management coordinator will attempt outreach in 3 months.  Peter Garter RN, Jackquline Denmark, CDE Chronic Care Management Coordinator [place] [facility] Care Management [phone]

## 2020-02-05 ENCOUNTER — Other Ambulatory Visit: Payer: Self-pay | Admitting: Pharmacy Technician

## 2020-02-05 NOTE — Patient Outreach (Signed)
[place] [facility]) Care Management  02/05/2020  YSELA GEELAN 1946-02-05 [id number]   Care coordination call placed to [employer] in regards to patient's application for Proventil HFA.  Spoke to [place] who informed they have not received the application that we mailed to them on 12/26/2019.  Will follow up with [employer] in 10-14 business days.  Mykah Shin P. Jerran Tappan, [place]  [phone]

## 2020-02-07 ENCOUNTER — Other Ambulatory Visit: Payer: Self-pay | Admitting: Pharmacy Technician

## 2020-02-07 NOTE — Patient Outreach (Signed)
[place] [facility]) Care Management  02/07/2020  [name] 05/09/1946 [id number]  Care coordination call placed to [employer] in regards to patient's application for Proventil HFA.  Spoke to [place] at [employer] who informed they have received the application as of [id number]. She informed the attestation form is being mailed out to patient today 02/07/2020 and should arrive at patient's address in 7-10 days. Once attestation form is received back, then the application can continue to process.  Spoke to patient, HIPAA identifiers verified. Informed patient that [employer] had mailed out the attestation form to her. Informed patient to outreach me when she has received it and we could go over the form together. Patient verbalized understanding.  Will follow up with patient in 10-14 business days if call is not returned.  Arryanna Holquin P. Janaria Mccammon, [place]  [phone]

## 2020-02-20 ENCOUNTER — Other Ambulatory Visit: Payer: Self-pay | Admitting: Pharmacy Technician

## 2020-02-20 NOTE — Patient Outreach (Signed)
[place] [facility]) Care Management  02/20/2020  NUHA VALDIVIESO 1946/06/10 [id number]     Incoming call from patient regarding patient assistance receipt of attestation form from [place] for Proventil HFA, HIPAA identifiers verified.   Patient called to inform she received the attestation form. Patient was able to complete the form while on the line. Patient informed she would place in mail this week  Follow up:  Will follow up with [employer] in 10-14 business days to inquire if attestation form was received.  Myan Suit P. Orla Jolliff, [place]  [phone]

## 2020-02-28 ENCOUNTER — Other Ambulatory Visit: Payer: Self-pay | Admitting: Pharmacist

## 2020-02-28 NOTE — Patient Outreach (Signed)
[facility] ([facility])  [facility] Quality Pharmacy Team    [facility] pharmacy case will be closed as our team is transitioning from the [facility] Care Management Department into the [facility] Quality Department and will no longer be using [facility] for documentation purposes.   [facility] pharmacy technician will continue to assist patient with medication assistance program applications until complete.       Navdeep Fessenden J. Blayke Cordrey, PharmD, BCACP [facility] Clinical Pharmacist [phone]   

## 2020-02-29 ENCOUNTER — Ambulatory Visit: Payer: Self-pay | Admitting: Pharmacist

## 2020-03-08 ENCOUNTER — Other Ambulatory Visit: Payer: Self-pay | Admitting: Pharmacy Technician

## 2020-03-08 NOTE — Patient Outreach (Signed)
[facility]) Care Management  03/08/2020  [name] 07-Jun-1946 [id number]  Care coordination call placed to [employer] in regards to patient's application for Proventil HFA.  Spoke to [place] who informed patient was APPROVED 02/26/2020-11/22/2020. She informed the information was sent to their pharmacy, [facility] on 02/27/2020. She informed the medication was processed on 03/06/2020 and should arrive at patient's home within 10 business days of that date.  Will follow up with patient in 10-14 business days to confirm receipt of medication and to discuss refill procedure.  Milind Raether P. Zena Vitelli, [place]  [phone]

## 2020-03-11 ENCOUNTER — Encounter: Payer: Self-pay | Admitting: Podiatry

## 2020-03-11 ENCOUNTER — Ambulatory Visit: Payer: HMO | Admitting: Podiatry

## 2020-03-11 ENCOUNTER — Other Ambulatory Visit: Payer: Self-pay

## 2020-03-11 DIAGNOSIS — M79675 Pain in left toe(s): Secondary | ICD-10-CM

## 2020-03-11 DIAGNOSIS — M79674 Pain in right toe(s): Secondary | ICD-10-CM

## 2020-03-11 DIAGNOSIS — B351 Tinea unguium: Secondary | ICD-10-CM

## 2020-03-11 NOTE — Patient Instructions (Signed)
Diabetes Mellitus and Foot Care Foot care is an important part of your health, especially when you have diabetes. Diabetes may cause you to have problems because of poor blood flow (circulation) to your feet and legs, which can cause your skin to:  Become thinner and drier.  Break more easily.  Heal more slowly.  Peel and crack. You may also have nerve damage (neuropathy) in your legs and feet, causing decreased feeling in them. This means that you may not notice minor injuries to your feet that could lead to more serious problems. Noticing and addressing any potential problems early is the best way to prevent future foot problems. How to care for your feet Foot hygiene  Wash your feet daily with warm water and mild soap. Do not use hot water. Then, pat your feet and the areas between your toes until they are completely dry. Do not soak your feet as this can dry your skin.  Trim your toenails straight across. Do not dig under them or around the cuticle. File the edges of your nails with an emery board or nail file.  Apply a moisturizing lotion or petroleum jelly to the skin on your feet and to dry, brittle toenails. Use lotion that does not contain alcohol and is unscented. Do not apply lotion between your toes. Shoes and socks  Wear clean socks or stockings every day. Make sure they are not too tight. Do not wear knee-high stockings since they may decrease blood flow to your legs.  Wear shoes that fit properly and have enough cushioning. Always look in your shoes before you put them on to be sure there are no objects inside.  To break in new shoes, wear them for just a few hours a day. This prevents injuries on your feet. Wounds, scrapes, corns, and calluses  Check your feet daily for blisters, cuts, bruises, sores, and redness. If you cannot see the bottom of your feet, use a mirror or ask someone for help.  Do not cut corns or calluses or try to remove them with medicine.  If you  find a minor scrape, cut, or break in the skin on your feet, keep it and the skin around it clean and dry. You may clean these areas with mild soap and water. Do not clean the area with peroxide, alcohol, or iodine.  If you have a wound, scrape, corn, or callus on your foot, look at it several times a day to make sure it is healing and not infected. Check for: ? Redness, swelling, or pain. ? Fluid or blood. ? Warmth. ? Pus or a bad smell. General instructions  Do not cross your legs. This may decrease blood flow to your feet.  Do not use heating pads or hot water bottles on your feet. They may burn your skin. If you have lost feeling in your feet or legs, you may not know this is happening until it is too late.  Protect your feet from hot and cold by wearing shoes, such as at the beach or on hot pavement.  Schedule a complete foot exam at least once a year (annually) or more often if you have foot problems. If you have foot problems, report any cuts, sores, or bruises to your health care provider immediately. Contact a health care provider if:  You have a medical condition that increases your risk of infection and you have any cuts, sores, or bruises on your feet.  You have an injury that is not   healing.  You have redness on your legs or feet.  You feel burning or tingling in your legs or feet.  You have pain or cramps in your legs and feet.  Your legs or feet are numb.  Your feet always feel cold.  You have pain around a toenail. Get help right away if:  You have a wound, scrape, corn, or callus on your foot and: ? You have pain, swelling, or redness that gets worse. ? You have fluid or blood coming from the wound, scrape, corn, or callus. ? Your wound, scrape, corn, or callus feels warm to the touch. ? You have pus or a bad smell coming from the wound, scrape, corn, or callus. ? You have a fever. ? You have a red line going up your leg. Summary  Check your feet every day  for cuts, sores, red spots, swelling, and blisters.  Moisturize feet and legs daily.  Wear shoes that fit properly and have enough cushioning.  If you have foot problems, report any cuts, sores, or bruises to your health care provider immediately.  Schedule a complete foot exam at least once a year (annually) or more often if you have foot problems. This information is not intended to replace advice given to you by your health care provider. Make sure you discuss any questions you have with your health care provider. Document Revised: 08/02/2019 Document Reviewed: 12/11/2016 Elsevier Patient Education  2020 Elsevier Inc.  Corns and Calluses Corns are small areas of thickened skin that occur on the top, sides, or tip of a toe. They contain a cone-shaped core with a point that can press on a nerve below. This causes pain.  Calluses are areas of thickened skin that can occur anywhere on the body, including the hands, fingers, palms, soles of the feet, and heels. Calluses are usually larger than corns. What are the causes? Corns and calluses are caused by rubbing (friction) or pressure, such as from shoes that are too tight or do not fit properly. What increases the risk? Corns are more likely to develop in people who have misshapen toes (toe deformities), such as hammer toes. Calluses can occur with friction to any area of the skin. They are more likely to develop in people who:  Work with their hands.  Wear shoes that fit poorly, are too tight, or are high-heeled.  Have toe deformities. What are the signs or symptoms? Symptoms of a corn or callus include:  A hard growth on the skin.  Pain or tenderness under the skin.  Redness and swelling.  Increased discomfort while wearing tight-fitting shoes, if your feet are affected. If a corn or callus becomes infected, symptoms may include:  Redness and swelling that gets worse.  Pain.  Fluid, blood, or pus draining from the corn or  callus. How is this diagnosed? Corns and calluses may be diagnosed based on your symptoms, your medical history, and a physical exam. How is this treated? Treatment for corns and calluses may include:  Removing the cause of the friction or pressure. This may involve: ? Changing your shoes. ? Wearing shoe inserts (orthotics) or other protective layers in your shoes, such as a corn pad. ? Wearing gloves.  Applying medicine to the skin (topical medicine) to help soften skin in the hardened, thickened areas.  Removing layers of dead skin with a file to reduce the size of the corn or callus.  Removing the corn or callus with a scalpel or laser.  Taking   antibiotic medicines, if your corn or callus is infected.  Having surgery, if a toe deformity is the cause. Follow these instructions at home:   Take over-the-counter and prescription medicines only as told by your health care provider.  If you were prescribed an antibiotic, take it as told by your health care provider. Do not stop taking it even if your condition starts to improve.  Wear shoes that fit well. Avoid wearing high-heeled shoes and shoes that are too tight or too loose.  Wear any padding, protective layers, gloves, or orthotics as told by your health care provider.  Soak your hands or feet and then use a file or pumice stone to soften your corn or callus. Do this as told by your health care provider.  Check your corn or callus every day for symptoms of infection. Contact a health care provider if you:  Notice that your symptoms do not improve with treatment.  Have redness or swelling that gets worse.  Notice that your corn or callus becomes painful.  Have fluid, blood, or pus coming from your corn or callus.  Have new symptoms. Summary  Corns are small areas of thickened skin that occur on the top, sides, or tip of a toe.  Calluses are areas of thickened skin that can occur anywhere on the body, including the  hands, fingers, palms, and soles of the feet. Calluses are usually larger than corns.  Corns and calluses are caused by rubbing (friction) or pressure, such as from shoes that are too tight or do not fit properly.  Treatment may include wearing any padding, protective layers, gloves, or orthotics as told by your health care provider. This information is not intended to replace advice given to you by your health care provider. Make sure you discuss any questions you have with your health care provider. Document Revised: 03/01/2019 Document Reviewed: 09/22/2017 Elsevier Patient Education  2020 Elsevier Inc.  Onychomycosis/Fungal Toenails  WHAT IS IT? An infection that lies within the keratin of your nail plate that is caused by a fungus.  WHY ME? Fungal infections affect all ages, sexes, races, and creeds.  There may be many factors that predispose you to a fungal infection such as age, coexisting medical conditions such as diabetes, or an autoimmune disease; stress, medications, fatigue, genetics, etc.  Bottom line: fungus thrives in a warm, moist environment and your shoes offer such a location.  IS IT CONTAGIOUS? Theoretically, yes.  You do not want to share shoes, nail clippers or files with someone who has fungal toenails.  Walking around barefoot in the same room or sleeping in the same bed is unlikely to transfer the organism.  It is important to realize, however, that fungus can spread easily from one nail to the next on the same foot.  HOW DO WE TREAT THIS?  There are several ways to treat this condition.  Treatment may depend on many factors such as age, medications, pregnancy, liver and kidney conditions, etc.  It is best to ask your doctor which options are available to you.  8. No treatment.   Unlike many other medical concerns, you can live with this condition.  However for many people this can be a painful condition and may lead to ingrown toenails or a bacterial infection.  It is  recommended that you keep the nails cut short to help reduce the amount of fungal nail. 9. Topical treatment.  These range from herbal remedies to prescription strength nail lacquers.  About 40-50%   effective, topicals require twice daily application for approximately 9 to 12 months or until an entirely new nail has grown out.  The most effective topicals are medical grade medications available through physicians offices. 10. Oral antifungal medications.  With an 80-90% cure rate, the most common oral medication requires 3 to 4 months of therapy and stays in your system for a year as the new nail grows out.  Oral antifungal medications do require blood work to make sure it is a safe drug for you.  A liver function panel will be performed prior to starting the medication and after the first month of treatment.  It is important to have the blood work performed to avoid any harmful side effects.  In general, this medication safe but blood work is required. 11. Laser Therapy.  This treatment is performed by applying a specialized laser to the affected nail plate.  This therapy is noninvasive, fast, and non-painful.  It is not covered by insurance and is therefore, out of pocket.  The results have been very good with a 80-95% cure rate.  The [facility] is the only practice in the area to offer this therapy. 12. Permanent Nail Avulsion.  Removing the entire nail so that a new nail will not grow back. 

## 2020-03-14 NOTE — Progress Notes (Signed)
Subjective: [name] presents today for follow up of preventative diabetic foot care and painful mycotic nails b/l that are difficult to trim. Pain interferes with ambulation. Aggravating factors include wearing enclosed shoe gear. Pain is relieved with periodic professional debridement.   Allergies  Allergen Reactions  . Ivp Dye [Iodinated Diagnostic Agents] Nausea And Vomiting  . Prednisone Other (See Comments)    reflux     Objective: There were no vitals filed for this visit.  Pt 74 y.o. year old female  in NAD. AAO x 3.   Vascular Examination:  Capillary refill time to digits immediate b/l. Palpable DP pulses b/l. Palpable PT pulses b/l. Pedal hair sparse b/l. Skin temperature gradient within normal limits b/l.  Dermatological Examination: Pedal skin with normal turgor, texture and tone bilaterally. No open wounds bilaterally. No interdigital macerations bilaterally. Toenails 2-5 bilaterally elongated, dystrophic, thickened, and crumbly with subungual debris and tenderness to dorsal palpation. Anonychia noted L hallux and R hallux. Nailbed(s) epithelialized.   Musculoskeletal: Normal muscle strength 5/5 to all lower extremity muscle groups bilaterally, no gross bony deformities bilaterally and no pain crepitus or joint limitation noted with ROM b/l  Neurological: Protective sensation intact 5/5 intact bilaterally with 10g monofilament b/l. Vibratory sensation intact b/l. Babinski reflex negative b/l. Clonus negative b/l.  Assessment: 1. Pain due to onychomycosis of toenails of both feet    Plan: -Continue diabetic foot care principles. Literature dispensed on today.  -Toenails 1-5 b/l were debrided in length and girth with sterile nail nippers and dremel without iatrogenic bleeding.  -Patient to continue soft, supportive shoe gear daily. -Patient to report any pedal injuries to medical professional immediately. -Patient/POA to call should there be question/concern in the  interim.  Return in about 3 months (around 06/10/2020) for diabetic nail and callus trim.

## 2020-03-18 ENCOUNTER — Other Ambulatory Visit: Payer: Self-pay | Admitting: Pharmacy Technician

## 2020-03-18 NOTE — Patient Outreach (Signed)
[facility]) Care Management  03/18/2020  DYANN BRANDELL 1946-04-03 [id number]  Care coordination call placed to [facility] in regards to [employer] application for Proventil HFA.  Spoke to [place] who informed she would process the request today and have it mailed out today with a delivery date of Wednesday 03/20/2020.  Will follow up with patient in 3-5 business days to confirm receipt of medication.  Jochebed Bills P. Kindra Bickham, [place]  [phone]

## 2020-03-21 ENCOUNTER — Other Ambulatory Visit: Payer: Self-pay | Admitting: Pharmacy Technician

## 2020-03-21 NOTE — Patient Outreach (Signed)
[place] [facility]) Care Management  03/21/2020  [name] 1946-05-10 [id number]   Unsuccessful call placed to patient regarding patient assistance medication delivery of Proventil HFA from [employer], HIPAA compliant voicemail left.   Was calling to inquire if patient received the medicaiton and to discuss refill procedure.  Follow up:  Will follow up with patient in 5-7 business days if call is not returned.  [name] P. Cottrell Gentles, [place]  [phone]

## 2020-03-27 ENCOUNTER — Other Ambulatory Visit: Payer: Self-pay | Admitting: Pharmacy Technician

## 2020-03-27 NOTE — Patient Outreach (Signed)
[place] [facility]) Care Management  03/27/2020  [name] 1945/12/24 [id number]    Successful call placed to patient regarding patient assistance medication delivery of Proventil HFA from [place], HIPAA identifiers verified.   Patient informed she received the inhalers from [place]. Discussed refill procedure with patient which requires patient to call [facility] to place her refills. Informed patient that this was a change from last year and informed her where to find the number on the pharmacy label. Patient verbalized understanding. Confirmed patient had name and number.  Follow up:  Will route note to [place] for case closure as patient assistance has been completed and will remove myself from care team.  Luiz Ochoa. Neymar Dowe, [place]  [phone]

## 2020-04-08 DIAGNOSIS — M0609 Rheumatoid arthritis without rheumatoid factor, multiple sites: Secondary | ICD-10-CM | POA: Diagnosis not present

## 2020-04-08 DIAGNOSIS — M79643 Pain in unspecified hand: Secondary | ICD-10-CM | POA: Diagnosis not present

## 2020-04-08 DIAGNOSIS — M25569 Pain in unspecified knee: Secondary | ICD-10-CM | POA: Diagnosis not present

## 2020-04-08 DIAGNOSIS — Z79899 Other long term (current) drug therapy: Secondary | ICD-10-CM | POA: Diagnosis not present

## 2020-04-08 DIAGNOSIS — M7072 Other bursitis of hip, left hip: Secondary | ICD-10-CM | POA: Diagnosis not present

## 2020-04-08 DIAGNOSIS — M1990 Unspecified osteoarthritis, unspecified site: Secondary | ICD-10-CM | POA: Diagnosis not present

## 2020-04-08 DIAGNOSIS — M5416 Radiculopathy, lumbar region: Secondary | ICD-10-CM | POA: Diagnosis not present

## 2020-04-08 DIAGNOSIS — E119 Type 2 diabetes mellitus without complications: Secondary | ICD-10-CM | POA: Diagnosis not present

## 2020-04-08 DIAGNOSIS — I10 Essential (primary) hypertension: Secondary | ICD-10-CM | POA: Diagnosis not present

## 2020-04-08 DIAGNOSIS — Z8739 Personal history of other diseases of the musculoskeletal system and connective tissue: Secondary | ICD-10-CM | POA: Diagnosis not present

## 2020-04-26 ENCOUNTER — Other Ambulatory Visit: Payer: Self-pay

## 2020-04-26 NOTE — Patient Outreach (Signed)
  [place] [facility]) Care Management Chronic Special Needs Program  04/26/2020  Name: [name] DOB: October 21, 1946  MRN: [id number]  [name] is enrolled in a chronic special needs plan for Diabetes. Client called with no answer No answer and HIPAA compliant message left. 1st attempt Plan for 2nd outreach call in 1-2  weeks Chronic care management coordinator will attempt outreach in 1-2 weeks.   Peter Garter RN, Jackquline [place], CDE Chronic Care Management Coordinator [facility] [phone]

## 2020-05-10 ENCOUNTER — Other Ambulatory Visit: Payer: Self-pay

## 2020-05-10 NOTE — Patient Outreach (Signed)
  [place] [facility]) Care Management Chronic Special Needs Program  05/10/2020  Name: [name] DOB: 11-11-1946  MRN: [id number]  Ms. Kynnedi Zweig is enrolled in a chronic special needs plan for Diabetes. Client called with no answer No answer and HIPAA compliant message left. 2nd attempt Plan for 3rd outreach call in 1-2 week Chronic care management coordinator will attempt outreach in 1-2 weeks .   Peter Garter RN, Jackquline Denmark, CDE Chronic Care Management Coordinator [facility] Care Management [phone]

## 2020-05-20 ENCOUNTER — Other Ambulatory Visit: Payer: Self-pay

## 2020-05-20 NOTE — Patient Outreach (Signed)
[facility]) Care Management Chronic Special Needs Program  05/20/2020  Name: [name] DOB: 1945/12/19  MRN: [id number]  Ms. Biance Moncrief is enrolled in a chronic special needs plan for Diabetes. Chronic Care Management Coordinator telephoned client to review health risk assessment and to update individualized care plan. Reviewed the chronic care management program, importance of client participation, and taking their care plan to all provider appointments and inpatient facilities.  Reviewed the transition of care process and possible referral to community care management.  Subjective: Client states that she has been feeling good.  States that she is planning to go back to work soon as she is tired of being cooped up in the house.  States she has had both of her COVID shots.  States her diabetes doing good and her Hemoglobin A1C was 5.9% when it was checked in February.  States she checks her blood sugars about 3 times a week.  States they range form 90-120.  States she sometimes wakes up during the night hungry and her blood sugars might be 86-90 so she will drink some Coke or eat something.  States she rheumatoid arthritis has been stable with her medications and she see her rheumatologist regularly.  Denies any issues with her breathing.  States her B/P is good when she has it checked.  Denies any chest pains or heart issues.  States she can afford her medications without difficulty now.  States she has not been exercising or walking recently but she plans to restart  Goals Addressed              This Visit's Progress     COMPLETED: Client understands the importance of follow-up with providers by attending scheduled visits   On track     Plan to keep scheduled appointments with providers Keeping scheduled appointments Goal completed 05/20/20      Client will report no worsening of symptoms related to heart disease within the next 12 months(continue 05/20/20)   On  track     No ED or hospital visits for heart disease Notify provider for symptoms of chest pain, sweating, nausea/vomiting, irregular heartbeat, palpitations, rapid heart rate, shortness of breath or dizziness or fainting. Call 911 for severe symptoms of chest pain or shortness of breath. Take medications as prescribed Follow a low salt health healthy diet       COMPLETED: Client will use Assistive Devices as needed and verbalize understanding of device use   On track     Maintaining refills of diabetes testing supplies and reports no issues with glucometer Goal completed 05/20/20      Client will verbalize knowledge of chronic lung disease as evidenced by no ED visits or Inpatient stays related to chronic lung disease    On track     Reports no changes in COPD with no ED or hospital visits Reviewed COPD action plan Contact your provider for any signs and symptoms of mild shortness of breath Call 911 if you are having severe shortness of breath      Client will verbalize knowledge of self management of Hypertension as evidences by BP reading of 140/90 or less; or as defined by provider   On track     B/P readings less than 140/90 at provider visits per record Plan to check B/P regularly Take B/P medications as ordered Plan to follow a low salt diet  Increase activity as tolerated       Diabetes Patient stated goal to walk  30 minutes 3 times a week for the next 12 months( continue 05/20/20) (pt-stated)   Not on track     Plan to go to large box store or Gym with your Silver Sneakers to walk in air conditioned area when it is hot Reviewed importance of exercise to controlling blood sugar and B/P Please try doing strength training exercises in the [employer]) exercise booklet you were sent      HEMOGLOBIN A1C < 7        Last Hemoglobin A1C 5.9% on 12/28/19 Check blood sugars daily either fasting or 1 1/2 hours after eating with goal of 80-130 fasting and 180 or less  after meals Reviewed fasting blood sugar goals of 80-130 and less than 180 1 1/2-2 hours after meals Reinforced to follow a low carbohydrate low salt diet and to watch portion sizes Reviewed use and possible side effects of diabetes medications  Reviewed signs and symptoms of hypoglycemia and actions to take Encouraged to eat a snack with protein at bedtime to prevent lower readings overnight Reviewed diabetes action plan in [employer] calendar Sent Low carbohydrate snack list      COMPLETED: Maintain timely refills of diabetic medication as prescribed within the year .   On track     Maintaining timely refills of medications per dispense report and reports no issues with cost It is important to get your medications refilled on time Goal completed 05/20/20      COMPLETED: Obtain annual  Lipid Profile, LDL-C   On track     Last completed 12/28/19 LDL 73 The goal for LDL is less than 70 mg/dL as you are at high risk for complications Try to avoid saturated fats, trans-fats and eat more fiber Plan to take statin as ordered Goal completed 05/20/20      COMPLETED: Beecher (retinal)  Exam    On track     Last completed 01/17/20 Plan to have a dilated eye exam every year Goal completed 05/20/20      COMPLETED: Obtain Annual Foot Exam   On track     Last completed 01/09/20 Check your skin and feet every day for cuts, bruises, redness, blisters, or sores. Schedule a foot exam with your health care provider once every year Goal completed 05/20/20      COMPLETED: Obtain annual screen for micro albuminuria (urine) , nephropathy (kidney problems)   On track     Last completed 12/28/19 It is important for your doctor to check your urine for protein at least every year Goal completed 6./28/21      Obtain Hemoglobin A1C at least 2 times per year   On track     Last completed 12/28/19 It is important to have your Hemoglobin A1C checked every 6 months if you are at goal and every 3  months if you are not at goal      Visit Primary Care Provider or Endocrinologist at least 2 times per year    On track     Last primary care visit 04/08/20 Last Annual Wellness visit 12/28/19 Please schedule your annual wellness visit for next year      Changed to tier 1 from tier 2 as client no longer meets criteria for tier 2- Plan:  Send successful outreach letter with a copy of their individualized care plan, Send individual care plan to provider and Send educational material-Low carbohydrate snack list    Client will be outreached by a [employer]) RNCM in 12 months per tier level   Peter Garter RN, [facility], [place] [facility] [phone]

## 2020-05-29 DIAGNOSIS — E1122 Type 2 diabetes mellitus with diabetic chronic kidney disease: Secondary | ICD-10-CM | POA: Diagnosis not present

## 2020-05-29 DIAGNOSIS — E782 Mixed hyperlipidemia: Secondary | ICD-10-CM | POA: Diagnosis not present

## 2020-05-29 DIAGNOSIS — N182 Chronic kidney disease, stage 2 (mild): Secondary | ICD-10-CM | POA: Diagnosis not present

## 2020-05-29 DIAGNOSIS — I10 Essential (primary) hypertension: Secondary | ICD-10-CM | POA: Diagnosis not present

## 2020-05-29 DIAGNOSIS — Z0000 Encounter for general adult medical examination without abnormal findings: Secondary | ICD-10-CM | POA: Diagnosis not present

## 2020-06-05 DIAGNOSIS — I700 Atherosclerosis of aorta: Secondary | ICD-10-CM | POA: Diagnosis not present

## 2020-06-05 DIAGNOSIS — E785 Hyperlipidemia, unspecified: Secondary | ICD-10-CM | POA: Diagnosis not present

## 2020-06-05 DIAGNOSIS — E1142 Type 2 diabetes mellitus with diabetic polyneuropathy: Secondary | ICD-10-CM | POA: Diagnosis not present

## 2020-06-05 DIAGNOSIS — N182 Chronic kidney disease, stage 2 (mild): Secondary | ICD-10-CM | POA: Diagnosis not present

## 2020-06-05 DIAGNOSIS — E1122 Type 2 diabetes mellitus with diabetic chronic kidney disease: Secondary | ICD-10-CM | POA: Diagnosis not present

## 2020-06-05 DIAGNOSIS — I10 Essential (primary) hypertension: Secondary | ICD-10-CM | POA: Diagnosis not present

## 2020-06-05 DIAGNOSIS — J449 Chronic obstructive pulmonary disease, unspecified: Secondary | ICD-10-CM | POA: Diagnosis not present

## 2020-06-05 DIAGNOSIS — E782 Mixed hyperlipidemia: Secondary | ICD-10-CM | POA: Diagnosis not present

## 2020-06-05 DIAGNOSIS — M0609 Rheumatoid arthritis without rheumatoid factor, multiple sites: Secondary | ICD-10-CM | POA: Diagnosis not present

## 2020-06-05 DIAGNOSIS — Z23 Encounter for immunization: Secondary | ICD-10-CM | POA: Diagnosis not present

## 2020-06-06 ENCOUNTER — Ambulatory Visit: Admission: EM | Admit: 2020-06-06 | Discharge: 2020-06-06 | Discharge status: Against Medical Advice | Payer: HMO

## 2020-06-06 DIAGNOSIS — Z96641 Presence of right artificial hip joint: Secondary | ICD-10-CM | POA: Diagnosis not present

## 2020-06-06 DIAGNOSIS — Z96642 Presence of left artificial hip joint: Secondary | ICD-10-CM | POA: Diagnosis not present

## 2020-06-06 DIAGNOSIS — M7061 Trochanteric bursitis, right hip: Secondary | ICD-10-CM | POA: Diagnosis not present

## 2020-06-10 ENCOUNTER — Other Ambulatory Visit: Payer: Self-pay

## 2020-06-10 ENCOUNTER — Ambulatory Visit: Payer: HMO | Admitting: Podiatry

## 2020-06-10 ENCOUNTER — Encounter: Payer: Self-pay | Admitting: Podiatry

## 2020-06-10 DIAGNOSIS — R269 Unspecified abnormalities of gait and mobility: Secondary | ICD-10-CM | POA: Diagnosis not present

## 2020-06-10 DIAGNOSIS — B351 Tinea unguium: Secondary | ICD-10-CM

## 2020-06-10 DIAGNOSIS — M79674 Pain in right toe(s): Secondary | ICD-10-CM

## 2020-06-10 DIAGNOSIS — L84 Corns and callosities: Secondary | ICD-10-CM

## 2020-06-10 DIAGNOSIS — M6281 Muscle weakness (generalized): Secondary | ICD-10-CM | POA: Diagnosis not present

## 2020-06-10 DIAGNOSIS — M79675 Pain in left toe(s): Secondary | ICD-10-CM

## 2020-06-10 DIAGNOSIS — R262 Difficulty in walking, not elsewhere classified: Secondary | ICD-10-CM | POA: Diagnosis not present

## 2020-06-10 DIAGNOSIS — E119 Type 2 diabetes mellitus without complications: Secondary | ICD-10-CM | POA: Diagnosis not present

## 2020-06-10 DIAGNOSIS — M79604 Pain in right leg: Secondary | ICD-10-CM | POA: Diagnosis not present

## 2020-06-10 NOTE — Progress Notes (Signed)
Subjective: [name] presents today for follow up of preventative diabetic foot care and painful mycotic nails b/l that are difficult to trim. Pain interferes with ambulation. Aggravating factors include wearing enclosed shoe gear. Pain is relieved with periodic professional debridement.   [name] states 3 months is too long between visits and states her toes are very painful due to the length/thickness of the toenails. She would like to come in sooner for her follow up appointment. She voices no new pedal problems on today's visit.  Allergies  Allergen Reactions  . Ivp Dye [Iodinated Diagnostic Agents] Nausea And Vomiting  . Prednisone Other (See Comments)    reflux     Objective: There were no vitals filed for this visit.  Pt is a pleasant 74 y.o. year old African American female  in NAD. AAO x 3.   Vascular Examination:  Capillary refill time to digits immediate b/l. Palpable DP pulses b/l. Palpable PT pulses b/l. Pedal hair sparse b/l. Skin temperature gradient within normal limits b/l.  Dermatological Examination: Pedal skin with normal turgor, texture and tone bilaterally. No open wounds bilaterally. No interdigital macerations bilaterally. Toenails 2-5 bilaterally elongated, discolored, dystrophic, thickened, and crumbly with subungual debris and tenderness to dorsal palpation. Anonychia noted L hallux and R hallux. Nailbed(s) epithelialized.  Hyperkeratotic lesion(s) L 5th toe.  No erythema, no edema, no drainage, no flocculence.  Musculoskeletal: Normal muscle strength 5/5 to all lower extremity muscle groups bilaterally, no gross bony deformities bilaterally and no pain crepitus or joint limitation noted with ROM b/l  Neurological: Protective sensation intact 5/5 intact bilaterally with 10g monofilament b/l. Vibratory sensation intact b/l. Babinski reflex negative b/l. Clonus negative b/l.  Assessment: 1. Pain due to onychomycosis of toenails of both feet   2. Corns    3. Controlled type 2 diabetes mellitus without complication, without long-term current use of insulin ([place])    Plan: -Examined patient. -Continue diabetic foot care principles. -Toenails 1-5 b/l were debrided in length and girth with sterile nail nippers and dremel without iatrogenic bleeding.  -Corn(s) L 5th toe pared utilizing sterile scalpel blade without complication or incident. Total number debrided=1. -Patient to report any pedal injuries to medical professional immediately. -Patient to continue soft, supportive shoe gear daily. -Patient/POA to call should there be question/concern in the interim.  Return in about 9 weeks (around 08/12/2020) for diabetic nail trim.

## 2020-06-10 NOTE — Patient Instructions (Signed)
Diabetes Mellitus and Foot Care Foot care is an important part of your health, especially when you have diabetes. Diabetes may cause you to have problems because of poor blood flow (circulation) to your feet and legs, which can cause your skin to:  Become thinner and drier.  Break more easily.  Heal more slowly.  Peel and crack. You may also have nerve damage (neuropathy) in your legs and feet, causing decreased feeling in them. This means that you may not notice minor injuries to your feet that could lead to more serious problems. Noticing and addressing any potential problems early is the best way to prevent future foot problems. How to care for your feet Foot hygiene  Wash your feet daily with warm water and mild soap. Do not use hot water. Then, pat your feet and the areas between your toes until they are completely dry. Do not soak your feet as this can dry your skin.  Trim your toenails straight across. Do not dig under them or around the cuticle. File the edges of your nails with an emery board or nail file.  Apply a moisturizing lotion or petroleum jelly to the skin on your feet and to dry, brittle toenails. Use lotion that does not contain alcohol and is unscented. Do not apply lotion between your toes. Shoes and socks  Wear clean socks or stockings every day. Make sure they are not too tight. Do not wear knee-high stockings since they may decrease blood flow to your legs.  Wear shoes that fit properly and have enough cushioning. Always look in your shoes before you put them on to be sure there are no objects inside.  To break in new shoes, wear them for just a few hours a day. This prevents injuries on your feet. Wounds, scrapes, corns, and calluses  Check your feet daily for blisters, cuts, bruises, sores, and redness. If you cannot see the bottom of your feet, use a mirror or ask someone for help.  Do not cut corns or calluses or try to remove them with medicine.  If you  find a minor scrape, cut, or break in the skin on your feet, keep it and the skin around it clean and dry. You may clean these areas with mild soap and water. Do not clean the area with peroxide, alcohol, or iodine.  If you have a wound, scrape, corn, or callus on your foot, look at it several times a day to make sure it is healing and not infected. Check for: ? Redness, swelling, or pain. ? Fluid or blood. ? Warmth. ? Pus or a bad smell. General instructions  Do not cross your legs. This may decrease blood flow to your feet.  Do not use heating pads or hot water bottles on your feet. They may burn your skin. If you have lost feeling in your feet or legs, you may not know this is happening until it is too late.  Protect your feet from hot and cold by wearing shoes, such as at the beach or on hot pavement.  Schedule a complete foot exam at least once a year (annually) or more often if you have foot problems. If you have foot problems, report any cuts, sores, or bruises to your health care provider immediately. Contact a health care provider if:  You have a medical condition that increases your risk of infection and you have any cuts, sores, or bruises on your feet.  You have an injury that is not   healing.  You have redness on your legs or feet.  You feel burning or tingling in your legs or feet.  You have pain or cramps in your legs and feet.  Your legs or feet are numb.  Your feet always feel cold.  You have pain around a toenail. Get help right away if:  You have a wound, scrape, corn, or callus on your foot and: ? You have pain, swelling, or redness that gets worse. ? You have fluid or blood coming from the wound, scrape, corn, or callus. ? Your wound, scrape, corn, or callus feels warm to the touch. ? You have pus or a bad smell coming from the wound, scrape, corn, or callus. ? You have a fever. ? You have a red line going up your leg. Summary  Check your feet every day  for cuts, sores, red spots, swelling, and blisters.  Moisturize feet and legs daily.  Wear shoes that fit properly and have enough cushioning.  If you have foot problems, report any cuts, sores, or bruises to your health care provider immediately.  Schedule a complete foot exam at least once a year (annually) or more often if you have foot problems. This information is not intended to replace advice given to you by your health care provider. Make sure you discuss any questions you have with your health care provider. Document Revised: 08/02/2019 Document Reviewed: 12/11/2016 Elsevier Patient Education  2020 [employer] Inc[employer]  Onychomycosis/Fungal Toenails  WHAT IS IT? An infection that lies within the keratin of your nail plate that is caused by a fungus.  WHY ME? Fungal infections affect all ages, sexes, races, and creeds.  There may be many factors that predispose you to a fungal infection such as age, coexisting medical conditions such as diabetes, or an autoimmune disease; stress, medications, fatigue, genetics, etc.  Bottom line: fungus thrives in a warm, moist environment and your shoes offer such a location.  IS IT CONTAGIOUS? Theoretically, yes.  You do not want to share shoes, nail clippers or files with someone who has fungal toenails.  Walking around barefoot in the same room or sleeping in the same bed is unlikely to transfer the organism.  It is important to realize, however, that fungus can spread easily from one nail to the next on the same foot.  HOW DO WE TREAT THIS?  There are several ways to treat this condition.  Treatment may depend on many factors such as age, medications, pregnancy, liver and kidney conditions, etc.  It is best to ask your doctor which options are available to you.  5. No treatment.   Unlike many other medical concerns, you can live with this condition.  However for many people this can be a painful condition and may lead to ingrown toenails or a bacterial  infection.  It is recommended that you keep the nails cut short to help reduce the amount of fungal nail. 6. Topical treatment.  These range from herbal remedies to prescription strength nail lacquers.  About 40-50% effective, topicals require twice daily application for approximately 9 to 12 months or until an entirely new nail has grown out.  The most effective topicals are medical grade medications available through physicians offices. 7. Oral antifungal medications.  With an 80-90% cure rate, the most common oral medication requires 3 to 4 months of therapy and stays in your system for a year as the new nail grows out.  Oral antifungal medications do require blood work to make   sure it is a safe drug for you.  A liver function panel will be performed prior to starting the medication and after the first month of treatment.  It is important to have the blood work performed to avoid any harmful side effects.  In general, this medication safe but blood work is required. 8. Laser Therapy.  This treatment is performed by applying a specialized laser to the affected nail plate.  This therapy is noninvasive, fast, and non-painful.  It is not covered by insurance and is therefore, out of pocket.  The results have been very good with a 80-95% cure rate.  The [facility] is the only practice in the area to offer this therapy. 9. Permanent Nail Avulsion.  Removing the entire nail so that a new nail will not grow back. 

## 2020-06-12 ENCOUNTER — Ambulatory Visit: Payer: HMO | Attending: Internal Medicine

## 2020-06-12 DIAGNOSIS — R269 Unspecified abnormalities of gait and mobility: Secondary | ICD-10-CM | POA: Diagnosis not present

## 2020-06-12 DIAGNOSIS — R262 Difficulty in walking, not elsewhere classified: Secondary | ICD-10-CM | POA: Diagnosis not present

## 2020-06-12 DIAGNOSIS — M79604 Pain in right leg: Secondary | ICD-10-CM | POA: Diagnosis not present

## 2020-06-12 DIAGNOSIS — Z20822 Contact with and (suspected) exposure to covid-19: Secondary | ICD-10-CM | POA: Diagnosis not present

## 2020-06-12 DIAGNOSIS — M6281 Muscle weakness (generalized): Secondary | ICD-10-CM | POA: Diagnosis not present

## 2020-06-13 LAB — NOVEL CORONAVIRUS, NAA: SARS-CoV-2, NAA: NOT DETECTED

## 2020-06-13 LAB — SARS-COV-2, NAA 2 DAY TAT

## 2020-06-18 DIAGNOSIS — M1991 Primary osteoarthritis, unspecified site: Secondary | ICD-10-CM | POA: Diagnosis not present

## 2020-06-18 DIAGNOSIS — M79604 Pain in right leg: Secondary | ICD-10-CM | POA: Diagnosis not present

## 2020-06-18 DIAGNOSIS — M6281 Muscle weakness (generalized): Secondary | ICD-10-CM | POA: Diagnosis not present

## 2020-06-18 DIAGNOSIS — R269 Unspecified abnormalities of gait and mobility: Secondary | ICD-10-CM | POA: Diagnosis not present

## 2020-06-21 DIAGNOSIS — R262 Difficulty in walking, not elsewhere classified: Secondary | ICD-10-CM | POA: Diagnosis not present

## 2020-06-21 DIAGNOSIS — M6281 Muscle weakness (generalized): Secondary | ICD-10-CM | POA: Diagnosis not present

## 2020-06-21 DIAGNOSIS — R269 Unspecified abnormalities of gait and mobility: Secondary | ICD-10-CM | POA: Diagnosis not present

## 2020-06-21 DIAGNOSIS — M79604 Pain in right leg: Secondary | ICD-10-CM | POA: Diagnosis not present

## 2020-06-25 DIAGNOSIS — R269 Unspecified abnormalities of gait and mobility: Secondary | ICD-10-CM | POA: Diagnosis not present

## 2020-06-25 DIAGNOSIS — M6281 Muscle weakness (generalized): Secondary | ICD-10-CM | POA: Diagnosis not present

## 2020-06-25 DIAGNOSIS — M79604 Pain in right leg: Secondary | ICD-10-CM | POA: Diagnosis not present

## 2020-06-25 DIAGNOSIS — R262 Difficulty in walking, not elsewhere classified: Secondary | ICD-10-CM | POA: Diagnosis not present

## 2020-06-27 DIAGNOSIS — M6281 Muscle weakness (generalized): Secondary | ICD-10-CM | POA: Diagnosis not present

## 2020-06-27 DIAGNOSIS — M79604 Pain in right leg: Secondary | ICD-10-CM | POA: Diagnosis not present

## 2020-06-27 DIAGNOSIS — R269 Unspecified abnormalities of gait and mobility: Secondary | ICD-10-CM | POA: Diagnosis not present

## 2020-06-27 DIAGNOSIS — R262 Difficulty in walking, not elsewhere classified: Secondary | ICD-10-CM | POA: Diagnosis not present

## 2020-07-02 DIAGNOSIS — M6281 Muscle weakness (generalized): Secondary | ICD-10-CM | POA: Diagnosis not present

## 2020-07-02 DIAGNOSIS — R262 Difficulty in walking, not elsewhere classified: Secondary | ICD-10-CM | POA: Diagnosis not present

## 2020-07-02 DIAGNOSIS — R269 Unspecified abnormalities of gait and mobility: Secondary | ICD-10-CM | POA: Diagnosis not present

## 2020-07-02 DIAGNOSIS — M79604 Pain in right leg: Secondary | ICD-10-CM | POA: Diagnosis not present

## 2020-07-04 DIAGNOSIS — M7061 Trochanteric bursitis, right hip: Secondary | ICD-10-CM | POA: Diagnosis not present

## 2020-07-04 DIAGNOSIS — R269 Unspecified abnormalities of gait and mobility: Secondary | ICD-10-CM | POA: Diagnosis not present

## 2020-07-04 DIAGNOSIS — M6281 Muscle weakness (generalized): Secondary | ICD-10-CM | POA: Diagnosis not present

## 2020-07-04 DIAGNOSIS — M79604 Pain in right leg: Secondary | ICD-10-CM | POA: Diagnosis not present

## 2020-07-04 DIAGNOSIS — R262 Difficulty in walking, not elsewhere classified: Secondary | ICD-10-CM | POA: Diagnosis not present

## 2020-07-04 DIAGNOSIS — M7062 Trochanteric bursitis, left hip: Secondary | ICD-10-CM | POA: Diagnosis not present

## 2020-07-09 DIAGNOSIS — M6281 Muscle weakness (generalized): Secondary | ICD-10-CM | POA: Diagnosis not present

## 2020-07-09 DIAGNOSIS — M1991 Primary osteoarthritis, unspecified site: Secondary | ICD-10-CM | POA: Diagnosis not present

## 2020-07-09 DIAGNOSIS — M79604 Pain in right leg: Secondary | ICD-10-CM | POA: Diagnosis not present

## 2020-07-09 DIAGNOSIS — R269 Unspecified abnormalities of gait and mobility: Secondary | ICD-10-CM | POA: Diagnosis not present

## 2020-07-18 DIAGNOSIS — R262 Difficulty in walking, not elsewhere classified: Secondary | ICD-10-CM | POA: Diagnosis not present

## 2020-07-18 DIAGNOSIS — R269 Unspecified abnormalities of gait and mobility: Secondary | ICD-10-CM | POA: Diagnosis not present

## 2020-07-18 DIAGNOSIS — M79604 Pain in right leg: Secondary | ICD-10-CM | POA: Diagnosis not present

## 2020-07-18 DIAGNOSIS — M6281 Muscle weakness (generalized): Secondary | ICD-10-CM | POA: Diagnosis not present

## 2020-08-06 DIAGNOSIS — M79604 Pain in right leg: Secondary | ICD-10-CM | POA: Diagnosis not present

## 2020-08-06 DIAGNOSIS — R262 Difficulty in walking, not elsewhere classified: Secondary | ICD-10-CM | POA: Diagnosis not present

## 2020-08-06 DIAGNOSIS — M6281 Muscle weakness (generalized): Secondary | ICD-10-CM | POA: Diagnosis not present

## 2020-08-06 DIAGNOSIS — R269 Unspecified abnormalities of gait and mobility: Secondary | ICD-10-CM | POA: Diagnosis not present

## 2020-08-15 DIAGNOSIS — M1990 Unspecified osteoarthritis, unspecified site: Secondary | ICD-10-CM | POA: Diagnosis not present

## 2020-08-15 DIAGNOSIS — I10 Essential (primary) hypertension: Secondary | ICD-10-CM | POA: Diagnosis not present

## 2020-08-15 DIAGNOSIS — N182 Chronic kidney disease, stage 2 (mild): Secondary | ICD-10-CM | POA: Diagnosis not present

## 2020-08-15 DIAGNOSIS — M0609 Rheumatoid arthritis without rheumatoid factor, multiple sites: Secondary | ICD-10-CM | POA: Diagnosis not present

## 2020-08-15 DIAGNOSIS — J449 Chronic obstructive pulmonary disease, unspecified: Secondary | ICD-10-CM | POA: Diagnosis not present

## 2020-08-15 DIAGNOSIS — Z23 Encounter for immunization: Secondary | ICD-10-CM | POA: Diagnosis not present

## 2020-08-15 DIAGNOSIS — E782 Mixed hyperlipidemia: Secondary | ICD-10-CM | POA: Diagnosis not present

## 2020-08-15 DIAGNOSIS — E1122 Type 2 diabetes mellitus with diabetic chronic kidney disease: Secondary | ICD-10-CM | POA: Diagnosis not present

## 2020-08-15 DIAGNOSIS — Z79899 Other long term (current) drug therapy: Secondary | ICD-10-CM | POA: Diagnosis not present

## 2020-09-16 ENCOUNTER — Ambulatory Visit: Payer: HMO | Admitting: Podiatry

## 2020-09-16 DIAGNOSIS — E119 Type 2 diabetes mellitus without complications: Secondary | ICD-10-CM | POA: Diagnosis not present

## 2020-09-16 DIAGNOSIS — H04123 Dry eye syndrome of bilateral lacrimal glands: Secondary | ICD-10-CM | POA: Diagnosis not present

## 2020-09-16 DIAGNOSIS — H524 Presbyopia: Secondary | ICD-10-CM | POA: Diagnosis not present

## 2020-09-16 DIAGNOSIS — Z961 Presence of intraocular lens: Secondary | ICD-10-CM | POA: Diagnosis not present

## 2020-09-16 DIAGNOSIS — H40013 Open angle with borderline findings, low risk, bilateral: Secondary | ICD-10-CM | POA: Diagnosis not present

## 2020-10-01 ENCOUNTER — Other Ambulatory Visit: Payer: Self-pay

## 2020-10-01 NOTE — Patient Outreach (Signed)
  [facility]) Care Management Chronic Special Needs Program    10/01/2020  Name: [name], DOB: [birth year]  MRN: [id number]   Ms. [name] is enrolled in a chronic special needs plan for Diabetes.  [facility] will continue to provide services for this client through 11/22/2020. The [employer] care management team will assume care 11/23/2020.  Peter Garter RN, [facility] [place], CDE Chronic Care Management Coordinator [facility] Management [phone]

## 2020-10-03 ENCOUNTER — Ambulatory Visit: Payer: HMO | Admitting: Podiatry

## 2020-10-03 ENCOUNTER — Encounter: Payer: Self-pay | Admitting: Podiatry

## 2020-10-03 ENCOUNTER — Other Ambulatory Visit: Payer: Self-pay

## 2020-10-03 DIAGNOSIS — L84 Corns and callosities: Secondary | ICD-10-CM | POA: Diagnosis not present

## 2020-10-03 DIAGNOSIS — M79675 Pain in left toe(s): Secondary | ICD-10-CM

## 2020-10-03 DIAGNOSIS — B351 Tinea unguium: Secondary | ICD-10-CM

## 2020-10-03 DIAGNOSIS — M79674 Pain in right toe(s): Secondary | ICD-10-CM | POA: Diagnosis not present

## 2020-10-04 ENCOUNTER — Ambulatory Visit: Payer: HMO | Admitting: Podiatry

## 2020-10-04 NOTE — Progress Notes (Signed)
Subjective:   Patient ID: [name], female   DOB: 74 y.o.   MRN: [id number]   HPI Patient presents with painful lesions on both feet plantar and presents with elongated nailbeds 1-5 both feet that are thick yellow brittle and she cannot take care of herself with pain   ROS      Objective:  Physical Exam  Chronic keratotic lesion submetatarsal bilateral with pain along with nail disease and elongation thick yellow brittle beds 1-5 both feet that are painful and she cannot cut     Assessment:  Chronic lesions of mycotic nail infection bilateral     Plan:  H&P education concerning condition rendered debrided nailbeds 1-5 both feet with no iatrogenic bleeding debrided lesions bilateral no iatrogenic bleeding and reappoint routine care

## 2020-10-23 DIAGNOSIS — E1122 Type 2 diabetes mellitus with diabetic chronic kidney disease: Secondary | ICD-10-CM | POA: Diagnosis not present

## 2020-10-30 DIAGNOSIS — E1122 Type 2 diabetes mellitus with diabetic chronic kidney disease: Secondary | ICD-10-CM | POA: Diagnosis not present

## 2020-10-30 DIAGNOSIS — I700 Atherosclerosis of aorta: Secondary | ICD-10-CM | POA: Diagnosis not present

## 2020-10-30 DIAGNOSIS — N182 Chronic kidney disease, stage 2 (mild): Secondary | ICD-10-CM | POA: Diagnosis not present

## 2020-10-30 DIAGNOSIS — I10 Essential (primary) hypertension: Secondary | ICD-10-CM | POA: Diagnosis not present

## 2020-10-30 DIAGNOSIS — M0609 Rheumatoid arthritis without rheumatoid factor, multiple sites: Secondary | ICD-10-CM | POA: Diagnosis not present

## 2020-10-30 DIAGNOSIS — E782 Mixed hyperlipidemia: Secondary | ICD-10-CM | POA: Diagnosis not present

## 2020-10-30 DIAGNOSIS — J449 Chronic obstructive pulmonary disease, unspecified: Secondary | ICD-10-CM | POA: Diagnosis not present

## 2020-11-11 DIAGNOSIS — H524 Presbyopia: Secondary | ICD-10-CM | POA: Diagnosis not present

## 2020-11-11 DIAGNOSIS — H04123 Dry eye syndrome of bilateral lacrimal glands: Secondary | ICD-10-CM | POA: Diagnosis not present

## 2020-11-29 ENCOUNTER — Other Ambulatory Visit: Payer: Self-pay

## 2020-12-02 DIAGNOSIS — M1711 Unilateral primary osteoarthritis, right knee: Secondary | ICD-10-CM | POA: Diagnosis not present

## 2020-12-11 DIAGNOSIS — Z1231 Encounter for screening mammogram for malignant neoplasm of breast: Secondary | ICD-10-CM | POA: Diagnosis not present

## 2021-01-13 ENCOUNTER — Encounter: Payer: Self-pay | Admitting: Podiatry

## 2021-01-13 ENCOUNTER — Other Ambulatory Visit: Payer: Self-pay

## 2021-01-13 ENCOUNTER — Ambulatory Visit (INDEPENDENT_AMBULATORY_CARE_PROVIDER_SITE_OTHER): Payer: HMO | Admitting: Podiatry

## 2021-01-13 DIAGNOSIS — M79675 Pain in left toe(s): Secondary | ICD-10-CM | POA: Diagnosis not present

## 2021-01-13 DIAGNOSIS — L84 Corns and callosities: Secondary | ICD-10-CM | POA: Diagnosis not present

## 2021-01-13 DIAGNOSIS — M79674 Pain in right toe(s): Secondary | ICD-10-CM | POA: Diagnosis not present

## 2021-01-13 DIAGNOSIS — E119 Type 2 diabetes mellitus without complications: Secondary | ICD-10-CM

## 2021-01-13 DIAGNOSIS — B351 Tinea unguium: Secondary | ICD-10-CM | POA: Diagnosis not present

## 2021-01-18 NOTE — Progress Notes (Addendum)
Subjective: [name] presents today for follow up of preventative diabetic foot care and painful mycotic nails b/l that are difficult to trim. Pain interferes with ambulation. Aggravating factors include wearing enclosed shoe gear. Pain is relieved with periodic professional debridement.   [name] states left 5th toe corn is painful and she hasn't had it trimmed in 6 months.    PCP is Dr. Merrilee Seashore.  Allergies  Allergen Reactions  . Ivp Dye [Iodinated Diagnostic Agents] Nausea And Vomiting  . Prednisone Other (See Comments)    reflux Other reaction(s): acid reflux     Objective: There were no vitals filed for this visit.  Pt is a pleasant 75 y.o. year old African American female  in NAD. AAO x 3.   Vascular Examination:  Capillary refill time to digits immediate b/l. Palpable DP pulses b/l. Palpable PT pulses b/l. Pedal hair sparse b/l. Skin temperature gradient within normal limits b/l.  Dermatological Examination: Pedal skin with normal turgor, texture and tone bilaterally. No open wounds bilaterally. No interdigital macerations bilaterally. Toenails 2-5 bilaterally elongated, discolored, dystrophic, thickened, and crumbly with subungual debris and tenderness to dorsal palpation. Anonychia noted L hallux and R hallux. Nailbed(s) epithelialized.  Hyperkeratotic lesion(s) L hallux and L 5th toe.  No erythema, no edema, no drainage, no fluctuance.  Musculoskeletal: Normal muscle strength 5/5 to all lower extremity muscle groups bilaterally, no gross bony deformities bilaterally and no pain crepitus or joint limitation noted with ROM b/l  Neurological: Protective sensation intact 5/5 intact bilaterally with 10g monofilament b/l. Vibratory sensation intact b/l. Babinski reflex negative b/l. Clonus negative b/l.  Assessment: 1. Pain due to onychomycosis of toenails of both feet   2. Corns and callosities   3. Controlled type 2 diabetes mellitus without complication,  without long-term current use of insulin ([place])      Plan: -Examined patient. -Continue diabetic foot care principles. -Toenails 1-5 b/l were debrided in length and girth with sterile nail nippers and dremel without iatrogenic bleeding.  -Corn(s) L 5th toe and callus(es) L hallux were pared utilizing sterile scalpel blade without incident. Total number debrided =2. -Patient to report any pedal injuries to medical professional immediately. -Patient/POA to call should there be question/concern in the interim.  Return in about 3 months (around 04/12/2021).

## 2021-01-27 DIAGNOSIS — M4644 Discitis, unspecified, thoracic region: Secondary | ICD-10-CM | POA: Diagnosis not present

## 2021-01-30 DIAGNOSIS — E782 Mixed hyperlipidemia: Secondary | ICD-10-CM | POA: Diagnosis not present

## 2021-01-30 DIAGNOSIS — N182 Chronic kidney disease, stage 2 (mild): Secondary | ICD-10-CM | POA: Diagnosis not present

## 2021-01-30 DIAGNOSIS — Z0000 Encounter for general adult medical examination without abnormal findings: Secondary | ICD-10-CM | POA: Diagnosis not present

## 2021-01-30 DIAGNOSIS — I10 Essential (primary) hypertension: Secondary | ICD-10-CM | POA: Diagnosis not present

## 2021-01-30 DIAGNOSIS — E669 Obesity, unspecified: Secondary | ICD-10-CM | POA: Diagnosis not present

## 2021-01-30 DIAGNOSIS — E1122 Type 2 diabetes mellitus with diabetic chronic kidney disease: Secondary | ICD-10-CM | POA: Diagnosis not present

## 2021-02-05 DIAGNOSIS — I10 Essential (primary) hypertension: Secondary | ICD-10-CM | POA: Diagnosis not present

## 2021-02-05 DIAGNOSIS — N182 Chronic kidney disease, stage 2 (mild): Secondary | ICD-10-CM | POA: Diagnosis not present

## 2021-02-05 DIAGNOSIS — M1990 Unspecified osteoarthritis, unspecified site: Secondary | ICD-10-CM | POA: Diagnosis not present

## 2021-02-05 DIAGNOSIS — J449 Chronic obstructive pulmonary disease, unspecified: Secondary | ICD-10-CM | POA: Diagnosis not present

## 2021-02-05 DIAGNOSIS — E782 Mixed hyperlipidemia: Secondary | ICD-10-CM | POA: Diagnosis not present

## 2021-02-05 DIAGNOSIS — Z79899 Other long term (current) drug therapy: Secondary | ICD-10-CM | POA: Diagnosis not present

## 2021-02-05 DIAGNOSIS — E1122 Type 2 diabetes mellitus with diabetic chronic kidney disease: Secondary | ICD-10-CM | POA: Diagnosis not present

## 2021-02-05 DIAGNOSIS — M0609 Rheumatoid arthritis without rheumatoid factor, multiple sites: Secondary | ICD-10-CM | POA: Diagnosis not present

## 2021-02-06 DIAGNOSIS — Z0000 Encounter for general adult medical examination without abnormal findings: Secondary | ICD-10-CM | POA: Diagnosis not present

## 2021-02-06 DIAGNOSIS — J432 Centrilobular emphysema: Secondary | ICD-10-CM | POA: Diagnosis not present

## 2021-02-06 DIAGNOSIS — I700 Atherosclerosis of aorta: Secondary | ICD-10-CM | POA: Diagnosis not present

## 2021-02-06 DIAGNOSIS — N182 Chronic kidney disease, stage 2 (mild): Secondary | ICD-10-CM | POA: Diagnosis not present

## 2021-02-06 DIAGNOSIS — I10 Essential (primary) hypertension: Secondary | ICD-10-CM | POA: Diagnosis not present

## 2021-02-06 DIAGNOSIS — E1122 Type 2 diabetes mellitus with diabetic chronic kidney disease: Secondary | ICD-10-CM | POA: Diagnosis not present

## 2021-02-06 DIAGNOSIS — M0609 Rheumatoid arthritis without rheumatoid factor, multiple sites: Secondary | ICD-10-CM | POA: Diagnosis not present

## 2021-02-06 DIAGNOSIS — J441 Chronic obstructive pulmonary disease with (acute) exacerbation: Secondary | ICD-10-CM | POA: Diagnosis not present

## 2021-02-06 DIAGNOSIS — E782 Mixed hyperlipidemia: Secondary | ICD-10-CM | POA: Diagnosis not present

## 2021-02-13 DIAGNOSIS — R059 Cough, unspecified: Secondary | ICD-10-CM | POA: Diagnosis not present

## 2021-02-13 DIAGNOSIS — I700 Atherosclerosis of aorta: Secondary | ICD-10-CM | POA: Diagnosis not present

## 2021-02-13 DIAGNOSIS — R0981 Nasal congestion: Secondary | ICD-10-CM | POA: Diagnosis not present

## 2021-02-13 DIAGNOSIS — J449 Chronic obstructive pulmonary disease, unspecified: Secondary | ICD-10-CM | POA: Diagnosis not present

## 2021-03-31 DIAGNOSIS — E1122 Type 2 diabetes mellitus with diabetic chronic kidney disease: Secondary | ICD-10-CM | POA: Diagnosis not present

## 2021-03-31 DIAGNOSIS — M25562 Pain in left knee: Secondary | ICD-10-CM | POA: Diagnosis not present

## 2021-03-31 DIAGNOSIS — N182 Chronic kidney disease, stage 2 (mild): Secondary | ICD-10-CM | POA: Diagnosis not present

## 2021-03-31 DIAGNOSIS — J449 Chronic obstructive pulmonary disease, unspecified: Secondary | ICD-10-CM | POA: Diagnosis not present

## 2021-03-31 DIAGNOSIS — M1990 Unspecified osteoarthritis, unspecified site: Secondary | ICD-10-CM | POA: Diagnosis not present

## 2021-03-31 DIAGNOSIS — M0609 Rheumatoid arthritis without rheumatoid factor, multiple sites: Secondary | ICD-10-CM | POA: Diagnosis not present

## 2021-03-31 DIAGNOSIS — I10 Essential (primary) hypertension: Secondary | ICD-10-CM | POA: Diagnosis not present

## 2021-03-31 DIAGNOSIS — E782 Mixed hyperlipidemia: Secondary | ICD-10-CM | POA: Diagnosis not present

## 2021-03-31 DIAGNOSIS — Z79899 Other long term (current) drug therapy: Secondary | ICD-10-CM | POA: Diagnosis not present

## 2021-03-31 DIAGNOSIS — M25561 Pain in right knee: Secondary | ICD-10-CM | POA: Diagnosis not present

## 2021-04-04 DIAGNOSIS — I10 Essential (primary) hypertension: Secondary | ICD-10-CM | POA: Diagnosis not present

## 2021-04-04 DIAGNOSIS — E1122 Type 2 diabetes mellitus with diabetic chronic kidney disease: Secondary | ICD-10-CM | POA: Diagnosis not present

## 2021-04-04 DIAGNOSIS — M1990 Unspecified osteoarthritis, unspecified site: Secondary | ICD-10-CM | POA: Diagnosis not present

## 2021-04-04 DIAGNOSIS — M25569 Pain in unspecified knee: Secondary | ICD-10-CM | POA: Diagnosis not present

## 2021-04-04 DIAGNOSIS — J449 Chronic obstructive pulmonary disease, unspecified: Secondary | ICD-10-CM | POA: Diagnosis not present

## 2021-04-04 DIAGNOSIS — N182 Chronic kidney disease, stage 2 (mild): Secondary | ICD-10-CM | POA: Diagnosis not present

## 2021-04-04 DIAGNOSIS — E782 Mixed hyperlipidemia: Secondary | ICD-10-CM | POA: Diagnosis not present

## 2021-04-04 DIAGNOSIS — Z79899 Other long term (current) drug therapy: Secondary | ICD-10-CM | POA: Diagnosis not present

## 2021-04-04 DIAGNOSIS — M0609 Rheumatoid arthritis without rheumatoid factor, multiple sites: Secondary | ICD-10-CM | POA: Diagnosis not present

## 2021-04-08 ENCOUNTER — Encounter: Payer: Self-pay | Admitting: Internal Medicine

## 2021-04-25 ENCOUNTER — Ambulatory Visit: Payer: HMO | Admitting: Podiatrist

## 2021-04-25 ENCOUNTER — Other Ambulatory Visit: Payer: Self-pay

## 2021-04-25 DIAGNOSIS — M79674 Pain in right toe(s): Secondary | ICD-10-CM

## 2021-04-25 DIAGNOSIS — M79675 Pain in left toe(s): Secondary | ICD-10-CM | POA: Diagnosis not present

## 2021-04-25 DIAGNOSIS — E119 Type 2 diabetes mellitus without complications: Secondary | ICD-10-CM

## 2021-04-25 DIAGNOSIS — B351 Tinea unguium: Secondary | ICD-10-CM

## 2021-04-25 DIAGNOSIS — L84 Corns and callosities: Secondary | ICD-10-CM

## 2021-04-29 ENCOUNTER — Encounter: Payer: Self-pay | Admitting: Podiatrist

## 2021-04-29 NOTE — Progress Notes (Signed)
Subjective: [name]  is a pleasant 75 year old female who presents today for follow up of preventative diabetic foot care and painful mycotic nails b/l that are difficult to trim. Pain interferes with ambulation. Aggravating factors include wearing enclosed shoe gear. Pain is relieved with periodic professional debridement.    [name] states left 5th toe corn is painful    PCP is Dr. Merrilee Seashore. Allergies  Allergen Reactions  . Ivp Dye [Iodinated Diagnostic Agents] Nausea And Vomiting  . Prednisone Other (See Comments)      reflux Other reaction(s): acid reflux      Objective:    Vascular Examination:  Capillary refill time to digits immediate b/l. Palpable DP pulses b/l. Palpable PT pulses b/l. Pedal hair sparse b/l. Skin temperature gradient within normal limits b/l.   Dermatological Examination: Pedal skin with normal turgor, texture and tone bilaterally. No open wounds bilaterally. No interdigital macerations bilaterally. Toenails 2-5 bilaterally elongated, discolored, dystrophic, thickened, and crumbly with subungual debris and tenderness to dorsal palpation. Anonychia noted L hallux and R hallux. Nailbed(s) epithelialized.  Hyperkeratotic lesion(s) L hallux and L 5th toe.  No erythema, no edema, no drainage, no fluctuance.   Musculoskeletal: Normal muscle strength 5/5 to all lower extremity muscle groups bilaterally, no gross bony deformities bilaterally and no pain crepitus or joint limitation noted with ROM b/l   Neurological: Protective sensation intact 5/5 intact bilaterally with 10g monofilament b/l. Vibratory sensation intact b/l. Babinski reflex negative b/l. Clonus negative b/l.   Assessment: 1. Pain due to onychomycosis of toenails of both feet   2. Corns and callosities   3. Controlled type 2 diabetes mellitus without complication, without long-term current use of insulin ([facility])       Plan: -Examined patient. -Toenails 1-5 b/l were debrided in  length and girth with sterile nail nippers and dremel without iatrogenic bleeding.  -Corn(s) L 5th toe was pared utilizing sterile 15  blade without incident. Total number debrided =1. -Patient to report any pedal injuries to medical professional immediately. -Patient/POA to call should there be question/concern in the interim.

## 2021-05-07 DIAGNOSIS — M5416 Radiculopathy, lumbar region: Secondary | ICD-10-CM | POA: Diagnosis not present

## 2021-05-07 DIAGNOSIS — Z79899 Other long term (current) drug therapy: Secondary | ICD-10-CM | POA: Diagnosis not present

## 2021-05-07 DIAGNOSIS — M79643 Pain in unspecified hand: Secondary | ICD-10-CM | POA: Diagnosis not present

## 2021-05-07 DIAGNOSIS — M1990 Unspecified osteoarthritis, unspecified site: Secondary | ICD-10-CM | POA: Diagnosis not present

## 2021-05-07 DIAGNOSIS — M25569 Pain in unspecified knee: Secondary | ICD-10-CM | POA: Diagnosis not present

## 2021-05-07 DIAGNOSIS — M0609 Rheumatoid arthritis without rheumatoid factor, multiple sites: Secondary | ICD-10-CM | POA: Diagnosis not present

## 2021-07-23 DIAGNOSIS — K219 Gastro-esophageal reflux disease without esophagitis: Secondary | ICD-10-CM | POA: Diagnosis not present

## 2021-07-23 DIAGNOSIS — N182 Chronic kidney disease, stage 2 (mild): Secondary | ICD-10-CM | POA: Diagnosis not present

## 2021-07-23 DIAGNOSIS — E782 Mixed hyperlipidemia: Secondary | ICD-10-CM | POA: Diagnosis not present

## 2021-07-23 DIAGNOSIS — I129 Hypertensive chronic kidney disease with stage 1 through stage 4 chronic kidney disease, or unspecified chronic kidney disease: Secondary | ICD-10-CM | POA: Diagnosis not present

## 2021-08-04 ENCOUNTER — Encounter: Payer: Self-pay | Admitting: Podiatry

## 2021-08-04 ENCOUNTER — Other Ambulatory Visit: Payer: Self-pay

## 2021-08-04 ENCOUNTER — Ambulatory Visit: Payer: HMO | Admitting: Podiatry

## 2021-08-04 DIAGNOSIS — Q828 Other specified congenital malformations of skin: Secondary | ICD-10-CM

## 2021-08-04 DIAGNOSIS — E119 Type 2 diabetes mellitus without complications: Secondary | ICD-10-CM | POA: Diagnosis not present

## 2021-08-04 DIAGNOSIS — M79674 Pain in right toe(s): Secondary | ICD-10-CM

## 2021-08-04 DIAGNOSIS — L84 Corns and callosities: Secondary | ICD-10-CM

## 2021-08-04 DIAGNOSIS — B351 Tinea unguium: Secondary | ICD-10-CM

## 2021-08-04 DIAGNOSIS — M79675 Pain in left toe(s): Secondary | ICD-10-CM | POA: Diagnosis not present

## 2021-08-07 NOTE — Progress Notes (Signed)
Subjective: [name] is a pleasant 75 y.o. female patient seen today for preventative diabetic foot care, callus(es) b/l feet. Aggravating factors include weightbearing with and without shoe gear. Pain is relieved with periodic professional debridement., and painful porokeratotic lesion(s) left 5th toe and painful mycotic toenails that limit ambulation. Painful toenails interfere with ambulation. Aggravating factors include wearing enclosed shoe gear. Pain is relieved with periodic professional debridement. Painful porokeratotic lesions are aggravated when weightbearing with and without shoegear. Pain is relieved with periodic professional debridement.   Patient states their blood glucose was 97 mg/dl one week ago. Patient did not check blood glucose on today.  PCP is Merrilee Seashore, MD. Last visit was: 06/23/2021.  Allergies  Allergen Reactions   Ivp Dye [Iodinated Diagnostic Agents] Nausea And Vomiting   Prednisone Other (See Comments)    reflux Other reaction(s): acid reflux Other reaction(s): acid reflux    Objective: Physical Exam  General: [name] is a pleasant 75 y.o. African American female, in NAD. AAO x 3.   Vascular:  Capillary refill time to digits immediate b/l. Palpable pedal pulses b/l LE. Pedal hair sparse. Lower extremity skin temperature gradient within normal limits. No pain with calf compression b/l. No edema noted b/l lower extremities.  Dermatological:  Skin warm and supple b/l lower extremities. No open wounds b/l lower extremities. No interdigital macerations b/l lower extremities. Toenails 2-5 bilaterally severely elongated, thickened with discoloration, also possessing excessive curvature and impinging onto pedal the distal tips of the digits. No erythema, no edema, no drainage, no fluctuance. Anonychia noted L hallux and R hallux. Nailbed(s) epithelialized.  Hyperkeratotic lesion(s) L hallux and 1st metatarsal head b/l lower extremities.  No  erythema, no edema, no drainage, no fluctuance. Porokeratotic lesion(s) L 5th toe. No erythema, no edema, no drainage, no fluctuance.  Musculoskeletal:  Normal muscle strength 5/5 to all lower extremity muscle groups bilaterally. No pain crepitus or joint limitation noted with ROM b/l lower extremities. No gross bony deformities b/l lower extremities.  Neurological:  Protective sensation intact 5/5 intact bilaterally with 10g monofilament b/l. Vibratory sensation intact b/l.  Assessment and Plan:  1. Pain due to onychomycosis of toenails of both feet   2. Callus   3. Porokeratosis   4. Controlled type 2 diabetes mellitus without complication, without long-term current use of insulin ([place])    -Examined patient. -Continue diabetic foot care principles: inspect feet daily, monitor glucose as recommended by PCP and/or Endocrinologist, and follow prescribed diet per PCP, Endocrinologist and/or dietician. -Patient to continue soft, supportive shoe gear daily. -Toenails 2-5 bilaterally debrided in length and girth without iatrogenic bleeding with sterile nail nipper and dremel.  -Callus(es) L hallux and 1st metatarsal head b/l lower extremities pared utilizing sterile scalpel blade without complication or incident. Total number debrided =3. -Painful porokeratotic lesion(s) L 5th toe pared and enucleated with sterile scalpel blade without incident. Total number of lesions debrided=1. -Patient to report any pedal injuries to medical professional immediately. -Patient/POA to call should there be question/concern in the interim.  Return in about 3 months (around 11/03/2021).  Marzetta Board, DPM

## 2021-08-22 DIAGNOSIS — E782 Mixed hyperlipidemia: Secondary | ICD-10-CM | POA: Diagnosis not present

## 2021-08-22 DIAGNOSIS — N182 Chronic kidney disease, stage 2 (mild): Secondary | ICD-10-CM | POA: Diagnosis not present

## 2021-08-22 DIAGNOSIS — K219 Gastro-esophageal reflux disease without esophagitis: Secondary | ICD-10-CM | POA: Diagnosis not present

## 2021-08-22 DIAGNOSIS — I129 Hypertensive chronic kidney disease with stage 1 through stage 4 chronic kidney disease, or unspecified chronic kidney disease: Secondary | ICD-10-CM | POA: Diagnosis not present

## 2021-08-25 DIAGNOSIS — Z79899 Other long term (current) drug therapy: Secondary | ICD-10-CM | POA: Diagnosis not present

## 2021-08-25 DIAGNOSIS — M21969 Unspecified acquired deformity of unspecified lower leg: Secondary | ICD-10-CM | POA: Diagnosis not present

## 2021-08-25 DIAGNOSIS — M5416 Radiculopathy, lumbar region: Secondary | ICD-10-CM | POA: Diagnosis not present

## 2021-08-25 DIAGNOSIS — M0609 Rheumatoid arthritis without rheumatoid factor, multiple sites: Secondary | ICD-10-CM | POA: Diagnosis not present

## 2021-08-25 DIAGNOSIS — M25569 Pain in unspecified knee: Secondary | ICD-10-CM | POA: Diagnosis not present

## 2021-08-25 DIAGNOSIS — M79643 Pain in unspecified hand: Secondary | ICD-10-CM | POA: Diagnosis not present

## 2021-08-25 DIAGNOSIS — M1990 Unspecified osteoarthritis, unspecified site: Secondary | ICD-10-CM | POA: Diagnosis not present

## 2021-08-27 DIAGNOSIS — J432 Centrilobular emphysema: Secondary | ICD-10-CM | POA: Diagnosis not present

## 2021-08-27 DIAGNOSIS — I10 Essential (primary) hypertension: Secondary | ICD-10-CM | POA: Diagnosis not present

## 2021-08-27 DIAGNOSIS — E1142 Type 2 diabetes mellitus with diabetic polyneuropathy: Secondary | ICD-10-CM | POA: Diagnosis not present

## 2021-08-27 DIAGNOSIS — Z23 Encounter for immunization: Secondary | ICD-10-CM | POA: Diagnosis not present

## 2021-08-27 DIAGNOSIS — N182 Chronic kidney disease, stage 2 (mild): Secondary | ICD-10-CM | POA: Diagnosis not present

## 2021-08-27 DIAGNOSIS — E782 Mixed hyperlipidemia: Secondary | ICD-10-CM | POA: Diagnosis not present

## 2021-09-29 DIAGNOSIS — R052 Subacute cough: Secondary | ICD-10-CM | POA: Diagnosis not present

## 2021-09-29 DIAGNOSIS — J4541 Moderate persistent asthma with (acute) exacerbation: Secondary | ICD-10-CM | POA: Diagnosis not present

## 2021-10-13 DIAGNOSIS — J432 Centrilobular emphysema: Secondary | ICD-10-CM | POA: Diagnosis not present

## 2021-10-13 DIAGNOSIS — J441 Chronic obstructive pulmonary disease with (acute) exacerbation: Secondary | ICD-10-CM | POA: Diagnosis not present

## 2021-10-22 DIAGNOSIS — K219 Gastro-esophageal reflux disease without esophagitis: Secondary | ICD-10-CM | POA: Diagnosis not present

## 2021-10-22 DIAGNOSIS — I129 Hypertensive chronic kidney disease with stage 1 through stage 4 chronic kidney disease, or unspecified chronic kidney disease: Secondary | ICD-10-CM | POA: Diagnosis not present

## 2021-10-22 DIAGNOSIS — N182 Chronic kidney disease, stage 2 (mild): Secondary | ICD-10-CM | POA: Diagnosis not present

## 2021-10-22 DIAGNOSIS — E782 Mixed hyperlipidemia: Secondary | ICD-10-CM | POA: Diagnosis not present

## 2021-11-04 ENCOUNTER — Other Ambulatory Visit: Payer: Self-pay

## 2021-11-04 ENCOUNTER — Ambulatory Visit: Payer: HMO | Admitting: Podiatry

## 2021-11-04 ENCOUNTER — Encounter: Payer: Self-pay | Admitting: Podiatry

## 2021-11-04 DIAGNOSIS — M79674 Pain in right toe(s): Secondary | ICD-10-CM | POA: Diagnosis not present

## 2021-11-04 DIAGNOSIS — B351 Tinea unguium: Secondary | ICD-10-CM

## 2021-11-04 DIAGNOSIS — E119 Type 2 diabetes mellitus without complications: Secondary | ICD-10-CM | POA: Diagnosis not present

## 2021-11-04 DIAGNOSIS — J432 Centrilobular emphysema: Secondary | ICD-10-CM | POA: Insufficient documentation

## 2021-11-04 DIAGNOSIS — M79675 Pain in left toe(s): Secondary | ICD-10-CM | POA: Diagnosis not present

## 2021-11-04 DIAGNOSIS — N182 Chronic kidney disease, stage 2 (mild): Secondary | ICD-10-CM | POA: Insufficient documentation

## 2021-11-04 DIAGNOSIS — L84 Corns and callosities: Secondary | ICD-10-CM

## 2021-11-04 DIAGNOSIS — Q828 Other specified congenital malformations of skin: Secondary | ICD-10-CM | POA: Diagnosis not present

## 2021-11-04 DIAGNOSIS — J441 Chronic obstructive pulmonary disease with (acute) exacerbation: Secondary | ICD-10-CM | POA: Insufficient documentation

## 2021-11-04 NOTE — Progress Notes (Signed)
ANNUAL DIABETIC FOOT EXAM  Subjective: [name] presents today for for annual diabetic foot examination, callus(es) b/l and painful thick toenails that are difficult to trim. Painful toenails interfere with ambulation. Aggravating factors include wearing enclosed shoe gear. Pain is relieved with periodic professional debridement. Painful calluses are aggravated when weightbearing with and without shoegear. Pain is relieved with periodic professional debridement. She also has porokeratotic lesions left 5th digit. Pain prevents comfortable ambulation. Aggravating factor is weightbearing with or without shoegear..  Patient relates 15 year h/o diabetes.  Patient denies any h/o foot wounds.  Patient relates symptoms of numbness of left foot.  Patient's blood sugar was 110 mg/dl today.  Anne Seashore, MD is patient's PCP. Last visit was October, 2022.  Past Medical History:  Diagnosis Date   Arthritis    "knees, ankles, fingers" (07/06/2018)   Asthma    Chronic lower back pain    Dyspnea    with asthma attacks    Dysrhythmia    Family history of adverse reaction to anesthesia    my first cousin had difficulty waking up    Fibroid    ovarian   GERD (gastroesophageal reflux disease)    Hip osteoarthritis    Left   History of staph infection    in hospital for 11 days/ in 2007   Hyperlipidemia    Hypertension    Radiculopathy    Type II diabetes mellitus ([place])    Wears glasses    Patient Active Problem List   Diagnosis Date Noted   Centrilobular emphysema ([place]) 11/04/2021   Chronic kidney disease, stage 2 (mild) 11/04/2021   Acute exacerbation of chronic obstructive airways disease ([place]) 11/04/2021   Primary osteoarthritis of left hip 07/06/2018   Osteoarthritis of left hip 05/18/2018   Osteoarthritis of right hip 05/17/2018   Lumbar stenosis with neurogenic claudication 01/31/2018   Elevated LFTs 12/01/2017   HTN (hypertension) 09/03/2016   Diabetes ([place])  09/03/2016   Radiculopathy 12/11/2015   Past Surgical History:  Procedure Laterality Date   ANTERIOR CERVICAL DECOMP/DISCECTOMY FUSION N/A 12/11/2015   Procedure: ANTERIOR CERVICAL DECOMPRESSION/DISCECTOMY FUSION 2 LEVELS;  Surgeon: Phylliss Bob, MD;  Location: [facility];  Service: Orthopedics;  Laterality: N/A;  Anterior cervical decompression fusion, cervical 6-7, cervical 7-thoracic 1 with instrumentation and allograft   BACK SURGERY     CARDIAC CATHETERIZATION  1998   CATARACT EXTRACTION W/ INTRAOCULAR LENS  IMPLANT, BILATERAL Bilateral 2015   [place]il   COLONOSCOPY     CRYOABLATION  1988   "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"   INCISION AND DRAINAGE Bilateral 2007>   "cleaned staph out of shoulders"   [place] LAMINECTOMY/DECOMPRESSION MICRODISCECTOMY N/A 01/31/2018   Procedure: LAMINECTOMY AND FORAMINOTOMY LUMBAR TWO- LUMBAR THREE, LUMBAR THREE- LUMBAR FOUR, LUMBAR FOUR- LUMBAR FIVE ;  Surgeon: Newman Pies, MD;  Location: [facility];  Service: Neurosurgery;  Laterality: N/A;   POSTERIOR CERVICAL LAMINECTOMY  ~ [place] Right 02/2003   THUMB FUSION Right 2010   right thumb   TONSILLECTOMY     TOTAL ABDOMINAL HYSTERECTOMY  1998   TAH,BSO   TOTAL HIP ARTHROPLASTY Right 05/18/2018   Procedure: TOTAL HIP ARTHROPLASTY ANTERIOR APPROACH;  Surgeon: Frederik Pear, MD;  Location: [facility];  Service: Orthopedics;  Laterality: Right;   TOTAL HIP ARTHROPLASTY Left 07/06/2018   TOTAL HIP ARTHROPLASTY Left 07/06/2018   Procedure: LEFT  TOTAL HIP ARTHROPLASTY ANTERIOR APPROACH;  Surgeon: Frederik Pear, MD;  Location: [facility];  Service: Orthopedics;  Laterality: Left;  Needs RNFA   TUBAL LIGATION     Current Outpatient Medications on File Prior to Visit  Medication Sig Dispense Refill   albuterol (ACCUNEB) 1.25 MG/3ML nebulizer solution 3 ml as needed     diltiazem (CARTIA XT) 180 MG 24 hr capsule 1  capsule     enalapril (VASOTEC) 20 MG tablet 1 tablet     etanercept (ENBREL SURECLICK) 50 MG/ML injection See admin instructions.     gabapentin (NEURONTIN) 300 MG capsule Take 1 capsule by mouth daily.     hydrOXYzine (ATARAX) 10 MG tablet take 1 to 2 tablets     metFORMIN (GLUCOPHAGE-XR) 500 MG 24 hr tablet 1 tablet     acetaminophen (TYLENOL) 500 MG tablet Take 1,000 mg by mouth daily as needed for moderate pain or headache.     amoxicillin (AMOXIL) 500 MG capsule TAKE 4 CAPSULES BY MOUTH FOR ONE DOSE THEN TAKE 4 CAPSULES BY MOUTH PRIOR TO DENTAL PROCEDURE     benzonatate (TESSALON) 200 MG capsule      betamethasone dipropionate 0.05 % cream      Blood Glucose Monitoring Suppl (ONE TOUCH ULTRA 2) w/Device KIT Check blood sugar twice per week     brompheniramine-pseudoephedrine-DM 30-2-10 MG/5ML syrup Take 5 mLs by mouth 3 (three) times daily as needed.     bumetanide (BUMEX) 1 MG tablet      cephALEXin (KEFLEX) 500 MG capsule Take 500 mg by mouth 3 (three) times daily.     cetirizine (ZYRTEC ALLERGY) 10 MG tablet      cetirizine (ZYRTEC) 10 MG tablet Take 10 mg by mouth daily as needed for allergies (during Spring/Summer).     chlorhexidine (PERIDEX) 0.12 % solution SMARTSIG:By Mouth     cholecalciferol (VITAMIN D) 1000 units tablet Take 1,000 Units by mouth daily.     cholecalciferol (VITAMIN D3) 25 MCG (1000 UNIT) tablet 1 capsule     cholecalciferol (VITAMIN D3) 25 MCG (1000 UNIT) tablet      clindamycin (CLEOCIN) 300 MG capsule SMARTSIG:2 Capsule(s) By Mouth     diclofenac (VOLTAREN) 75 MG EC tablet Take 1 tablet by mouth 2 (two) times daily.     docusate sodium (COLACE) 100 MG capsule Take 100 mg by mouth every other day.     enalapril (VASOTEC) 20 MG tablet Take 20 mg by mouth at bedtime.      fluorometholone (FML) 0.1 % ophthalmic suspension SMARTSIG:In Eye(s)     Fluticasone-Salmeterol (ADVAIR DISKUS IN)      Fluticasone-Salmeterol (ADVAIR DISKUS) 250-50 MCG/DOSE AEPB 1 puff      folic acid (FOLVITE) 300 MCG tablet      gabapentin (NEURONTIN) 300 MG capsule Take 300 mg by mouth daily.      glucose blood test strip      hydrOXYzine (ATARAX/VISTARIL) 10 MG tablet Take 20 mg by mouth 2 (two) times daily.     hydrOXYzine (ATARAX/VISTARIL) 25 MG tablet      Lancets (ONETOUCH DELICA PLUS [id number]) MISC Use to check blood sugar     lovastatin (MEVACOR) 40 MG tablet Take 40 mg by mouth at bedtime.      meclizine (ANTIVERT) 12.5 MG tablet      metFORMIN (GLUCOPHAGE-XR) 500 MG 24 hr tablet Take 500-1,000 mg by mouth See admin instructions. Take 1 tablet (500 mg) in the morning and take 2 tablets (1000 mg) with  supper     metFORMIN (GLUCOPHAGE-XR) 500 MG 24 hr tablet      methotrexate (RHEUMATREX) 2.5 MG tablet Take 12.5 mg by mouth every Sunday at [place]. Protect from light.      Multiple Vitamin (MULTI-VITAMIN DAILY PO) 1 tablet     Multiple Vitamin (MULTI-VITAMIN PO)      Multiple Vitamin (MULTIVITAMIN WITH MINERALS) TABS tablet Take 1 tablet by mouth daily.     omeprazole (PRILOSEC OTC) 20 MG tablet 1 tablet 30 minutes before morning meal     ONETOUCH ULTRA test strip      oxyCODONE-acetaminophen (PERCOCET/ROXICET) 5-325 MG tablet Take 1 tablet by mouth every 4 (four) hours as needed.     sodium chloride (OCEAN) 0.65 % SOLN nasal spray Place 1-2 sprays into the nose 4 (four) times daily as needed for congestion.     TRELEGY ELLIPTA 200-62.5-25 MCG/ACT AEPB Take 1 puff by mouth daily.     Turmeric Curcumin 500 MG CAPS See admin instructions.     umeclidinium-vilanterol (ANORO ELLIPTA) 62.5-25 MCG/INH AEPB      No current facility-administered medications on file prior to visit.    Allergies  Allergen Reactions   Ivp Dye [Iodinated Diagnostic Agents] Nausea And Vomiting   Prednisone Other (See Comments)    reflux Other reaction(s): acid reflux Other reaction(s): acid reflux Other reaction(s): acid reflux   Social History   Occupational History    Not on file  Tobacco Use   Smoking status: Former    Packs/day: 1.00    Years: 25.00    Pack years: 25.00    Types: Cigarettes    Quit date: 11/23/1992    Years since quitting: 28.9   Smokeless tobacco: Never  Vaping Use   Vaping Use: Never used  Substance and Sexual Activity   Alcohol use: Not Currently   Drug use: Never   Sexual activity: Not Currently   Family History  Problem Relation Age of Onset   Diabetes Sister    Diabetes Brother    Diabetes Brother    Diabetes Paternal Uncle    Cancer Paternal Aunt        UTERINE   Hypertension Maternal Grandmother    Heart disease Maternal Grandmother    Colon cancer Neg Hx    Immunization History  Administered Date(s) Administered   Influenza-Unspecified 07/24/2014     Review of Systems: Negative except as noted in the HPI.   Objective: There were no vitals filed for this visit.  [name] is a pleasant 75 y.o. female in NAD. AAO X 3.  Vascular Examination: CFT immediate b/l LE. Palpable DP/PT pulses b/l LE. Digital hair sparse b/l. Skin temperature gradient WNL b/l. No pain with calf compression b/l. No edema noted b/l. No cyanosis or clubbing noted b/l LE.  Dermatological Examination: Pedal skin is warm and supple b/l LE. No open wounds b/l LE. No interdigital macerations noted b/l LE. Anonychia noted bilateral great toes. Nailbed(s) epithelialized.  Toenails 2-5 bilaterally severely elongated, thickened with discoloration, also possessing excessive curvature and impinging onto the distal tips of the digits. No erythema, no edema, no drainage, no fluctuance. Hyperkeratotic lesion(s) L hallux and 1st metatarsal head b/l lower extremities.  No erythema, no edema, no drainage, no fluctuance. Porokeratotic lesion(s) L 5th toe. No erythema, no edema, no drainage, no fluctuance.  Musculoskeletal Examination: Muscle strength 5/5 to all lower extremity muscle groups bilaterally. No pain, crepitus or joint limitation noted  with ROM bilateral LE. No gross  bony deformities bilaterally.  Footwear Assessment: Does the patient wear appropriate shoes? Yes. Does the patient need inserts/orthotics? No..  Neurological Examination: Protective sensation intact 5/5 intact bilaterally with 10g monofilament b/l. Vibratory sensation intact b/l.  Assessment: 1. Pain due to onychomycosis of toenails of both feet   2. Callus   3. Porokeratosis   4. Controlled type 2 diabetes mellitus without complication, without long-term current use of insulin ([place])   5. Encounter for diabetic foot exam ([place])     ADA Risk Categorization: Low Risk :  Patient has all of the following: Intact protective sensation No prior foot ulcer  No severe deformity Pedal pulses present  Plan: -Diabetic foot examination performed today. -Continue foot and shoe inspections daily. Monitor blood glucose per PCP/Endocrinologist's recommendations. -Mycotic toenails 2-5 bilaterally were debrided in length and girth with sterile nail nippers and dremel without iatrogenic bleeding. -Callus(es) L hallux and 1st metatarsal head b/l lower extremities pared utilizing sterile scalpel blade without complication or incident. Total number debrided =3. -Painful porokeratotic lesion(s) L 5th toe pared and enucleated with sterile scalpel blade without incident. Total number of lesions debrided=1. -Patient/POA to call should there be question/concern in the interim.  Return in about 3 months (around 02/02/2022).  Marzetta Board, DPM

## 2021-11-26 DIAGNOSIS — E782 Mixed hyperlipidemia: Secondary | ICD-10-CM | POA: Diagnosis not present

## 2021-11-26 DIAGNOSIS — E1142 Type 2 diabetes mellitus with diabetic polyneuropathy: Secondary | ICD-10-CM | POA: Diagnosis not present

## 2021-11-26 DIAGNOSIS — I700 Atherosclerosis of aorta: Secondary | ICD-10-CM | POA: Diagnosis not present

## 2021-11-26 DIAGNOSIS — J432 Centrilobular emphysema: Secondary | ICD-10-CM | POA: Diagnosis not present

## 2021-11-26 DIAGNOSIS — N182 Chronic kidney disease, stage 2 (mild): Secondary | ICD-10-CM | POA: Diagnosis not present

## 2021-12-15 DIAGNOSIS — H40013 Open angle with borderline findings, low risk, bilateral: Secondary | ICD-10-CM | POA: Diagnosis not present

## 2021-12-15 DIAGNOSIS — E119 Type 2 diabetes mellitus without complications: Secondary | ICD-10-CM | POA: Diagnosis not present

## 2021-12-15 DIAGNOSIS — H04123 Dry eye syndrome of bilateral lacrimal glands: Secondary | ICD-10-CM | POA: Diagnosis not present

## 2021-12-15 DIAGNOSIS — Z961 Presence of intraocular lens: Secondary | ICD-10-CM | POA: Diagnosis not present

## 2021-12-17 DIAGNOSIS — Z1231 Encounter for screening mammogram for malignant neoplasm of breast: Secondary | ICD-10-CM | POA: Diagnosis not present

## 2021-12-30 DIAGNOSIS — Z79899 Other long term (current) drug therapy: Secondary | ICD-10-CM | POA: Diagnosis not present

## 2021-12-30 DIAGNOSIS — M0609 Rheumatoid arthritis without rheumatoid factor, multiple sites: Secondary | ICD-10-CM | POA: Diagnosis not present

## 2021-12-30 DIAGNOSIS — M79643 Pain in unspecified hand: Secondary | ICD-10-CM | POA: Diagnosis not present

## 2021-12-30 DIAGNOSIS — M1990 Unspecified osteoarthritis, unspecified site: Secondary | ICD-10-CM | POA: Diagnosis not present

## 2021-12-30 DIAGNOSIS — M7989 Other specified soft tissue disorders: Secondary | ICD-10-CM | POA: Diagnosis not present

## 2022-01-25 ENCOUNTER — Emergency Department (HOSPITAL_COMMUNITY): Payer: HMO

## 2022-01-25 ENCOUNTER — Other Ambulatory Visit: Payer: Self-pay

## 2022-01-25 ENCOUNTER — Encounter: Payer: Self-pay | Admitting: Emergency Medicine

## 2022-01-25 ENCOUNTER — Emergency Department (HOSPITAL_COMMUNITY)
Admission: EM | Admit: 2022-01-25 | Discharge: 2022-01-25 | Disposition: A | Discharge status: Home / Self Care | Payer: HMO | Source: Home / Self Care | Attending: Emergency Medicine | Admitting: Emergency Medicine

## 2022-01-25 ENCOUNTER — Ambulatory Visit
Admission: EM | Admit: 2022-01-25 | Discharge: 2022-01-25 | Disposition: A | Discharge status: Other Acute Inpatient Hospital | Payer: HMO | Attending: Physician Assistant | Admitting: Physician Assistant

## 2022-01-25 DIAGNOSIS — E785 Hyperlipidemia, unspecified: Secondary | ICD-10-CM | POA: Diagnosis present

## 2022-01-25 DIAGNOSIS — R509 Fever, unspecified: Secondary | ICD-10-CM | POA: Diagnosis not present

## 2022-01-25 DIAGNOSIS — M4624 Osteomyelitis of vertebra, thoracic region: Secondary | ICD-10-CM | POA: Diagnosis present

## 2022-01-25 DIAGNOSIS — R338 Other retention of urine: Secondary | ICD-10-CM | POA: Diagnosis not present

## 2022-01-25 DIAGNOSIS — E1169 Type 2 diabetes mellitus with other specified complication: Secondary | ICD-10-CM | POA: Diagnosis present

## 2022-01-25 DIAGNOSIS — D72829 Elevated white blood cell count, unspecified: Secondary | ICD-10-CM | POA: Insufficient documentation

## 2022-01-25 DIAGNOSIS — Z6833 Body mass index (BMI) 33.0-33.9, adult: Secondary | ICD-10-CM | POA: Diagnosis not present

## 2022-01-25 DIAGNOSIS — A4101 Sepsis due to Methicillin susceptible Staphylococcus aureus: Secondary | ICD-10-CM | POA: Diagnosis not present

## 2022-01-25 DIAGNOSIS — Z8619 Personal history of other infectious and parasitic diseases: Secondary | ICD-10-CM | POA: Diagnosis not present

## 2022-01-25 DIAGNOSIS — M4726 Other spondylosis with radiculopathy, lumbar region: Secondary | ICD-10-CM | POA: Diagnosis not present

## 2022-01-25 DIAGNOSIS — R531 Weakness: Secondary | ICD-10-CM | POA: Diagnosis not present

## 2022-01-25 DIAGNOSIS — R1084 Generalized abdominal pain: Secondary | ICD-10-CM | POA: Diagnosis not present

## 2022-01-25 DIAGNOSIS — M868X8 Other osteomyelitis, other site: Secondary | ICD-10-CM | POA: Diagnosis not present

## 2022-01-25 DIAGNOSIS — M546 Pain in thoracic spine: Secondary | ICD-10-CM

## 2022-01-25 DIAGNOSIS — Z20822 Contact with and (suspected) exposure to covid-19: Secondary | ICD-10-CM | POA: Diagnosis present

## 2022-01-25 DIAGNOSIS — R079 Chest pain, unspecified: Secondary | ICD-10-CM | POA: Diagnosis not present

## 2022-01-25 DIAGNOSIS — M069 Rheumatoid arthritis, unspecified: Secondary | ICD-10-CM | POA: Diagnosis present

## 2022-01-25 DIAGNOSIS — G9340 Encephalopathy, unspecified: Secondary | ICD-10-CM | POA: Diagnosis not present

## 2022-01-25 DIAGNOSIS — G061 Intraspinal abscess and granuloma: Secondary | ICD-10-CM | POA: Diagnosis present

## 2022-01-25 DIAGNOSIS — Z0389 Encounter for observation for other suspected diseases and conditions ruled out: Secondary | ICD-10-CM | POA: Diagnosis not present

## 2022-01-25 DIAGNOSIS — R0602 Shortness of breath: Secondary | ICD-10-CM | POA: Diagnosis not present

## 2022-01-25 DIAGNOSIS — M48061 Spinal stenosis, lumbar region without neurogenic claudication: Secondary | ICD-10-CM | POA: Diagnosis present

## 2022-01-25 DIAGNOSIS — Z79899 Other long term (current) drug therapy: Secondary | ICD-10-CM | POA: Diagnosis not present

## 2022-01-25 DIAGNOSIS — R0989 Other specified symptoms and signs involving the circulatory and respiratory systems: Secondary | ICD-10-CM

## 2022-01-25 DIAGNOSIS — M549 Dorsalgia, unspecified: Secondary | ICD-10-CM | POA: Diagnosis not present

## 2022-01-25 DIAGNOSIS — X501XXA Overexertion from prolonged static or awkward postures, initial encounter: Secondary | ICD-10-CM | POA: Insufficient documentation

## 2022-01-25 DIAGNOSIS — Z87891 Personal history of nicotine dependence: Secondary | ICD-10-CM | POA: Diagnosis not present

## 2022-01-25 DIAGNOSIS — Z96643 Presence of artificial hip joint, bilateral: Secondary | ICD-10-CM | POA: Diagnosis present

## 2022-01-25 DIAGNOSIS — D849 Immunodeficiency, unspecified: Secondary | ICD-10-CM | POA: Diagnosis not present

## 2022-01-25 DIAGNOSIS — J432 Centrilobular emphysema: Secondary | ICD-10-CM | POA: Diagnosis present

## 2022-01-25 DIAGNOSIS — G062 Extradural and subdural abscess, unspecified: Secondary | ICD-10-CM | POA: Diagnosis present

## 2022-01-25 DIAGNOSIS — G8929 Other chronic pain: Secondary | ICD-10-CM | POA: Diagnosis present

## 2022-01-25 DIAGNOSIS — M5116 Intervertebral disc disorders with radiculopathy, lumbar region: Secondary | ICD-10-CM | POA: Diagnosis not present

## 2022-01-25 DIAGNOSIS — Z888 Allergy status to other drugs, medicaments and biological substances status: Secondary | ICD-10-CM | POA: Diagnosis not present

## 2022-01-25 DIAGNOSIS — Z7984 Long term (current) use of oral hypoglycemic drugs: Secondary | ICD-10-CM | POA: Diagnosis not present

## 2022-01-25 DIAGNOSIS — R000 Tachycardia, unspecified: Secondary | ICD-10-CM | POA: Insufficient documentation

## 2022-01-25 DIAGNOSIS — I700 Atherosclerosis of aorta: Secondary | ICD-10-CM | POA: Diagnosis not present

## 2022-01-25 DIAGNOSIS — M4714 Other spondylosis with myelopathy, thoracic region: Secondary | ICD-10-CM | POA: Diagnosis not present

## 2022-01-25 DIAGNOSIS — R0789 Other chest pain: Secondary | ICD-10-CM | POA: Diagnosis not present

## 2022-01-25 DIAGNOSIS — M4800 Spinal stenosis, site unspecified: Secondary | ICD-10-CM | POA: Diagnosis present

## 2022-01-25 DIAGNOSIS — I10 Essential (primary) hypertension: Secondary | ICD-10-CM | POA: Diagnosis present

## 2022-01-25 DIAGNOSIS — Z7951 Long term (current) use of inhaled steroids: Secondary | ICD-10-CM | POA: Diagnosis not present

## 2022-01-25 DIAGNOSIS — Z5181 Encounter for therapeutic drug level monitoring: Secondary | ICD-10-CM | POA: Diagnosis not present

## 2022-01-25 DIAGNOSIS — N281 Cyst of kidney, acquired: Secondary | ICD-10-CM | POA: Diagnosis not present

## 2022-01-25 DIAGNOSIS — K5730 Diverticulosis of large intestine without perforation or abscess without bleeding: Secondary | ICD-10-CM | POA: Diagnosis not present

## 2022-01-25 DIAGNOSIS — E1122 Type 2 diabetes mellitus with diabetic chronic kidney disease: Secondary | ICD-10-CM | POA: Diagnosis not present

## 2022-01-25 DIAGNOSIS — M5114 Intervertebral disc disorders with radiculopathy, thoracic region: Secondary | ICD-10-CM | POA: Diagnosis not present

## 2022-01-25 DIAGNOSIS — A419 Sepsis, unspecified organism: Secondary | ICD-10-CM | POA: Diagnosis present

## 2022-01-25 DIAGNOSIS — E860 Dehydration: Secondary | ICD-10-CM | POA: Diagnosis present

## 2022-01-25 DIAGNOSIS — M4644 Discitis, unspecified, thoracic region: Secondary | ICD-10-CM | POA: Diagnosis present

## 2022-01-25 DIAGNOSIS — Z791 Long term (current) use of non-steroidal anti-inflammatories (NSAID): Secondary | ICD-10-CM | POA: Diagnosis not present

## 2022-01-25 LAB — TROPONIN I (HIGH SENSITIVITY)
Troponin I (High Sensitivity): 6 ng/L (ref <18)
Troponin I (High Sensitivity): 7 ng/L (ref <18)

## 2022-01-25 LAB — BASIC METABOLIC PANEL
Anion gap: 11 (ref 5–15)
BUN: 14 mg/dL (ref 8–23)
CO2: 25 mmol/L (ref 22–32)
Calcium: 9.5 mg/dL (ref 8.9–10.3)
Chloride: 104 mmol/L (ref 98–111)
Creatinine, Ser: 0.72 mg/dL (ref 0.44–1.00)
GFR, Estimated: >60 mL/min (ref >60)
Glucose, Bld: 128 mg/dL — ABNORMAL HIGH (ref 70–99)
Potassium: 3.5 mmol/L (ref 3.5–5.1)
Sodium: 140 mmol/L (ref 135–145)

## 2022-01-25 LAB — HEPATIC FUNCTION PANEL
ALT: 24 U/L (ref 0–44)
AST: 32 U/L (ref 15–41)
Albumin: 4 g/dL (ref 3.5–5.0)
Alkaline Phosphatase: 81 U/L (ref 38–126)
Bilirubin, Direct: 0.3 mg/dL — ABNORMAL HIGH (ref 0.0–0.2)
Indirect Bilirubin: 0.7 mg/dL (ref 0.3–0.9)
Total Bilirubin: 1 mg/dL (ref 0.3–1.2)
Total Protein: 7.3 g/dL (ref 6.5–8.1)

## 2022-01-25 LAB — CBC
HCT: 40.9 % (ref 36.0–46.0)
Hemoglobin: 13.2 g/dL (ref 12.0–15.0)
MCH: 27.4 pg (ref 26.0–34.0)
MCHC: 32.3 g/dL (ref 30.0–36.0)
MCV: 85 fL (ref 80.0–100.0)
Platelets: 327 10*3/uL (ref 150–400)
RBC: 4.81 MIL/uL (ref 3.87–5.11)
RDW: 17.9 % — ABNORMAL HIGH (ref 11.5–15.5)
WBC: 15.8 10*3/uL — ABNORMAL HIGH (ref 4.0–10.5)
nRBC: 0 % (ref 0.0–0.2)

## 2022-01-25 LAB — CBG MONITORING, ED: Glucose-Capillary: 140 mg/dL — ABNORMAL HIGH (ref 70–99)

## 2022-01-25 LAB — LIPASE, BLOOD: Lipase: 30 U/L (ref 11–51)

## 2022-01-25 MED ORDER — DIPHENHYDRAMINE HCL 50 MG/ML IJ SOLN
50.0000 mg | Freq: Once | INTRAMUSCULAR | Status: AC
  Administered 2022-01-25: 50 mg via INTRAVENOUS
  Filled 2022-01-25: qty 1

## 2022-01-25 MED ORDER — DIPHENHYDRAMINE HCL 25 MG PO CAPS: 50.0000 mg | ORAL_CAPSULE | Freq: Once | ORAL | Status: AC

## 2022-01-25 MED ORDER — ONDANSETRON HCL 4 MG/2ML IJ SOLN
4.0000 mg | Freq: Once | INTRAMUSCULAR | Status: AC
  Administered 2022-01-25: 4 mg via INTRAVENOUS
  Filled 2022-01-25: qty 2

## 2022-01-25 MED ORDER — HYDROCORTISONE SOD SUC (PF) 100 MG IJ SOLR
INTRAMUSCULAR | Status: AC
  Filled 2022-01-25: qty 2

## 2022-01-25 MED ORDER — MORPHINE SULFATE (PF) 4 MG/ML IV SOLN
6.0000 mg | Freq: Once | INTRAVENOUS | Status: AC
  Administered 2022-01-25: 6 mg via INTRAVENOUS
  Filled 2022-01-25: qty 2

## 2022-01-25 MED ORDER — HYDROCORTISONE SOD SUC (PF) 100 MG IJ SOLR
200.0000 mg | Freq: Once | INTRAMUSCULAR | Status: AC
  Administered 2022-01-25: 200 mg via INTRAVENOUS
  Filled 2022-01-25: qty 4

## 2022-01-25 MED ORDER — IOHEXOL 350 MG/ML SOLN
100.0000 mL | Freq: Once | INTRAVENOUS | Status: AC | PRN
  Administered 2022-01-25: 100 mL via INTRAVENOUS

## 2022-01-25 MED ORDER — METHOCARBAMOL 500 MG PO TABS: 500.0000 mg | ORAL_TABLET | Freq: Two times a day (BID) | ORAL | 0 refills | Status: DC

## 2022-01-25 MED ORDER — ACETAMINOPHEN 500 MG PO TABS
1000.0000 mg | ORAL_TABLET | Freq: Once | ORAL | Status: AC
  Administered 2022-01-25: 1000 mg via ORAL
  Filled 2022-01-25: qty 2

## 2022-01-25 MED ORDER — ASPIRIN 81 MG PO CHEW
324.0000 mg | CHEWABLE_TABLET | Freq: Once | ORAL | Status: AC
  Administered 2022-01-25: 324 mg via ORAL

## 2022-01-25 MED ORDER — DIAZEPAM 2 MG PO TABS
2.0000 mg | ORAL_TABLET | Freq: Once | ORAL | Status: AC
  Administered 2022-01-25: 2 mg via ORAL
  Filled 2022-01-25: qty 1

## 2022-01-25 MED ORDER — KETOROLAC TROMETHAMINE 15 MG/ML IJ SOLN
15.0000 mg | Freq: Once | INTRAMUSCULAR | Status: AC
  Administered 2022-01-25: 15 mg via INTRAVENOUS
  Filled 2022-01-25: qty 1

## 2022-01-25 MED ORDER — DICLOFENAC SODIUM 1 % EX GEL: 4.0000 g | Freq: Four times a day (QID) | CUTANEOUS | 0 refills | Status: AC

## 2022-01-25 NOTE — ED Provider Notes (Signed)
[facility]    CSN: [id number] Arrival date & time: 01/25/22  1326      History   Chief Complaint Chief Complaint  Patient presents with   Back Pain   Chest Pain    HPI [name] is a 76 y.o. female.   Patient here today for evaluation of upper mid back pain that is now radiating to both sides of her chest, now left more than right.  She states that symptoms started while she was at church today and have worsened with time.  She reports initially she thought symptoms were related to indigestion but she has tried taking gas medication without significant relief.  She has had some shortness of breath when she is having pain.  Heart rate and blood pressure elevated today. Pain does seem to be worse with movement. She denies any falls or injuries.   The history is provided by the patient.   Past Medical History:  Diagnosis Date   Arthritis    "knees, ankles, fingers" (07/06/2018)   Asthma    Chronic lower back pain    Dyspnea    with asthma attacks    Dysrhythmia    Family history of adverse reaction to anesthesia    my first cousin had difficulty waking up    Fibroid    ovarian   GERD (gastroesophageal reflux disease)    Hip osteoarthritis    Left   History of staph infection    in hospital for 11 days/ in 2007   Hyperlipidemia    Hypertension    Radiculopathy    Type II diabetes mellitus ([place])    Wears glasses     Patient Active Problem List   Diagnosis Date Noted   Centrilobular emphysema ([place]) 11/04/2021   Chronic kidney disease, stage 2 (mild) 11/04/2021   Acute exacerbation of chronic obstructive airways disease ([place]) 11/04/2021   Primary osteoarthritis of left hip 07/06/2018   Osteoarthritis of left hip 05/18/2018   Osteoarthritis of right hip 05/17/2018   Lumbar stenosis with neurogenic claudication 01/31/2018   Elevated LFTs 12/01/2017   HTN (hypertension) 09/03/2016   Diabetes ([place]) 09/03/2016   Radiculopathy 12/11/2015    Past  Surgical History:  Procedure Laterality Date   ANTERIOR CERVICAL DECOMP/DISCECTOMY FUSION N/A 12/11/2015   Procedure: ANTERIOR CERVICAL DECOMPRESSION/DISCECTOMY FUSION 2 LEVELS;  Surgeon: Phylliss Bob, MD;  Location: [place];  Service: Orthopedics;  Laterality: N/A;  Anterior cervical decompression fusion, cervical 6-7, cervical 7-thoracic 1 with instrumentation and allograft   BACK SURGERY     CARDIAC CATHETERIZATION  1998   CATARACT EXTRACTION W/ INTRAOCULAR LENS  IMPLANT, BILATERAL Bilateral 2015   [place]   COLONOSCOPY     CRYOABLATION  1988   "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"   INCISION AND DRAINAGE Bilateral 2007>   "cleaned staph out of shoulders"   [place] LAMINECTOMY/DECOMPRESSION MICRODISCECTOMY N/A 01/31/2018   Procedure: LAMINECTOMY AND FORAMINOTOMY LUMBAR TWO- LUMBAR THREE, LUMBAR THREE- LUMBAR FOUR, LUMBAR FOUR- LUMBAR FIVE ;  Surgeon: Newman Pies, MD;  Location: [place];  Service: Neurosurgery;  Laterality: N/A;   POSTERIOR CERVICAL LAMINECTOMY  ~ [place] Right 02/2003   THUMB FUSION Right 2010   right thumb   TONSILLECTOMY     TOTAL ABDOMINAL HYSTERECTOMY  1998   TAH,BSO   TOTAL HIP ARTHROPLASTY Right 05/18/2018   Procedure: TOTAL  HIP ARTHROPLASTY ANTERIOR APPROACH;  Surgeon: Frederik Pear, MD;  Location: [place];  Service: Orthopedics;  Laterality: Right;   TOTAL HIP ARTHROPLASTY Left 07/06/2018   TOTAL HIP ARTHROPLASTY Left 07/06/2018   Procedure: LEFT TOTAL HIP ARTHROPLASTY ANTERIOR APPROACH;  Surgeon: Frederik Pear, MD;  Location: [place];  Service: Orthopedics;  Laterality: Left;  Needs RNFA   TUBAL LIGATION      OB History   No obstetric history on file.      Home Medications    Prior to Admission medications   Medication Sig Start Date End Date Taking? Authorizing Provider  acetaminophen (TYLENOL) 500 MG tablet Take 1,000 mg by mouth daily as needed  for moderate pain or headache.    [provider]  albuterol (ACCUNEB) 1.25 MG/3ML nebulizer solution 3 ml as needed 02/06/21   [provider]  amoxicillin (AMOXIL) 500 MG capsule TAKE 4 CAPSULES BY MOUTH FOR ONE DOSE THEN TAKE 4 CAPSULES BY MOUTH PRIOR TO DENTAL PROCEDURE 02/28/20   [provider]  benzonatate (TESSALON) 200 MG capsule  02/13/21   [provider]  betamethasone dipropionate 0.05 % cream     [provider]  Blood Glucose Monitoring Suppl (ONE TOUCH ULTRA 2) w/Device KIT Check blood sugar twice per week 06/14/19   [provider]  brompheniramine-pseudoephedrine-DM 30-2-10 MG/5ML syrup Take 5 mLs by mouth 3 (three) times daily as needed. 09/05/21   [provider]  bumetanide (BUMEX) 1 MG tablet  07/16/20   [provider]  cephALEXin (KEFLEX) 500 MG capsule Take 500 mg by mouth 3 (three) times daily. 09/29/21   [provider]  cetirizine (ZYRTEC ALLERGY) 10 MG tablet     [provider]  cetirizine (ZYRTEC) 10 MG tablet Take 10 mg by mouth daily as needed for allergies (during Spring/Summer).    [provider]  chlorhexidine (PERIDEX) 0.12 % solution SMARTSIG:By Mouth 03/11/21   [provider]  cholecalciferol (VITAMIN D) 1000 units tablet Take 1,000 Units by mouth daily.    [provider]  cholecalciferol (VITAMIN D3) 25 MCG (1000 UNIT) tablet 1 capsule    [provider]  cholecalciferol (VITAMIN D3) 25 MCG (1000 UNIT) tablet     [provider]  clindamycin (CLEOCIN) 300 MG capsule SMARTSIG:2 Capsule(s) By Mouth 10/14/20   [provider]  diclofenac (VOLTAREN) 75 MG EC tablet Take 1 tablet by mouth 2 (two) times daily. 12/31/18   [provider]  diltiazem (CARTIA XT) 180 MG 24 hr capsule 1 capsule 10/08/21   [provider]  docusate sodium (COLACE) 100 MG capsule Take 100 mg by mouth every other day.    [provider]  enalapril (VASOTEC) 20 MG tablet Take 20 mg by mouth at bedtime.  08/05/13   [provider]  enalapril (VASOTEC) 20 MG tablet 1 tablet 08/26/20   [provider]  etanercept (ENBREL SURECLICK) 50 MG/ML injection See admin instructions. 05/07/21   [provider]  fluorometholone (FML) 0.1 % ophthalmic suspension SMARTSIG:In Eye(s) 09/16/20   [provider]  Fluticasone-Salmeterol (ADVAIR DISKUS IN)     [provider]  Fluticasone-Salmeterol (ADVAIR DISKUS) 250-50 MCG/DOSE AEPB 1 puff    [provider]  folic acid (FOLVITE) 948 MCG tablet     [provider]  gabapentin (NEURONTIN) 300 MG capsule Take 300 mg by mouth daily.     [provider]  gabapentin (NEURONTIN) 300 MG capsule Take 1 capsule by mouth daily. 06/30/21  [provider]  glucose blood test strip  02/14/19   [provider]  hydrOXYzine (ATARAX) 10 MG tablet take 1 to 2 tablets 07/02/21   [provider]  hydrOXYzine (ATARAX/VISTARIL) 10 MG tablet Take 20 mg by mouth 2 (two) times daily. 07/02/21   [provider]  hydrOXYzine (ATARAX/VISTARIL) 25 MG tablet  10/30/19   [provider]  Lancets [facility] DELICA PLUS [id number]) [place] Use to check blood sugar 06/14/19   [provider]  lovastatin (MEVACOR) 40 MG tablet Take 40 mg by mouth at bedtime.  04/10/14   [provider]  meclizine (ANTIVERT) 12.5 MG tablet  09/19/19   [provider]  metFORMIN (GLUCOPHAGE-XR) 500 MG 24 hr tablet Take 500-1,000 mg by mouth See admin instructions. Take 1 tablet (500 mg) in the morning and take 2 tablets (1000 mg) with supper 11/11/17   [provider]  metFORMIN (GLUCOPHAGE-XR) 500 MG 24 hr tablet  01/14/21   [provider]  metFORMIN (GLUCOPHAGE-XR) 500 MG 24 hr tablet 1 tablet 01/14/21   [provider]  methotrexate (RHEUMATREX) 2.5 MG tablet Take 12.5 mg by mouth  every Sunday at [place]. Protect from light.     [provider]  Multiple Vitamin (MULTI-VITAMIN DAILY PO) 1 tablet    [provider]  Multiple Vitamin (MULTI-VITAMIN PO)     [provider]  Multiple Vitamin (MULTIVITAMIN WITH MINERALS) TABS tablet Take 1 tablet by mouth daily.    [provider]  omeprazole (PRILOSEC OTC) 20 MG tablet 1 tablet 30 minutes before morning meal    [provider]  ONETOUCH ULTRA test strip  11/20/20   [provider]  oxyCODONE-acetaminophen (PERCOCET/ROXICET) 5-325 MG tablet Take 1 tablet by mouth every 4 (four) hours as needed. 03/11/21   [provider]  sodium chloride (OCEAN) 0.65 % SOLN nasal spray Place 1-2 sprays into the nose 4 (four) times daily as needed for congestion.    [provider]  Donnal Debar 200-62.5-25 MCG/ACT AEPB Take 1 puff by mouth daily. 10/13/21   [provider]  Turmeric Curcumin 500 MG CAPS See admin instructions.    [provider]  umeclidinium-vilanterol Jearl Klinefelter ELLIPTA) 62.5-25 MCG/INH AEPB  02/06/21   [provider]    Family History Family History  Problem Relation Age of Onset   Diabetes Sister    Diabetes Brother    Diabetes Brother    Diabetes Paternal Uncle    Cancer Paternal Aunt        UTERINE   Hypertension Maternal Grandmother    Heart disease Maternal Grandmother    Colon cancer Neg Hx     Social History Social History   Tobacco Use   Smoking status: Former    Packs/day: 1.00    Years: 25.00    Pack years: 25.00    Types: Cigarettes    Quit date: 11/23/1992    Years since quitting: 29.1   Smokeless tobacco: Never  Vaping Use   Vaping Use: Never used  Substance Use Topics   Alcohol use: Not Currently   Drug use: Never     Allergies   Ivp dye [iodinated contrast media] and Prednisone   Review of Systems Review of Systems  Constitutional:  Negative for chills and fever.   Eyes:  Negative for discharge and redness.  Respiratory:  Positive for shortness of breath.   Cardiovascular:  Positive for chest pain.  Gastrointestinal:  Negative for abdominal pain, nausea and  vomiting.  Musculoskeletal:  Positive for back pain.    Physical Exam Triage Vital Signs ED Triage Vitals  Enc Vitals Group     BP      Pulse      Resp      Temp      Temp src      SpO2      Weight      Height      Head Circumference      Peak Flow      Pain Score      Pain Loc      Pain Edu?      Excl. in [place]?    No data found.  Updated Vital Signs BP (!) 184/95 (BP Location: Left Arm)    Pulse (!) 110    Temp 98.3 F (36.8 C) (Oral)    Resp 18    SpO2 95%     Physical Exam Vitals and nursing note reviewed.  Constitutional:      General: She is in acute distress (patient appears uncomfortable).     Appearance: Normal appearance. She is not ill-appearing.  HENT:     Head: Normocephalic and atraumatic.  Eyes:     Conjunctiva/sclera: Conjunctivae normal.  Cardiovascular:     Rate and Rhythm: Tachycardia present.     Comments: JVD noted to right Pulmonary:     Effort: Respiratory distress (some shortness of breath noted) present.     Breath sounds: Normal breath sounds. No wheezing or rhonchi.  Neurological:     Mental Status: She is alert.  Psychiatric:        Mood and Affect: Mood normal.        Behavior: Behavior normal.        Thought Content: Thought content normal.     UC Treatments / Results  Labs (all labs ordered are listed, but only abnormal results are displayed) Labs Reviewed - No data to display  EKG   Radiology No results found.  Procedures Procedures (including critical care time)  Medications Ordered in UC Medications  aspirin chewable tablet 324 mg (has no administration in time range)    Initial Impression / Assessment and Plan / UC Course  I have reviewed the triage vital signs and the nursing notes.  Pertinent labs & imaging  results that were available during my care of the patient were reviewed by me and considered in my medical decision making (see chart for details).    Given chest pain with some EKG changes and JVD recommended further evaluation to rule out CV etiology in the ED -- patient is transported by EMS.   Final Clinical Impressions(s) / UC Diagnoses   Final diagnoses:  Chest pain, unspecified type  JVD (jugular venous distension)  Acute bilateral thoracic back pain   Discharge Instructions   None    ED Prescriptions   None    PDMP not reviewed this encounter.   Francene Finders, PA-C 01/25/22 [phone]

## 2022-01-25 NOTE — Discharge Instructions (Addendum)
Your back pain is most likely due to a muscular strain.  There is been a lot of research on back pain, unfortunately the only thing that seems to really help is Tylenol and ibuprofen.  Relative rest is also important to not lift greater than 10 pounds bending or twisting at the waist.  Please follow-up with your family physician.  The other thing that really seems to benefit patients is physical therapy which your doctor may send you for.  Please return to the emergency department for new numbness or weakness to your arms or legs. Difficulty with urinating or urinating or pooping on yourself.  Also if you cannot feel toilet paper when you wipe or get a fever.   Use the gel as prescribed Also take tylenol 1000mg(2 extra strength) four times a day.   

## 2022-01-25 NOTE — ED Triage Notes (Signed)
Pt here for upper to mid back pain with radiation to chest starting today while in church that has became more severe throughout day; pt tried gas meds without relief ?

## 2022-01-25 NOTE — ED Provider Notes (Signed)
[place] EMERGENCY DEPARTMENT Provider Note   CSN: [id number] Arrival date & time: 01/25/22  1525     History  Chief Complaint  Patient presents with   Back Pain   Chest Pain    [name] is a 76 y.o. female.  76 yo F with a chief complaint of midthoracic back pain.  This been going on for a few hours now.  She is having trouble finding a comfortable position.  Is worse with twisting turning outpatient and trying to stand and walk.  She denies any trauma to her back.  She denies loss of bowel or bladder denies also partial sensation denies numbness or weakness to the legs.  The history is provided by the patient.  Back Pain Associated symptoms: chest pain   Chest Pain Associated symptoms: back pain       Home Medications Prior to Admission medications   Medication Sig Start Date End Date Taking? Authorizing Provider  diclofenac Sodium (VOLTAREN) 1 % GEL Apply 4 g topically 4 (four) times daily. 01/25/22  Yes Deno Etienne, DO  methocarbamol (ROBAXIN) 500 MG tablet Take 1 tablet (500 mg total) by mouth 2 (two) times daily. 01/25/22  Yes Deno Etienne, DO  acetaminophen (TYLENOL) 500 MG tablet Take 1,000 mg by mouth daily as needed for moderate pain or headache.    [provider]  albuterol (ACCUNEB) 1.25 MG/3ML nebulizer solution 3 ml as needed 02/06/21   [provider]  amoxicillin (AMOXIL) 500 MG capsule TAKE 4 CAPSULES BY MOUTH FOR ONE DOSE THEN TAKE 4 CAPSULES BY MOUTH PRIOR TO DENTAL PROCEDURE 02/28/20   [provider]  benzonatate (TESSALON) 200 MG capsule  02/13/21   [provider]  betamethasone dipropionate 0.05 % cream     [provider]  Blood Glucose Monitoring Suppl (ONE TOUCH ULTRA 2) w/Device KIT Check blood sugar twice per week 06/14/19   [provider]  brompheniramine-pseudoephedrine-DM 30-2-10 MG/5ML syrup Take 5 mLs by mouth 3 (three) times daily as needed. 09/05/21   [provider]  bumetanide (BUMEX) 1 MG tablet  07/16/20   [provider]  cephALEXin (KEFLEX) 500 MG capsule Take 500 mg by mouth 3 (three) times daily. 09/29/21   [provider]  cetirizine (ZYRTEC ALLERGY) 10 MG tablet     [provider]  cetirizine (ZYRTEC) 10 MG tablet Take 10 mg by mouth daily as needed for allergies (during Spring/Summer).    [provider]  chlorhexidine (PERIDEX) 0.12 % solution SMARTSIG:By Mouth 03/11/21   [provider]  cholecalciferol (VITAMIN D) 1000 units tablet Take 1,000 Units by mouth daily.    [provider]  cholecalciferol (VITAMIN D3) 25 MCG (1000 UNIT) tablet 1 capsule    [provider]  cholecalciferol (VITAMIN D3) 25 MCG (1000 UNIT) tablet     [provider]  clindamycin (CLEOCIN) 300 MG capsule SMARTSIG:2 Capsule(s) By Mouth 10/14/20   [provider]  diclofenac (VOLTAREN) 75 MG EC tablet Take 1 tablet by mouth 2 (two) times daily. 12/31/18   [provider]  diltiazem (CARTIA XT) 180 MG 24 hr capsule 1 capsule 10/08/21   [provider]  docusate sodium (COLACE) 100 MG capsule Take 100 mg by mouth every other day.    [provider]  enalapril (VASOTEC) 20 MG tablet Take 20 mg by mouth at bedtime.  08/05/13   [provider]  enalapril (VASOTEC) 20 MG tablet 1 tablet 08/26/20  [provider]  etanercept (ENBREL SURECLICK) 50 MG/ML injection See admin instructions. 05/07/21   [provider]  fluorometholone (FML) 0.1 % ophthalmic suspension SMARTSIG:In Eye(s) 09/16/20   [provider]  Fluticasone-Salmeterol (ADVAIR DISKUS IN)     [provider]  Fluticasone-Salmeterol (ADVAIR DISKUS) 250-50 MCG/DOSE AEPB 1 puff    [provider]  folic acid (FOLVITE) 191 MCG tablet     [provider]  gabapentin (NEURONTIN) 300 MG capsule Take 300 mg by mouth daily.     [provider]   gabapentin (NEURONTIN) 300 MG capsule Take 1 capsule by mouth daily. 06/30/21   [provider]  glucose blood test strip  02/14/19   [provider]  hydrOXYzine (ATARAX) 10 MG tablet take 1 to 2 tablets 07/02/21   [provider]  hydrOXYzine (ATARAX/VISTARIL) 10 MG tablet Take 20 mg by mouth 2 (two) times daily. 07/02/21   [provider]  hydrOXYzine (ATARAX/VISTARIL) 25 MG tablet  10/30/19   [provider]  Lancets [facility] DELICA PLUS [id number]) [place] Use to check blood sugar 06/14/19   [provider]  lovastatin (MEVACOR) 40 MG tablet Take 40 mg by mouth at bedtime.  04/10/14   [provider]  meclizine (ANTIVERT) 12.5 MG tablet  09/19/19   [provider]  metFORMIN (GLUCOPHAGE-XR) 500 MG 24 hr tablet Take 500-1,000 mg by mouth See admin instructions. Take 1 tablet (500 mg) in the morning and take 2 tablets (1000 mg) with supper 11/11/17   [provider]  metFORMIN (GLUCOPHAGE-XR) 500 MG 24 hr tablet  01/14/21   [provider]  metFORMIN (GLUCOPHAGE-XR) 500 MG 24 hr tablet 1 tablet 01/14/21   [provider]  methotrexate (RHEUMATREX) 2.5 MG tablet Take 12.5 mg by mouth every Sunday at [place]. Protect from light.     [provider]  Multiple Vitamin (MULTI-VITAMIN DAILY PO) 1 tablet    [provider]  Multiple Vitamin (MULTI-VITAMIN PO)     [provider]  Multiple Vitamin (MULTIVITAMIN WITH MINERALS) TABS tablet Take 1 tablet by mouth daily.    [provider]  omeprazole (PRILOSEC OTC) 20 MG tablet 1 tablet 30 minutes before morning meal    [provider]  ONETOUCH ULTRA test strip  11/20/20   [provider]  oxyCODONE-acetaminophen (PERCOCET/ROXICET) 5-325 MG tablet Take 1 tablet by mouth every 4 (four) hours as needed. 03/11/21   [provider]  sodium chloride (OCEAN) 0.65 % SOLN nasal spray Place 1-2  sprays into the nose 4 (four) times daily as needed for congestion.    [provider]  Donnal Debar 200-62.5-25 MCG/ACT AEPB Take 1 puff by mouth daily. 10/13/21   [provider]  Turmeric Curcumin 500 MG CAPS See admin instructions.    [provider]  umeclidinium-vilanterol Jearl Klinefelter ELLIPTA) 62.5-25 MCG/INH AEPB  02/06/21   [provider]      Allergies    Ivp dye [iodinated contrast media] and Prednisone    Review of Systems   Review of Systems  Cardiovascular:  Positive for chest pain.  Musculoskeletal:  Positive for back pain.   Physical Exam Updated Vital Signs BP (!) 183/102    Pulse (!) 109    Temp 98.5 F (36.9 C)    Resp 20    SpO2 99%  Physical Exam Vitals and nursing note reviewed.  Constitutional:      General: She is not in acute distress.  Appearance: She is well-developed. She is not diaphoretic.  HENT:     Head: Normocephalic and atraumatic.  Eyes:     Pupils: Pupils are equal, round, and reactive to light.  Cardiovascular:     Rate and Rhythm: Normal rate and regular rhythm.     Heart sounds: No murmur heard.   No friction rub. No gallop.  Pulmonary:     Effort: Pulmonary effort is normal.     Breath sounds: No wheezing or rales.  Abdominal:     General: There is no distension.     Palpations: Abdomen is soft.     Tenderness: There is no abdominal tenderness.     Comments: No obvious abdominal discomfort.   Musculoskeletal:        General: No tenderness.     Cervical back: Normal range of motion and neck supple.     Comments: Tenderness about the low thoracic spine.  No obvious midline spinal tenderness step-offs or deformities.  Skin:    General: Skin is warm and dry.  Neurological:     Mental Status: She is alert and oriented to person, place, and time.  Psychiatric:        Behavior: Behavior normal.    ED Results / Procedures / Treatments   Labs (all labs ordered are listed, but only abnormal results  are displayed) Labs Reviewed  BASIC METABOLIC PANEL - Abnormal; Notable for the following components:      Result Value   Glucose, Bld 128 (*)    All other components within normal limits  CBC - Abnormal; Notable for the following components:   WBC 15.8 (*)    RDW 17.9 (*)    All other components within normal limits  HEPATIC FUNCTION PANEL - Abnormal; Notable for the following components:   Bilirubin, Direct 0.3 (*)    All other components within normal limits  CBG MONITORING, ED - Abnormal; Notable for the following components:   Glucose-Capillary 140 (*)    All other components within normal limits  LIPASE, BLOOD  TROPONIN I (HIGH SENSITIVITY)  TROPONIN I (HIGH SENSITIVITY)    EKG EKG Interpretation  Date/Time:  Sunday January 25 2022 15:34:15 EST Ventricular Rate:  117 PR Interval:  158 QRS Duration: 82 QT Interval:  326 QTC Calculation: 454 R Axis:   -51 Text Interpretation: Sinus tachycardia Right atrial enlargement Left anterior fascicular block Septal infarct , age undetermined Possible Lateral infarct , age undetermined Cannot rule out Inferior infarct (masked by fascicular block?) , age undetermined Abnormal ECG No significant change since last tracing Confirmed by Deno Etienne [phone]) on 01/25/2022 3:48:22 PM  Radiology DG Chest 2 View  Result Date: 01/25/2022 CLINICAL DATA:  Chest pain. EXAM: CHEST - 2 VIEW COMPARISON:  05/09/2018. FINDINGS: Cardiac silhouette is normal in size. No mediastinal or hilar masses or evidence of adenopathy. Clear lungs.  No pleural effusion or pneumothorax. Skeletal structures are intact. IMPRESSION: No active cardiopulmonary disease. Electronically Signed   By: Lajean Manes M.D.   On: 01/25/2022 15:53   CT Angio Chest/Abd/Pel for Dissection W and/or Wo Contrast  Result Date: 01/25/2022 CLINICAL DATA:  Acute onset of chest and back pain, initial encounter EXAM: CT ANGIOGRAPHY CHEST, ABDOMEN AND PELVIS TECHNIQUE: Non-contrast CT of the chest  was initially obtained. Multidetector CT imaging through the chest, abdomen and pelvis was performed using the standard protocol during bolus administration of intravenous contrast. Multiplanar reconstructed images and MIPs were obtained and reviewed to evaluate the vascular anatomy. RADIATION  DOSE REDUCTION: This exam was performed according to the departmental dose-optimization program which includes automated exposure control, adjustment of the mA and/or kV according to patient size and/or use of iterative reconstruction technique. CONTRAST:  145m OMNIPAQUE IOHEXOL 350 MG/ML SOLN patient was pre-medicated for known contrast allergy COMPARISON:  Chest x-ray from earlier in the same day. FINDINGS: CTA CHEST FINDINGS Cardiovascular: Atherosclerotic calcifications of the thoracic aorta are noted. No aneurysmal dilatation is seen. No findings of dissection are noted. Heart is not significantly enlarged. Mild coronary calcifications are seen. The pulmonary artery as visualized shows no evidence of filling defect to suggest pulmonary embolism. Mediastinum/Nodes: Thoracic inlet is within normal limits. Esophagus as visualized is within normal limits. No sizable hilar or mediastinal adenopathy is noted. Lungs/Pleura: Mild atelectatic changes are noted in the bases. No focal confluent infiltrate is seen. No sizable parenchymal nodule is noted. Minimal emphysematous changes are noted. Musculoskeletal: Postsurgical changes are noted at the cervicothoracic junction. No acute rib abnormality is noted. Degenerative changes of the thoracic spine are seen. Significant sclerosis of the T10 vertebral body is noted of uncertain significance. These changes are new from prior CT in 2007 as well as prior plain film from 2019. These changes may be simply related to underlying degenerative change. Review of the MIP images confirms the above findings. CTA ABDOMEN AND PELVIS FINDINGS VASCULAR Aorta: Atherosclerotic calcifications are  noted without aneurysmal dilatation or dissection. Celiac: Patent without evidence of aneurysm, dissection, vasculitis or significant stenosis. SMA: Patent without evidence of aneurysm, dissection, vasculitis or significant stenosis. 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 No focal stenosis is seen. IMA: Patent without evidence of aneurysm, dissection, vasculitis or significant stenosis. Inflow: Iliacs demonstrate atherosclerotic calcifications without aneurysmal dilatation or dissection. Veins: No specific venous abnormality is seen. Review of the MIP images confirms the above findings. NON-VASCULAR Hepatobiliary: Liver is mildly decreased in attenuation consistent with fatty infiltration. Gallbladder is within normal limits. Pancreas: Unremarkable. No pancreatic ductal dilatation or surrounding inflammatory changes. Spleen: Normal in size without focal abnormality. Adrenals/Urinary Tract: Adrenal glands are within normal limits. Kidneys demonstrate renal cystic change on the right. No definitive renal calculi are seen. No obstructive changes are noted. The bladder is partially distended. Pelvis is somewhat obscured by scatter from bilateral hip replacements. Stomach/Bowel: Scattered diverticular change of the colon is noted without evidence of diverticulitis. The appendix is within normal limits. Small bowel and stomach are unremarkable. Small duodenal diverticulum is noted adjacent to the head of the pancreas. Lymphatic: No significant lymphadenopathy is seen. Reproductive: Status post hysterectomy. No adnexal masses. Other: No abdominal wall hernia or abnormality. No abdominopelvic ascites. 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 No acute bony abnormality is noted. Degenerative changes of lumbar spine are seen. Review of the MIP images confirms the above findings. IMPRESSION: No evidence of aortic dissection or aneurysmal dilatation. No evidence of pulmonary emboli. Degenerative changes  of the thoracic and lumbar spine as described. Mild fatty infiltration of the liver. Diverticulosis without diverticulitis. Aortic Atherosclerosis (ICD10-I70.0) and Emphysema (ICD10-J43.9). Electronically Signed   By: MInez CatalinaM.D.   On: 01/25/2022 21:37    Procedures Procedures    Medications Ordered in ED Medications  ketorolac (TORADOL) 15 MG/ML injection 15 mg (has no administration in time range)  acetaminophen (TYLENOL) tablet 1,000 mg (has no administration in time range)  diazepam (VALIUM) tablet 2 mg (has no administration in time range)  hydrocortisone sodium succinate (SOLU-CORTEF) 100 MG injection 200 mg (200 mg Intravenous Given 01/25/22 1707)  diphenhydrAMINE (BENADRYL) capsule 50 mg ( Oral See Alternative 01/25/22 2007)    Or  diphenhydrAMINE (BENADRYL) injection 50 mg (50 mg Intravenous Given 01/25/22 2007)  morphine (PF) 4 MG/ML injection 6 mg (6 mg Intravenous Given 01/25/22 1705)  ondansetron (ZOFRAN) injection 4 mg (4 mg Intravenous Given 01/25/22 1704)  hydrocortisone sodium succinate (SOLU-CORTEF) 100 MG injection (  Given 01/25/22 1714)  morphine (PF) 4 MG/ML injection 6 mg (6 mg Intravenous Given 01/25/22 2103)  ondansetron (ZOFRAN) injection 4 mg (4 mg Intravenous Given 01/25/22 2103)  iohexol (OMNIPAQUE) 350 MG/ML injection 100 mL (100 mLs Intravenous Contrast Given 01/25/22 2107)    ED Course/ Medical Decision Making/ A&P                           Medical Decision Making Amount and/or Complexity of Data Reviewed Labs: ordered. Radiology: ordered.  Risk OTC drugs. Prescription drug management.   76 yo F with a cc of back pain.  This started earlier today and got worse during church.  She felt like it was an ache down below her shoulder blades bilaterally and she thought maybe it was gas she took some medicines at home without improvement.  She got up and try to walk also without improving anything.  Pain got worse throughout the day to the point where is excruciating.   She feels like it is a 10 out of 10 currently.  She feels like it radiates from the back to the front.  Denies any nausea or vomiting denies fevers.  Denies trauma.  She does appear very uncomfortable on initial exam.  We will obtain dissection study.  Unfortunately the patient has a contrast dye allergy.  We will start the pretreatment process.  Lab work.  Reassess.  She has a mild leukocytosis of 15,000 no significant electrolyte abnormality.  Chest x-ray independently interpreted by me without focal infiltrate or pneumothorax.  Normal mediastinal size.  Dissection study was performed and shows no obvious intrathoracic or abdominal pathology.  I reassessed the patient who is feeling a bit better at this point.  She still has not completely relieved of pain.  I discussed the most likely cause of her pain with her back and she endorsed that she has had problems with her back in the past and has required prior back surgery.  She inquired if it made sense for her to reach out to her spinal surgeon.  I discussed with her home care.  10:27 PM:  I have discussed the diagnosis/risks/treatment options with the patient.  Evaluation and diagnostic testing in the emergency department does not suggest an emergent condition requiring admission or immediate intervention beyond what has been performed at this time.  They will follow up with  PCP. We also discussed returning to the ED immediately if new or worsening sx occur. We discussed the sx which are most concerning (e.g., sudden worsening pain, fever, inability to tolerate by mouth, cauda equina) that necessitate immediate return. Medications administered to the patient during their visit and any new prescriptions provided to the patient are listed below.  Medications given during this visit Medications  ketorolac (TORADOL) 15 MG/ML injection 15 mg (has no administration in time range)  acetaminophen (TYLENOL) tablet 1,000 mg (has no administration in time  range)  diazepam (VALIUM) tablet 2 mg (has no administration in time range)  hydrocortisone sodium succinate (SOLU-CORTEF) 100 MG injection 200 mg (200 mg Intravenous Given 01/25/22 1707)  diphenhydrAMINE (BENADRYL)  capsule 50 mg ( Oral See Alternative 01/25/22 2007)    Or  diphenhydrAMINE (BENADRYL) injection 50 mg (50 mg Intravenous Given 01/25/22 2007)  morphine (PF) 4 MG/ML injection 6 mg (6 mg Intravenous Given 01/25/22 1705)  ondansetron (ZOFRAN) injection 4 mg (4 mg Intravenous Given 01/25/22 1704)  hydrocortisone sodium succinate (SOLU-CORTEF) 100 MG injection (  Given 01/25/22 1714)  morphine (PF) 4 MG/ML injection 6 mg (6 mg Intravenous Given 01/25/22 2103)  ondansetron (ZOFRAN) injection 4 mg (4 mg Intravenous Given 01/25/22 2103)  iohexol (OMNIPAQUE) 350 MG/ML injection 100 mL (100 mLs Intravenous Contrast Given 01/25/22 2107)     The patient appears reasonably screen and/or stabilized for discharge and I doubt any other medical condition or other [facility] requiring further screening, evaluation, or treatment in the ED at this time prior to discharge.          Final Clinical Impression(s) / ED Diagnoses Final diagnoses:  Acute bilateral thoracic back pain    Rx / DC Orders ED Discharge Orders          Ordered    diclofenac Sodium (VOLTAREN) 1 % GEL  4 times daily        01/25/22 2217    methocarbamol (ROBAXIN) 500 MG tablet  2 times daily        01/25/22 2217              Deno Etienne, DO 01/25/22 2227

## 2022-01-25 NOTE — ED Triage Notes (Signed)
Pt c/o mid back/shoulder blade pain wrapping into chest during church. Pt took gas meds without relief. Seen at [facility] for same, sent to ED for further eval of CP/EKG changes. 324ASA PTA, 0.'4mg'$  NTG brought pain from 10/10-7/10 ? ?20RAC ?VSS ?

## 2022-01-26 ENCOUNTER — Inpatient Hospital Stay (HOSPITAL_COMMUNITY)
Admission: EM | Admit: 2022-01-26 | Discharge: 2022-01-31 | DRG: 853 | Disposition: A | Discharge status: Home Health | Payer: HMO | Attending: Internal Medicine | Admitting: Internal Medicine

## 2022-01-26 ENCOUNTER — Ambulatory Visit: Payer: HMO | Admitting: Podiatry

## 2022-01-26 ENCOUNTER — Other Ambulatory Visit: Payer: Self-pay

## 2022-01-26 ENCOUNTER — Emergency Department (HOSPITAL_COMMUNITY): Payer: HMO

## 2022-01-26 ENCOUNTER — Encounter (HOSPITAL_COMMUNITY): Payer: Self-pay | Admitting: Internal Medicine

## 2022-01-26 DIAGNOSIS — I10 Essential (primary) hypertension: Secondary | ICD-10-CM | POA: Diagnosis not present

## 2022-01-26 DIAGNOSIS — J432 Centrilobular emphysema: Secondary | ICD-10-CM | POA: Diagnosis present

## 2022-01-26 DIAGNOSIS — E119 Type 2 diabetes mellitus without complications: Secondary | ICD-10-CM

## 2022-01-26 DIAGNOSIS — M069 Rheumatoid arthritis, unspecified: Secondary | ICD-10-CM | POA: Diagnosis present

## 2022-01-26 DIAGNOSIS — G061 Intraspinal abscess and granuloma: Secondary | ICD-10-CM | POA: Diagnosis present

## 2022-01-26 DIAGNOSIS — Z7984 Long term (current) use of oral hypoglycemic drugs: Secondary | ICD-10-CM | POA: Diagnosis not present

## 2022-01-26 DIAGNOSIS — M4800 Spinal stenosis, site unspecified: Secondary | ICD-10-CM | POA: Diagnosis present

## 2022-01-26 DIAGNOSIS — E860 Dehydration: Secondary | ICD-10-CM | POA: Diagnosis present

## 2022-01-26 DIAGNOSIS — Z79631 Long term (current) use of antimetabolite agent: Secondary | ICD-10-CM

## 2022-01-26 DIAGNOSIS — Z888 Allergy status to other drugs, medicaments and biological substances status: Secondary | ICD-10-CM | POA: Diagnosis not present

## 2022-01-26 DIAGNOSIS — M4644 Discitis, unspecified, thoracic region: Secondary | ICD-10-CM | POA: Diagnosis present

## 2022-01-26 DIAGNOSIS — R000 Tachycardia, unspecified: Secondary | ICD-10-CM | POA: Diagnosis not present

## 2022-01-26 DIAGNOSIS — A419 Sepsis, unspecified organism: Secondary | ICD-10-CM | POA: Diagnosis not present

## 2022-01-26 DIAGNOSIS — Z79899 Other long term (current) drug therapy: Secondary | ICD-10-CM | POA: Diagnosis not present

## 2022-01-26 DIAGNOSIS — Z91041 Radiographic dye allergy status: Secondary | ICD-10-CM

## 2022-01-26 DIAGNOSIS — Z87891 Personal history of nicotine dependence: Secondary | ICD-10-CM | POA: Diagnosis not present

## 2022-01-26 DIAGNOSIS — E1122 Type 2 diabetes mellitus with diabetic chronic kidney disease: Secondary | ICD-10-CM

## 2022-01-26 DIAGNOSIS — Z5181 Encounter for therapeutic drug level monitoring: Secondary | ICD-10-CM | POA: Diagnosis not present

## 2022-01-26 DIAGNOSIS — Z6833 Body mass index (BMI) 33.0-33.9, adult: Secondary | ICD-10-CM

## 2022-01-26 DIAGNOSIS — Z8614 Personal history of Methicillin resistant Staphylococcus aureus infection: Secondary | ICD-10-CM

## 2022-01-26 DIAGNOSIS — R338 Other retention of urine: Secondary | ICD-10-CM | POA: Diagnosis present

## 2022-01-26 DIAGNOSIS — M4640 Discitis, unspecified, site unspecified: Secondary | ICD-10-CM | POA: Diagnosis present

## 2022-01-26 DIAGNOSIS — R7989 Other specified abnormal findings of blood chemistry: Secondary | ICD-10-CM | POA: Diagnosis present

## 2022-01-26 DIAGNOSIS — D849 Immunodeficiency, unspecified: Secondary | ICD-10-CM | POA: Diagnosis not present

## 2022-01-26 DIAGNOSIS — M546 Pain in thoracic spine: Secondary | ICD-10-CM | POA: Diagnosis not present

## 2022-01-26 DIAGNOSIS — G062 Extradural and subdural abscess, unspecified: Secondary | ICD-10-CM

## 2022-01-26 DIAGNOSIS — M48061 Spinal stenosis, lumbar region without neurogenic claudication: Secondary | ICD-10-CM | POA: Diagnosis present

## 2022-01-26 DIAGNOSIS — J45909 Unspecified asthma, uncomplicated: Secondary | ICD-10-CM | POA: Diagnosis present

## 2022-01-26 DIAGNOSIS — Z981 Arthrodesis status: Secondary | ICD-10-CM

## 2022-01-26 DIAGNOSIS — K5900 Constipation, unspecified: Secondary | ICD-10-CM | POA: Diagnosis present

## 2022-01-26 DIAGNOSIS — G9340 Encephalopathy, unspecified: Secondary | ICD-10-CM | POA: Diagnosis not present

## 2022-01-26 DIAGNOSIS — A4101 Sepsis due to Methicillin susceptible Staphylococcus aureus: Secondary | ICD-10-CM | POA: Diagnosis not present

## 2022-01-26 DIAGNOSIS — Z96643 Presence of artificial hip joint, bilateral: Secondary | ICD-10-CM | POA: Diagnosis present

## 2022-01-26 DIAGNOSIS — M4624 Osteomyelitis of vertebra, thoracic region: Secondary | ICD-10-CM | POA: Diagnosis present

## 2022-01-26 DIAGNOSIS — Z7951 Long term (current) use of inhaled steroids: Secondary | ICD-10-CM | POA: Diagnosis not present

## 2022-01-26 DIAGNOSIS — Z8249 Family history of ischemic heart disease and other diseases of the circulatory system: Secondary | ICD-10-CM

## 2022-01-26 DIAGNOSIS — Z8619 Personal history of other infectious and parasitic diseases: Secondary | ICD-10-CM

## 2022-01-26 DIAGNOSIS — R339 Retention of urine, unspecified: Secondary | ICD-10-CM | POA: Diagnosis present

## 2022-01-26 DIAGNOSIS — Z9841 Cataract extraction status, right eye: Secondary | ICD-10-CM

## 2022-01-26 DIAGNOSIS — Z90710 Acquired absence of both cervix and uterus: Secondary | ICD-10-CM

## 2022-01-26 DIAGNOSIS — Z961 Presence of intraocular lens: Secondary | ICD-10-CM | POA: Diagnosis present

## 2022-01-26 DIAGNOSIS — E876 Hypokalemia: Secondary | ICD-10-CM | POA: Diagnosis not present

## 2022-01-26 DIAGNOSIS — Z791 Long term (current) use of non-steroidal anti-inflammatories (NSAID): Secondary | ICD-10-CM

## 2022-01-26 DIAGNOSIS — E785 Hyperlipidemia, unspecified: Secondary | ICD-10-CM | POA: Diagnosis present

## 2022-01-26 DIAGNOSIS — Z9842 Cataract extraction status, left eye: Secondary | ICD-10-CM

## 2022-01-26 DIAGNOSIS — E1169 Type 2 diabetes mellitus with other specified complication: Secondary | ICD-10-CM | POA: Diagnosis present

## 2022-01-26 DIAGNOSIS — K219 Gastro-esophageal reflux disease without esophagitis: Secondary | ICD-10-CM | POA: Diagnosis present

## 2022-01-26 DIAGNOSIS — R531 Weakness: Secondary | ICD-10-CM | POA: Diagnosis not present

## 2022-01-26 DIAGNOSIS — R509 Fever, unspecified: Secondary | ICD-10-CM | POA: Diagnosis not present

## 2022-01-26 DIAGNOSIS — Z833 Family history of diabetes mellitus: Secondary | ICD-10-CM

## 2022-01-26 DIAGNOSIS — B9561 Methicillin susceptible Staphylococcus aureus infection as the cause of diseases classified elsewhere: Secondary | ICD-10-CM | POA: Diagnosis present

## 2022-01-26 DIAGNOSIS — Z20822 Contact with and (suspected) exposure to covid-19: Secondary | ICD-10-CM | POA: Diagnosis present

## 2022-01-26 DIAGNOSIS — G8929 Other chronic pain: Secondary | ICD-10-CM | POA: Diagnosis present

## 2022-01-26 LAB — COMPREHENSIVE METABOLIC PANEL
ALT: 21 U/L (ref 0–44)
AST: 20 U/L (ref 15–41)
Albumin: 3.6 g/dL (ref 3.5–5.0)
Alkaline Phosphatase: 84 U/L (ref 38–126)
Anion gap: 12 (ref 5–15)
BUN: 19 mg/dL (ref 8–23)
CO2: 25 mmol/L (ref 22–32)
Calcium: 9 mg/dL (ref 8.9–10.3)
Chloride: 100 mmol/L (ref 98–111)
Creatinine, Ser: 0.94 mg/dL (ref 0.44–1.00)
GFR, Estimated: >60 mL/min (ref >60)
Glucose, Bld: 116 mg/dL — ABNORMAL HIGH (ref 70–99)
Potassium: 3 mmol/L — ABNORMAL LOW (ref 3.5–5.1)
Sodium: 137 mmol/L (ref 135–145)
Total Bilirubin: 1 mg/dL (ref 0.3–1.2)
Total Protein: 7.1 g/dL (ref 6.5–8.1)

## 2022-01-26 LAB — CBC WITH DIFFERENTIAL/PLATELET
Abs Immature Granulocytes: 0.31 10*3/uL — ABNORMAL HIGH (ref 0.00–0.07)
Basophils Absolute: 0 10*3/uL (ref 0.0–0.1)
Basophils Relative: 0 %
Eosinophils Absolute: 0 10*3/uL (ref 0.0–0.5)
Eosinophils Relative: 0 %
HCT: 40.5 % (ref 36.0–46.0)
Hemoglobin: 13 g/dL (ref 12.0–15.0)
Immature Granulocytes: 1 %
Lymphocytes Relative: 2 %
Lymphs Abs: 0.6 10*3/uL — ABNORMAL LOW (ref 0.7–4.0)
MCH: 27.2 pg (ref 26.0–34.0)
MCHC: 32.1 g/dL (ref 30.0–36.0)
MCV: 84.7 fL (ref 80.0–100.0)
Monocytes Absolute: 1.9 10*3/uL — ABNORMAL HIGH (ref 0.1–1.0)
Monocytes Relative: 7 %
Neutro Abs: 23 10*3/uL — ABNORMAL HIGH (ref 1.7–7.7)
Neutrophils Relative %: 90 %
Platelets: 256 10*3/uL (ref 150–400)
RBC: 4.78 MIL/uL (ref 3.87–5.11)
RDW: 17.7 % — ABNORMAL HIGH (ref 11.5–15.5)
WBC: 25.8 10*3/uL — ABNORMAL HIGH (ref 4.0–10.5)
nRBC: 0 % (ref 0.0–0.2)

## 2022-01-26 LAB — RESP PANEL BY RT-PCR (FLU A&B, COVID) ARPGX2
Influenza A by PCR: NEGATIVE
Influenza B by PCR: NEGATIVE
SARS Coronavirus 2 by RT PCR: NEGATIVE

## 2022-01-26 LAB — PROTIME-INR
INR: 1.1 (ref 0.8–1.2)
Prothrombin Time: 14.2 seconds (ref 11.4–15.2)

## 2022-01-26 LAB — LACTIC ACID, PLASMA: Lactic Acid, Venous: 2.7 mmol/L (ref 0.5–1.9)

## 2022-01-26 LAB — APTT: aPTT: 22 seconds — ABNORMAL LOW (ref 24–36)

## 2022-01-26 LAB — TROPONIN I (HIGH SENSITIVITY)
Troponin I (High Sensitivity): 17 ng/L (ref <18)
Troponin I (High Sensitivity): 23 ng/L — ABNORMAL HIGH (ref <18)

## 2022-01-26 LAB — LIPASE, BLOOD: Lipase: 26 U/L (ref 11–51)

## 2022-01-26 MED ORDER — VANCOMYCIN HCL 1500 MG/300ML IV SOLN
1500.0000 mg | Freq: Once | INTRAVENOUS | Status: AC
Start: 2022-01-26 — End: 2022-01-27
  Administered 2022-01-26: 1500 mg via INTRAVENOUS
  Filled 2022-01-26: qty 300

## 2022-01-26 MED ORDER — ACETAMINOPHEN 325 MG PO TABS
650.0000 mg | ORAL_TABLET | Freq: Four times a day (QID) | ORAL | Status: DC | PRN
  Administered 2022-01-27 – 2022-01-31 (×9): 650 mg via ORAL
  Filled 2022-01-26 (×9): qty 2

## 2022-01-26 MED ORDER — DILTIAZEM HCL ER COATED BEADS 180 MG PO CP24
180.0000 mg | ORAL_CAPSULE | Freq: Every day | ORAL | Status: DC
  Administered 2022-01-27 – 2022-01-31 (×5): 180 mg via ORAL
  Filled 2022-01-26 (×5): qty 1

## 2022-01-26 MED ORDER — FOLIC ACID 800 MCG PO TABS: 800.0000 ug | ORAL_TABLET | Freq: Every day | ORAL | Status: DC

## 2022-01-26 MED ORDER — SODIUM CHLORIDE 0.9 % IV SOLN
2.0000 g | Freq: Two times a day (BID) | INTRAVENOUS | Status: DC
  Administered 2022-01-27: 2 g via INTRAVENOUS
  Filled 2022-01-26: qty 2

## 2022-01-26 MED ORDER — SODIUM CHLORIDE 0.9 % IV SOLN
2.0000 g | Freq: Once | INTRAVENOUS | Status: AC
  Administered 2022-01-26: 2 g via INTRAVENOUS
  Filled 2022-01-26: qty 2

## 2022-01-26 MED ORDER — ENALAPRIL MALEATE 5 MG PO TABS
20.0000 mg | ORAL_TABLET | Freq: Two times a day (BID) | ORAL | Status: DC
  Administered 2022-01-27 – 2022-01-31 (×8): 20 mg via ORAL
  Filled 2022-01-26 (×3): qty 4
  Filled 2022-01-26: qty 1
  Filled 2022-01-26: qty 4
  Filled 2022-01-26: qty 1
  Filled 2022-01-26 (×6): qty 4

## 2022-01-26 MED ORDER — LACTATED RINGERS IV BOLUS
1000.0000 mL | Freq: Once | INTRAVENOUS | Status: AC
Start: 2022-01-26 — End: 2022-01-26
  Administered 2022-01-26: 1000 mL via INTRAVENOUS

## 2022-01-26 MED ORDER — OMEPRAZOLE MAGNESIUM 20 MG PO TBEC: 20.0000 mg | DELAYED_RELEASE_TABLET | Freq: Every day | ORAL | Status: DC | PRN

## 2022-01-26 MED ORDER — GABAPENTIN 300 MG PO CAPS
300.0000 mg | ORAL_CAPSULE | Freq: Every day | ORAL | Status: DC
  Administered 2022-01-27 – 2022-01-30 (×5): 300 mg via ORAL
  Filled 2022-01-26 (×5): qty 1

## 2022-01-26 MED ORDER — MORPHINE SULFATE (PF) 2 MG/ML IV SOLN
1.0000 mg | INTRAVENOUS | Status: DC | PRN
  Administered 2022-01-27: 1 mg via INTRAVENOUS
  Filled 2022-01-26: qty 1

## 2022-01-26 MED ORDER — VANCOMYCIN HCL IN DEXTROSE 1-5 GM/200ML-% IV SOLN
1000.0000 mg | Freq: Once | INTRAVENOUS | Status: DC
  Filled 2022-01-26: qty 200

## 2022-01-26 MED ORDER — FLUTICASONE FUROATE-VILANTEROL 200-25 MCG/ACT IN AEPB
1.0000 | INHALATION_SPRAY | Freq: Every day | RESPIRATORY_TRACT | Status: DC
  Administered 2022-01-29 – 2022-01-31 (×3): 1 via RESPIRATORY_TRACT
  Filled 2022-01-26 (×2): qty 28

## 2022-01-26 MED ORDER — UMECLIDINIUM BROMIDE 62.5 MCG/ACT IN AEPB
1.0000 | INHALATION_SPRAY | Freq: Every day | RESPIRATORY_TRACT | Status: DC
Start: 2022-01-27 — End: 2022-01-31
  Administered 2022-01-29 – 2022-01-31 (×3): 1 via RESPIRATORY_TRACT
  Filled 2022-01-26 (×2): qty 7

## 2022-01-26 MED ORDER — METHOCARBAMOL 500 MG PO TABS
500.0000 mg | ORAL_TABLET | Freq: Three times a day (TID) | ORAL | Status: DC | PRN
  Administered 2022-01-27 – 2022-01-31 (×10): 500 mg via ORAL
  Filled 2022-01-26 (×11): qty 1

## 2022-01-26 MED ORDER — LACTATED RINGERS IV SOLN: INTRAVENOUS | Status: AC

## 2022-01-26 MED ORDER — HYDROMORPHONE HCL 1 MG/ML IJ SOLN
0.5000 mg | Freq: Once | INTRAMUSCULAR | Status: AC
  Administered 2022-01-26: 0.5 mg via INTRAVENOUS
  Filled 2022-01-26: qty 1

## 2022-01-26 MED ORDER — GADOBUTROL 1 MMOL/ML IV SOLN
7.0000 mL | Freq: Once | INTRAVENOUS | Status: AC | PRN
  Administered 2022-01-26: 7 mL via INTRAVENOUS

## 2022-01-26 MED ORDER — FOLIC ACID 1 MG PO TABS
0.5000 mg | ORAL_TABLET | Freq: Every day | ORAL | Status: DC
  Administered 2022-01-28 – 2022-01-31 (×4): 0.5 mg via ORAL
  Filled 2022-01-26 (×5): qty 1

## 2022-01-26 MED ORDER — INSULIN ASPART 100 UNIT/ML IJ SOLN
0.0000 [IU] | Freq: Three times a day (TID) | INTRAMUSCULAR | Status: DC
  Administered 2022-01-29: 09:00:00 2 [IU] via SUBCUTANEOUS
  Administered 2022-01-29 – 2022-01-31 (×4): 1 [IU] via SUBCUTANEOUS

## 2022-01-26 MED ORDER — ACETAMINOPHEN 500 MG PO TABS
1000.0000 mg | ORAL_TABLET | Freq: Once | ORAL | Status: AC
  Administered 2022-01-26: 1000 mg via ORAL
  Filled 2022-01-26: qty 2

## 2022-01-26 MED ORDER — ACETAMINOPHEN 325 MG PO TABS
650.0000 mg | ORAL_TABLET | Freq: Once | ORAL | Status: AC
  Administered 2022-01-26: 650 mg via ORAL
  Filled 2022-01-26: qty 2

## 2022-01-26 MED ORDER — PRAVASTATIN SODIUM 40 MG PO TABS
40.0000 mg | ORAL_TABLET | Freq: Every day | ORAL | Status: DC
  Administered 2022-01-27 – 2022-01-30 (×4): 40 mg via ORAL
  Filled 2022-01-26 (×4): qty 1

## 2022-01-26 MED ORDER — PANTOPRAZOLE SODIUM 40 MG IV SOLR: 40.0000 mg | Freq: Every day | INTRAVENOUS | Status: DC | PRN

## 2022-01-26 MED ORDER — ACETAMINOPHEN 650 MG RE SUPP: 650.0000 mg | Freq: Four times a day (QID) | RECTAL | Status: DC | PRN

## 2022-01-26 MED ORDER — DOCUSATE SODIUM 100 MG PO CAPS
100.0000 mg | ORAL_CAPSULE | Freq: Every day | ORAL | Status: DC | PRN
  Administered 2022-01-29 – 2022-01-31 (×2): 100 mg via ORAL
  Filled 2022-01-26 (×2): qty 1

## 2022-01-26 MED ORDER — VANCOMYCIN HCL 750 MG/150ML IV SOLN: 750.0000 mg | INTRAVENOUS | Status: DC

## 2022-01-26 MED ORDER — METRONIDAZOLE 500 MG/100ML IV SOLN
500.0000 mg | Freq: Once | INTRAVENOUS | Status: AC
  Administered 2022-01-26: 500 mg via INTRAVENOUS
  Filled 2022-01-26: qty 100

## 2022-01-26 NOTE — Sepsis Progress Note (Signed)
[facility] is monitoring this sepsis ?

## 2022-01-26 NOTE — H&P (Signed)
History and Physical    [name]:[id number] DOB: 1946-03-17 DOA: 01/26/2022  PCP: Merrilee Seashore, MD  Patient coming from: Home.  Chief Complaint: Upper back pain.  HPI: [name] is a 76 y.o. female with history of rheumatoid arthritis, diabetes mellitus type 2, hypertension presents to the ER with complaint of worsening pain in the upper back which radiates to the chest for the last 3 days.  Denies any incontinence of urine bowel or extremity weakness.  ED Course: In the ER patient was febrile tachycardic features concerning for sepsis.  Labs show elevated leukocytosis lactic acid.  Was empirically started on antibiotics for sepsis after blood cultures obtained.  MRI T-spine L-spine shows epidural fluid collection with concerning features for discitis/osteomyelitis around T8-T10.  Dr. Christella Noa on-call neurosurgery was consulted.  Patient admitted for further work-up.  COVID test negative.  CT angiogram of the chest was negative for any dissection.  Review of Systems: As per HPI, rest all negative.   Past Medical History:  Diagnosis Date   Arthritis    "knees, ankles, fingers" (07/06/2018)   Asthma    Chronic lower back pain    Dyspnea    with asthma attacks    Dysrhythmia    Family history of adverse reaction to anesthesia    my first cousin had difficulty waking up    Fibroid    ovarian   GERD (gastroesophageal reflux disease)    Hip osteoarthritis    Left   History of staph infection    in hospital for 11 days/ in 2007   Hyperlipidemia    Hypertension    Radiculopathy    Type II diabetes mellitus ([place])    Wears glasses     Past Surgical History:  Procedure Laterality Date   ANTERIOR CERVICAL DECOMP/DISCECTOMY FUSION N/A 12/11/2015   Procedure: ANTERIOR CERVICAL DECOMPRESSION/DISCECTOMY FUSION 2 LEVELS;  Surgeon: Phylliss Bob, MD;  Location: [place];  Service: Orthopedics;  Laterality: N/A;  Anterior cervical decompression fusion, cervical 6-7, cervical  7-thoracic 1 with instrumentation and allograft   BACK SURGERY     CARDIAC CATHETERIZATION  1998   CATARACT EXTRACTION W/ INTRAOCULAR LENS  IMPLANT, BILATERAL Bilateral 2015   Bil   COLONOSCOPY     CRYOABLATION  1988   "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"   INCISION AND DRAINAGE Bilateral 2007>   "cleaned staph out of shoulders"   [place] LAMINECTOMY/DECOMPRESSION MICRODISCECTOMY N/A 01/31/2018   Procedure: LAMINECTOMY AND FORAMINOTOMY LUMBAR TWO- LUMBAR THREE, LUMBAR THREE- LUMBAR FOUR, LUMBAR FOUR- LUMBAR FIVE ;  Surgeon: Newman Pies, MD;  Location: [place];  Service: Neurosurgery;  Laterality: N/A;   POSTERIOR CERVICAL LAMINECTOMY  ~ [place] Right 02/2003   THUMB FUSION Right 2010   right thumb   TONSILLECTOMY     TOTAL ABDOMINAL HYSTERECTOMY  1998   TAH,BSO   TOTAL HIP ARTHROPLASTY Right 05/18/2018   Procedure: TOTAL HIP ARTHROPLASTY ANTERIOR APPROACH;  Surgeon: Frederik Pear, MD;  Location: [place];  Service: Orthopedics;  Laterality: Right;   TOTAL HIP ARTHROPLASTY Left 07/06/2018   TOTAL HIP ARTHROPLASTY Left 07/06/2018   Procedure: LEFT TOTAL HIP ARTHROPLASTY ANTERIOR APPROACH;  Surgeon: Frederik Pear, MD;  Location: [place];  Service: Orthopedics;  Laterality: Left;  Needs RNFA   TUBAL LIGATION       reports that she quit smoking about 29 years ago.  Her smoking use included cigarettes. She has a 25.00 pack-year smoking history. She has never used smokeless tobacco. She reports that she does not currently use alcohol. She reports that she does not use drugs.  Allergies  Allergen Reactions   Ivp Dye [Iodinated Contrast Media] Nausea And Vomiting   Prednisone Other (See Comments)    reflux Other reaction(s): acid reflux Other reaction(s): acid reflux Other reaction(s): acid reflux    Family History  Problem Relation Age of Onset   Diabetes Sister    Diabetes  Brother    Diabetes Brother    Diabetes Paternal Uncle    Cancer Paternal Aunt        UTERINE   Hypertension Maternal Grandmother    Heart disease Maternal Grandmother    Colon cancer Neg Hx     Prior to Admission medications   Medication Sig Start Date End Date Taking? Authorizing Provider  acetaminophen (TYLENOL) 500 MG tablet Take 1,000 mg by mouth daily as needed for moderate pain or headache.   Yes [provider]  albuterol (ACCUNEB) 1.25 MG/3ML nebulizer solution Take 1 ampule by nebulization every 4 (four) hours as needed for wheezing. 02/06/21  Yes [provider]  amoxicillin (AMOXIL) 500 MG capsule Take 2,000 mg by mouth See admin instructions. 1 hour prior to dental appointment 02/28/20  Yes [provider]  brompheniramine-pseudoephedrine-DM 30-2-10 MG/5ML syrup Take 5 mLs by mouth 3 (three) times daily as needed (cough). 09/05/21  Yes [provider]  bumetanide (BUMEX) 1 MG tablet Take 1 mg by mouth daily. 07/16/20  Yes [provider]  cetirizine (ZYRTEC) 10 MG tablet Take 10 mg by mouth daily as needed for allergies (during Spring/Summer).   Yes [provider]  cholecalciferol (VITAMIN D3) 25 MCG (1000 UNIT) tablet Take 1,000 Units by mouth daily.   Yes [provider]  diclofenac (VOLTAREN) 75 MG EC tablet Take 1 tablet by mouth daily as needed for mild pain. 12/31/18  Yes [provider]  diltiazem (CARDIZEM CD) 180 MG 24 hr capsule Take 180 mg by mouth daily. 10/08/21  Yes [provider]  docusate sodium (COLACE) 100 MG capsule Take 100 mg by mouth daily as needed for mild constipation.   Yes [provider]  enalapril (VASOTEC) 20 MG tablet Take 20 mg by mouth 2 (two) times daily. 08/05/13  Yes [provider]  etanercept (ENBREL SURECLICK) 50 MG/ML injection Inject 50 mg into the skin every Thursday. 05/07/21  Yes [provider]  fluticasone (FLONASE) 50 MCG/ACT nasal  spray Place 1 spray into both nostrils daily.   Yes [provider]  folic acid (FOLVITE) 742 MCG tablet Take 800 mcg by mouth daily.   Yes [provider]  gabapentin (NEURONTIN) 300 MG capsule Take 300 mg by mouth at bedtime.   Yes [provider]  lovastatin (MEVACOR) 40 MG tablet Take 40 mg by mouth at bedtime.  04/10/14  Yes [provider]  meclizine (ANTIVERT) 12.5 MG tablet Take 12.5 mg by mouth 2 (two) times daily as needed for dizziness. 09/19/19  Yes [provider]  metFORMIN (GLUCOPHAGE-XR) 500 MG 24 hr tablet Take 500 mg by mouth in the morning and at bedtime. 11/11/17  Yes [provider]  methotrexate (RHEUMATREX) 2.5 MG tablet Take 12.5 mg by mouth every Sunday. 5 tabs   Yes [provider]  Multiple Vitamin (MULTIVITAMIN WITH MINERALS) TABS tablet Take 1 tablet by mouth daily.   Yes [provider]  omeprazole (PRILOSEC OTC) 20 MG tablet Take 20 mg by mouth daily as needed (heartburn).   Yes [provider]  sodium chloride (OCEAN) 0.65 % SOLN nasal spray Place 1-2 sprays into the nose 4 (four) times daily as needed for congestion.   Yes [provider]  Donnal Debar 200-62.5-25 MCG/ACT AEPB Take 1 puff by mouth daily. 10/13/21  Yes [provider]  Blood Glucose Monitoring Suppl (ONE TOUCH ULTRA 2) w/Device KIT Check blood sugar twice per week 06/14/19   [provider]  diclofenac Sodium (VOLTAREN) 1 % GEL Apply 4 g topically 4 (four) times daily. 01/25/22   Deno Etienne, DO  glucose blood test strip  02/14/19   [provider]  Lancets [facility] DELICA PLUS [id number]) MISC Use to check blood sugar 06/14/19   [provider]  methocarbamol (ROBAXIN) 500 MG tablet Take 1 tablet (500 mg total) by mouth 2 (two) times daily. Patient taking differently: Take 500 mg by mouth in the morning and at bedtime. 01/25/22   Deno Etienne, DO  [facility] ULTRA test strip  11/20/20    [provider]    Physical Exam: Constitutional: Moderately built and nourished. Vitals:   01/26/22 1300 01/26/22 1356 01/26/22 1400 01/26/22 1800  BP:   (!) 159/79 (!) 151/82  Pulse: (!) 129  (!) 125 (!) 123  Resp: (!) 29  (!) 34 (!) 32  Temp:  (!) 102.2 F (39 C)    TempSrc:  Rectal    SpO2: 97%   97%  Weight:      Height:       Eyes: Anicteric no pallor. ENMT: No discharge from the ears eyes nose and mouth. Neck: No mass felt.  No neck rigidity. Respiratory: No rhonchi or crepitations. Cardiovascular: S1-S2 heard. Abdomen: Soft nontender bowel sound present. Musculoskeletal: No edema. Skin: No rash. Neurologic: Alert awake oriented time place and person.  Moving all extremities 5 x 5.  No facial asymmetry tongue is midline. Psychiatric: Appears normal.  Normal affect.   Labs on Admission: I have personally reviewed following labs and imaging studies  CBC: Recent Labs  Lab 01/25/22 1536 01/26/22 1406  WBC 15.8* 25.8*  NEUTROABS  --  23.0*  HGB 13.2 13.0  HCT 40.9 40.5  MCV 85.0 84.7  PLT 327 381   Basic Metabolic Panel: Recent Labs  Lab 01/25/22 1536 01/26/22 1406  NA 140 137  K 3.5 3.0*  CL 104 100  CO2 25 25  GLUCOSE 128* 116*  BUN 14 19  CREATININE 0.72 0.94  CALCIUM 9.5 9.0   GFR: Estimated Creatinine Clearance: 40.9 mL/min (by C-G formula based on SCr of 0.94 mg/dL). Liver Function Tests: Recent Labs  Lab 01/25/22 1700 01/26/22 1406  AST 32 20  ALT 24 21  ALKPHOS 81 84  BILITOT 1.0 1.0  PROT 7.3 7.1  ALBUMIN 4.0 3.6   Recent Labs  Lab 01/25/22 1700 01/26/22 1406  LIPASE 30 26   No results for input(s): AMMONIA in the last 168 hours. Coagulation Profile: Recent Labs  Lab 01/26/22 1406  INR 1.1   Cardiac Enzymes: No results for input(s): CKTOTAL, CKMB, CKMBINDEX, TROPONINI in the last 168 hours. BNP (last 3 results) No results for input(s): PROBNP in the last 8760 hours. HbA1C: No results for input(s): HGBA1C in  the last 72 hours. CBG: Recent Labs  Lab 01/25/22 1849  GLUCAP 140*   Lipid Profile: No results for input(s): CHOL, HDL, LDLCALC, TRIG, CHOLHDL, LDLDIRECT in the last  72 hours. Thyroid Function Tests: No results for input(s): TSH, T4TOTAL, FREET4, T3FREE, THYROIDAB in the last 72 hours. Anemia Panel: No results for input(s): VITAMINB12, FOLATE, FERRITIN, TIBC, IRON, RETICCTPCT in the last 72 hours. Urine analysis:    Component Value Date/Time   COLORURINE STRAW (A) 06/24/2018 1119   APPEARANCEUR CLEAR 06/24/2018 1119   LABSPEC 1.005 06/24/2018 1119   PHURINE 7.0 06/24/2018 1119   GLUCOSEU NEGATIVE 06/24/2018 1119   HGBUR NEGATIVE 06/24/2018 1119   BILIRUBINUR NEGATIVE 06/24/2018 1119   KETONESUR NEGATIVE 06/24/2018 1119   PROTEINUR NEGATIVE 06/24/2018 1119   NITRITE NEGATIVE 06/24/2018 1119   LEUKOCYTESUR NEGATIVE 06/24/2018 1119   Sepsis Labs: _0 (procalcitonin:4,lacticidven:4) ) Recent Results (from the past 240 hour(s))  Resp Panel by RT-PCR (Flu A&B, Covid) Nasopharyngeal Swab     Status: None   Collection Time: 01/26/22  2:06 PM   Specimen: Nasopharyngeal Swab; Nasopharyngeal(NP) swabs in vial transport medium  Result Value Ref Range Status   SARS Coronavirus 2 by RT PCR NEGATIVE NEGATIVE Final    Comment: (NOTE) SARS-CoV-2 target nucleic acids are NOT DETECTED.  The SARS-CoV-2 RNA is generally detectable in upper respiratory specimens during the acute phase of infection. The lowest concentration of SARS-CoV-2 viral copies this assay can detect is 138 copies/mL. A negative result does not preclude SARS-Cov-2 infection and should not be used as the sole basis for treatment or other patient management decisions. A negative result may occur with  improper specimen collection/handling, submission of specimen other than nasopharyngeal swab, presence of viral mutation(s) within the areas targeted by this assay, and inadequate number of viral copies(<138  copies/mL). A negative result must be combined with clinical observations, patient history, and epidemiological information. The expected result is Negative.  Fact Sheet for Patients:  EntrepreneurPulse.com.au  Fact Sheet for Healthcare Providers:  IncredibleEmployment.be  This test is no t yet approved or cleared by the [place] FDA and  has been authorized for detection and/or diagnosis of SARS-CoV-2 by FDA under an Emergency Use Authorization (EUA). This EUA will remain  in effect (meaning this test can be used) for the duration of the COVID-19 declaration under Section 564(b)(1) of the Act, 21 U.S.C.section 360bbb-3(b)(1), unless the authorization is terminated  or revoked sooner.       Influenza A by PCR NEGATIVE NEGATIVE Final   Influenza B by PCR NEGATIVE NEGATIVE Final    Comment: (NOTE) The Xpert Xpress SARS-CoV-2/FLU/RSV plus assay is intended as an aid in the diagnosis of influenza from Nasopharyngeal swab specimens and should not be used as a sole basis for treatment. Nasal washings and aspirates are unacceptable for Xpert Xpress SARS-CoV-2/FLU/RSV testing.  Fact Sheet for Patients: EntrepreneurPulse.com.au  Fact Sheet for Healthcare Providers: IncredibleEmployment.be  This test is not yet approved or cleared by the [place] FDA and has been authorized for detection and/or diagnosis of SARS-CoV-2 by FDA under an Emergency Use Authorization (EUA). This EUA will remain in effect (meaning this test can be used) for the duration of the COVID-19 declaration under Section 564(b)(1) of the Act, 21 U.S.C. section 360bbb-3(b)(1), unless the authorization is terminated or revoked.  Performed at [facility], [place] [address], [place], [place] [address]      Radiological Exams on Admission: DG Chest 2 View  Result Date: 01/25/2022 CLINICAL DATA:  Chest pain. EXAM: CHEST - 2 VIEW  COMPARISON:  05/09/2018. FINDINGS: Cardiac silhouette is normal in size. No mediastinal or hilar masses or evidence of adenopathy. Clear lungs.  No  pleural effusion or pneumothorax. Skeletal structures are intact. IMPRESSION: No active cardiopulmonary disease. Electronically Signed   By: Lajean Manes M.D.   On: 01/25/2022 15:53   MR THORACIC SPINE W WO CONTRAST  Result Date: 01/26/2022 CLINICAL DATA:  Osteomyelitis, thoracic; Lumbar radiculopathy, infection suspected, positive xray/CT EXAM: MRI THORACIC AND LUMBAR SPINE WITHOUT AND WITH CONTRAST TECHNIQUE: Multiplanar and multiecho pulse sequences of the thoracic and lumbar spine were obtained without and with intravenous contrast. CONTRAST:  28m GADAVIST GADOBUTROL 1 MMOL/ML IV SOLN COMPARISON:  CT of the chest January 25, 2022. FINDINGS: MRI THORACIC SPINE FINDINGS Alignment: Mildly exaggerated thoracic kyphosis. No substantial sagittal subluxation. Vertebrae: Mild T8-T9 and to lesser extent T9-T10 T2 hyperintense signal within the disc spaces. Adjacent marrow signal change involving the T9 and to a lesser extent inferior T8 and superior T10 vertebral bodies, which is T1 and T2 hypointense and sclerotic on prior CT. Cord: Normal cord signal. Paraspinal and other soft tissues: Linear opacities within bilateral dependent lungs. Disc levels: Fairly extensive abnormal enhancement in the dorsal and ventral canal, compatible with epidural phlegmon. Dorsal enhancement extends from the cervicothoracic junction to thoracolumbar junction. Ventral enhancement extends from approximately T6-T7 to T10-T11. Enhancement measures up to 3 mm in thickness dorsally and 2 mm ventrally. No discrete nonenhancing component to suggest abscess. This is superimposed on multilevel degenerative change, including: Posterior disc bulges at T1-T2, T6-T7, and T12-L1 partially efface ventral CSF with mild canal stenosis at these levels. Posterior disc bulges and ligamentum flavum thickening at  T7-T8, T8-T9, T9-T10 and T10-T11 with resulting moderate canal stenosis at these levels. Multilevel facet arthropathy with moderate to severe foraminal stenosis bilaterally at T8-T9, T9-T10, and on the right at T10-T11, T11-T12 and T12-L1. MRI LUMBAR SPINE FINDINGS Segmentation: Transitional lumbosacral anatomy. Four non rib-bearing lumbar vertebral bodies. Sacralization of L5. Alignment:  Grade 1 anterolisthesis of L3 on L4. Vertebrae: Endplate signal changes about the L3-L4 disc, favored degenerative. No appreciable disc edema. Otherwise, no focal marrow edema in the lumbar spine. Conus medullaris: Extends to the inferior L1 level and appears normal. Paraspinal and other soft tissues: Right renal cysts. Disc levels: T12-L1: Left greater than right facet arthropathy. Resulting moderate to severe left and mild right foraminal stenosis. Patent canal. L1-L2: Broad disc bulge with severe right and moderate left facet arthropathy. Resulting severe bilateral foraminal stenosis. Mild canal stenosis. L2-L3: Severe right facet arthropathy. Prior posterior decompression. Severe right and mild left foraminal stenosis. Patent canal. L3-L4: Severe disc height loss with endplate signal changes. Grade 1 anterolisthesis. Broad disc bulge with endplate spurring. Resulting severe bilateral foraminal stenosis. Prior posterior decompression. Transverse narrowing of the canal with resulting mild to moderate canal stenosis. Narrowing of the right greater than left subarticular recesses. L4-L5: Posterior disc bulge. Left greater than right facet arthropathy. Resulting severe left and moderate right foraminal stenosis. Posterior decompression with patent canal. L5-S1: Transitional anatomy.  Patent canal and foramina. 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 1. Extensive epidural enhancement throughout the thoracic canal, compatible with epidural phlegmon that is detailed  above and measures up to 3 mm in thickness dorsally and 2 mm ventrally. 2. Marked sclerotic change of the T9 and surrounding T8 and T10 endplates with mild edema in the T8-T9 and T9-T10 disc spaces. Findings are suspicious for chronic discitis/osteomyelitis given the above findings. 3. When superimposed on degenerative change, there is multilevel moderate canal stenosis in the lower thoracic spine. Also, mild to moderate canal stenosis  at L3-L4 with degenerative disease and prior posterior decompression at this level. 4. Multilevel moderate to severe foraminal stenosis in the thoracic spine and multilevel severe foraminal stenosis in the lumbar spine, as detailed above. Electronically Signed   By: Margaretha Sheffield M.D.   On: 01/26/2022 17:43   MR Lumbar Spine W Wo Contrast  Result Date: 01/26/2022 CLINICAL DATA:  Osteomyelitis, thoracic; Lumbar radiculopathy, infection suspected, positive xray/CT EXAM: MRI THORACIC AND LUMBAR SPINE WITHOUT AND WITH CONTRAST TECHNIQUE: Multiplanar and multiecho pulse sequences of the thoracic and lumbar spine were obtained without and with intravenous contrast. CONTRAST:  72m GADAVIST GADOBUTROL 1 MMOL/ML IV SOLN COMPARISON:  CT of the chest January 25, 2022. FINDINGS: MRI THORACIC SPINE FINDINGS Alignment: Mildly exaggerated thoracic kyphosis. No substantial sagittal subluxation. Vertebrae: Mild T8-T9 and to lesser extent T9-T10 T2 hyperintense signal within the disc spaces. Adjacent marrow signal change involving the T9 and to a lesser extent inferior T8 and superior T10 vertebral bodies, which is T1 and T2 hypointense and sclerotic on prior CT. Cord: Normal cord signal. Paraspinal and other soft tissues: Linear opacities within bilateral dependent lungs. Disc levels: Fairly extensive abnormal enhancement in the dorsal and ventral canal, compatible with epidural phlegmon. Dorsal enhancement extends from the cervicothoracic junction to thoracolumbar junction. Ventral enhancement  extends from approximately T6-T7 to T10-T11. Enhancement measures up to 3 mm in thickness dorsally and 2 mm ventrally. No discrete nonenhancing component to suggest abscess. This is superimposed on multilevel degenerative change, including: Posterior disc bulges at T1-T2, T6-T7, and T12-L1 partially efface ventral CSF with mild canal stenosis at these levels. Posterior disc bulges and ligamentum flavum thickening at T7-T8, T8-T9, T9-T10 and T10-T11 with resulting moderate canal stenosis at these levels. Multilevel facet arthropathy with moderate to severe foraminal stenosis bilaterally at T8-T9, T9-T10, and on the right at T10-T11, T11-T12 and T12-L1. MRI LUMBAR SPINE FINDINGS Segmentation: Transitional lumbosacral anatomy. Four non rib-bearing lumbar vertebral bodies. Sacralization of L5. Alignment:  Grade 1 anterolisthesis of L3 on L4. Vertebrae: Endplate signal changes about the L3-L4 disc, favored degenerative. No appreciable disc edema. Otherwise, no focal marrow edema in the lumbar spine. Conus medullaris: Extends to the inferior L1 level and appears normal. Paraspinal and other soft tissues: Right renal cysts. Disc levels: T12-L1: Left greater than right facet arthropathy. Resulting moderate to severe left and mild right foraminal stenosis. Patent canal. L1-L2: Broad disc bulge with severe right and moderate left facet arthropathy. Resulting severe bilateral foraminal stenosis. Mild canal stenosis. L2-L3: Severe right facet arthropathy. Prior posterior decompression. Severe right and mild left foraminal stenosis. Patent canal. L3-L4: Severe disc height loss with endplate signal changes. Grade 1 anterolisthesis. Broad disc bulge with endplate spurring. Resulting severe bilateral foraminal stenosis. Prior posterior decompression. Transverse narrowing of the canal with resulting mild to moderate canal stenosis. Narrowing of the right greater than left subarticular recesses. L4-L5: Posterior disc bulge. Left  greater than right facet arthropathy. Resulting severe left and moderate right foraminal stenosis. Posterior decompression with patent canal. L5-S1: Transitional anatomy.  Patent canal and foramina. 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 1. Extensive epidural enhancement throughout the thoracic canal, compatible with epidural phlegmon that is detailed above and measures up to 3 mm in thickness dorsally and 2 mm ventrally. 2. Marked sclerotic change of the T9 and surrounding T8 and T10 endplates with mild edema in the T8-T9 and T9-T10 disc spaces. Findings are suspicious for chronic discitis/osteomyelitis given the above findings. 3.  When superimposed on degenerative change, there is multilevel moderate canal stenosis in the lower thoracic spine. Also, mild to moderate canal stenosis at L3-L4 with degenerative disease and prior posterior decompression at this level. 4. Multilevel moderate to severe foraminal stenosis in the thoracic spine and multilevel severe foraminal stenosis in the lumbar spine, as detailed above. Electronically Signed   By: Margaretha Sheffield M.D.   On: 01/26/2022 17:43   DG Chest Port 1 View  Result Date: 01/26/2022 CLINICAL DATA:  Will sepsis, central chest pain, back pain, shortness of breath EXAM: PORTABLE CHEST 1 VIEW COMPARISON:  Portable exam 1347 hours compared to 01/25/2022 FINDINGS: Upper normal size of cardiac silhouette. Atherosclerotic calcification and tortuosity of thoracic aorta. Lungs clear. No acute infiltrate, pleural effusion, or pneumothorax. Bones demineralized with evidence of prior cervicothoracic fusion and LEFT glenohumeral degenerative changes. IMPRESSION: No acute abnormalities. Aortic Atherosclerosis (ICD10-I70.0). Electronically Signed   By: Lavonia Dana M.D.   On: 01/26/2022 13:56   CT Angio Chest/Abd/Pel for Dissection W and/or Wo Contrast  Result Date: 01/25/2022 CLINICAL DATA:  Acute onset of  chest and back pain, initial encounter EXAM: CT ANGIOGRAPHY CHEST, ABDOMEN AND PELVIS TECHNIQUE: Non-contrast CT of the chest was initially obtained. Multidetector CT imaging through the chest, abdomen and pelvis was performed using the standard protocol during bolus administration of intravenous contrast. Multiplanar reconstructed images and MIPs were obtained and reviewed to evaluate the vascular anatomy. RADIATION DOSE REDUCTION: This exam was performed according to the departmental dose-optimization program which includes automated exposure control, adjustment of the mA and/or kV according to patient size and/or use of iterative reconstruction technique. CONTRAST:  114m OMNIPAQUE IOHEXOL 350 MG/ML SOLN patient was pre-medicated for known contrast allergy COMPARISON:  Chest x-ray from earlier in the same day. FINDINGS: CTA CHEST FINDINGS Cardiovascular: Atherosclerotic calcifications of the thoracic aorta are noted. No aneurysmal dilatation is seen. No findings of dissection are noted. Heart is not significantly enlarged. Mild coronary calcifications are seen. The pulmonary artery as visualized shows no evidence of filling defect to suggest pulmonary embolism. Mediastinum/Nodes: Thoracic inlet is within normal limits. Esophagus as visualized is within normal limits. No sizable hilar or mediastinal adenopathy is noted. Lungs/Pleura: Mild atelectatic changes are noted in the bases. No focal confluent infiltrate is seen. No sizable parenchymal nodule is noted. Minimal emphysematous changes are noted. Musculoskeletal: Postsurgical changes are noted at the cervicothoracic junction. No acute rib abnormality is noted. Degenerative changes of the thoracic spine are seen. Significant sclerosis of the T10 vertebral body is noted of uncertain significance. These changes are new from prior CT in 2007 as well as prior plain film from 2019. These changes may be simply related to underlying degenerative change. Review of the  MIP images confirms the above findings. CTA ABDOMEN AND PELVIS FINDINGS VASCULAR Aorta: Atherosclerotic calcifications are noted without aneurysmal dilatation or dissection. Celiac: Patent without evidence of aneurysm, dissection, vasculitis or significant stenosis. SMA: Patent without evidence of aneurysm, dissection, vasculitis or significant stenosis. 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 No focal stenosis is seen. IMA: Patent without evidence of aneurysm, dissection, vasculitis or significant stenosis. Inflow: Iliacs demonstrate atherosclerotic calcifications without aneurysmal dilatation or dissection. Veins: No specific venous abnormality is seen. Review of the MIP images confirms the above findings. NON-VASCULAR Hepatobiliary: Liver is mildly decreased in attenuation consistent with fatty infiltration. Gallbladder is within normal limits. Pancreas: Unremarkable. No pancreatic ductal dilatation or surrounding inflammatory changes. Spleen: Normal in size without focal abnormality. Adrenals/Urinary Tract: Adrenal glands are within  normal limits. Kidneys demonstrate renal cystic change on the right. No definitive renal calculi are seen. No obstructive changes are noted. The bladder is partially distended. Pelvis is somewhat obscured by scatter from bilateral hip replacements. Stomach/Bowel: Scattered diverticular change of the colon is noted without evidence of diverticulitis. The appendix is within normal limits. Small bowel and stomach are unremarkable. Small duodenal diverticulum is noted adjacent to the head of the pancreas. Lymphatic: No significant lymphadenopathy is seen. Reproductive: Status post hysterectomy. No adnexal masses. Other: No abdominal wall hernia or abnormality. No abdominopelvic ascites. 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 No acute bony abnormality is noted. Degenerative changes of lumbar spine are seen. Review of the MIP images confirms the above findings.  IMPRESSION: No evidence of aortic dissection or aneurysmal dilatation. No evidence of pulmonary emboli. Degenerative changes of the thoracic and lumbar spine as described. Mild fatty infiltration of the liver. Diverticulosis without diverticulitis. Aortic Atherosclerosis (ICD10-I70.0) and Emphysema (ICD10-J43.9). Electronically Signed   By: Inez Catalina M.D.   On: 01/25/2022 21:37    EKG: Independently reviewed.  Sinus tachycardia.  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 Christella Noa.  At this time since patient is septic antibiotics have been started.  Follow cultures.  IR consult for biopsy of T-spine lesions.  Will need infectious disease input.  Continue with hydration. Diabetes mellitus type 2 on insulin sliding scale coverage. Hypertension on enalapril and Cardizem.  Usually takes Bumex which will be on hold due to sepsis. History of rheumatoid arthritis usually takes Enbrel and methotrexate which will be on hold due to sepsis.  Since patient has sepsis with discitis of the thoracic spine with epidural phlegmon will need close monitoring for any further worsening inpatient status.   DVT prophylaxis: SCDs for now avoiding anticoagulant anticipation of T-spine biopsy. Code Status: Full code. Family Communication: Patient's daughter at the bedside. Disposition Plan: To be determined. Consults called: Neurosurgery and interventional radiology. Admission status: Inpatient   Rise Patience MD [facility] Pager [phone].  If 7PM-7AM, please contact night-coverage [email or web address] Password [facility]  01/26/2022, 11:28 PM

## 2022-01-26 NOTE — Progress Notes (Signed)
Pharmacy Antibiotic Note ? ?[name] is a 76 y.o. female admitted on 01/26/2022 with sepsis.  Pharmacy has been consulted for vancomycin and cefepime dosing. Patient presenting with chest pain and back pain. ? ?SCr 0.94 - slightly above baseline ?WBC 25.8 (received steroids in ED 3/5); LA 2.7; T 102.2 F; HR 125; RR 34 ?COVID/Flu negative ? ?Plan: ?Cefepime 2g q12hr ?Vancomycin 1500 mg once then 750 mg q24hr (eAUC 470) unless change in renal function ?Trend WBC, Fever, Renal function, & Clinical course ?F/u cultures, clinical course, WBC, fever ?De-escalate when able ? ?Height: '4\' 8"'$  (142.2 cm) ?Weight: 67.1 kg (148 lb) ?IBW/kg (Calculated) : 36.3 ? ?Temp (24hrs), Avg:100 ?F (37.8 ?C), Min:98.5 ?F (36.9 ?C), Max:102.2 ?F (39 ?C) ? ?Recent Labs  ?Lab 01/25/22 ?1536 01/26/22 ?1406  ?WBC 15.8* 25.8*  ?CREATININE 0.72  --   ?LATICACIDVEN  --  2.7*  ?  ?Estimated Creatinine Clearance: 48.1 mL/min (by C-G formula based on SCr of 0.72 mg/dL).   ? ?Allergies  ?Allergen Reactions  ? Ivp Dye [Iodinated Contrast Media] Nausea And Vomiting  ? Prednisone Other (See Comments)  ?  reflux ?Other reaction(s): acid reflux ?Other reaction(s): acid reflux ?Other reaction(s): acid reflux  ? ? ?Antimicrobials this admission: ?cefepime 3/6 >>  ?vancomycin 3/6 >>  ? ?Microbiology results: ?Pending ? ?Thank you for allowing pharmacy to be a part of this patient?s care. ? ?Lorelei Pont, PharmD, BCPS ?01/26/2022 3:24 PM ?ED Clinical Pharmacist -  [phone] ?  ?

## 2022-01-26 NOTE — ED Provider Notes (Signed)
[facility] DEPARTMENT Provider Note   CSN: [id number] Arrival date & time: 01/26/22  1234     History  Chief Complaint  Patient presents with   Back Pain   Chest Pain    [name] is a 76 y.o. female.  HPI 76 year old female presents with severe back pain.  This started yesterday morning and was not that bad but has progressively gotten worse.  She thought it might be reflux but medicine she took did not seem to help.  Seems to also have upper abdominal pain during this time.  She went to urgent care and then the ED yesterday where she had a work-up including a CT angiography of her chest/abdomen/pelvis.  No cause was found and she was discharged.  Today the pain is recurrent bad again.  She went to her PCP and at that point she was appearing confused.  The family member who is at the bedside states that she was having trouble speaking though he thought this might be because she is dehydrated more than confused.  However she said the year was 2003.  However now that she is in the emergency department he states that she is back to normal and talking normally.  Patient denies any weakness or numbness in her extremities or incontinence.  She has felt like she has had a fever along with some chills over the last 24 hours.  Home Medications Prior to Admission medications   Medication Sig Start Date End Date Taking? Authorizing Provider  acetaminophen (TYLENOL) 500 MG tablet Take 1,000 mg by mouth daily as needed for moderate pain or headache.    [provider]  albuterol (ACCUNEB) 1.25 MG/3ML nebulizer solution 3 ml as needed 02/06/21   [provider]  amoxicillin (AMOXIL) 500 MG capsule TAKE 4 CAPSULES BY MOUTH FOR ONE DOSE THEN TAKE 4 CAPSULES BY MOUTH PRIOR TO DENTAL PROCEDURE 02/28/20   [provider]  benzonatate (TESSALON) 200 MG capsule  02/13/21   [provider]  betamethasone dipropionate 0.05 % cream     [provider]  Blood Glucose Monitoring Suppl (ONE TOUCH ULTRA 2) w/Device KIT Check blood sugar twice per week 06/14/19   [provider]  brompheniramine-pseudoephedrine-DM 30-2-10 MG/5ML syrup Take 5 mLs by mouth 3 (three) times daily as needed. 09/05/21   [provider]  bumetanide (BUMEX) 1 MG tablet  07/16/20   [provider]  cephALEXin (KEFLEX) 500 MG capsule Take 500 mg by mouth 3 (three) times daily. 09/29/21   [provider]  cetirizine (ZYRTEC ALLERGY) 10 MG tablet     [provider]  cetirizine (ZYRTEC) 10 MG tablet Take 10 mg by mouth daily as needed for allergies (during Spring/Summer).    [provider]  chlorhexidine (PERIDEX) 0.12 % solution SMARTSIG:By Mouth 03/11/21   [provider]  cholecalciferol (VITAMIN D) 1000 units tablet Take 1,000 Units by mouth daily.    [provider]  cholecalciferol (VITAMIN D3) 25 MCG (1000 UNIT) tablet 1 capsule    [provider]  cholecalciferol (VITAMIN D3) 25 MCG (1000 UNIT) tablet     [provider]  clindamycin (CLEOCIN) 300 MG capsule SMARTSIG:2 Capsule(s) By Mouth 10/14/20   [provider]  diclofenac (VOLTAREN) 75 MG EC tablet Take 1 tablet by mouth 2 (two) times daily. 12/31/18   [provider]  diclofenac Sodium (VOLTAREN) 1 % GEL Apply 4 g topically 4 (four) times daily. 01/25/22  Deno Etienne, DO  diltiazem (CARTIA XT) 180 MG 24 hr capsule 1 capsule 10/08/21   [provider]  docusate sodium (COLACE) 100 MG capsule Take 100 mg by mouth every other day.    [provider]  enalapril (VASOTEC) 20 MG tablet Take 20 mg by mouth at bedtime.  08/05/13   [provider]  enalapril (VASOTEC) 20 MG tablet 1 tablet 08/26/20   [provider]  etanercept (ENBREL SURECLICK) 50 MG/ML injection See admin instructions. 05/07/21   [provider]  fluorometholone (FML) 0.1 % ophthalmic suspension  SMARTSIG:In Eye(s) 09/16/20   [provider]  Fluticasone-Salmeterol (ADVAIR DISKUS IN)     [provider]  Fluticasone-Salmeterol (ADVAIR DISKUS) 250-50 MCG/DOSE AEPB 1 puff    [provider]  folic acid (FOLVITE) 737 MCG tablet     [provider]  gabapentin (NEURONTIN) 300 MG capsule Take 300 mg by mouth daily.     [provider]  gabapentin (NEURONTIN) 300 MG capsule Take 1 capsule by mouth daily. 06/30/21   [provider]  glucose blood test strip  02/14/19   [provider]  hydrOXYzine (ATARAX) 10 MG tablet take 1 to 2 tablets 07/02/21   [provider]  hydrOXYzine (ATARAX/VISTARIL) 10 MG tablet Take 20 mg by mouth 2 (two) times daily. 07/02/21   [provider]  hydrOXYzine (ATARAX/VISTARIL) 25 MG tablet  10/30/19   [provider]  Lancets [facility] DELICA PLUS [id number]) [place] Use to check blood sugar 06/14/19   [provider]  lovastatin (MEVACOR) 40 MG tablet Take 40 mg by mouth at bedtime.  04/10/14   [provider]  meclizine (ANTIVERT) 12.5 MG tablet  09/19/19   [provider]  metFORMIN (GLUCOPHAGE-XR) 500 MG 24 hr tablet Take 500-1,000 mg by mouth See admin instructions. Take 1 tablet (500 mg) in the morning and take 2 tablets (1000 mg) with supper 11/11/17   [provider]  metFORMIN (GLUCOPHAGE-XR) 500 MG 24 hr tablet  01/14/21   [provider]  metFORMIN (GLUCOPHAGE-XR) 500 MG 24 hr tablet 1 tablet 01/14/21   [provider]  methocarbamol (ROBAXIN) 500 MG tablet Take 1 tablet (500 mg total) by mouth 2 (two) times daily. 01/25/22   Deno Etienne, DO  methotrexate (RHEUMATREX) 2.5 MG tablet Take 12.5 mg by mouth every Sunday at [place]. Protect from light.     [provider]  Multiple Vitamin (MULTI-VITAMIN DAILY PO) 1 tablet    [provider]  Multiple Vitamin (MULTI-VITAMIN PO)     [provider]  Multiple Vitamin (MULTIVITAMIN WITH MINERALS) TABS tablet Take 1 tablet by mouth daily.    [provider]  omeprazole (PRILOSEC OTC) 20 MG tablet 1 tablet 30 minutes before morning meal    [provider]  ONETOUCH ULTRA test strip  11/20/20   [provider]  oxyCODONE-acetaminophen (PERCOCET/ROXICET) 5-325 MG tablet Take 1 tablet by mouth every 4 (four) hours as needed. 03/11/21   [provider]  sodium chloride (OCEAN) 0.65 % SOLN nasal spray Place 1-2 sprays into the nose 4 (four) times daily as needed for congestion.    [provider]  Donnal Debar 200-62.5-25 MCG/ACT AEPB Take 1 puff by mouth daily. 10/13/21   [provider]  Turmeric Curcumin 500 MG CAPS See admin instructions.    [provider]  umeclidinium-vilanterol Jearl Klinefelter ELLIPTA) 62.5-25 MCG/INH AEPB  02/06/21   [provider]  Allergies    Ivp dye [iodinated contrast media] and Prednisone    Review of Systems   Review of Systems  Constitutional:  Positive for chills and fever.  Respiratory:  Negative for cough and shortness of breath.   Cardiovascular:  Positive for chest pain.  Gastrointestinal:  Positive for abdominal pain and vomiting.  Genitourinary:  Negative for dysuria.  Musculoskeletal:  Positive for back pain.  Neurological:  Negative for weakness, numbness and headaches.   Physical Exam Updated Vital Signs BP (!) 159/79    Pulse (!) 125    Temp (!) 102.2 F (39 C) (Rectal)    Resp (!) 34    Ht _0  (1.422 m)    Wt 67.1 kg    SpO2 97%    BMI 33.18 kg/m  Physical Exam Vitals and nursing note reviewed.  Constitutional:      Appearance: She is well-developed. She is not diaphoretic.  HENT:     Head: Normocephalic and atraumatic.  Cardiovascular:     Rate and Rhythm: Regular rhythm. Tachycardia present.     Heart sounds: Normal heart sounds.  Pulmonary:     Effort: Pulmonary effort is normal.     Breath  sounds: Normal breath sounds.  Abdominal:     Palpations: Abdomen is soft.     Tenderness: There is abdominal tenderness in the epigastric area.  Musculoskeletal:     Thoracic back: Bony tenderness present.     Lumbar back: Bony tenderness present.     Comments: Patient is point tender in the midline over thoracic and lumbar spine.  Worst in her mid thoracic area.  Skin:    General: Skin is warm and dry.  Neurological:     Mental Status: She is alert and oriented to person, place, and time.     Comments: Patient is alert and oriented to person, place, time, situation. CN 3-12 grossly intact. 5/5 strength in all 4 extremities. Grossly normal sensation.     ED Results / Procedures / Treatments   Labs (all labs ordered are listed, but only abnormal results are displayed) Labs Reviewed  LACTIC ACID, PLASMA - Abnormal; Notable for the following components:      Result Value   Lactic Acid, Venous 2.7 (*)    All other components within normal limits  COMPREHENSIVE METABOLIC PANEL - Abnormal; Notable for the following components:   Potassium 3.0 (*)    Glucose, Bld 116 (*)    All other components within normal limits  CBC WITH DIFFERENTIAL/PLATELET - Abnormal; Notable for the following components:   WBC 25.8 (*)    RDW 17.7 (*)    Neutro Abs 23.0 (*)    Lymphs Abs 0.6 (*)    Monocytes Absolute 1.9 (*)    Abs Immature Granulocytes 0.31 (*)    All other components within normal limits  APTT - Abnormal; Notable for the following components:   aPTT 22 (*)    All other components within normal limits  RESP PANEL BY RT-PCR (FLU A&B, COVID) [id number]  CULTURE, BLOOD (ROUTINE X 2)  CULTURE, BLOOD (ROUTINE X 2)  URINE CULTURE  PROTIME-INR  LIPASE, BLOOD  LACTIC ACID, PLASMA  URINALYSIS, ROUTINE W REFLEX MICROSCOPIC  TROPONIN I (HIGH SENSITIVITY)  TROPONIN I (HIGH SENSITIVITY)    EKG EKG Interpretation  Date/Time:  Monday January 26 2022 12:40:53 EST Ventricular Rate:  125 PR  Interval:  156 QRS Duration: 84 QT Interval:  310 QTC Calculation: 447 R Axis:   -55 Text  Interpretation: Sinus tachycardia LAE, consider biatrial enlargement Left anterior fascicular block Anterior infarct, old no significant change since yesterday. Confirmed by Sherwood Gambler [phone]) on 01/26/2022 1:04:44 PM  Radiology DG Chest 2 View  Result Date: 01/25/2022 CLINICAL DATA:  Chest pain. EXAM: CHEST - 2 VIEW COMPARISON:  05/09/2018. FINDINGS: Cardiac silhouette is normal in size. No mediastinal or hilar masses or evidence of adenopathy. Clear lungs.  No pleural effusion or pneumothorax. Skeletal structures are intact. IMPRESSION: No active cardiopulmonary disease. Electronically Signed   By: Lajean Manes M.D.   On: 01/25/2022 15:53   DG Chest Port 1 View  Result Date: 01/26/2022 CLINICAL DATA:  Will sepsis, central chest pain, back pain, shortness of breath EXAM: PORTABLE CHEST 1 VIEW COMPARISON:  Portable exam 1347 hours compared to 01/25/2022 FINDINGS: Upper normal size of cardiac silhouette. Atherosclerotic calcification and tortuosity of thoracic aorta. Lungs clear. No acute infiltrate, pleural effusion, or pneumothorax. Bones demineralized with evidence of prior cervicothoracic fusion and LEFT glenohumeral degenerative changes. IMPRESSION: No acute abnormalities. Aortic Atherosclerosis (ICD10-I70.0). Electronically Signed   By: Lavonia Dana M.D.   On: 01/26/2022 13:56   CT Angio Chest/Abd/Pel for Dissection W and/or Wo Contrast  Result Date: 01/25/2022 CLINICAL DATA:  Acute onset of chest and back pain, initial encounter EXAM: CT ANGIOGRAPHY CHEST, ABDOMEN AND PELVIS TECHNIQUE: Non-contrast CT of the chest was initially obtained. Multidetector CT imaging through the chest, abdomen and pelvis was performed using the standard protocol during bolus administration of intravenous contrast. Multiplanar reconstructed images and MIPs were obtained and reviewed to evaluate the vascular anatomy. RADIATION  DOSE REDUCTION: This exam was performed according to the departmental dose-optimization program which includes automated exposure control, adjustment of the mA and/or kV according to patient size and/or use of iterative reconstruction technique. CONTRAST:  149m OMNIPAQUE IOHEXOL 350 MG/ML SOLN patient was pre-medicated for known contrast allergy COMPARISON:  Chest x-ray from earlier in the same day. FINDINGS: CTA CHEST FINDINGS Cardiovascular: Atherosclerotic calcifications of the thoracic aorta are noted. No aneurysmal dilatation is seen. No findings of dissection are noted. Heart is not significantly enlarged. Mild coronary calcifications are seen. The pulmonary artery as visualized shows no evidence of filling defect to suggest pulmonary embolism. Mediastinum/Nodes: Thoracic inlet is within normal limits. Esophagus as visualized is within normal limits. No sizable hilar or mediastinal adenopathy is noted. Lungs/Pleura: Mild atelectatic changes are noted in the bases. No focal confluent infiltrate is seen. No sizable parenchymal nodule is noted. Minimal emphysematous changes are noted. Musculoskeletal: Postsurgical changes are noted at the cervicothoracic junction. No acute rib abnormality is noted. Degenerative changes of the thoracic spine are seen. Significant sclerosis of the T10 vertebral body is noted of uncertain significance. These changes are new from prior CT in 2007 as well as prior plain film from 2019. These changes may be simply related to underlying degenerative change. Review of the MIP images confirms the above findings. CTA ABDOMEN AND PELVIS FINDINGS VASCULAR Aorta: Atherosclerotic calcifications are noted without aneurysmal dilatation or dissection. Celiac: Patent without evidence of aneurysm, dissection, vasculitis or significant stenosis. SMA: Patent without evidence of aneurysm, dissection, vasculitis or significant stenosis. 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 No focal  stenosis is seen. IMA: Patent without evidence of aneurysm, dissection, vasculitis or significant stenosis. Inflow: Iliacs demonstrate atherosclerotic calcifications without aneurysmal dilatation or dissection. Veins: No specific venous abnormality is seen. Review of the MIP images confirms the above findings. NON-VASCULAR Hepatobiliary: Liver is mildly decreased in attenuation consistent with fatty infiltration. Gallbladder  is within normal limits. Pancreas: Unremarkable. No pancreatic ductal dilatation or surrounding inflammatory changes. Spleen: Normal in size without focal abnormality. Adrenals/Urinary Tract: Adrenal glands are within normal limits. Kidneys demonstrate renal cystic change on the right. No definitive renal calculi are seen. No obstructive changes are noted. The bladder is partially distended. Pelvis is somewhat obscured by scatter from bilateral hip replacements. Stomach/Bowel: Scattered diverticular change of the colon is noted without evidence of diverticulitis. The appendix is within normal limits. Small bowel and stomach are unremarkable. Small duodenal diverticulum is noted adjacent to the head of the pancreas. Lymphatic: No significant lymphadenopathy is seen. Reproductive: Status post hysterectomy. No adnexal masses. Other: No abdominal wall hernia or abnormality. No abdominopelvic ascites. 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 No acute bony abnormality is noted. Degenerative changes of lumbar spine are seen. Review of the MIP images confirms the above findings. IMPRESSION: No evidence of aortic dissection or aneurysmal dilatation. No evidence of pulmonary emboli. Degenerative changes of the thoracic and lumbar spine as described. Mild fatty infiltration of the liver. Diverticulosis without diverticulitis. Aortic Atherosclerosis (ICD10-I70.0) and Emphysema (ICD10-J43.9). Electronically Signed   By: Inez Catalina M.D.   On: 01/25/2022 21:37    Procedures .Critical  Care Performed by: Sherwood Gambler, MD Authorized by: Sherwood Gambler, MD   Critical care provider statement:    Critical care time (minutes):  35   Critical care time was exclusive of:  Separately billable procedures and treating other patients   Critical care was necessary to treat or prevent imminent or life-threatening deterioration of the following conditions:  Sepsis   Critical care was time spent personally by me on the following activities:  Development of treatment plan with patient or surrogate, discussions with consultants, evaluation of patient's response to treatment, examination of patient, ordering and review of laboratory studies, ordering and review of radiographic studies, ordering and performing treatments and interventions, pulse oximetry, re-evaluation of patient's condition and review of old charts    Medications Ordered in ED Medications  ceFEPIme (MAXIPIME) 2 g in sodium chloride 0.9 % 100 mL IVPB (has no administration in time range)  metroNIDAZOLE (FLAGYL) IVPB 500 mg (has no administration in time range)  lactated ringers bolus 1,000 mL (has no administration in time range)  vancomycin (VANCOREADY) IVPB 1500 mg/300 mL (has no administration in time range)  HYDROmorphone (DILAUDID) injection 0.5 mg (0.5 mg Intravenous Given 01/26/22 1418)  lactated ringers bolus 1,000 mL (1,000 mLs Intravenous New Bag/Given 01/26/22 1421)  acetaminophen (TYLENOL) tablet 650 mg (650 mg Oral Given 01/26/22 1418)    ED Course/ Medical Decision Making/ A&P Clinical Course as of 01/26/22 1629  Mon Jan 26, 2022  1500 Lactate has returned at 2.7.  Given this, will call code sepsis and start IV antibiotics.  Her primary area of pain is still her mid back so we will get MRI of T and L-spine.  We will start antibiotics.  These will be brought as a still unclear what is causing it.  Otherwise, I reviewed some images from yesterday CTA and there is some atelectasis but no clear pneumonia on her  lungs.  Abdominal CT was relatively benign. [SG]    Clinical Course User Index [SG] Sherwood Gambler, MD                           Medical Decision Making Problems Addressed: Sepsis, due to unspecified organism, unspecified whether acute organ dysfunction present [facility]): acute illness or injury  that poses a threat to life or bodily functions  Amount and/or Complexity of Data Reviewed Independent Historian:     Details: family (daughter, son) Labs: ordered. Radiology: ordered and independent interpretation performed. ECG/medicine tests: ordered and independent interpretation performed.  Risk OTC drugs. Prescription drug management.   Patient appears to have significant pain but she is otherwise awake and alert and not disoriented at this time.  Given this, I do not think that she needs an emergent head CT given no focal neurodeficits and no confusion now.  She might of been confused at the doctor's office from a fever, but with no altered mental status now I think brain emergency is less likely.  However, I am concerned that she might have a spinal cord infection.  CT results were reviewed from yesterday and were pretty benign.  I did personally look at the lung windows and there was some atelectasis but not a definitive pneumonia.  She has felt a little congested however. I personally viewed her CXR today and I see no obvious pneumonia  She was noted to have a temperature of 102.2 rectally and so sepsis work-up was started.  She was given broad IV antibiotics as her lactate is appeared elevated at 2.7 as above.  Not a clear source at this point but I am still worried about spinal cord pathology so she will be sent for MRI T and L-spine.  Otherwise her labs were reviewed and interpreted and show a significant leukocytosis of 25 which is higher than yesterday.  Kidney function is okay.  She did have some upper abdominal tenderness but her CT of her abdomen was okay yesterday so I do not think an  emergent repeat is needed right now.   At this point, while she has tachycardia and fever, she is not hypotensive.  She is maintaining her airway and oxygenation.  She will need admission but will need to wait on the MRI results.  Care transferred to Dr. Vanita Panda.        Final Clinical Impression(s) / ED Diagnoses Final diagnoses:  Sepsis, due to unspecified organism, unspecified whether acute organ dysfunction present [facility])    Rx / DC Orders ED Discharge Orders     None         Sherwood Gambler, MD 01/26/22 1654

## 2022-01-26 NOTE — ED Provider Notes (Signed)
8:05 PM ?Care of the patient assumed at signout.  I have previously reviewed her MRI, and discussed them with patient and her daughter at bedside.  Subsequently discussed patient case with her neurosurgeon, Dr. Christella Noa who will see the patient in consult.  Patient on my evaluation was moving all extremity spontaneously, and preserved sensation in her lower extremities, speaking clearly.  Patient was admitted to our internal medicine colleagues with neurosurgery following is consulting service. ?  ?Carmin Muskrat, MD ?01/26/22 2005 ? ?

## 2022-01-26 NOTE — Consult Note (Signed)
Reason for Consult:epidural mass Referring Physician: lyliana, [name] is an 76 y.o. female.  HPI: whom was in her usual state of health until this past Sunday while in church 3/5. She started to experience pain in her mid back while at service. The pain intensified, and she found it quite uncomfortable to walk. Upon arrival to the ED yesterday the record shows she felt the pain traveled from her back "through her" as she describes. She was afebrile at 98.5, described the pain at 1525 as excruciating.Was moving all extremities well. She was discharged from the ED after no cardiac, nor intraabdominal, nor intrathoracic etiology was identified.  [name] returned today febrile, wbc increased to 25k from 15k on 3/5. Her PCP evaluated her in the office and felt she was confused. No bowel and/or bladder problems. Hydration seems to have improved her physical exam and her cognition appears much improved. MRI finding of epidural irregularities led to consult. 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 Past Medical History:  Diagnosis Date   Arthritis    "knees, ankles, fingers" (07/06/2018)   Asthma    Chronic lower back pain    Dyspnea    with asthma attacks    Dysrhythmia    Family history of adverse reaction to anesthesia    my first cousin had difficulty waking up    Fibroid    ovarian   GERD (gastroesophageal reflux disease)    Hip osteoarthritis    Left   History of staph infection    in hospital for 11 days/ in 2007   Hyperlipidemia    Hypertension    Radiculopathy    Type II diabetes mellitus ([place])    Wears glasses     Past Surgical History:  Procedure Laterality Date   ANTERIOR CERVICAL DECOMP/DISCECTOMY FUSION N/A 12/11/2015   Procedure: ANTERIOR CERVICAL DECOMPRESSION/DISCECTOMY FUSION 2 LEVELS;  Surgeon: Phylliss Bob, MD;  Location: [facility];  Service: Orthopedics;  Laterality: N/A;  Anterior cervical decompression fusion, cervical 6-7, cervical 7-thoracic  1 with instrumentation and allograft   BACK SURGERY     CARDIAC CATHETERIZATION  1998   CATARACT EXTRACTION W/ INTRAOCULAR LENS  IMPLANT, BILATERAL Bilateral 2015   Bil   COLONOSCOPY     CRYOABLATION  1988   "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"   INCISION AND DRAINAGE Bilateral 2007>   "cleaned staph out of shoulders"   [place] LAMINECTOMY/DECOMPRESSION MICRODISCECTOMY N/A 01/31/2018   Procedure: LAMINECTOMY AND FORAMINOTOMY LUMBAR TWO- LUMBAR THREE, LUMBAR THREE- LUMBAR FOUR, LUMBAR FOUR- LUMBAR FIVE ;  Surgeon: Newman Pies, MD;  Location: [facility];  Service: Neurosurgery;  Laterality: N/A;   POSTERIOR CERVICAL LAMINECTOMY  ~ [place] Right 02/2003   THUMB FUSION Right 2010   right thumb   TONSILLECTOMY     TOTAL ABDOMINAL HYSTERECTOMY  1998   TAH,BSO   TOTAL HIP ARTHROPLASTY Right 05/18/2018   Procedure: TOTAL HIP ARTHROPLASTY ANTERIOR APPROACH;  Surgeon: Frederik Pear, MD;  Location: [facility];  Service: Orthopedics;  Laterality: Right;   TOTAL HIP ARTHROPLASTY Left 07/06/2018   TOTAL HIP ARTHROPLASTY Left 07/06/2018   Procedure: LEFT TOTAL HIP ARTHROPLASTY ANTERIOR APPROACH;  Surgeon: Frederik Pear, MD;  Location: [facility];  Service: Orthopedics;  Laterality: Left;  Needs RNFA   TUBAL LIGATION      Family History  Problem Relation Age of  Onset   Diabetes Sister    Diabetes Brother    Diabetes Brother    Diabetes Paternal Uncle    Cancer Paternal Aunt        UTERINE   Hypertension Maternal Grandmother    Heart disease Maternal Grandmother    Colon cancer Neg Hx     Social History:  reports that she quit smoking about 29 years ago. Her smoking use included cigarettes. She has a 25.00 pack-year smoking history. She has never used smokeless tobacco. She reports that she does not currently use alcohol. She reports that she does not use drugs.  Allergies:  Allergies   Allergen Reactions   Ivp Dye [Iodinated Contrast Media] Nausea And Vomiting   Prednisone Other (See Comments)    reflux Other reaction(s): acid reflux Other reaction(s): acid reflux Other reaction(s): acid reflux    Medications: I have reviewed the patient's current medications.  Results for orders placed or performed during the hospital encounter of 01/26/22 (from the past 48 hour(s))  Lactic acid, plasma     Status: Abnormal   Collection Time: 01/26/22  2:06 PM  Result Value Ref Range   Lactic Acid, Venous 2.7 (HH) 0.5 - 1.9 mmol/L    Comment: CRITICAL RESULT CALLED TO, READ BACK BY AND VERIFIED WITH: J.[facility] 01/26/2022 AT 1458 A.HUGHES Performed at [facility], [place] [address] [place], [place] [address]   Comprehensive metabolic panel     Status: Abnormal   Collection Time: 01/26/22  2:06 PM  Result Value Ref Range   Sodium 137 135 - 145 mmol/L   Potassium 3.0 (L) 3.5 - 5.1 mmol/L   Chloride 100 98 - 111 mmol/L   CO2 25 22 - 32 mmol/L   Glucose, Bld 116 (H) 70 - 99 mg/dL    Comment: Glucose reference range applies only to samples taken after fasting for at least 8 hours.   BUN 19 8 - 23 mg/dL   Creatinine, Ser 0.94 0.44 - 1.00 mg/dL   Calcium 9.0 8.9 - 10.3 mg/dL   Total Protein 7.1 6.5 - 8.1 g/dL   Albumin 3.6 3.5 - 5.0 g/dL   AST 20 15 - 41 U/L   ALT 21 0 - 44 U/L   Alkaline Phosphatase 84 38 - 126 U/L   Total Bilirubin 1.0 0.3 - 1.2 mg/dL   GFR, Estimated >60 >60 mL/min    Comment: (NOTE) Calculated using the CKD-EPI Creatinine Equation (2021)    Anion gap 12 5 - 15    Comment: Performed at [facility] [address], [place], [place] [address]  CBC WITH DIFFERENTIAL     Status: Abnormal   Collection Time: 01/26/22  2:06 PM  Result Value Ref Range   WBC 25.8 (H) 4.0 - 10.5 K/uL   RBC 4.78 3.87 - 5.11 MIL/uL   Hemoglobin 13.0 12.0 - 15.0 g/dL   HCT 40.5 36.0 - 46.0 %   MCV 84.7 80.0 - 100.0 fL   MCH 27.2 26.0 - 34.0 pg   MCHC 32.1 30.0 -  36.0 g/dL   RDW 17.7 (H) 11.5 - 15.5 %   Platelets 256 150 - 400 K/uL    Comment: REPEATED TO VERIFY   nRBC 0.0 0.0 - 0.2 %   Neutrophils Relative % 90 %   Neutro Abs 23.0 (H) 1.7 - 7.7 K/uL   Lymphocytes Relative 2 %   Lymphs Abs 0.6 (L) 0.7 - 4.0 K/uL   Monocytes Relative 7 %  Monocytes Absolute 1.9 (H) 0.1 - 1.0 K/uL   Eosinophils Relative 0 %   Eosinophils Absolute 0.0 0.0 - 0.5 K/uL   Basophils Relative 0 %   Basophils Absolute 0.0 0.0 - 0.1 K/uL   Immature Granulocytes 1 %   Abs Immature Granulocytes 0.31 (H) 0.00 - 0.07 K/uL    Comment: Performed at [facility] [address], [place], [place] [address]  Protime-INR     Status: None   Collection Time: 01/26/22  2:06 PM  Result Value Ref Range   Prothrombin Time 14.2 11.4 - 15.2 seconds   INR 1.1 0.8 - 1.2    Comment: (NOTE) INR goal varies based on device and disease states. Performed at [facility], [place] [address], [place], [place] [address]   APTT     Status: Abnormal   Collection Time: 01/26/22  2:06 PM  Result Value Ref Range   aPTT 22 (L) 24 - 36 seconds    Comment: Performed at [facility], [place] [address], [place], [place] [address]  Lipase, blood     Status: None   Collection Time: 01/26/22  2:06 PM  Result Value Ref Range   Lipase 26 11 - 51 U/L    Comment: Performed at [facility], [place] [address], [place],  [address]  Troponin I (High Sensitivity)     Status: None   Collection Time: 01/26/22  2:06 PM  Result Value Ref Range   Troponin I (High Sensitivity) 17 <18 ng/L    Comment: (NOTE) Elevated high sensitivity troponin I (hsTnI) values and significant  changes across serial measurements may suggest ACS but many other  chronic and acute conditions are known to elevate hsTnI results.  Refer to the "Links" section for chest pain algorithms and additional  guidance. Performed at [facility], [place] [address] [place],  [address]   Resp Panel by RT-PCR  (Flu A&B, Covid) Nasopharyngeal Swab     Status: None   Collection Time: 01/26/22  2:06 PM   Specimen: Nasopharyngeal Swab; Nasopharyngeal(NP) swabs in vial transport medium  Result Value Ref Range   SARS Coronavirus 2 by RT PCR NEGATIVE NEGATIVE    Comment: (NOTE) SARS-CoV-2 target nucleic acids are NOT DETECTED.  The SARS-CoV-2 RNA is generally detectable in upper respiratory specimens during the acute phase of infection. The lowest concentration of SARS-CoV-2 viral copies this assay can detect is 138 copies/mL. A negative result does not preclude SARS-Cov-2 infection and should not be used as the sole basis for treatment or other patient management decisions. A negative result may occur with  improper specimen collection/handling, submission of specimen other than nasopharyngeal swab, presence of viral mutation(s) within the areas targeted by this assay, and inadequate number of viral copies(<138 copies/mL). A negative result must be combined with clinical observations, patient history, and epidemiological information. The expected result is Negative.  Fact Sheet for Patients:  EntrepreneurPulse.com.au  Fact Sheet for Healthcare Providers:  IncredibleEmployment.be  This test is no t yet approved or cleared by the [place] FDA and  has been authorized for detection and/or diagnosis of SARS-CoV-2 by FDA under an Emergency Use Authorization (EUA). This EUA will remain  in effect (meaning this test can be used) for the duration of the COVID-19 declaration under Section 564(b)(1) of the Act, 21 U.S.C.section 360bbb-3(b)(1), unless the authorization is terminated  or revoked sooner.       Influenza A by PCR NEGATIVE NEGATIVE  Influenza B by PCR NEGATIVE NEGATIVE    Comment: (NOTE) The Xpert Xpress SARS-CoV-2/FLU/RSV plus assay is intended as an aid in the diagnosis of influenza from Nasopharyngeal swab specimens and should not be used as  a sole basis for treatment. Nasal washings and aspirates are unacceptable for Xpert Xpress SARS-CoV-2/FLU/RSV testing.  Fact Sheet for Patients: EntrepreneurPulse.com.au  Fact Sheet for Healthcare Providers: IncredibleEmployment.be  This test is not yet approved or cleared by the [place] FDA and has been authorized for detection and/or diagnosis of SARS-CoV-2 by FDA under an Emergency Use Authorization (EUA). This EUA will remain in effect (meaning this test can be used) for the duration of the COVID-19 declaration under Section 564(b)(1) of the Act, 21 U.S.C. section 360bbb-3(b)(1), unless the authorization is terminated or revoked.  Performed at [facility], [place] [address], [place], [place] [address]     DG Chest 2 View  Result Date: 01/25/2022 CLINICAL DATA:  Chest pain. EXAM: CHEST - 2 VIEW COMPARISON:  05/09/2018. FINDINGS: Cardiac silhouette is normal in size. No mediastinal or hilar masses or evidence of adenopathy. Clear lungs.  No pleural effusion or pneumothorax. Skeletal structures are intact. IMPRESSION: No active cardiopulmonary disease. Electronically Signed   By: Lajean Manes M.D.   On: 01/25/2022 15:53   MR THORACIC SPINE W WO CONTRAST  Result Date: 01/26/2022 CLINICAL DATA:  Osteomyelitis, thoracic; Lumbar radiculopathy, infection suspected, positive xray/CT EXAM: MRI THORACIC AND LUMBAR SPINE WITHOUT AND WITH CONTRAST TECHNIQUE: Multiplanar and multiecho pulse sequences of the thoracic and lumbar spine were obtained without and with intravenous contrast. CONTRAST:  10m GADAVIST GADOBUTROL 1 MMOL/ML IV SOLN COMPARISON:  CT of the chest January 25, 2022. FINDINGS: MRI THORACIC SPINE FINDINGS Alignment: Mildly exaggerated thoracic kyphosis. No substantial sagittal subluxation. Vertebrae: Mild T8-T9 and to lesser extent T9-T10 T2 hyperintense signal within the disc spaces. Adjacent marrow signal change involving the T9 and to a  lesser extent inferior T8 and superior T10 vertebral bodies, which is T1 and T2 hypointense and sclerotic on prior CT. Cord: Normal cord signal. Paraspinal and other soft tissues: Linear opacities within bilateral dependent lungs. Disc levels: Fairly extensive abnormal enhancement in the dorsal and ventral canal, compatible with epidural phlegmon. Dorsal enhancement extends from the cervicothoracic junction to thoracolumbar junction. Ventral enhancement extends from approximately T6-T7 to T10-T11. Enhancement measures up to 3 mm in thickness dorsally and 2 mm ventrally. No discrete nonenhancing component to suggest abscess. This is superimposed on multilevel degenerative change, including: Posterior disc bulges at T1-T2, T6-T7, and T12-L1 partially efface ventral CSF with mild canal stenosis at these levels. Posterior disc bulges and ligamentum flavum thickening at T7-T8, T8-T9, T9-T10 and T10-T11 with resulting moderate canal stenosis at these levels. Multilevel facet arthropathy with moderate to severe foraminal stenosis bilaterally at T8-T9, T9-T10, and on the right at T10-T11, T11-T12 and T12-L1. MRI LUMBAR SPINE FINDINGS Segmentation: Transitional lumbosacral anatomy. Four non rib-bearing lumbar vertebral bodies. Sacralization of L5. Alignment:  Grade 1 anterolisthesis of L3 on L4. Vertebrae: Endplate signal changes about the L3-L4 disc, favored degenerative. No appreciable disc edema. Otherwise, no focal marrow edema in the lumbar spine. Conus medullaris: Extends to the inferior L1 level and appears normal. Paraspinal and other soft tissues: Right renal cysts. Disc levels: T12-L1: Left greater than right facet arthropathy. Resulting moderate to severe left and mild right foraminal stenosis. Patent canal. L1-L2: Broad disc bulge with severe right and moderate left facet arthropathy. Resulting severe bilateral foraminal stenosis. Mild canal stenosis. L2-L3:  Severe right facet arthropathy. Prior posterior  decompression. Severe right and mild left foraminal stenosis. Patent canal. L3-L4: Severe disc height loss with endplate signal changes. Grade 1 anterolisthesis. Broad disc bulge with endplate spurring. Resulting severe bilateral foraminal stenosis. Prior posterior decompression. Transverse narrowing of the canal with resulting mild to moderate canal stenosis. Narrowing of the right greater than left subarticular recesses. L4-L5: Posterior disc bulge. Left greater than right facet arthropathy. Resulting severe left and moderate right foraminal stenosis. Posterior decompression with patent canal. L5-S1: Transitional anatomy.  Patent canal and foramina. 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 1. Extensive epidural enhancement throughout the thoracic canal, compatible with epidural phlegmon that is detailed above and measures up to 3 mm in thickness dorsally and 2 mm ventrally. 2. Marked sclerotic change of the T9 and surrounding T8 and T10 endplates with mild edema in the T8-T9 and T9-T10 disc spaces. Findings are suspicious for chronic discitis/osteomyelitis given the above findings. 3. When superimposed on degenerative change, there is multilevel moderate canal stenosis in the lower thoracic spine. Also, mild to moderate canal stenosis at L3-L4 with degenerative disease and prior posterior decompression at this level. 4. Multilevel moderate to severe foraminal stenosis in the thoracic spine and multilevel severe foraminal stenosis in the lumbar spine, as detailed above. Electronically Signed   By: Margaretha Sheffield M.D.   On: 01/26/2022 17:43   MR Lumbar Spine W Wo Contrast  Result Date: 01/26/2022 CLINICAL DATA:  Osteomyelitis, thoracic; Lumbar radiculopathy, infection suspected, positive xray/CT EXAM: MRI THORACIC AND LUMBAR SPINE WITHOUT AND WITH CONTRAST TECHNIQUE: Multiplanar and multiecho pulse sequences of the thoracic and lumbar spine were  obtained without and with intravenous contrast. CONTRAST:  107m GADAVIST GADOBUTROL 1 MMOL/ML IV SOLN COMPARISON:  CT of the chest January 25, 2022. FINDINGS: MRI THORACIC SPINE FINDINGS Alignment: Mildly exaggerated thoracic kyphosis. No substantial sagittal subluxation. Vertebrae: Mild T8-T9 and to lesser extent T9-T10 T2 hyperintense signal within the disc spaces. Adjacent marrow signal change involving the T9 and to a lesser extent inferior T8 and superior T10 vertebral bodies, which is T1 and T2 hypointense and sclerotic on prior CT. Cord: Normal cord signal. Paraspinal and other soft tissues: Linear opacities within bilateral dependent lungs. Disc levels: Fairly extensive abnormal enhancement in the dorsal and ventral canal, compatible with epidural phlegmon. Dorsal enhancement extends from the cervicothoracic junction to thoracolumbar junction. Ventral enhancement extends from approximately T6-T7 to T10-T11. Enhancement measures up to 3 mm in thickness dorsally and 2 mm ventrally. No discrete nonenhancing component to suggest abscess. This is superimposed on multilevel degenerative change, including: Posterior disc bulges at T1-T2, T6-T7, and T12-L1 partially efface ventral CSF with mild canal stenosis at these levels. Posterior disc bulges and ligamentum flavum thickening at T7-T8, T8-T9, T9-T10 and T10-T11 with resulting moderate canal stenosis at these levels. Multilevel facet arthropathy with moderate to severe foraminal stenosis bilaterally at T8-T9, T9-T10, and on the right at T10-T11, T11-T12 and T12-L1. MRI LUMBAR SPINE FINDINGS Segmentation: Transitional lumbosacral anatomy. Four non rib-bearing lumbar vertebral bodies. Sacralization of L5. Alignment:  Grade 1 anterolisthesis of L3 on L4. Vertebrae: Endplate signal changes about the L3-L4 disc, favored degenerative. No appreciable disc edema. Otherwise, no focal marrow edema in the lumbar spine. Conus medullaris: Extends to the inferior L1 level and  appears normal. Paraspinal and other soft tissues: Right renal cysts. Disc levels: T12-L1: Left greater than right facet arthropathy. Resulting moderate to severe left and mild right foraminal stenosis.  Patent canal. L1-L2: Broad disc bulge with severe right and moderate left facet arthropathy. Resulting severe bilateral foraminal stenosis. Mild canal stenosis. L2-L3: Severe right facet arthropathy. Prior posterior decompression. Severe right and mild left foraminal stenosis. Patent canal. L3-L4: Severe disc height loss with endplate signal changes. Grade 1 anterolisthesis. Broad disc bulge with endplate spurring. Resulting severe bilateral foraminal stenosis. Prior posterior decompression. Transverse narrowing of the canal with resulting mild to moderate canal stenosis. Narrowing of the right greater than left subarticular recesses. L4-L5: Posterior disc bulge. Left greater than right facet arthropathy. Resulting severe left and moderate right foraminal stenosis. Posterior decompression with patent canal. L5-S1: Transitional anatomy.  Patent canal and foramina. 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 1. Extensive epidural enhancement throughout the thoracic canal, compatible with epidural phlegmon that is detailed above and measures up to 3 mm in thickness dorsally and 2 mm ventrally. 2. Marked sclerotic change of the T9 and surrounding T8 and T10 endplates with mild edema in the T8-T9 and T9-T10 disc spaces. Findings are suspicious for chronic discitis/osteomyelitis given the above findings. 3. When superimposed on degenerative change, there is multilevel moderate canal stenosis in the lower thoracic spine. Also, mild to moderate canal stenosis at L3-L4 with degenerative disease and prior posterior decompression at this level. 4. Multilevel moderate to severe foraminal stenosis in the thoracic spine and multilevel severe foraminal stenosis in the  lumbar spine, as detailed above. Electronically Signed   By: Margaretha Sheffield M.D.   On: 01/26/2022 17:43   DG Chest Port 1 View  Result Date: 01/26/2022 CLINICAL DATA:  Will sepsis, central chest pain, back pain, shortness of breath EXAM: PORTABLE CHEST 1 VIEW COMPARISON:  Portable exam 1347 hours compared to 01/25/2022 FINDINGS: Upper normal size of cardiac silhouette. Atherosclerotic calcification and tortuosity of thoracic aorta. Lungs clear. No acute infiltrate, pleural effusion, or pneumothorax. Bones demineralized with evidence of prior cervicothoracic fusion and LEFT glenohumeral degenerative changes. IMPRESSION: No acute abnormalities. Aortic Atherosclerosis (ICD10-I70.0). Electronically Signed   By: Lavonia Dana M.D.   On: 01/26/2022 13:56   CT Angio Chest/Abd/Pel for Dissection W and/or Wo Contrast  Result Date: 01/25/2022 CLINICAL DATA:  Acute onset of chest and back pain, initial encounter EXAM: CT ANGIOGRAPHY CHEST, ABDOMEN AND PELVIS TECHNIQUE: Non-contrast CT of the chest was initially obtained. Multidetector CT imaging through the chest, abdomen and pelvis was performed using the standard protocol during bolus administration of intravenous contrast. Multiplanar reconstructed images and MIPs were obtained and reviewed to evaluate the vascular anatomy. RADIATION DOSE REDUCTION: This exam was performed according to the departmental dose-optimization program which includes automated exposure control, adjustment of the mA and/or kV according to patient size and/or use of iterative reconstruction technique. CONTRAST:  146m OMNIPAQUE IOHEXOL 350 MG/ML SOLN patient was pre-medicated for known contrast allergy COMPARISON:  Chest x-ray from earlier in the same day. FINDINGS: CTA CHEST FINDINGS Cardiovascular: Atherosclerotic calcifications of the thoracic aorta are noted. No aneurysmal dilatation is seen. No findings of dissection are noted. Heart is not significantly enlarged. Mild coronary  calcifications are seen. The pulmonary artery as visualized shows no evidence of filling defect to suggest pulmonary embolism. Mediastinum/Nodes: Thoracic inlet is within normal limits. Esophagus as visualized is within normal limits. No sizable hilar or mediastinal adenopathy is noted. Lungs/Pleura: Mild atelectatic changes are noted in the bases. No focal confluent infiltrate is seen. No sizable parenchymal nodule is noted. Minimal emphysematous changes are noted. Musculoskeletal: Postsurgical changes are  noted at the cervicothoracic junction. No acute rib abnormality is noted. Degenerative changes of the thoracic spine are seen. Significant sclerosis of the T10 vertebral body is noted of uncertain significance. These changes are new from prior CT in 2007 as well as prior plain film from 2019. These changes may be simply related to underlying degenerative change. Review of the MIP images confirms the above findings. CTA ABDOMEN AND PELVIS FINDINGS VASCULAR Aorta: Atherosclerotic calcifications are noted without aneurysmal dilatation or dissection. Celiac: Patent without evidence of aneurysm, dissection, vasculitis or significant stenosis. SMA: Patent without evidence of aneurysm, dissection, vasculitis or significant stenosis. 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 No focal stenosis is seen. IMA: Patent without evidence of aneurysm, dissection, vasculitis or significant stenosis. Inflow: Iliacs demonstrate atherosclerotic calcifications without aneurysmal dilatation or dissection. Veins: No specific venous abnormality is seen. Review of the MIP images confirms the above findings. NON-VASCULAR Hepatobiliary: Liver is mildly decreased in attenuation consistent with fatty infiltration. Gallbladder is within normal limits. Pancreas: Unremarkable. No pancreatic ductal dilatation or surrounding inflammatory changes. Spleen: Normal in size without focal abnormality. Adrenals/Urinary Tract: Adrenal glands are  within normal limits. Kidneys demonstrate renal cystic change on the right. No definitive renal calculi are seen. No obstructive changes are noted. The bladder is partially distended. Pelvis is somewhat obscured by scatter from bilateral hip replacements. Stomach/Bowel: Scattered diverticular change of the colon is noted without evidence of diverticulitis. The appendix is within normal limits. Small bowel and stomach are unremarkable. Small duodenal diverticulum is noted adjacent to the head of the pancreas. Lymphatic: No significant lymphadenopathy is seen. Reproductive: Status post hysterectomy. No adnexal masses. Other: No abdominal wall hernia or abnormality. No abdominopelvic ascites. 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 No acute bony abnormality is noted. Degenerative changes of lumbar spine are seen. Review of the MIP images confirms the above findings. IMPRESSION: No evidence of aortic dissection or aneurysmal dilatation. No evidence of pulmonary emboli. Degenerative changes of the thoracic and lumbar spine as described. Mild fatty infiltration of the liver. Diverticulosis without diverticulitis. Aortic Atherosclerosis (ICD10-I70.0) and Emphysema (ICD10-J43.9). Electronically Signed   By: Inez Catalina M.D.   On: 01/25/2022 21:37    Review of Systems  Constitutional:  Positive for fever.  HENT: Negative.    Eyes: Negative.   Respiratory: Negative.    Cardiovascular:  Positive for chest pain.  Gastrointestinal: Negative.   Endocrine: Negative.   Genitourinary: Negative.   Musculoskeletal:  Positive for back pain.  Skin: Negative.   Allergic/Immunologic: Negative.   Neurological: Negative.   Hematological: Negative.   Psychiatric/Behavioral: Negative.    Blood pressure (!) 151/82, pulse (!) 123, temperature (!) 102.2 F (39 C), temperature source Rectal, resp. rate (!) 32, height '4\' 8"'$  (1.422 m), weight 67.1 kg, SpO2 97 %. Physical Exam Constitutional:      General: She is  not in acute distress.    Appearance: She is well-developed. She is ill-appearing. She is not toxic-appearing.  HENT:     Head: Normocephalic and atraumatic.     Right Ear: External ear normal.     Left Ear: External ear normal.  Eyes:     Extraocular Movements: Extraocular movements intact.     Pupils: Pupils are equal, round, and reactive to light.  Cardiovascular:     Rate and Rhythm: Tachycardia present.  Abdominal:     General: Abdomen is flat.     Palpations: Abdomen is soft.  Musculoskeletal:        General: Normal range  of motion.     Cervical back: Normal range of motion and neck supple.  Skin:    General: Skin is warm.  Neurological:     Mental Status: She is alert and oriented to person, place, and time.     Cranial Nerves: Cranial nerves 2-12 are intact.     Sensory: Sensation is intact.     Motor: Motor function is intact.     Coordination: Coordination is intact.     Deep Tendon Reflexes: Reflexes are normal and symmetric. Babinski sign absent on the right side. Babinski sign absent on the left side.     Reflex Scores:      Tricep reflexes are 2+ on the right side and 2+ on the left side.      Bicep reflexes are 2+ on the right side and 2+ on the left side.      Brachioradialis reflexes are 2+ on the right side and 2+ on the left side.      Patellar reflexes are 2+ on the right side and 2+ on the left side.      Achilles reflexes are 2+ on the right side and 2+ on the left side. Psychiatric:        Mood and Affect: Mood normal.        Behavior: Behavior normal.        Thought Content: Thought content normal.        Judgment: Judgment normal.    Assessment/Plan: [name] is a 76 y.o. female With hx of lumbar decompression for stenosis. With apparent discitis thoracic spine. Cord signal is normal. No epidural abscess is seen, but epidural enchancement is visible on contrast scans. Presented with fever, elevated white count. Recommend IR for needle biopsy of  t8, or t9. To guide abx treatment. No indications for operative intervention or treatment at this time given normal exam.   Ashok Pall 01/26/2022, 9:21 PM

## 2022-01-26 NOTE — ED Notes (Signed)
Patient transported to MRI 

## 2022-01-26 NOTE — Sepsis Progress Note (Signed)
Per offgoing eRN, communication with bedside RN via secure chat regarding delay in blood cultures.  Per RN, patient difficult stick.  Antibiotics given prior to collection of blood cultures. ?

## 2022-01-26 NOTE — ED Triage Notes (Signed)
Pt presents to ED via EMS with c.o L side chest pain underneath breast and back pain. Was seen yesterday in ED for same and was d/c after normal workup. Family reports pt is usually independent and have been having mobility issues since pain started. EMS reports pt was tachycardiac with HR in the 130s. Administered NS en route. ?

## 2022-01-27 ENCOUNTER — Inpatient Hospital Stay (HOSPITAL_COMMUNITY): Payer: HMO

## 2022-01-27 ENCOUNTER — Encounter (HOSPITAL_COMMUNITY): Payer: Self-pay | Admitting: Internal Medicine

## 2022-01-27 DIAGNOSIS — M069 Rheumatoid arthritis, unspecified: Secondary | ICD-10-CM

## 2022-01-27 DIAGNOSIS — A419 Sepsis, unspecified organism: Secondary | ICD-10-CM

## 2022-01-27 DIAGNOSIS — M4644 Discitis, unspecified, thoracic region: Secondary | ICD-10-CM

## 2022-01-27 DIAGNOSIS — R509 Fever, unspecified: Secondary | ICD-10-CM | POA: Diagnosis not present

## 2022-01-27 DIAGNOSIS — J432 Centrilobular emphysema: Secondary | ICD-10-CM

## 2022-01-27 DIAGNOSIS — I10 Essential (primary) hypertension: Secondary | ICD-10-CM

## 2022-01-27 DIAGNOSIS — D849 Immunodeficiency, unspecified: Secondary | ICD-10-CM | POA: Diagnosis not present

## 2022-01-27 DIAGNOSIS — E1122 Type 2 diabetes mellitus with diabetic chronic kidney disease: Secondary | ICD-10-CM | POA: Diagnosis not present

## 2022-01-27 DIAGNOSIS — E785 Hyperlipidemia, unspecified: Secondary | ICD-10-CM | POA: Diagnosis present

## 2022-01-27 HISTORY — PX: IR FLUORO GUIDED NEEDLE PLC ASPIRATION/INJECTION LOC: IMG2395

## 2022-01-27 LAB — COMPREHENSIVE METABOLIC PANEL
ALT: 19 U/L (ref 0–44)
AST: 24 U/L (ref 15–41)
Albumin: 3.3 g/dL — ABNORMAL LOW (ref 3.5–5.0)
Alkaline Phosphatase: 94 U/L (ref 38–126)
Anion gap: 17 — ABNORMAL HIGH (ref 5–15)
BUN: 19 mg/dL (ref 8–23)
CO2: 17 mmol/L — ABNORMAL LOW (ref 22–32)
Calcium: 8.9 mg/dL (ref 8.9–10.3)
Chloride: 101 mmol/L (ref 98–111)
Creatinine, Ser: 0.87 mg/dL (ref 0.44–1.00)
GFR, Estimated: >60 mL/min (ref >60)
Glucose, Bld: 91 mg/dL (ref 70–99)
Potassium: 3.9 mmol/L (ref 3.5–5.1)
Sodium: 135 mmol/L (ref 135–145)
Total Bilirubin: 0.8 mg/dL (ref 0.3–1.2)
Total Protein: 7 g/dL (ref 6.5–8.1)

## 2022-01-27 LAB — GLUCOSE, CAPILLARY
Glucose-Capillary: 97 mg/dL (ref 70–99)
Glucose-Capillary: 106 mg/dL — ABNORMAL HIGH (ref 70–99)

## 2022-01-27 LAB — ECHOCARDIOGRAM COMPLETE
AR max vel: 2.35 cm2
AV Area VTI: 2.59 cm2
AV Area mean vel: 2.33 cm2
AV Mean grad: 5 mmHg
AV Peak grad: 10.9 mmHg
Ao pk vel: 1.65 m/s
Area-P 1/2: 3.3 cm2
Height: 56 in
MV VTI: 2.62 cm2
S' Lateral: 2.2 cm
Weight: 2368 oz

## 2022-01-27 LAB — URINALYSIS, ROUTINE W REFLEX MICROSCOPIC
Bacteria, UA: NONE SEEN
Bilirubin Urine: NEGATIVE
Glucose, UA: NEGATIVE mg/dL
Ketones, ur: NEGATIVE mg/dL
Nitrite: NEGATIVE
Protein, ur: NEGATIVE mg/dL
Specific Gravity, Urine: 1.02 (ref 1.005–1.030)
pH: 5 (ref 5.0–8.0)

## 2022-01-27 LAB — LACTIC ACID, PLASMA
Lactic Acid, Venous: 1 mmol/L (ref 0.5–1.9)
Lactic Acid, Venous: 1.2 mmol/L (ref 0.5–1.9)

## 2022-01-27 LAB — CBC
HCT: 39 % (ref 36.0–46.0)
Hemoglobin: 12.5 g/dL (ref 12.0–15.0)
MCH: 27.8 pg (ref 26.0–34.0)
MCHC: 32.1 g/dL (ref 30.0–36.0)
MCV: 86.9 fL (ref 80.0–100.0)
Platelets: 244 10*3/uL (ref 150–400)
RBC: 4.49 MIL/uL (ref 3.87–5.11)
RDW: 18.1 % — ABNORMAL HIGH (ref 11.5–15.5)
WBC: 25.7 10*3/uL — ABNORMAL HIGH (ref 4.0–10.5)
nRBC: 0 % (ref 0.0–0.2)

## 2022-01-27 LAB — TROPONIN I (HIGH SENSITIVITY)
Troponin I (High Sensitivity): 68 ng/L — ABNORMAL HIGH (ref <18)
Troponin I (High Sensitivity): 132 ng/L (ref <18)

## 2022-01-27 LAB — HEMOGLOBIN A1C
Hgb A1c MFr Bld: 5.4 % (ref 4.8–5.6)
Mean Plasma Glucose: 108.28 mg/dL

## 2022-01-27 LAB — CBG MONITORING, ED: Glucose-Capillary: 142 mg/dL — ABNORMAL HIGH (ref 70–99)

## 2022-01-27 MED ORDER — VANCOMYCIN HCL 500 MG/100ML IV SOLN
500.0000 mg | INTRAVENOUS | Status: DC
  Administered 2022-01-27 – 2022-01-29 (×3): 500 mg via INTRAVENOUS
  Filled 2022-01-27 (×4): qty 100

## 2022-01-27 MED ORDER — FENTANYL CITRATE (PF) 100 MCG/2ML IJ SOLN
INTRAMUSCULAR | Status: AC
  Filled 2022-01-27: qty 2

## 2022-01-27 MED ORDER — MIDAZOLAM HCL 2 MG/2ML IJ SOLN
INTRAMUSCULAR | Status: AC | PRN
  Administered 2022-01-27 (×2): 1 mg via INTRAVENOUS

## 2022-01-27 MED ORDER — MIDAZOLAM HCL 2 MG/2ML IJ SOLN
INTRAMUSCULAR | Status: AC
  Filled 2022-01-27: qty 2

## 2022-01-27 MED ORDER — HYDRALAZINE HCL 25 MG PO TABS
25.0000 mg | ORAL_TABLET | Freq: Three times a day (TID) | ORAL | Status: DC | PRN
  Administered 2022-01-27: 25 mg via ORAL
  Filled 2022-01-27: qty 1

## 2022-01-27 MED ORDER — ONDANSETRON HCL 4 MG/2ML IJ SOLN
INTRAMUSCULAR | Status: AC
  Administered 2022-01-27: 4 mg via INTRAVENOUS
  Filled 2022-01-27: qty 2

## 2022-01-27 MED ORDER — FENTANYL CITRATE (PF) 100 MCG/2ML IJ SOLN
INTRAMUSCULAR | Status: AC | PRN
  Administered 2022-01-27 (×3): 25 ug via INTRAVENOUS

## 2022-01-27 MED ORDER — METOPROLOL TARTRATE 5 MG/5ML IV SOLN
5.0000 mg | INTRAVENOUS | Status: DC | PRN
  Administered 2022-01-27: 5 mg via INTRAVENOUS
  Filled 2022-01-27: qty 5

## 2022-01-27 MED ORDER — CEFAZOLIN SODIUM-DEXTROSE 1-4 GM/50ML-% IV SOLN
1.0000 g | Freq: Once | INTRAVENOUS | Status: AC
  Administered 2022-01-27: 1 g via INTRAVENOUS

## 2022-01-27 MED ORDER — BUPIVACAINE HCL (PF) 0.5 % IJ SOLN
INTRAMUSCULAR | Status: AC
Start: 2022-01-27 — End: 2022-01-28
  Filled 2022-01-27: qty 30

## 2022-01-27 MED ORDER — CEFTRIAXONE SODIUM 2 G IJ SOLR
2.0000 g | INTRAMUSCULAR | Status: DC
  Administered 2022-01-27 – 2022-01-29 (×3): 2 g via INTRAVENOUS
  Filled 2022-01-27 (×3): qty 20

## 2022-01-27 MED ORDER — HYDROMORPHONE HCL 1 MG/ML IJ SOLN
0.5000 mg | INTRAMUSCULAR | Status: DC | PRN
  Administered 2022-01-27 – 2022-01-30 (×9): 0.5 mg via INTRAVENOUS
  Filled 2022-01-27 (×3): qty 0.5
  Filled 2022-01-27: qty 1
  Filled 2022-01-27 (×5): qty 0.5
  Filled 2022-01-27: qty 1

## 2022-01-27 MED ORDER — CEFAZOLIN SODIUM-DEXTROSE 2-4 GM/100ML-% IV SOLN
INTRAVENOUS | Status: AC
  Filled 2022-01-27: qty 100

## 2022-01-27 MED ORDER — ONDANSETRON HCL 4 MG/2ML IJ SOLN: 4.0000 mg | Freq: Four times a day (QID) | INTRAMUSCULAR | Status: DC | PRN

## 2022-01-27 MED ORDER — LIDOCAINE HCL 1 % IJ SOLN
INTRAMUSCULAR | Status: AC
  Filled 2022-01-27: qty 20

## 2022-01-27 NOTE — Progress Notes (Signed)
Providing Compassionate, Quality Care - Together   Subjective: Patient denies pain at present. She feels like she needs to urinate, but is having difficulty voiding while lying down. She reports her pain started Sunday in her back. She denies numbness, tingling, or weakness in her upper or lower extremities.  Objective: Vital signs in last 24 hours: Temp:  [98.4 F (36.9 C)-102.8 F (39.3 C)] 102.8 F (39.3 C) (03/07 0910) Pulse Rate:  [108-129] 127 (03/07 0736) Resp:  [17-34] 32 (03/07 0736) BP: (121-159)/(66-107) 140/85 (03/07 0736) SpO2:  [92 %-97 %] 92 % (03/07 0736) Weight:  [67.1 kg] 67.1 kg (03/06 1243)  Intake/Output from previous day: 03/06 0701 - 03/07 0700 In: 2000 [IV Piggyback:2000] Out: 300 [Urine:300] Intake/Output this shift: No intake/output data recorded.  Alert and oriented x 4 PERRLA Speech clear CN II-XII grossly intact MAE, Strength and sensation intact   Lab Results: Recent Labs    01/26/22 1406 01/27/22 0408  WBC 25.8* 25.7*  HGB 13.0 12.5  HCT 40.5 39.0  PLT 256 244   BMET Recent Labs    01/26/22 1406 01/27/22 0408  NA 137 135  K 3.0* 3.9  CL 100 101  CO2 25 17*  GLUCOSE 116* 91  BUN 19 19  CREATININE 0.94 0.87  CALCIUM 9.0 8.9    Studies/Results: DG Chest 2 View  Result Date: 01/25/2022 CLINICAL DATA:  Chest pain. EXAM: CHEST - 2 VIEW COMPARISON:  05/09/2018. FINDINGS: Cardiac silhouette is normal in size. No mediastinal or hilar masses or evidence of adenopathy. Clear lungs.  No pleural effusion or pneumothorax. Skeletal structures are intact. IMPRESSION: No active cardiopulmonary disease. Electronically Signed   By: Lajean Manes M.D.   On: 01/25/2022 15:53   MR THORACIC SPINE W WO CONTRAST  Result Date: 01/26/2022 CLINICAL DATA:  Osteomyelitis, thoracic; Lumbar radiculopathy, infection suspected, positive xray/CT EXAM: MRI THORACIC AND LUMBAR SPINE WITHOUT AND WITH CONTRAST TECHNIQUE: Multiplanar and multiecho pulse  sequences of the thoracic and lumbar spine were obtained without and with intravenous contrast. CONTRAST:  38m GADAVIST GADOBUTROL 1 MMOL/ML IV SOLN COMPARISON:  CT of the chest January 25, 2022. FINDINGS: MRI THORACIC SPINE FINDINGS Alignment: Mildly exaggerated thoracic kyphosis. No substantial sagittal subluxation. Vertebrae: Mild T8-T9 and to lesser extent T9-T10 T2 hyperintense signal within the disc spaces. Adjacent marrow signal change involving the T9 and to a lesser extent inferior T8 and superior T10 vertebral bodies, which is T1 and T2 hypointense and sclerotic on prior CT. Cord: Normal cord signal. Paraspinal and other soft tissues: Linear opacities within bilateral dependent lungs. Disc levels: Fairly extensive abnormal enhancement in the dorsal and ventral canal, compatible with epidural phlegmon. Dorsal enhancement extends from the cervicothoracic junction to thoracolumbar junction. Ventral enhancement extends from approximately T6-T7 to T10-T11. Enhancement measures up to 3 mm in thickness dorsally and 2 mm ventrally. No discrete nonenhancing component to suggest abscess. This is superimposed on multilevel degenerative change, including: Posterior disc bulges at T1-T2, T6-T7, and T12-L1 partially efface ventral CSF with mild canal stenosis at these levels. Posterior disc bulges and ligamentum flavum thickening at T7-T8, T8-T9, T9-T10 and T10-T11 with resulting moderate canal stenosis at these levels. Multilevel facet arthropathy with moderate to severe foraminal stenosis bilaterally at T8-T9, T9-T10, and on the right at T10-T11, T11-T12 and T12-L1. MRI LUMBAR SPINE FINDINGS Segmentation: Transitional lumbosacral anatomy. Four non rib-bearing lumbar vertebral bodies. Sacralization of L5. Alignment:  Grade 1 anterolisthesis of L3 on L4. Vertebrae: Endplate signal changes about the L3-L4 disc, favored  degenerative. No appreciable disc edema. Otherwise, no focal marrow edema in the lumbar spine. Conus  medullaris: Extends to the inferior L1 level and appears normal. Paraspinal and other soft tissues: Right renal cysts. Disc levels: T12-L1: Left greater than right facet arthropathy. Resulting moderate to severe left and mild right foraminal stenosis. Patent canal. L1-L2: Broad disc bulge with severe right and moderate left facet arthropathy. Resulting severe bilateral foraminal stenosis. Mild canal stenosis. L2-L3: Severe right facet arthropathy. Prior posterior decompression. Severe right and mild left foraminal stenosis. Patent canal. L3-L4: Severe disc height loss with endplate signal changes. Grade 1 anterolisthesis. Broad disc bulge with endplate spurring. Resulting severe bilateral foraminal stenosis. Prior posterior decompression. Transverse narrowing of the canal with resulting mild to moderate canal stenosis. Narrowing of the right greater than left subarticular recesses. L4-L5: Posterior disc bulge. Left greater than right facet arthropathy. Resulting severe left and moderate right foraminal stenosis. Posterior decompression with patent canal. L5-S1: Transitional anatomy.  Patent canal and foramina. 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 1. Extensive epidural enhancement throughout the thoracic canal, compatible with epidural phlegmon that is detailed above and measures up to 3 mm in thickness dorsally and 2 mm ventrally. 2. Marked sclerotic change of the T9 and surrounding T8 and T10 endplates with mild edema in the T8-T9 and T9-T10 disc spaces. Findings are suspicious for chronic discitis/osteomyelitis given the above findings. 3. When superimposed on degenerative change, there is multilevel moderate canal stenosis in the lower thoracic spine. Also, mild to moderate canal stenosis at L3-L4 with degenerative disease and prior posterior decompression at this level. 4. Multilevel moderate to severe foraminal stenosis in the thoracic spine  and multilevel severe foraminal stenosis in the lumbar spine, as detailed above. Electronically Signed   By: Margaretha Sheffield M.D.   On: 01/26/2022 17:43   MR Lumbar Spine W Wo Contrast  Result Date: 01/26/2022 CLINICAL DATA:  Osteomyelitis, thoracic; Lumbar radiculopathy, infection suspected, positive xray/CT EXAM: MRI THORACIC AND LUMBAR SPINE WITHOUT AND WITH CONTRAST TECHNIQUE: Multiplanar and multiecho pulse sequences of the thoracic and lumbar spine were obtained without and with intravenous contrast. CONTRAST:  25m GADAVIST GADOBUTROL 1 MMOL/ML IV SOLN COMPARISON:  CT of the chest January 25, 2022. FINDINGS: MRI THORACIC SPINE FINDINGS Alignment: Mildly exaggerated thoracic kyphosis. No substantial sagittal subluxation. Vertebrae: Mild T8-T9 and to lesser extent T9-T10 T2 hyperintense signal within the disc spaces. Adjacent marrow signal change involving the T9 and to a lesser extent inferior T8 and superior T10 vertebral bodies, which is T1 and T2 hypointense and sclerotic on prior CT. Cord: Normal cord signal. Paraspinal and other soft tissues: Linear opacities within bilateral dependent lungs. Disc levels: Fairly extensive abnormal enhancement in the dorsal and ventral canal, compatible with epidural phlegmon. Dorsal enhancement extends from the cervicothoracic junction to thoracolumbar junction. Ventral enhancement extends from approximately T6-T7 to T10-T11. Enhancement measures up to 3 mm in thickness dorsally and 2 mm ventrally. No discrete nonenhancing component to suggest abscess. This is superimposed on multilevel degenerative change, including: Posterior disc bulges at T1-T2, T6-T7, and T12-L1 partially efface ventral CSF with mild canal stenosis at these levels. Posterior disc bulges and ligamentum flavum thickening at T7-T8, T8-T9, T9-T10 and T10-T11 with resulting moderate canal stenosis at these levels. Multilevel facet arthropathy with moderate to severe foraminal stenosis bilaterally at  T8-T9, T9-T10, and on the right at T10-T11, T11-T12 and T12-L1. MRI LUMBAR SPINE FINDINGS Segmentation: Transitional lumbosacral anatomy. Four non rib-bearing lumbar  vertebral bodies. Sacralization of L5. Alignment:  Grade 1 anterolisthesis of L3 on L4. Vertebrae: Endplate signal changes about the L3-L4 disc, favored degenerative. No appreciable disc edema. Otherwise, no focal marrow edema in the lumbar spine. Conus medullaris: Extends to the inferior L1 level and appears normal. Paraspinal and other soft tissues: Right renal cysts. Disc levels: T12-L1: Left greater than right facet arthropathy. Resulting moderate to severe left and mild right foraminal stenosis. Patent canal. L1-L2: Broad disc bulge with severe right and moderate left facet arthropathy. Resulting severe bilateral foraminal stenosis. Mild canal stenosis. L2-L3: Severe right facet arthropathy. Prior posterior decompression. Severe right and mild left foraminal stenosis. Patent canal. L3-L4: Severe disc height loss with endplate signal changes. Grade 1 anterolisthesis. Broad disc bulge with endplate spurring. Resulting severe bilateral foraminal stenosis. Prior posterior decompression. Transverse narrowing of the canal with resulting mild to moderate canal stenosis. Narrowing of the right greater than left subarticular recesses. L4-L5: Posterior disc bulge. Left greater than right facet arthropathy. Resulting severe left and moderate right foraminal stenosis. Posterior decompression with patent canal. L5-S1: Transitional anatomy.  Patent canal and foramina. 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 1. Extensive epidural enhancement throughout the thoracic canal, compatible with epidural phlegmon that is detailed above and measures up to 3 mm in thickness dorsally and 2 mm ventrally. 2. Marked sclerotic change of the T9 and surrounding T8 and T10 endplates with mild edema in the T8-T9 and  T9-T10 disc spaces. Findings are suspicious for chronic discitis/osteomyelitis given the above findings. 3. When superimposed on degenerative change, there is multilevel moderate canal stenosis in the lower thoracic spine. Also, mild to moderate canal stenosis at L3-L4 with degenerative disease and prior posterior decompression at this level. 4. Multilevel moderate to severe foraminal stenosis in the thoracic spine and multilevel severe foraminal stenosis in the lumbar spine, as detailed above. Electronically Signed   By: Margaretha Sheffield M.D.   On: 01/26/2022 17:43   DG Chest Port 1 View  Result Date: 01/26/2022 CLINICAL DATA:  Will sepsis, central chest pain, back pain, shortness of breath EXAM: PORTABLE CHEST 1 VIEW COMPARISON:  Portable exam 1347 hours compared to 01/25/2022 FINDINGS: Upper normal size of cardiac silhouette. Atherosclerotic calcification and tortuosity of thoracic aorta. Lungs clear. No acute infiltrate, pleural effusion, or pneumothorax. Bones demineralized with evidence of prior cervicothoracic fusion and LEFT glenohumeral degenerative changes. IMPRESSION: No acute abnormalities. Aortic Atherosclerosis (ICD10-I70.0). Electronically Signed   By: Lavonia Dana M.D.   On: 01/26/2022 13:56   CT Angio Chest/Abd/Pel for Dissection W and/or Wo Contrast  Result Date: 01/25/2022 CLINICAL DATA:  Acute onset of chest and back pain, initial encounter EXAM: CT ANGIOGRAPHY CHEST, ABDOMEN AND PELVIS TECHNIQUE: Non-contrast CT of the chest was initially obtained. Multidetector CT imaging through the chest, abdomen and pelvis was performed using the standard protocol during bolus administration of intravenous contrast. Multiplanar reconstructed images and MIPs were obtained and reviewed to evaluate the vascular anatomy. RADIATION DOSE REDUCTION: This exam was performed according to the departmental dose-optimization program which includes automated exposure control, adjustment of the mA and/or kV  according to patient size and/or use of iterative reconstruction technique. CONTRAST:  175m OMNIPAQUE IOHEXOL 350 MG/ML SOLN patient was pre-medicated for known contrast allergy COMPARISON:  Chest x-ray from earlier in the same day. FINDINGS: CTA CHEST FINDINGS Cardiovascular: Atherosclerotic calcifications of the thoracic aorta are noted. No aneurysmal dilatation is seen. No findings of dissection are noted. Heart is  not significantly enlarged. Mild coronary calcifications are seen. The pulmonary artery as visualized shows no evidence of filling defect to suggest pulmonary embolism. Mediastinum/Nodes: Thoracic inlet is within normal limits. Esophagus as visualized is within normal limits. No sizable hilar or mediastinal adenopathy is noted. Lungs/Pleura: Mild atelectatic changes are noted in the bases. No focal confluent infiltrate is seen. No sizable parenchymal nodule is noted. Minimal emphysematous changes are noted. Musculoskeletal: Postsurgical changes are noted at the cervicothoracic junction. No acute rib abnormality is noted. Degenerative changes of the thoracic spine are seen. Significant sclerosis of the T10 vertebral body is noted of uncertain significance. These changes are new from prior CT in 2007 as well as prior plain film from 2019. These changes may be simply related to underlying degenerative change. Review of the MIP images confirms the above findings. CTA ABDOMEN AND PELVIS FINDINGS VASCULAR Aorta: Atherosclerotic calcifications are noted without aneurysmal dilatation or dissection. Celiac: Patent without evidence of aneurysm, dissection, vasculitis or significant stenosis. SMA: Patent without evidence of aneurysm, dissection, vasculitis or significant stenosis. 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 No focal stenosis is seen. IMA: Patent without evidence of aneurysm, dissection, vasculitis or significant stenosis. Inflow: Iliacs demonstrate atherosclerotic calcifications without  aneurysmal dilatation or dissection. Veins: No specific venous abnormality is seen. Review of the MIP images confirms the above findings. NON-VASCULAR Hepatobiliary: Liver is mildly decreased in attenuation consistent with fatty infiltration. Gallbladder is within normal limits. Pancreas: Unremarkable. No pancreatic ductal dilatation or surrounding inflammatory changes. Spleen: Normal in size without focal abnormality. Adrenals/Urinary Tract: Adrenal glands are within normal limits. Kidneys demonstrate renal cystic change on the right. No definitive renal calculi are seen. No obstructive changes are noted. The bladder is partially distended. Pelvis is somewhat obscured by scatter from bilateral hip replacements. Stomach/Bowel: Scattered diverticular change of the colon is noted without evidence of diverticulitis. The appendix is within normal limits. Small bowel and stomach are unremarkable. Small duodenal diverticulum is noted adjacent to the head of the pancreas. Lymphatic: No significant lymphadenopathy is seen. Reproductive: Status post hysterectomy. No adnexal masses. Other: No abdominal wall hernia or abnormality. No abdominopelvic ascites. 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 No acute bony abnormality is noted. Degenerative changes of lumbar spine are seen. Review of the MIP images confirms the above findings. IMPRESSION: No evidence of aortic dissection or aneurysmal dilatation. No evidence of pulmonary emboli. Degenerative changes of the thoracic and lumbar spine as described. Mild fatty infiltration of the liver. Diverticulosis without diverticulitis. Aortic Atherosclerosis (ICD10-I70.0) and Emphysema (ICD10-J43.9). Electronically Signed   By: Inez Catalina M.D.   On: 01/25/2022 21:37    Assessment/Plan: Patient is a patient of Dr. Arnoldo Morale who underwent lumbar decompressive surgery 4 years ago. She is on immunosuppression for RA and takes medication to manage type 2 DM. Last A1C was 5.3  in 2019. She developed a significant increase in back pain over the weekend and presented to the ED on 01/26/2022. Work up with thoracic and lumbar MRI revealed extensive epidural enhancement of the thoracic canal compatible with epidural phlegmon. There were also sclerotic changes at T8, T9, and T10, with mild edema suspicious for discitis/osteomyelitis and mild/moderate lumbar stenosis and moderate to severe foraminal stenosis of her thoracic spine.   LOS: 1 day   -IR for aspiration -ABX per ID -Neurosurgery will continue to follow.   Viona Gilmore, DNP, AGNP-C Nurse Practitioner  [facility] [address], [place], [place], [place] [address] P: [phone]  F: [phone]  01/27/2022, 11:21 AM

## 2022-01-27 NOTE — ED Notes (Signed)
Bladder scan 241 ?

## 2022-01-27 NOTE — Assessment & Plan Note (Addendum)
Most recent Hb A1c in EMR 5.3 in 2019. ?--SSI for coverage. ?Hb A1c 5.3 ? ?

## 2022-01-27 NOTE — Consult Note (Addendum)
Chief Complaint: Patient was seen in consultation today for epidural abscess; biopsy at T8 and T9. Chief Complaint  Patient presents with   Back Pain   Chest Pain   at the request of Gean Birchwood   Referring Physician(s): Gean Birchwood   Supervising Physician: Pedro Earls  Patient Status: [facility] - ED  History of Present Illness: [name] is a 76 y.o. female with PMHs of RA, DM, HTN who presented to the ED on 01/26/22 with worsening upper back pain that radiates to her chest X 3 day.  Found to be tachycardic and febrile with leukocystosis and elevated lactic acid.   MR Lumbar thoracic spine from 01/26/22 reads "marked sclerotic change of the T9 and surrounding T8 and T10 endplates with mild edema in the T8-T9 and T9-T10 disc spaces. Findings are suspicious for chronic discitis/osteomyelitis given the above findings."  NIR was requested for epidural abscess for biopsy at T8 and T9. Case was reviewed and approved for T 9 core biopsy and T8-9 disc aspiration by Dr. Katherina Right Rodigues.   Patient seen in ED room.  Patient laying in bed, not in acute distress. Daughter at bedside.  Reports shortness of breath and back pain today.  Denise headache, fever, chills, cough, chest pain, abdominal pain, nausea ,vomiting, and bleeding. Daughter confirms that the patient has not eaten anything since 9 pm yesterday, also confirms that the patient is not on AC/AP.   Past Medical History:  Diagnosis Date   Arthritis    "knees, ankles, fingers" (07/06/2018)   Asthma    Chronic lower back pain    Dyspnea    with asthma attacks    Dysrhythmia    Family history of adverse reaction to anesthesia    my first cousin had difficulty waking up    Fibroid    ovarian   GERD (gastroesophageal reflux disease)    Hip osteoarthritis    Left   History of staph infection    in hospital for 11 days/ in 2007   Hyperlipidemia    Hypertension    Radiculopathy    Type II  diabetes mellitus ([place])    Wears glasses     Past Surgical History:  Procedure Laterality Date   ANTERIOR CERVICAL DECOMP/DISCECTOMY FUSION N/A 12/11/2015   Procedure: ANTERIOR CERVICAL DECOMPRESSION/DISCECTOMY FUSION 2 LEVELS;  Surgeon: Phylliss Bob, MD;  Location: [place];  Service: Orthopedics;  Laterality: N/A;  Anterior cervical decompression fusion, cervical 6-7, cervical 7-thoracic 1 with instrumentation and allograft   BACK SURGERY     CARDIAC CATHETERIZATION  1998   CATARACT EXTRACTION W/ INTRAOCULAR LENS  IMPLANT, BILATERAL Bilateral 2015   Bil   COLONOSCOPY     CRYOABLATION  1988   "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"   INCISION AND DRAINAGE Bilateral 2007>   "cleaned staph out of shoulders"   [place] LAMINECTOMY/DECOMPRESSION MICRODISCECTOMY N/A 01/31/2018   Procedure: LAMINECTOMY AND FORAMINOTOMY LUMBAR TWO- LUMBAR THREE, LUMBAR THREE- LUMBAR FOUR, LUMBAR FOUR- LUMBAR FIVE ;  Surgeon: Newman Pies, MD;  Location: [place];  Service: Neurosurgery;  Laterality: N/A;   POSTERIOR CERVICAL LAMINECTOMY  ~ [place] Right 02/2003   THUMB FUSION Right 2010   right thumb   TONSILLECTOMY     TOTAL ABDOMINAL HYSTERECTOMY  1998   TAH,BSO   TOTAL HIP ARTHROPLASTY Right 05/18/2018  Procedure: TOTAL HIP ARTHROPLASTY ANTERIOR APPROACH;  Surgeon: Frederik Pear, MD;  Location: [place];  Service: Orthopedics;  Laterality: Right;   TOTAL HIP ARTHROPLASTY Left 07/06/2018   TOTAL HIP ARTHROPLASTY Left 07/06/2018   Procedure: LEFT TOTAL HIP ARTHROPLASTY ANTERIOR APPROACH;  Surgeon: Frederik Pear, MD;  Location: [place];  Service: Orthopedics;  Laterality: Left;  Needs RNFA   TUBAL LIGATION      Allergies: Ivp dye [iodinated contrast media] and Prednisone  Medications: Prior to Admission medications   Medication Sig Start Date End Date Taking? Authorizing Provider  acetaminophen  (TYLENOL) 500 MG tablet Take 1,000 mg by mouth daily as needed for moderate pain or headache.   Yes [provider]  albuterol (ACCUNEB) 1.25 MG/3ML nebulizer solution Take 1 ampule by nebulization every 4 (four) hours as needed for wheezing. 02/06/21  Yes [provider]  amoxicillin (AMOXIL) 500 MG capsule Take 2,000 mg by mouth See admin instructions. 1 hour prior to dental appointment 02/28/20  Yes [provider]  brompheniramine-pseudoephedrine-DM 30-2-10 MG/5ML syrup Take 5 mLs by mouth 3 (three) times daily as needed (cough). 09/05/21  Yes [provider]  bumetanide (BUMEX) 1 MG tablet Take 1 mg by mouth daily. 07/16/20  Yes [provider]  cetirizine (ZYRTEC) 10 MG tablet Take 10 mg by mouth daily as needed for allergies (during Spring/Summer).   Yes [provider]  cholecalciferol (VITAMIN D3) 25 MCG (1000 UNIT) tablet Take 1,000 Units by mouth daily.   Yes [provider]  diclofenac (VOLTAREN) 75 MG EC tablet Take 1 tablet by mouth daily as needed for mild pain. 12/31/18  Yes [provider]  diltiazem (CARDIZEM CD) 180 MG 24 hr capsule Take 180 mg by mouth daily. 10/08/21  Yes [provider]  docusate sodium (COLACE) 100 MG capsule Take 100 mg by mouth daily as needed for mild constipation.   Yes [provider]  enalapril (VASOTEC) 20 MG tablet Take 20 mg by mouth 2 (two) times daily. 08/05/13  Yes [provider]  etanercept (ENBREL SURECLICK) 50 MG/ML injection Inject 50 mg into the skin every Thursday. 05/07/21  Yes [provider]  fluticasone (FLONASE) 50 MCG/ACT nasal spray Place 1 spray into both nostrils daily.   Yes [provider]  folic acid (FOLVITE) 798 MCG tablet Take 800 mcg by mouth daily.   Yes [provider]  gabapentin (NEURONTIN) 300 MG capsule Take 300 mg by mouth at bedtime.   Yes [provider]  lovastatin (MEVACOR) 40 MG tablet Take  40 mg by mouth at bedtime.  04/10/14  Yes [provider]  meclizine (ANTIVERT) 12.5 MG tablet Take 12.5 mg by mouth 2 (two) times daily as needed for dizziness. 09/19/19  Yes [provider]  metFORMIN (GLUCOPHAGE-XR) 500 MG 24 hr tablet Take 500 mg by mouth in the morning and at bedtime. 11/11/17  Yes [provider]  methotrexate (RHEUMATREX) 2.5 MG tablet Take 12.5 mg by mouth every Sunday. 5 tabs   Yes [provider]  Multiple Vitamin (MULTIVITAMIN WITH MINERALS) TABS tablet Take 1 tablet by mouth daily.   Yes [provider]  omeprazole (PRILOSEC OTC) 20 MG tablet Take 20 mg by mouth daily as needed (heartburn).   Yes [provider]  sodium chloride (OCEAN) 0.65 % SOLN nasal spray Place 1-2 sprays into the nose 4 (four) times daily as needed for congestion.   Yes [provider]  Donnal Debar 200-62.5-25 MCG/ACT AEPB Take 1  puff by mouth daily. 10/13/21  Yes [provider]  Blood Glucose Monitoring Suppl (ONE TOUCH ULTRA 2) w/Device KIT Check blood sugar twice per week 06/14/19   [provider]  diclofenac Sodium (VOLTAREN) 1 % GEL Apply 4 g topically 4 (four) times daily. 01/25/22   Deno Etienne, DO  glucose blood test strip  02/14/19   [provider]  Lancets [facility] DELICA PLUS [id number]) MISC Use to check blood sugar 06/14/19   [provider]  methocarbamol (ROBAXIN) 500 MG tablet Take 1 tablet (500 mg total) by mouth 2 (two) times daily. Patient taking differently: Take 500 mg by mouth in the morning and at bedtime. 01/25/22   Deno Etienne, DO  ONETOUCH ULTRA test strip  11/20/20   [provider]     Family History  Problem Relation Age of Onset   Diabetes Sister    Diabetes Brother    Diabetes Brother    Diabetes Paternal Uncle    Cancer Paternal Aunt        UTERINE   Hypertension Maternal Grandmother    Heart disease Maternal Grandmother    Colon cancer Neg Hx      Social History   Socioeconomic History   Marital status: Married    Spouse name: Not on file   Number of children: Not on file   Years of education: Not on file   Highest education level: Not on file  Occupational History   Not on file  Tobacco Use   Smoking status: Former    Packs/day: 1.00    Years: 25.00    Pack years: 25.00    Types: Cigarettes    Quit date: 11/23/1992    Years since quitting: 29.1   Smokeless tobacco: Never  Vaping Use   Vaping Use: Never used  Substance and Sexual Activity   Alcohol use: Not Currently   Drug use: Never   Sexual activity: Not Currently  Other Topics Concern   Not on file  Social History Narrative   Not on file   Social Determinants of Health   Financial Resource Strain: Not on file  Food Insecurity: Not on file  Transportation Needs: Not on file  Physical Activity: Not on file  Stress: Not on file  Social Connections: Not on file     Review of Systems: A 12 point ROS discussed and pertinent positives are indicated in the HPI above.  All other systems are negative.  Vital Signs: BP 140/85    Pulse (!) 127    Temp (!) 102.8 F (39.3 C) (Oral)    Resp (!) 32    Ht 4' 8" (1.422 m)    Wt 148 lb (67.1 kg)    SpO2 92%    BMI 33.18 kg/m    Physical Exam Vitals reviewed.  Constitutional:      Appearance: She is well-developed. She is not ill-appearing.     Comments: Mild respiratory distress   Cardiovascular:     Rate and Rhythm: Regular rhythm. Tachycardia present.     Heart sounds: Normal heart sounds.  Pulmonary:     Effort: Pulmonary effort is normal. Tachypnea present.  Abdominal:     General: Bowel sounds are normal.     Palpations: Abdomen is soft.  Skin:    General: Skin is warm and dry.     Coloration: Skin is not cyanotic or pale.  Neurological:     Mental Status: She is alert.     Comments: At baseline  Psychiatric:        Mood and Affect: Mood normal.        Behavior: Behavior normal.    MD  Evaluation Airway: WNL Heart: WNL Abdomen: WNL Chest/ Lungs: WNL ASA  Classification: 3 Mallampati/Airway Score: Two  Imaging: DG Chest 2 View  Result Date: 01/25/2022 CLINICAL DATA:  Chest pain. EXAM: CHEST - 2 VIEW COMPARISON:  05/09/2018. FINDINGS: Cardiac silhouette is normal in size. No mediastinal or hilar masses or evidence of adenopathy. Clear lungs.  No pleural effusion or pneumothorax. Skeletal structures are intact. IMPRESSION: No active cardiopulmonary disease. Electronically Signed   By: Lajean Manes M.D.   On: 01/25/2022 15:53   MR THORACIC SPINE W WO CONTRAST  Result Date: 01/26/2022 CLINICAL DATA:  Osteomyelitis, thoracic; Lumbar radiculopathy, infection suspected, positive xray/CT EXAM: MRI THORACIC AND LUMBAR SPINE WITHOUT AND WITH CONTRAST TECHNIQUE: Multiplanar and multiecho pulse sequences of the thoracic and lumbar spine were obtained without and with intravenous contrast. CONTRAST:  34m GADAVIST GADOBUTROL 1 MMOL/ML IV SOLN COMPARISON:  CT of the chest January 25, 2022. FINDINGS: MRI THORACIC SPINE FINDINGS Alignment: Mildly exaggerated thoracic kyphosis. No substantial sagittal subluxation. Vertebrae: Mild T8-T9 and to lesser extent T9-T10 T2 hyperintense signal within the disc spaces. Adjacent marrow signal change involving the T9 and to a lesser extent inferior T8 and superior T10 vertebral bodies, which is T1 and T2 hypointense and sclerotic on prior CT. Cord: Normal cord signal. Paraspinal and other soft tissues: Linear opacities within bilateral dependent lungs. Disc levels: Fairly extensive abnormal enhancement in the dorsal and ventral canal, compatible with epidural phlegmon. Dorsal enhancement extends from the cervicothoracic junction to thoracolumbar junction. Ventral enhancement extends from approximately T6-T7 to T10-T11. Enhancement measures up to 3 mm in thickness dorsally and 2 mm ventrally. No discrete nonenhancing component to suggest abscess. This is superimposed  on multilevel degenerative change, including: Posterior disc bulges at T1-T2, T6-T7, and T12-L1 partially efface ventral CSF with mild canal stenosis at these levels. Posterior disc bulges and ligamentum flavum thickening at T7-T8, T8-T9, T9-T10 and T10-T11 with resulting moderate canal stenosis at these levels. Multilevel facet arthropathy with moderate to severe foraminal stenosis bilaterally at T8-T9, T9-T10, and on the right at T10-T11, T11-T12 and T12-L1. MRI LUMBAR SPINE FINDINGS Segmentation: Transitional lumbosacral anatomy. Four non rib-bearing lumbar vertebral bodies. Sacralization of L5. Alignment:  Grade 1 anterolisthesis of L3 on L4. Vertebrae: Endplate signal changes about the L3-L4 disc, favored degenerative. No appreciable disc edema. Otherwise, no focal marrow edema in the lumbar spine. Conus medullaris: Extends to the inferior L1 level and appears normal. Paraspinal and other soft tissues: Right renal cysts. Disc levels: T12-L1: Left greater than right facet arthropathy. Resulting moderate to severe left and mild right foraminal stenosis. Patent canal. L1-L2: Broad disc bulge with severe right and moderate left facet arthropathy. Resulting severe bilateral foraminal stenosis. Mild canal stenosis. L2-L3: Severe right facet arthropathy. Prior posterior decompression. Severe right and mild left foraminal stenosis. Patent canal. L3-L4: Severe disc height loss with endplate signal changes. Grade 1 anterolisthesis. Broad disc bulge with endplate spurring. Resulting severe bilateral foraminal stenosis. Prior posterior decompression. Transverse narrowing of the canal with resulting mild to moderate canal stenosis. Narrowing of the right greater than left subarticular recesses. L4-L5: Posterior disc bulge. Left greater than right facet arthropathy. Resulting severe left and moderate right foraminal stenosis. Posterior decompression with patent canal. L5-S1: Transitional anatomy.  Patent canal and foramina.  IMPRESSION: Transitional lumbosacral anatomy with 4 lumbar type vertebral bodies and  sacralization of L5. 1. Extensive epidural enhancement throughout the thoracic canal, compatible with epidural phlegmon that is detailed above and measures up to 3 mm in thickness dorsally and 2 mm ventrally. 2. Marked sclerotic change of the T9 and surrounding T8 and T10 endplates with mild edema in the T8-T9 and T9-T10 disc spaces. Findings are suspicious for chronic discitis/osteomyelitis given the above findings. 3. When superimposed on degenerative change, there is multilevel moderate canal stenosis in the lower thoracic spine. Also, mild to moderate canal stenosis at L3-L4 with degenerative disease and prior posterior decompression at this level. 4. Multilevel moderate to severe foraminal stenosis in the thoracic spine and multilevel severe foraminal stenosis in the lumbar spine, as detailed above. Electronically Signed   By: Margaretha Sheffield M.D.   On: 01/26/2022 17:43   MR Lumbar Spine W Wo Contrast  Result Date: 01/26/2022 CLINICAL DATA:  Osteomyelitis, thoracic; Lumbar radiculopathy, infection suspected, positive xray/CT EXAM: MRI THORACIC AND LUMBAR SPINE WITHOUT AND WITH CONTRAST TECHNIQUE: Multiplanar and multiecho pulse sequences of the thoracic and lumbar spine were obtained without and with intravenous contrast. CONTRAST:  82m GADAVIST GADOBUTROL 1 MMOL/ML IV SOLN COMPARISON:  CT of the chest January 25, 2022. FINDINGS: MRI THORACIC SPINE FINDINGS Alignment: Mildly exaggerated thoracic kyphosis. No substantial sagittal subluxation. Vertebrae: Mild T8-T9 and to lesser extent T9-T10 T2 hyperintense signal within the disc spaces. Adjacent marrow signal change involving the T9 and to a lesser extent inferior T8 and superior T10 vertebral bodies, which is T1 and T2 hypointense and sclerotic on prior CT. Cord: Normal cord signal. Paraspinal and other soft tissues: Linear opacities within bilateral dependent lungs. Disc  levels: Fairly extensive abnormal enhancement in the dorsal and ventral canal, compatible with epidural phlegmon. Dorsal enhancement extends from the cervicothoracic junction to thoracolumbar junction. Ventral enhancement extends from approximately T6-T7 to T10-T11. Enhancement measures up to 3 mm in thickness dorsally and 2 mm ventrally. No discrete nonenhancing component to suggest abscess. This is superimposed on multilevel degenerative change, including: Posterior disc bulges at T1-T2, T6-T7, and T12-L1 partially efface ventral CSF with mild canal stenosis at these levels. Posterior disc bulges and ligamentum flavum thickening at T7-T8, T8-T9, T9-T10 and T10-T11 with resulting moderate canal stenosis at these levels. Multilevel facet arthropathy with moderate to severe foraminal stenosis bilaterally at T8-T9, T9-T10, and on the right at T10-T11, T11-T12 and T12-L1. MRI LUMBAR SPINE FINDINGS Segmentation: Transitional lumbosacral anatomy. Four non rib-bearing lumbar vertebral bodies. Sacralization of L5. Alignment:  Grade 1 anterolisthesis of L3 on L4. Vertebrae: Endplate signal changes about the L3-L4 disc, favored degenerative. No appreciable disc edema. Otherwise, no focal marrow edema in the lumbar spine. Conus medullaris: Extends to the inferior L1 level and appears normal. Paraspinal and other soft tissues: Right renal cysts. Disc levels: T12-L1: Left greater than right facet arthropathy. Resulting moderate to severe left and mild right foraminal stenosis. Patent canal. L1-L2: Broad disc bulge with severe right and moderate left facet arthropathy. Resulting severe bilateral foraminal stenosis. Mild canal stenosis. L2-L3: Severe right facet arthropathy. Prior posterior decompression. Severe right and mild left foraminal stenosis. Patent canal. L3-L4: Severe disc height loss with endplate signal changes. Grade 1 anterolisthesis. Broad disc bulge with endplate spurring. Resulting severe bilateral foraminal  stenosis. Prior posterior decompression. Transverse narrowing of the canal with resulting mild to moderate canal stenosis. Narrowing of the right greater than left subarticular recesses. L4-L5: Posterior disc bulge. Left greater than right facet arthropathy. Resulting severe left and moderate right foraminal stenosis. Posterior decompression  with patent canal. L5-S1: Transitional anatomy.  Patent canal and foramina. 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 1. Extensive epidural enhancement throughout the thoracic canal, compatible with epidural phlegmon that is detailed above and measures up to 3 mm in thickness dorsally and 2 mm ventrally. 2. Marked sclerotic change of the T9 and surrounding T8 and T10 endplates with mild edema in the T8-T9 and T9-T10 disc spaces. Findings are suspicious for chronic discitis/osteomyelitis given the above findings. 3. When superimposed on degenerative change, there is multilevel moderate canal stenosis in the lower thoracic spine. Also, mild to moderate canal stenosis at L3-L4 with degenerative disease and prior posterior decompression at this level. 4. Multilevel moderate to severe foraminal stenosis in the thoracic spine and multilevel severe foraminal stenosis in the lumbar spine, as detailed above. Electronically Signed   By: Margaretha Sheffield M.D.   On: 01/26/2022 17:43   DG Chest Port 1 View  Result Date: 01/26/2022 CLINICAL DATA:  Will sepsis, central chest pain, back pain, shortness of breath EXAM: PORTABLE CHEST 1 VIEW COMPARISON:  Portable exam 1347 hours compared to 01/25/2022 FINDINGS: Upper normal size of cardiac silhouette. Atherosclerotic calcification and tortuosity of thoracic aorta. Lungs clear. No acute infiltrate, pleural effusion, or pneumothorax. Bones demineralized with evidence of prior cervicothoracic fusion and LEFT glenohumeral degenerative changes. IMPRESSION: No acute abnormalities.  Aortic Atherosclerosis (ICD10-I70.0). Electronically Signed   By: Lavonia Dana M.D.   On: 01/26/2022 13:56   CT Angio Chest/Abd/Pel for Dissection W and/or Wo Contrast  Result Date: 01/25/2022 CLINICAL DATA:  Acute onset of chest and back pain, initial encounter EXAM: CT ANGIOGRAPHY CHEST, ABDOMEN AND PELVIS TECHNIQUE: Non-contrast CT of the chest was initially obtained. Multidetector CT imaging through the chest, abdomen and pelvis was performed using the standard protocol during bolus administration of intravenous contrast. Multiplanar reconstructed images and MIPs were obtained and reviewed to evaluate the vascular anatomy. RADIATION DOSE REDUCTION: This exam was performed according to the departmental dose-optimization program which includes automated exposure control, adjustment of the mA and/or kV according to patient size and/or use of iterative reconstruction technique. CONTRAST:  168m OMNIPAQUE IOHEXOL 350 MG/ML SOLN patient was pre-medicated for known contrast allergy COMPARISON:  Chest x-ray from earlier in the same day. FINDINGS: CTA CHEST FINDINGS Cardiovascular: Atherosclerotic calcifications of the thoracic aorta are noted. No aneurysmal dilatation is seen. No findings of dissection are noted. Heart is not significantly enlarged. Mild coronary calcifications are seen. The pulmonary artery as visualized shows no evidence of filling defect to suggest pulmonary embolism. Mediastinum/Nodes: Thoracic inlet is within normal limits. Esophagus as visualized is within normal limits. No sizable hilar or mediastinal adenopathy is noted. Lungs/Pleura: Mild atelectatic changes are noted in the bases. No focal confluent infiltrate is seen. No sizable parenchymal nodule is noted. Minimal emphysematous changes are noted. Musculoskeletal: Postsurgical changes are noted at the cervicothoracic junction. No acute rib abnormality is noted. Degenerative changes of the thoracic spine are seen. Significant sclerosis of  the T10 vertebral body is noted of uncertain significance. These changes are new from prior CT in 2007 as well as prior plain film from 2019. These changes may be simply related to underlying degenerative change. Review of the MIP images confirms the above findings. CTA ABDOMEN AND PELVIS FINDINGS VASCULAR Aorta: Atherosclerotic calcifications are noted without aneurysmal dilatation or dissection. Celiac: Patent without evidence of aneurysm, dissection, vasculitis or significant stenosis. SMA: Patent without evidence of aneurysm, dissection, vasculitis or significant stenosis. Renals:  Dual renal arteries are noted bilaterally. No focal stenosis is seen. IMA: Patent without evidence of aneurysm, dissection, vasculitis or significant stenosis. Inflow: Iliacs demonstrate atherosclerotic calcifications without aneurysmal dilatation or dissection. Veins: No specific venous abnormality is seen. Review of the MIP images confirms the above findings. NON-VASCULAR Hepatobiliary: Liver is mildly decreased in attenuation consistent with fatty infiltration. Gallbladder is within normal limits. Pancreas: Unremarkable. No pancreatic ductal dilatation or surrounding inflammatory changes. Spleen: Normal in size without focal abnormality. Adrenals/Urinary Tract: Adrenal glands are within normal limits. Kidneys demonstrate renal cystic change on the right. No definitive renal calculi are seen. No obstructive changes are noted. The bladder is partially distended. Pelvis is somewhat obscured by scatter from bilateral hip replacements. Stomach/Bowel: Scattered diverticular change of the colon is noted without evidence of diverticulitis. The appendix is within normal limits. Small bowel and stomach are unremarkable. Small duodenal diverticulum is noted adjacent to the head of the pancreas. Lymphatic: No significant lymphadenopathy is seen. Reproductive: Status post hysterectomy. No adnexal masses. Other: No abdominal wall hernia or  abnormality. No abdominopelvic ascites. 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 No acute bony abnormality is noted. Degenerative changes of lumbar spine are seen. Review of the MIP images confirms the above findings. IMPRESSION: No evidence of aortic dissection or aneurysmal dilatation. No evidence of pulmonary emboli. Degenerative changes of the thoracic and lumbar spine as described. Mild fatty infiltration of the liver. Diverticulosis without diverticulitis. Aortic Atherosclerosis (ICD10-I70.0) and Emphysema (ICD10-J43.9). Electronically Signed   By: Inez Catalina M.D.   On: 01/25/2022 21:37    Labs:  CBC: Recent Labs    01/25/22 1536 01/26/22 1406 01/27/22 0408  WBC 15.8* 25.8* 25.7*  HGB 13.2 13.0 12.5  HCT 40.9 40.5 39.0  PLT 327 256 244    COAGS: Recent Labs    01/26/22 1406  INR 1.1  APTT 22*    BMP: Recent Labs    01/25/22 1536 01/26/22 1406 01/27/22 0408  NA 140 137 135  K 3.5 3.0* 3.9  CL 104 100 101  CO2 25 25 17*  GLUCOSE 128* 116* 91  BUN _0 CALCIUM 9.5 9.0 8.9  CREATININE 0.72 0.94 0.87  GFRNONAA >60 >60 >60    LIVER FUNCTION TESTS: Recent Labs    01/25/22 1700 01/26/22 1406 01/27/22 0408  BILITOT 1.0 1.0 0.8  AST 32 20 24  ALT _1 ALKPHOS 81 84 94  PROT 7.3 7.1 7.0  ALBUMIN 4.0 3.6 3.3*    TUMOR MARKERS: No results for input(s): AFPTM, CEA, CA199, CHROMGRNA in the last 8760 hours.  Assessment and Plan: 76 y.o. female with back pain, MRI finding concern for T8-9 and T9-10 discitis/osteomyelitis.   NIR was requested for epidural abscess for biopsy at T8 and T9. Case was reviewed and approved for T 9 core biopsy and T8-9 disc aspiration by Dr. Katherina Right Rodigues.   The procedure is tentatively scheduled for today.  Patient has been NPO since 9 pm yesterday per daughter, has sip of water with Tylenol around 10:30 hrs today.  Leukocytosis, febrile, tachycardic and tachypenic  INR 1.1 on 3/6  Not on AC/AP    Risks and benefits of T9 core bx and T8-9 disc aspiration was discussed with the patient and/or patient's family including, but not limited to bleeding, infection, damage to adjacent structures or low yield requiring additional tests.  All of the questions were answered and there is agreement to proceed.  Consent signed and in chart.  Thank you for this interesting consult.  I greatly enjoyed meeting [name] and look forward to participating in their care.  A copy of this report was sent to the requesting provider on this date.  Electronically Signed: Tera Mater, PA-C 01/27/2022, 11:21 AM   I spent a total of  30 Minutes   in face to face in clinical consultation, greater than 50% of which was counseling/coordinating care for T9 core bx and T8-9 disc aspiration.  This chart was dictated using voice recognition software.  Despite best efforts to proofread,  errors can occur which can change the documentation meaning.

## 2022-01-27 NOTE — Progress Notes (Signed)
Echocardiogram ?2D Echocardiogram has been performed. ? ?Arlyss Gandy ?01/27/2022, 4:15 PM ?

## 2022-01-27 NOTE — Assessment & Plan Note (Addendum)
Patient presented with progressive back pain. She was septic on arrival. ?MRI L/T-spine with and without contrast with extensive epidural enhancement, thoracic canal compatible with epidural phlegmon, sclerotic changes T8, T9, T10 with mild edema suspicious for discitis/osteomyelitis, mild/moderate lumbar stenosis and moderate to severe foraminal stenosis thoracic spine.  Likely complicated by history of immunosuppression with history of rheumatoid arthritis on immunosuppression with Enbrel/methotrexate.  ?Evaluated by neurosurgery who recommended nonoperative management with IR consultation for biopsy / aspiration and antibiotic therapy ?TTE with LVEF 60 to 38%, grade 1 diastolic dysfunction, no LV regional wall motion abnormalities, trivial MR, IVC normal.   ?She underwent T9 core bone biopsy and T8-9 disc aspiration by IR on 3/7.   ?Lactic acidosis now resolved. ?Neurosurgery and infectious disease following, appreciate assistance ?WBC 27.5>19.5>20.5 ?Hold Enbrel/methotrexate. ?Blood cultures x 2: No growth x 3 days ?T8-9 culture, no organisms/WBC seen, further pending ?AFB and acid-fast culture: Pending ?Continued vancomycin until 3/10.  Antibiotic changed to Ancef since cultures growing MSSA ?Needs 6 to 8 weeks of IV antibiotics from 01/27/2022. ?HHS arranged. ? ?

## 2022-01-27 NOTE — Progress Notes (Signed)
Pharmacy Antibiotic Note ? ?[name] is a 76 y.o. female admitted on 01/26/2022 with sepsis.  Pharmacy has been consulted for vancomycin and cefepime dosing for concern of osteomyelitis/discitis. Tmax 102.8. WBC 25.7.  ? ?Plan: ?Continue cefepime 2g q12h ?Adjust vancomycin to '500mg'$  IV q24h (eAUC 469 using scr 0.87 and VD 0.5 for BMI 33) ?Monitor renal function, cultures, and clinical progression ?Vancomycin levels as appropriate ? ?Height: '4\' 8"'$  (142.2 cm) ?Weight: 67.1 kg (148 lb) ?IBW/kg (Calculated) : 36.3 ? ?Temp (24hrs), Avg:100.5 ?F (38.1 ?C), Min:98.4 ?F (36.9 ?C), Max:102.8 ?F (39.3 ?C) ? ?Recent Labs  ?Lab 01/25/22 ?1536 01/26/22 ?1406 01/27/22 ?0408 01/27/22 ?5400  ?WBC 15.8* 25.8* 25.7*  --   ?CREATININE 0.72 0.94 0.87  --   ?LATICACIDVEN  --  2.7*  --  1.0  ? ?  ?Estimated Creatinine Clearance: 44.2 mL/min (by C-G formula based on SCr of 0.87 mg/dL).   ? ?Allergies  ?Allergen Reactions  ? Ivp Dye [Iodinated Contrast Media] Nausea And Vomiting  ? Prednisone Other (See Comments)  ?  reflux ?Other reaction(s): acid reflux ?Other reaction(s): acid reflux ?Other reaction(s): acid reflux  ? ? ?Antimicrobials this admission: ?Cefepime 3/6 >>  ?Vancomycin 3/6 >>  ? ?Microbiology results: ?3/6 bcx: ?3/7 ucx: ? ?Thank you for allowing pharmacy to be a part of this patient?s care. ? ?Cristela Felt, PharmD, BCPS ?Clinical Pharmacist ?01/27/2022 10:26 AM ? ?  ?

## 2022-01-27 NOTE — Hospital Course (Addendum)
[name] is a 76 year old female with PMH significant for rheumatoid arthritis, type 2 diabetes mellitus, essential hypertension, osteoarthritis s/p bilateral hip replacements, history of ACDF 2017 and lumbar laminectomy/decompression with microdiscectomy 2019, history of previous MRSA infection requiring 6-week outpatient antibiotic course who presented to [facility] ED on 3/6 for back pain that has been progressing over the last 3 days.  Patient reports onset 3 days ago with pain in upper back with radiation to the chest.  Denies any incontinence of urine, bowel; no lower extremity weakness and no paresthesias. ? ?In the ED, temperature 102.2 ?F, HR 127, RR 25, BP 156/90, SPO2 94% on room air.  WBC 25.8, hemoglobin 13.0, platelet count 256.  Sodium 137, potassium 3.0, chloride 100, CO2 25, glucose 116, BUN 19, creatinine 0.94.  Lipase 26, AST 20, ALT 21, total bilirubin 1.0.  Lactic acid 2.7.  Cova-19 PCR negative.  Influenza A/B PCR negative.  INR 1.1.  Urinalysis unrevealing.  Chest x-ray with no acute cardiopulmonary disease process.  MR L/T-spine with and without contrast with extensive epidural enhancement thoracic canal compatible with epidural phlegmon, sclerotic changes T8, T9, T10 with mild edema suspicious for discitis/osteomyelitis, mild/moderate lumbar stenosis and moderate to severe foraminal stenosis thoracic spine.  EDP consulted neurosurgery, Dr. Christella Noa who reviewed imaging studies and recommended IR for needle biopsy of T8 or T9 and to continue antibiotic treatment.  TRH consulted for further evaluation and management of osteomyelitis/discitis with concern of epidural phlegmon. ? ? ?

## 2022-01-27 NOTE — Progress Notes (Signed)
PROGRESS NOTE    [name]  YBW:[id number] DOB: 06-16-1946 DOA: 01/26/2022 PCP: Merrilee Seashore, MD    Brief Narrative:  [name] is a 76 year old female with past medical history significant for rheumatoid arthritis, type 2 diabetes mellitus, essential hypertension, osteoarthritis s/p bilateral hip replacements, history of ACDF 2017 and lumbar laminectomy/decompression with microdiscectomy 2019, history of previous MRSA infection requiring 6-week outpatient antibiotic course who presented to [facility] ED on 3/6 for back pain that has been progressing over the last 3 days.  Patient reports onset 3 days ago with pain in upper back with radiation to the chest.  Denies any incontinence of urine, bowel; no lower extremity weakness and no paresthesias.  In the ED, temperature 102.2 F, HR 127, RR 25, BP 156/90, SPO2 94% on room air.  WBC 25.8, hemoglobin 13.0, platelet count 256.  Sodium 137, potassium 3.0, chloride 100, CO2 25, glucose 116, BUN 19, creatinine 0.94.  Lipase 26, AST 20, ALT 21, total bilirubin 1.0.  Lactic acid 2.7.  Cova-19 PCR negative.  Influenza A/B PCR negative.  INR 1.1.  Urinalysis unrevealing.  Chest x-ray with no acute cardiopulmonary disease process.  MR L/T-spine with and without contrast with extensive epidural enhancement thoracic canal compatible with epidural phlegmon, sclerotic changes T8, T9, T10 with mild edema suspicious for discitis/osteomyelitis, mild/moderate lumbar stenosis and moderate to severe foraminal stenosis thoracic spine.  EDP consulted neurosurgery, Dr. Christella Noa who reviewed imaging studies and recommended IR for needle biopsy of T8 or T9 and to continue antibiotic treatment.  TRH consulted for further evaluation and management of osteomyelitis/discitis with concern of epidural phlegmon.      Assessment & Plan:   Assessment and Plan: * Sepsis ([employer]) discitis/osteomyelitis, epidural phlegmon Patient presenting to the ED with progressive back pain.   Found to be febrile, with leukocytosis, elevated lactic acid on admission with tachycardia, tachypnea.  MR L/T-spine with and without contrast with extensive epidural enhancement thoracic canal compatible with epidural phlegmon, sclerotic changes T8, T9, T10 with mild edema suspicious for discitis/osteomyelitis, mild/moderate lumbar stenosis and moderate to severe foraminal stenosis thoracic spine.  Likely complicated by history of immunosuppression with history of rheumatoid arthritis on immunosuppression with Enbrel/methotrexate.  Evaluated by neurosurgery, Dr. Christella Noa; who recommended nonoperative management with IR consultation for biopsy/aspiration and antibiotic therapy.  Patient was given aggressive IV fluid resuscitation with 30 cc/kg IV fluids. --Interventional radiology consulted for biopsy/aspiration --Infectious disease consulted for assistance with antibiotic management --Holding home Enbrel/methotrexate --Blood cultures x2: Pending --Vancomycin, pharmacy consulted for dosing/monitoring --Cefepime --Continue LR at 125 MLS per hour  HTN (hypertension) -- Cardizem 100 mg p.o. daily -- Enalapril 20 mg p.o. twice daily -- Holding home Bumex -- Continue monitor BP closely in the setting of sepsis as above  Diabetes ([place]) Home regimen includes metformin 500 mg p.o. twice daily.  Most recent hemoglobin A1c in EMR 5.3 2019. --Update hemoglobin A1c --Hold oral hypoglycemics while inpatient --SSI for coverage --CBG before every meal/at bedtime  Centrilobular emphysema ([place]) On Trelegy Ellipta outpatient. --Continue Incruse Ellipta Breo Ellipta as hospital substitutions and patient. --Albuterol neb every 4 hours as needed shortness of breath/wheezing  Rheumatoid arthritis ([place]) Patient is on methotrexate and Enbrel outpatient. -- Hold immunosuppressive's in the setting of sepsis as above  Discitis -- Treatment as above     DVT prophylaxis: SCDs Start: 01/26/22 2326    Code  Status: Full Code Family Communication: Updated patient's daughter present at bedside this morning  Disposition Plan:  Level of care:  Progressive Status is: Inpatient Remains inpatient appropriate because: Pending IR biopsy/aspiration for discitis, epidural phlegmon, IV antibiotics, awaiting further recommendations from infectious disease, neurosurgery    Consultants:  Neurosurgery, Dr. Christella Noa Infectious disease, Dr. Montel Culver  Procedures:  None  Antimicrobials:  Vancomycin 3/6>> Cefepime 3/6>> Metronidazole 3/6 - 3/6   Subjective: Patient seen examined bedside, resting comfortably.  Continues with some mild back pain upon movement.  No other specific complaints at this time.  Daughter present at bedside and updated on plan of care.  Awaiting IR evaluation for aspiration/biopsy.  Infectious disease consulted.  Continues on IV antibiotics.  Continues with intermittent fevers.  No other specific questions or concerns at this time.  Currently denies headache, no chills/night sweats, no chest pain, no palpitations, no shortness of breath, no abdominal pain, no focal weakness, no cough/congestion, no paresthesias.  No acute events overnight per nursing staff.  Objective: Vitals:   01/27/22 0226 01/27/22 0320 01/27/22 0736 01/27/22 0910  BP:  (!) 134/107 140/85   Pulse:  (!) 117 (!) 127   Resp:  20 (!) 32   Temp: 100.1 F (37.8 C)   (!) 102.8 F (39.3 C)  TempSrc: Oral   Oral  SpO2:  96% 92%   Weight:      Height:        Intake/Output Summary (Last 24 hours) at 01/27/2022 1051 Last data filed at 01/27/2022 0242 Gross per 24 hour  Intake 2000 ml  Output 300 ml  Net 1700 ml   Filed Weights   01/26/22 1243  Weight: 67.1 kg    Examination:  Physical Exam: GEN: NAD, alert and oriented x 3, elderly in appearance HEENT: NCAT, PERRL, EOMI, sclera clear, MMM PULM: CTAB w/o wheezes/crackles, normal respiratory effort, on room air CV: Tachycardic, regular rhythm w/o M/G/R GI:  abd soft, NTND, NABS, no R/G/M MSK: no peripheral edema, muscle strength globally intact 5/5 bilateral upper/lower extremities NEURO: CN II-XII intact, no focal deficits, sensation to light touch intact PSYCH: normal mood/affect Integumentary: dry/intact, no rashes or wounds    Data Reviewed: I have personally reviewed following labs and imaging studies  CBC: Recent Labs  Lab 01/25/22 1536 01/26/22 1406 01/27/22 0408  WBC 15.8* 25.8* 25.7*  NEUTROABS  --  23.0*  --   HGB 13.2 13.0 12.5  HCT 40.9 40.5 39.0  MCV 85.0 84.7 86.9  PLT 327 256 284   Basic Metabolic Panel: Recent Labs  Lab 01/25/22 1536 01/26/22 1406 01/27/22 0408  NA 140 137 135  K 3.5 3.0* 3.9  CL 104 100 101  CO2 25 25 17*  GLUCOSE 128* 116* 91  BUN '14 19 19  '$ CREATININE 0.72 0.94 0.87  CALCIUM 9.5 9.0 8.9   GFR: Estimated Creatinine Clearance: 44.2 mL/min (by C-G formula based on SCr of 0.87 mg/dL). Liver Function Tests: Recent Labs  Lab 01/25/22 1700 01/26/22 1406 01/27/22 0408  AST 32 20 24  ALT '24 21 19  '$ ALKPHOS 81 84 94  BILITOT 1.0 1.0 0.8  PROT 7.3 7.1 7.0  ALBUMIN 4.0 3.6 3.3*   Recent Labs  Lab 01/25/22 1700 01/26/22 1406  LIPASE 30 26   No results for input(s): AMMONIA in the last 168 hours. Coagulation Profile: Recent Labs  Lab 01/26/22 1406  INR 1.1   Cardiac Enzymes: No results for input(s): CKTOTAL, CKMB, CKMBINDEX, TROPONINI in the last 168 hours. BNP (last 3 results) No results for input(s): PROBNP in the last 8760 hours. HbA1C: No results for input(s): HGBA1C in  the last 72 hours. CBG: Recent Labs  Lab 01/25/22 1849 01/27/22 0919  GLUCAP 140* 142*   Lipid Profile: No results for input(s): CHOL, HDL, LDLCALC, TRIG, CHOLHDL, LDLDIRECT in the last 72 hours. Thyroid Function Tests: No results for input(s): TSH, T4TOTAL, FREET4, T3FREE, THYROIDAB in the last 72 hours. Anemia Panel: No results for input(s): VITAMINB12, FOLATE, FERRITIN, TIBC, IRON, RETICCTPCT  in the last 72 hours. Sepsis Labs: Recent Labs  Lab 01/26/22 1406 01/27/22 0808  LATICACIDVEN 2.7* 1.0    Recent Results (from the past 240 hour(s))  Resp Panel by RT-PCR (Flu A&B, Covid) Nasopharyngeal Swab     Status: None   Collection Time: 01/26/22  2:06 PM   Specimen: Nasopharyngeal Swab; Nasopharyngeal(NP) swabs in vial transport medium  Result Value Ref Range Status   SARS Coronavirus 2 by RT PCR NEGATIVE NEGATIVE Final    Comment: (NOTE) SARS-CoV-2 target nucleic acids are NOT DETECTED.  The SARS-CoV-2 RNA is generally detectable in upper respiratory specimens during the acute phase of infection. The lowest concentration of SARS-CoV-2 viral copies this assay can detect is 138 copies/mL. A negative result does not preclude SARS-Cov-2 infection and should not be used as the sole basis for treatment or other patient management decisions. A negative result may occur with  improper specimen collection/handling, submission of specimen other than nasopharyngeal swab, presence of viral mutation(s) within the areas targeted by this assay, and inadequate number of viral copies(<138 copies/mL). A negative result must be combined with clinical observations, patient history, and epidemiological information. The expected result is Negative.  Fact Sheet for Patients:  EntrepreneurPulse.com.au  Fact Sheet for Healthcare Providers:  IncredibleEmployment.be  This test is no t yet approved or cleared by the [place] FDA and  has been authorized for detection and/or diagnosis of SARS-CoV-2 by FDA under an Emergency Use Authorization (EUA). This EUA will remain  in effect (meaning this test can be used) for the duration of the COVID-19 declaration under Section 564(b)(1) of the Act, 21 U.S.C.section 360bbb-3(b)(1), unless the authorization is terminated  or revoked sooner.       Influenza A by PCR NEGATIVE NEGATIVE Final   Influenza B by PCR  NEGATIVE NEGATIVE Final    Comment: (NOTE) The Xpert Xpress SARS-CoV-2/FLU/RSV plus assay is intended as an aid in the diagnosis of influenza from Nasopharyngeal swab specimens and should not be used as a sole basis for treatment. Nasal washings and aspirates are unacceptable for Xpert Xpress SARS-CoV-2/FLU/RSV testing.  Fact Sheet for Patients: EntrepreneurPulse.com.au  Fact Sheet for Healthcare Providers: IncredibleEmployment.be  This test is not yet approved or cleared by the [place] FDA and has been authorized for detection and/or diagnosis of SARS-CoV-2 by FDA under an Emergency Use Authorization (EUA). This EUA will remain in effect (meaning this test can be used) for the duration of the COVID-19 declaration under Section 564(b)(1) of the Act, 21 U.S.C. section 360bbb-3(b)(1), unless the authorization is terminated or revoked.  Performed at [facility], [place] [address] [place], [place] [address]          Radiology Studies: DG Chest 2 View  Result Date: 01/25/2022 CLINICAL DATA:  Chest pain. EXAM: CHEST - 2 VIEW COMPARISON:  05/09/2018. FINDINGS: Cardiac silhouette is normal in size. No mediastinal or hilar masses or evidence of adenopathy. Clear lungs.  No pleural effusion or pneumothorax. Skeletal structures are intact. IMPRESSION: No active cardiopulmonary disease. Electronically Signed   By: Lajean Manes M.D.   On: 01/25/2022 15:53  MR THORACIC SPINE W WO CONTRAST  Result Date: 01/26/2022 CLINICAL DATA:  Osteomyelitis, thoracic; Lumbar radiculopathy, infection suspected, positive xray/CT EXAM: MRI THORACIC AND LUMBAR SPINE WITHOUT AND WITH CONTRAST TECHNIQUE: Multiplanar and multiecho pulse sequences of the thoracic and lumbar spine were obtained without and with intravenous contrast. CONTRAST:  77m GADAVIST GADOBUTROL 1 MMOL/ML IV SOLN COMPARISON:  CT of the chest January 25, 2022. FINDINGS: MRI THORACIC SPINE FINDINGS  Alignment: Mildly exaggerated thoracic kyphosis. No substantial sagittal subluxation. Vertebrae: Mild T8-T9 and to lesser extent T9-T10 T2 hyperintense signal within the disc spaces. Adjacent marrow signal change involving the T9 and to a lesser extent inferior T8 and superior T10 vertebral bodies, which is T1 and T2 hypointense and sclerotic on prior CT. Cord: Normal cord signal. Paraspinal and other soft tissues: Linear opacities within bilateral dependent lungs. Disc levels: Fairly extensive abnormal enhancement in the dorsal and ventral canal, compatible with epidural phlegmon. Dorsal enhancement extends from the cervicothoracic junction to thoracolumbar junction. Ventral enhancement extends from approximately T6-T7 to T10-T11. Enhancement measures up to 3 mm in thickness dorsally and 2 mm ventrally. No discrete nonenhancing component to suggest abscess. This is superimposed on multilevel degenerative change, including: Posterior disc bulges at T1-T2, T6-T7, and T12-L1 partially efface ventral CSF with mild canal stenosis at these levels. Posterior disc bulges and ligamentum flavum thickening at T7-T8, T8-T9, T9-T10 and T10-T11 with resulting moderate canal stenosis at these levels. Multilevel facet arthropathy with moderate to severe foraminal stenosis bilaterally at T8-T9, T9-T10, and on the right at T10-T11, T11-T12 and T12-L1. MRI LUMBAR SPINE FINDINGS Segmentation: Transitional lumbosacral anatomy. Four non rib-bearing lumbar vertebral bodies. Sacralization of L5. Alignment:  Grade 1 anterolisthesis of L3 on L4. Vertebrae: Endplate signal changes about the L3-L4 disc, favored degenerative. No appreciable disc edema. Otherwise, no focal marrow edema in the lumbar spine. Conus medullaris: Extends to the inferior L1 level and appears normal. Paraspinal and other soft tissues: Right renal cysts. Disc levels: T12-L1: Left greater than right facet arthropathy. Resulting moderate to severe left and mild right  foraminal stenosis. Patent canal. L1-L2: Broad disc bulge with severe right and moderate left facet arthropathy. Resulting severe bilateral foraminal stenosis. Mild canal stenosis. L2-L3: Severe right facet arthropathy. Prior posterior decompression. Severe right and mild left foraminal stenosis. Patent canal. L3-L4: Severe disc height loss with endplate signal changes. Grade 1 anterolisthesis. Broad disc bulge with endplate spurring. Resulting severe bilateral foraminal stenosis. Prior posterior decompression. Transverse narrowing of the canal with resulting mild to moderate canal stenosis. Narrowing of the right greater than left subarticular recesses. L4-L5: Posterior disc bulge. Left greater than right facet arthropathy. Resulting severe left and moderate right foraminal stenosis. Posterior decompression with patent canal. L5-S1: Transitional anatomy.  Patent canal and foramina. 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 1. Extensive epidural enhancement throughout the thoracic canal, compatible with epidural phlegmon that is detailed above and measures up to 3 mm in thickness dorsally and 2 mm ventrally. 2. Marked sclerotic change of the T9 and surrounding T8 and T10 endplates with mild edema in the T8-T9 and T9-T10 disc spaces. Findings are suspicious for chronic discitis/osteomyelitis given the above findings. 3. When superimposed on degenerative change, there is multilevel moderate canal stenosis in the lower thoracic spine. Also, mild to moderate canal stenosis at L3-L4 with degenerative disease and prior posterior decompression at this level. 4. Multilevel moderate to severe foraminal stenosis in the thoracic spine and multilevel severe foraminal stenosis in  the lumbar spine, as detailed above. Electronically Signed   By: Margaretha Sheffield M.D.   On: 01/26/2022 17:43   MR Lumbar Spine W Wo Contrast  Result Date: 01/26/2022 CLINICAL DATA:   Osteomyelitis, thoracic; Lumbar radiculopathy, infection suspected, positive xray/CT EXAM: MRI THORACIC AND LUMBAR SPINE WITHOUT AND WITH CONTRAST TECHNIQUE: Multiplanar and multiecho pulse sequences of the thoracic and lumbar spine were obtained without and with intravenous contrast. CONTRAST:  73m GADAVIST GADOBUTROL 1 MMOL/ML IV SOLN COMPARISON:  CT of the chest January 25, 2022. FINDINGS: MRI THORACIC SPINE FINDINGS Alignment: Mildly exaggerated thoracic kyphosis. No substantial sagittal subluxation. Vertebrae: Mild T8-T9 and to lesser extent T9-T10 T2 hyperintense signal within the disc spaces. Adjacent marrow signal change involving the T9 and to a lesser extent inferior T8 and superior T10 vertebral bodies, which is T1 and T2 hypointense and sclerotic on prior CT. Cord: Normal cord signal. Paraspinal and other soft tissues: Linear opacities within bilateral dependent lungs. Disc levels: Fairly extensive abnormal enhancement in the dorsal and ventral canal, compatible with epidural phlegmon. Dorsal enhancement extends from the cervicothoracic junction to thoracolumbar junction. Ventral enhancement extends from approximately T6-T7 to T10-T11. Enhancement measures up to 3 mm in thickness dorsally and 2 mm ventrally. No discrete nonenhancing component to suggest abscess. This is superimposed on multilevel degenerative change, including: Posterior disc bulges at T1-T2, T6-T7, and T12-L1 partially efface ventral CSF with mild canal stenosis at these levels. Posterior disc bulges and ligamentum flavum thickening at T7-T8, T8-T9, T9-T10 and T10-T11 with resulting moderate canal stenosis at these levels. Multilevel facet arthropathy with moderate to severe foraminal stenosis bilaterally at T8-T9, T9-T10, and on the right at T10-T11, T11-T12 and T12-L1. MRI LUMBAR SPINE FINDINGS Segmentation: Transitional lumbosacral anatomy. Four non rib-bearing lumbar vertebral bodies. Sacralization of L5. Alignment:  Grade 1  anterolisthesis of L3 on L4. Vertebrae: Endplate signal changes about the L3-L4 disc, favored degenerative. No appreciable disc edema. Otherwise, no focal marrow edema in the lumbar spine. Conus medullaris: Extends to the inferior L1 level and appears normal. Paraspinal and other soft tissues: Right renal cysts. Disc levels: T12-L1: Left greater than right facet arthropathy. Resulting moderate to severe left and mild right foraminal stenosis. Patent canal. L1-L2: Broad disc bulge with severe right and moderate left facet arthropathy. Resulting severe bilateral foraminal stenosis. Mild canal stenosis. L2-L3: Severe right facet arthropathy. Prior posterior decompression. Severe right and mild left foraminal stenosis. Patent canal. L3-L4: Severe disc height loss with endplate signal changes. Grade 1 anterolisthesis. Broad disc bulge with endplate spurring. Resulting severe bilateral foraminal stenosis. Prior posterior decompression. Transverse narrowing of the canal with resulting mild to moderate canal stenosis. Narrowing of the right greater than left subarticular recesses. L4-L5: Posterior disc bulge. Left greater than right facet arthropathy. Resulting severe left and moderate right foraminal stenosis. Posterior decompression with patent canal. L5-S1: Transitional anatomy.  Patent canal and foramina. 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 1. Extensive epidural enhancement throughout the thoracic canal, compatible with epidural phlegmon that is detailed above and measures up to 3 mm in thickness dorsally and 2 mm ventrally. 2. Marked sclerotic change of the T9 and surrounding T8 and T10 endplates with mild edema in the T8-T9 and T9-T10 disc spaces. Findings are suspicious for chronic discitis/osteomyelitis given the above findings. 3. When superimposed on degenerative change, there is multilevel moderate canal stenosis in the lower thoracic spine.  Also, mild to moderate canal stenosis at L3-L4 with degenerative disease and  prior posterior decompression at this level. 4. Multilevel moderate to severe foraminal stenosis in the thoracic spine and multilevel severe foraminal stenosis in the lumbar spine, as detailed above. Electronically Signed   By: Margaretha Sheffield M.D.   On: 01/26/2022 17:43   DG Chest Port 1 View  Result Date: 01/26/2022 CLINICAL DATA:  Will sepsis, central chest pain, back pain, shortness of breath EXAM: PORTABLE CHEST 1 VIEW COMPARISON:  Portable exam 1347 hours compared to 01/25/2022 FINDINGS: Upper normal size of cardiac silhouette. Atherosclerotic calcification and tortuosity of thoracic aorta. Lungs clear. No acute infiltrate, pleural effusion, or pneumothorax. Bones demineralized with evidence of prior cervicothoracic fusion and LEFT glenohumeral degenerative changes. IMPRESSION: No acute abnormalities. Aortic Atherosclerosis (ICD10-I70.0). Electronically Signed   By: Lavonia Dana M.D.   On: 01/26/2022 13:56   CT Angio Chest/Abd/Pel for Dissection W and/or Wo Contrast  Result Date: 01/25/2022 CLINICAL DATA:  Acute onset of chest and back pain, initial encounter EXAM: CT ANGIOGRAPHY CHEST, ABDOMEN AND PELVIS TECHNIQUE: Non-contrast CT of the chest was initially obtained. Multidetector CT imaging through the chest, abdomen and pelvis was performed using the standard protocol during bolus administration of intravenous contrast. Multiplanar reconstructed images and MIPs were obtained and reviewed to evaluate the vascular anatomy. RADIATION DOSE REDUCTION: This exam was performed according to the departmental dose-optimization program which includes automated exposure control, adjustment of the mA and/or kV according to patient size and/or use of iterative reconstruction technique. CONTRAST:  151m OMNIPAQUE IOHEXOL 350 MG/ML SOLN patient was pre-medicated for known contrast allergy COMPARISON:  Chest x-ray from earlier in the same  day. FINDINGS: CTA CHEST FINDINGS Cardiovascular: Atherosclerotic calcifications of the thoracic aorta are noted. No aneurysmal dilatation is seen. No findings of dissection are noted. Heart is not significantly enlarged. Mild coronary calcifications are seen. The pulmonary artery as visualized shows no evidence of filling defect to suggest pulmonary embolism. Mediastinum/Nodes: Thoracic inlet is within normal limits. Esophagus as visualized is within normal limits. No sizable hilar or mediastinal adenopathy is noted. Lungs/Pleura: Mild atelectatic changes are noted in the bases. No focal confluent infiltrate is seen. No sizable parenchymal nodule is noted. Minimal emphysematous changes are noted. Musculoskeletal: Postsurgical changes are noted at the cervicothoracic junction. No acute rib abnormality is noted. Degenerative changes of the thoracic spine are seen. Significant sclerosis of the T10 vertebral body is noted of uncertain significance. These changes are new from prior CT in 2007 as well as prior plain film from 2019. These changes may be simply related to underlying degenerative change. Review of the MIP images confirms the above findings. CTA ABDOMEN AND PELVIS FINDINGS VASCULAR Aorta: Atherosclerotic calcifications are noted without aneurysmal dilatation or dissection. Celiac: Patent without evidence of aneurysm, dissection, vasculitis or significant stenosis. SMA: Patent without evidence of aneurysm, dissection, vasculitis or significant stenosis. 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 No focal stenosis is seen. IMA: Patent without evidence of aneurysm, dissection, vasculitis or significant stenosis. Inflow: Iliacs demonstrate atherosclerotic calcifications without aneurysmal dilatation or dissection. Veins: No specific venous abnormality is seen. Review of the MIP images confirms the above findings. NON-VASCULAR Hepatobiliary: Liver is mildly decreased in attenuation consistent with fatty  infiltration. Gallbladder is within normal limits. Pancreas: Unremarkable. No pancreatic ductal dilatation or surrounding inflammatory changes. Spleen: Normal in size without focal abnormality. Adrenals/Urinary Tract: Adrenal glands are within normal limits. Kidneys demonstrate renal cystic change on the right. No definitive renal calculi are seen. No obstructive changes are noted. The bladder is partially distended. Pelvis is  somewhat obscured by scatter from bilateral hip replacements. Stomach/Bowel: Scattered diverticular change of the colon is noted without evidence of diverticulitis. The appendix is within normal limits. Small bowel and stomach are unremarkable. Small duodenal diverticulum is noted adjacent to the head of the pancreas. Lymphatic: No significant lymphadenopathy is seen. Reproductive: Status post hysterectomy. No adnexal masses. Other: No abdominal wall hernia or abnormality. No abdominopelvic ascites. 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 No acute bony abnormality is noted. Degenerative changes of lumbar spine are seen. Review of the MIP images confirms the above findings. IMPRESSION: No evidence of aortic dissection or aneurysmal dilatation. No evidence of pulmonary emboli. Degenerative changes of the thoracic and lumbar spine as described. Mild fatty infiltration of the liver. Diverticulosis without diverticulitis. Aortic Atherosclerosis (ICD10-I70.0) and Emphysema (ICD10-J43.9). Electronically Signed   By: Inez Catalina M.D.   On: 01/25/2022 21:37        Scheduled Meds:  diltiazem  180 mg Oral Daily   enalapril  20 mg Oral BID   fluticasone furoate-vilanterol  1 puff Inhalation Daily   And   umeclidinium bromide  1 puff Inhalation Daily   folic acid  0.5 mg Oral Daily   gabapentin  300 mg Oral QHS   insulin aspart  0-9 Units Subcutaneous TID WC   pravastatin  40 mg Oral q1800   Continuous Infusions:  ceFEPime (MAXIPIME) IV Stopped (01/27/22 0539)   lactated  ringers 100 mL/hr at 01/27/22 0235   vancomycin       LOS: 1 day    Time spent: 59 minutes spent on chart review, discussion with nursing staff, consultants, updating family and interview/physical exam; more than 50% of that time was spent in counseling and/or coordination of care.    Christyan Reger J [place] DO [facility] Available via Epic secure chat 7am-7pm After these hours, please refer to coverage provider listed on [email or web address] 01/27/2022, 10:51 AM

## 2022-01-27 NOTE — Assessment & Plan Note (Addendum)
On Trelegy Ellipta as outpatient. ?Continue Incruse Ellipta Breo Ellipta as hospital substitutions. ?Albuterol neb every 4 hours as needed shortness of breath/wheezing ?

## 2022-01-27 NOTE — Procedures (Signed)
INTERVENTIONAL NEURORADIOLOGY BRIEF POSTPROCEDURE NOTE ? ?FLUOROSCOPY GUIDED T9 CORE BONE BIOPSY AND T8-9 DISC ASPIRATION  ? ?Attending: Dr. Pedro Earls ? ?Assistant: None.  ? ?Diagnosis: Sclerotic changes concerning for discitis/osteomyelitis.  ? ?Access site: Percutaneous.  ? ?Anesthesia: Moderate sedation.  ? ?Medication used: 2 mg Versed IV; 75 mcg Fentanyl IV; 1 g of Ancef IV. ? ?Complications: None.  ? ?Estimated blood loss: Negligible.  ? ?Specimen: 1 core bone biopsy sample of the T9 vertebral body; 2 fine needle aspiration of the T8-9 intervertebral disc.  ? ?Fluoroscopy guided core bone biopsy sample of the T9 vertebral body was obtained via left transpedicular approach.  Subsequently, fluoroscopy guided advancement of a 22 gauge spinal needle into the T8-9 disc space was performed.  Two fine-needle aspiration samples were collected.  ? ?The patient tolerated the procedure well without incident or complication and is in stable condition. ? ?  ?

## 2022-01-27 NOTE — Assessment & Plan Note (Signed)
Treatment as above 

## 2022-01-27 NOTE — Assessment & Plan Note (Addendum)
Patient is on methotrexate and Enbrel outpatient. ?Hold immunosuppressive medications in the setting of sepsis as above. ?

## 2022-01-27 NOTE — Progress Notes (Signed)
Pt arrived to [facility] from ED. Pt a&ox4, hypertensive and tachycardic. Alerted [facility], and gave ordered PRN hydralazine and lopressor. Pt's daughter at bedside.  ? ?Justice Rocher, RN ? ?

## 2022-01-27 NOTE — Assessment & Plan Note (Addendum)
Continue pravastatin 

## 2022-01-27 NOTE — Consult Note (Addendum)
I have seen and examined the patient. I have personally reviewed the clinical findings, laboratory findings, microbiological data and imaging studies. The assessment and treatment plan was discussed with the Nurse Practitioner [name]. I agree with her/his recommendations except following additions/corrections.  76 year old female with PMH of RA on immunosuppresants ( weekly Etanercept+ Methotrexate/OA, DM2, HTN, HLD, Asthma, Bilateral hip replacements, Bilateral shoulder pyoarthrosis s/p bilateral arthroscopy with debridement/synovectomy and lavage with MSSA bacteremia ( treated with cefazolin for 6 weeks in 2012) and chronic back pain who presented to the ED 3/5 with acute on chronic worsening upper thoracic back pain. Seen in the ED 1 day ago for back pain where work up was unremarkable and was discharged on analgesics. Also saw her PCP where she was noted to be confused and ended up coming to the ED 3/6 with worsening back pain/fevers and chills. She has h/o Lumbar laminectomy/decompression/microdiscectomy  in 01/2018 with DR [name]. Works as a Control and instrumentation engineer. Denies smoking, alcohol and recreational drug use. No pets at home/exposure to farm. Denies travel out of the country or recent travel of contact with TB. She seems to have h/o stress incontinence but no new urinary/fecal incontinence or urinary retention.   At ED febrile with leukocytosis, elevated lactic acid, concerning for sepsis.  Labs/Imaging/Microbiological data reviewed  No neurosurgical intervention per Neurosurgery IR planning for aspiration and biopsy  On Vancomycin/cefepime and metronidazole  Exam lying in bed, on [place], moderate distress due to pain            Awake, alert and oriented            Clear lungs sounds           Tachycardic and regular rhythm             Abdomen soft, distended             Grossly non focal neuro exam, power is 5/5 in upper extremities and at least 4/5 in bilateral lower extremities (  restricted due to pain ), sensation intact   Continue vancomycin, pharmacy to dose Will switch cefepime to ceftraixone and DC metronidazole Fu blood cultures  Fu IR aspiration/biopsy for cultures. Please send cultures for aerobic/anaerobic, fungal and afb cultures and pathology  TTE  Monitor CBC and CMP Agree with holding  etanercept given active infection  Following   Anne Oz, MD [facility] [address] [place], [place] [address] Phone: [phone]   Fax: [place] [facility]    Date of Admission:  01/26/2022     Total days of antibiotics 2               Reason for Consult: Discitis/osteomyelitis with epidural abscess   Referring Provider: Dr. [place]  Primary Care Provider: Merrilee Seashore, MD   ASSESSMENT:  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 Awaiting IR evaluation for aspiration for culture and aid in source control. Blood cultures are pending. Does not appear to be any additional sources of infection. Continue broad spectrum coverage Monitor renal function and vancomycin levels per protocol for therapeutic drug monitoring. Monitor blood cultures for any bacteremia. Plan discussed with [name] and daughter at bedside. Remaining medical and supportive care per primary team.   PLAN:  Continue vancomycin and will change cefepime to ceftriaxone  Await IR evaluation for aspiration.  Monitor blood cultures for bacteremia. Therapeutic drug monitoring of renal function and vancomycin levels per protocol.  Remaining medical and supportive care per primary team.    Principal Problem:   Sepsis ([place]) Active Problems:   HTN (hypertension)   Diabetes ([place])   Elevated LFTs   Centrilobular emphysema ([facility])    Discitis    diltiazem  180 mg Oral Daily   enalapril  20 mg Oral BID   fluticasone furoate-vilanterol  1 puff Inhalation Daily   And   umeclidinium bromide  1 puff Inhalation Daily   folic acid  0.5 mg Oral Daily   gabapentin  300 mg Oral QHS   insulin aspart  0-9 Units Subcutaneous TID WC   pravastatin  40 mg Oral q1800     HPI: [name] is a 76 y.o. female with previous medical history as detailed below and relevant for Type 2 diabetes, rheumatoid arthritis, and lumbar laminectomy/decompression microdiscectomy in March 2019 admitted with severe back pain.  [name] has chronic back pain and was in her normal state of health until 01/25/22 when she had acutely increased levels of pain. Initially believed to be abdominal related and was unrelieved by antacids. Seen in UC with changes on her EKG and sent to the ED. Initially discharged home after Toradol then returned with rigors and found to be febrile with temperature of 102.2 F. CT angio chest without evidence of aortic dissection or aneurysmal dilation. Chest x-ray with no acute abnormalities. MRI thoracic and lumbar spine with extensive epidural enhancement through the thoracic canal compatible with phlegmon; and T8-T9 and T9-10 disc space changes concerning for osteomyelitis/discitis. Started on broad spectrum antibiotics with vancomycin, cefepime and metronidazole. Neurosurgery consulted with recommendations for IR needle biopsy of T* or T9 to guide treatment with no operative intervention needed at the time.   [name] continues to be febrile with max temperature of 102.8 F. Currently on Day 2 of antibiotics with Vancomycin and Cefepime. Blood culture pending. Plan for IR aspiration of disc. ID has been consulted for antibiotic recommendations.    Review of Systems: Review of Systems  Constitutional:  Negative for chills, fever and weight loss.  Respiratory:  Negative for cough, shortness of breath and wheezing.    Cardiovascular:  Negative for chest pain and leg swelling.  Gastrointestinal:  Negative for abdominal pain, constipation, diarrhea, nausea and vomiting.  Musculoskeletal:  Positive for back pain.  Skin:  Negative for rash.    Past Medical History:  Diagnosis Date   Arthritis    "knees, ankles, fingers" (07/06/2018)   Asthma    Chronic lower back pain    Dyspnea    with asthma attacks    Dysrhythmia    Family history of adverse reaction to anesthesia    my first cousin had difficulty waking up    Fibroid    ovarian   GERD (gastroesophageal reflux disease)    Hip osteoarthritis    Left   History of staph infection    in hospital for 11 days/ in 2007   Hyperlipidemia    Hypertension    Radiculopathy    Type II diabetes mellitus ([place])    Wears glasses    Past Surgical History:  Procedure Laterality Date   ANTERIOR CERVICAL DECOMP/DISCECTOMY FUSION N/A 12/11/2015   Procedure: ANTERIOR CERVICAL DECOMPRESSION/DISCECTOMY FUSION 2 LEVELS;  Surgeon: Phylliss Bob, MD;  Location: [facility];  Service: Orthopedics;  Laterality: N/A;  Anterior cervical decompression fusion,  cervical 6-7, cervical 7-thoracic 1 with instrumentation and allograft   BACK SURGERY     CARDIAC CATHETERIZATION  1998   CATARACT EXTRACTION W/ INTRAOCULAR LENS  IMPLANT, BILATERAL Bilateral 2015   [place]   COLONOSCOPY     CRYOABLATION  1988   "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"   INCISION AND DRAINAGE Bilateral 2007>   "cleaned staph out of shoulders"   [place] LAMINECTOMY/DECOMPRESSION MICRODISCECTOMY N/A 01/31/2018   Procedure: LAMINECTOMY AND FORAMINOTOMY LUMBAR TWO- LUMBAR THREE, LUMBAR THREE- LUMBAR FOUR, LUMBAR FOUR- LUMBAR FIVE ;  Surgeon: Newman Pies, MD;  Location: [facility];  Service: Neurosurgery;  Laterality: N/A;   POSTERIOR CERVICAL LAMINECTOMY  ~ [place] Right 02/2003   THUMB FUSION Right  2010   right thumb   TONSILLECTOMY     TOTAL ABDOMINAL HYSTERECTOMY  1998   TAH,BSO   TOTAL HIP ARTHROPLASTY Right 05/18/2018   Procedure: TOTAL HIP ARTHROPLASTY ANTERIOR APPROACH;  Surgeon: Frederik Pear, MD;  Location: [facility];  Service: Orthopedics;  Laterality: Right;   TOTAL HIP ARTHROPLASTY Left 07/06/2018   TOTAL HIP ARTHROPLASTY Left 07/06/2018   Procedure: LEFT TOTAL HIP ARTHROPLASTY ANTERIOR APPROACH;  Surgeon: Frederik Pear, MD;  Location: [facility];  Service: Orthopedics;  Laterality: Left;  Needs RNFA   TUBAL LIGATION      Social History   Tobacco Use   Smoking status: Former    Packs/day: 1.00    Years: 25.00    Pack years: 25.00    Types: Cigarettes    Quit date: 11/23/1992    Years since quitting: 29.1   Smokeless tobacco: Never  Vaping Use   Vaping Use: Never used  Substance Use Topics   Alcohol use: Not Currently   Drug use: Never    Family History  Problem Relation Age of Onset   Diabetes Sister    Diabetes Brother    Diabetes Brother    Diabetes Paternal Uncle    Cancer Paternal Aunt        UTERINE   Hypertension Maternal Grandmother    Heart disease Maternal Grandmother    Colon cancer Neg Hx     Allergies  Allergen Reactions   Ivp Dye [Iodinated Contrast Media] Nausea And Vomiting   Prednisone Other (See Comments)    reflux Other reaction(s): acid reflux Other reaction(s): acid reflux Other reaction(s): acid reflux    OBJECTIVE: Blood pressure 140/85, pulse (!) 127, temperature (!) 102.8 F (39.3 C), temperature source Oral, resp. rate (!) 32, height '4\' 8"'$  (1.422 m), weight 67.1 kg, SpO2 92 %.  Physical Exam Constitutional:      General: She is not in acute distress.    Appearance: She is well-developed.  Cardiovascular:     Rate and Rhythm: Normal rate and regular rhythm.     Heart sounds: Normal heart sounds.  Pulmonary:     Effort: Pulmonary effort is normal.     Breath sounds: Normal breath sounds.  Musculoskeletal:     Comments: Good  bilateral upper and lower motor strength.   Skin:    General: Skin is warm and dry.  Neurological:     Mental Status: She is alert and oriented to person, place, and time.  Psychiatric:        Behavior: Behavior normal.        Thought Content: Thought content normal.  Judgment: Judgment normal.    Lab Results Lab Results  Component Value Date   WBC 25.7 (H) 01/27/2022   HGB 12.5 01/27/2022   HCT 39.0 01/27/2022   MCV 86.9 01/27/2022   PLT 244 01/27/2022    Lab Results  Component Value Date   CREATININE 0.87 01/27/2022   BUN 19 01/27/2022   NA 135 01/27/2022   K 3.9 01/27/2022   CL 101 01/27/2022   CO2 17 (L) 01/27/2022    Lab Results  Component Value Date   ALT 19 01/27/2022   AST 24 01/27/2022   ALKPHOS 94 01/27/2022   BILITOT 0.8 01/27/2022     Microbiology: Recent Results (from the past 240 hour(s))  Resp Panel by RT-PCR (Flu A&B, Covid) Nasopharyngeal Swab     Status: None   Collection Time: 01/26/22  2:06 PM   Specimen: Nasopharyngeal Swab; Nasopharyngeal(NP) swabs in vial transport medium  Result Value Ref Range Status   SARS Coronavirus 2 by RT PCR NEGATIVE NEGATIVE Final    Comment: (NOTE) SARS-CoV-2 target nucleic acids are NOT DETECTED.  The SARS-CoV-2 RNA is generally detectable in upper respiratory specimens during the acute phase of infection. The lowest concentration of SARS-CoV-2 viral copies this assay can detect is 138 copies/mL. A negative result does not preclude SARS-Cov-2 infection and should not be used as the sole basis for treatment or other patient management decisions. A negative result may occur with  improper specimen collection/handling, submission of specimen other than nasopharyngeal swab, presence of viral mutation(s) within the areas targeted by this assay, and inadequate number of viral copies(<138 copies/mL). A negative result must be combined with clinical observations, patient history, and  epidemiological information. The expected result is Negative.  Fact Sheet for Patients:  EntrepreneurPulse.com.au  Fact Sheet for Healthcare Providers:  IncredibleEmployment.be  This test is no t yet approved or cleared by the [place] FDA and  has been authorized for detection and/or diagnosis of SARS-CoV-2 by FDA under an Emergency Use Authorization (EUA). This EUA will remain  in effect (meaning this test can be used) for the duration of the COVID-19 declaration under Section 564(b)(1) of the Act, 21 U.S.C.section 360bbb-3(b)(1), unless the authorization is terminated  or revoked sooner.       Influenza A by PCR NEGATIVE NEGATIVE Final   Influenza B by PCR NEGATIVE NEGATIVE Final    Comment: (NOTE) The Xpert Xpress SARS-CoV-2/FLU/RSV plus assay is intended as an aid in the diagnosis of influenza from Nasopharyngeal swab specimens and should not be used as a sole basis for treatment. Nasal washings and aspirates are unacceptable for Xpert Xpress SARS-CoV-2/FLU/RSV testing.  Fact Sheet for Patients: EntrepreneurPulse.com.au  Fact Sheet for Healthcare Providers: IncredibleEmployment.be  This test is not yet approved or cleared by the [place] FDA and has been authorized for detection and/or diagnosis of SARS-CoV-2 by FDA under an Emergency Use Authorization (EUA). This EUA will remain in effect (meaning this test can be used) for the duration of the COVID-19 declaration under Section 564(b)(1) of the Act, 21 U.S.C. section 360bbb-3(b)(1), unless the authorization is terminated or revoked.  Performed at [facility], [place] [address], [place], [place] [address]    Imaging DG Chest 2 View  Result Date: 01/25/2022 CLINICAL DATA:  Chest pain. EXAM: CHEST - 2 VIEW COMPARISON:  05/09/2018. FINDINGS: Cardiac silhouette is normal in size. No mediastinal or hilar masses or evidence of  adenopathy. Clear lungs.  No pleural effusion or pneumothorax. Skeletal structures are intact.  IMPRESSION: No active cardiopulmonary disease. Electronically Signed   By: Lajean Manes M.D.   On: 01/25/2022 15:53   MR THORACIC SPINE W WO CONTRAST  Result Date: 01/26/2022 CLINICAL DATA:  Osteomyelitis, thoracic; Lumbar radiculopathy, infection suspected, positive xray/CT EXAM: MRI THORACIC AND LUMBAR SPINE WITHOUT AND WITH CONTRAST TECHNIQUE: Multiplanar and multiecho pulse sequences of the thoracic and lumbar spine were obtained without and with intravenous contrast. CONTRAST:  88m GADAVIST GADOBUTROL 1 MMOL/ML IV SOLN COMPARISON:  CT of the chest January 25, 2022. FINDINGS: MRI THORACIC SPINE FINDINGS Alignment: Mildly exaggerated thoracic kyphosis. No substantial sagittal subluxation. Vertebrae: Mild T8-T9 and to lesser extent T9-T10 T2 hyperintense signal within the disc spaces. Adjacent marrow signal change involving the T9 and to a lesser extent inferior T8 and superior T10 vertebral bodies, which is T1 and T2 hypointense and sclerotic on prior CT. Cord: Normal cord signal. Paraspinal and other soft tissues: Linear opacities within bilateral dependent lungs. Disc levels: Fairly extensive abnormal enhancement in the dorsal and ventral canal, compatible with epidural phlegmon. Dorsal enhancement extends from the cervicothoracic junction to thoracolumbar junction. Ventral enhancement extends from approximately T6-T7 to T10-T11. Enhancement measures up to 3 mm in thickness dorsally and 2 mm ventrally. No discrete nonenhancing component to suggest abscess. This is superimposed on multilevel degenerative change, including: Posterior disc bulges at T1-T2, T6-T7, and T12-L1 partially efface ventral CSF with mild canal stenosis at these levels. Posterior disc bulges and ligamentum flavum thickening at T7-T8, T8-T9, T9-T10 and T10-T11 with resulting moderate canal stenosis at these levels. Multilevel facet arthropathy  with moderate to severe foraminal stenosis bilaterally at T8-T9, T9-T10, and on the right at T10-T11, T11-T12 and T12-L1. MRI LUMBAR SPINE FINDINGS Segmentation: Transitional lumbosacral anatomy. Four non rib-bearing lumbar vertebral bodies. Sacralization of L5. Alignment:  Grade 1 anterolisthesis of L3 on L4. Vertebrae: Endplate signal changes about the L3-L4 disc, favored degenerative. No appreciable disc edema. Otherwise, no focal marrow edema in the lumbar spine. Conus medullaris: Extends to the inferior L1 level and appears normal. Paraspinal and other soft tissues: Right renal cysts. Disc levels: T12-L1: Left greater than right facet arthropathy. Resulting moderate to severe left and mild right foraminal stenosis. Patent canal. L1-L2: Broad disc bulge with severe right and moderate left facet arthropathy. Resulting severe bilateral foraminal stenosis. Mild canal stenosis. L2-L3: Severe right facet arthropathy. Prior posterior decompression. Severe right and mild left foraminal stenosis. Patent canal. L3-L4: Severe disc height loss with endplate signal changes. Grade 1 anterolisthesis. Broad disc bulge with endplate spurring. Resulting severe bilateral foraminal stenosis. Prior posterior decompression. Transverse narrowing of the canal with resulting mild to moderate canal stenosis. Narrowing of the right greater than left subarticular recesses. L4-L5: Posterior disc bulge. Left greater than right facet arthropathy. Resulting severe left and moderate right foraminal stenosis. Posterior decompression with patent canal. L5-S1: Transitional anatomy.  Patent canal and foramina. 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 1. Extensive epidural enhancement throughout the thoracic canal, compatible with epidural phlegmon that is detailed above and measures up to 3 mm in thickness dorsally and 2 mm ventrally. 2. Marked sclerotic change of the T9 and  surrounding T8 and T10 endplates with mild edema in the T8-T9 and T9-T10 disc spaces. Findings are suspicious for chronic discitis/osteomyelitis given the above findings. 3. When superimposed on degenerative change, there is multilevel moderate canal stenosis in the lower thoracic spine. Also, mild to moderate canal stenosis at L3-L4 with degenerative disease and prior posterior  decompression at this level. 4. Multilevel moderate to severe foraminal stenosis in the thoracic spine and multilevel severe foraminal stenosis in the lumbar spine, as detailed above. Electronically Signed   By: Margaretha Sheffield M.D.   On: 01/26/2022 17:43   MR Lumbar Spine W Wo Contrast  Result Date: 01/26/2022 CLINICAL DATA:  Osteomyelitis, thoracic; Lumbar radiculopathy, infection suspected, positive xray/CT EXAM: MRI THORACIC AND LUMBAR SPINE WITHOUT AND WITH CONTRAST TECHNIQUE: Multiplanar and multiecho pulse sequences of the thoracic and lumbar spine were obtained without and with intravenous contrast. CONTRAST:  43m GADAVIST GADOBUTROL 1 MMOL/ML IV SOLN COMPARISON:  CT of the chest January 25, 2022. FINDINGS: MRI THORACIC SPINE FINDINGS Alignment: Mildly exaggerated thoracic kyphosis. No substantial sagittal subluxation. Vertebrae: Mild T8-T9 and to lesser extent T9-T10 T2 hyperintense signal within the disc spaces. Adjacent marrow signal change involving the T9 and to a lesser extent inferior T8 and superior T10 vertebral bodies, which is T1 and T2 hypointense and sclerotic on prior CT. Cord: Normal cord signal. Paraspinal and other soft tissues: Linear opacities within bilateral dependent lungs. Disc levels: Fairly extensive abnormal enhancement in the dorsal and ventral canal, compatible with epidural phlegmon. Dorsal enhancement extends from the cervicothoracic junction to thoracolumbar junction. Ventral enhancement extends from approximately T6-T7 to T10-T11. Enhancement measures up to 3 mm in thickness dorsally and 2 mm  ventrally. No discrete nonenhancing component to suggest abscess. This is superimposed on multilevel degenerative change, including: Posterior disc bulges at T1-T2, T6-T7, and T12-L1 partially efface ventral CSF with mild canal stenosis at these levels. Posterior disc bulges and ligamentum flavum thickening at T7-T8, T8-T9, T9-T10 and T10-T11 with resulting moderate canal stenosis at these levels. Multilevel facet arthropathy with moderate to severe foraminal stenosis bilaterally at T8-T9, T9-T10, and on the right at T10-T11, T11-T12 and T12-L1. MRI LUMBAR SPINE FINDINGS Segmentation: Transitional lumbosacral anatomy. Four non rib-bearing lumbar vertebral bodies. Sacralization of L5. Alignment:  Grade 1 anterolisthesis of L3 on L4. Vertebrae: Endplate signal changes about the L3-L4 disc, favored degenerative. No appreciable disc edema. Otherwise, no focal marrow edema in the lumbar spine. Conus medullaris: Extends to the inferior L1 level and appears normal. Paraspinal and other soft tissues: Right renal cysts. Disc levels: T12-L1: Left greater than right facet arthropathy. Resulting moderate to severe left and mild right foraminal stenosis. Patent canal. L1-L2: Broad disc bulge with severe right and moderate left facet arthropathy. Resulting severe bilateral foraminal stenosis. Mild canal stenosis. L2-L3: Severe right facet arthropathy. Prior posterior decompression. Severe right and mild left foraminal stenosis. Patent canal. L3-L4: Severe disc height loss with endplate signal changes. Grade 1 anterolisthesis. Broad disc bulge with endplate spurring. Resulting severe bilateral foraminal stenosis. Prior posterior decompression. Transverse narrowing of the canal with resulting mild to moderate canal stenosis. Narrowing of the right greater than left subarticular recesses. L4-L5: Posterior disc bulge. Left greater than right facet arthropathy. Resulting severe left and moderate right foraminal stenosis. Posterior  decompression with patent canal. L5-S1: Transitional anatomy.  Patent canal and foramina. 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 1. Extensive epidural enhancement throughout the thoracic canal, compatible with epidural phlegmon that is detailed above and measures up to 3 mm in thickness dorsally and 2 mm ventrally. 2. Marked sclerotic change of the T9 and surrounding T8 and T10 endplates with mild edema in the T8-T9 and T9-T10 disc spaces. Findings are suspicious for chronic discitis/osteomyelitis given the above findings. 3. When superimposed on degenerative change, there is multilevel  moderate canal stenosis in the lower thoracic spine. Also, mild to moderate canal stenosis at L3-L4 with degenerative disease and prior posterior decompression at this level. 4. Multilevel moderate to severe foraminal stenosis in the thoracic spine and multilevel severe foraminal stenosis in the lumbar spine, as detailed above. Electronically Signed   By: Margaretha Sheffield M.D.   On: 01/26/2022 17:43   DG Chest Port 1 View  Result Date: 01/26/2022 CLINICAL DATA:  Will sepsis, central chest pain, back pain, shortness of breath EXAM: PORTABLE CHEST 1 VIEW COMPARISON:  Portable exam 1347 hours compared to 01/25/2022 FINDINGS: Upper normal size of cardiac silhouette. Atherosclerotic calcification and tortuosity of thoracic aorta. Lungs clear. No acute infiltrate, pleural effusion, or pneumothorax. Bones demineralized with evidence of prior cervicothoracic fusion and LEFT glenohumeral degenerative changes. IMPRESSION: No acute abnormalities. Aortic Atherosclerosis (ICD10-I70.0). Electronically Signed   By: Lavonia Dana M.D.   On: 01/26/2022 13:56   CT Angio Chest/Abd/Pel for Dissection W and/or Wo Contrast  Result Date: 01/25/2022 CLINICAL DATA:  Acute onset of chest and back pain, initial encounter EXAM: CT ANGIOGRAPHY CHEST, ABDOMEN AND PELVIS TECHNIQUE:  Non-contrast CT of the chest was initially obtained. Multidetector CT imaging through the chest, abdomen and pelvis was performed using the standard protocol during bolus administration of intravenous contrast. Multiplanar reconstructed images and MIPs were obtained and reviewed to evaluate the vascular anatomy. RADIATION DOSE REDUCTION: This exam was performed according to the departmental dose-optimization program which includes automated exposure control, adjustment of the mA and/or kV according to patient size and/or use of iterative reconstruction technique. CONTRAST:  127m OMNIPAQUE IOHEXOL 350 MG/ML SOLN patient was pre-medicated for known contrast allergy COMPARISON:  Chest x-ray from earlier in the same day. FINDINGS: CTA CHEST FINDINGS Cardiovascular: Atherosclerotic calcifications of the thoracic aorta are noted. No aneurysmal dilatation is seen. No findings of dissection are noted. Heart is not significantly enlarged. Mild coronary calcifications are seen. The pulmonary artery as visualized shows no evidence of filling defect to suggest pulmonary embolism. Mediastinum/Nodes: Thoracic inlet is within normal limits. Esophagus as visualized is within normal limits. No sizable hilar or mediastinal adenopathy is noted. Lungs/Pleura: Mild atelectatic changes are noted in the bases. No focal confluent infiltrate is seen. No sizable parenchymal nodule is noted. Minimal emphysematous changes are noted. Musculoskeletal: Postsurgical changes are noted at the cervicothoracic junction. No acute rib abnormality is noted. Degenerative changes of the thoracic spine are seen. Significant sclerosis of the T10 vertebral body is noted of uncertain significance. These changes are new from prior CT in 2007 as well as prior plain film from 2019. These changes may be simply related to underlying degenerative change. Review of the MIP images confirms the above findings. CTA ABDOMEN AND PELVIS FINDINGS VASCULAR Aorta:  Atherosclerotic calcifications are noted without aneurysmal dilatation or dissection. Celiac: Patent without evidence of aneurysm, dissection, vasculitis or significant stenosis. SMA: Patent without evidence of aneurysm, dissection, vasculitis or significant stenosis. 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 No focal stenosis is seen. IMA: Patent without evidence of aneurysm, dissection, vasculitis or significant stenosis. Inflow: Iliacs demonstrate atherosclerotic calcifications without aneurysmal dilatation or dissection. Veins: No specific venous abnormality is seen. Review of the MIP images confirms the above findings. NON-VASCULAR Hepatobiliary: Liver is mildly decreased in attenuation consistent with fatty infiltration. Gallbladder is within normal limits. Pancreas: Unremarkable. No pancreatic ductal dilatation or surrounding inflammatory changes. Spleen: Normal in size without focal abnormality. Adrenals/Urinary Tract: Adrenal glands are within normal limits. Kidneys demonstrate renal cystic change on  the right. No definitive renal calculi are seen. No obstructive changes are noted. The bladder is partially distended. Pelvis is somewhat obscured by scatter from bilateral hip replacements. Stomach/Bowel: Scattered diverticular change of the colon is noted without evidence of diverticulitis. The appendix is within normal limits. Small bowel and stomach are unremarkable. Small duodenal diverticulum is noted adjacent to the head of the pancreas. Lymphatic: No significant lymphadenopathy is seen. Reproductive: Status post hysterectomy. No adnexal masses. Other: No abdominal wall hernia or abnormality. No abdominopelvic ascites. 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 No acute bony abnormality is noted. Degenerative changes of lumbar spine are seen. Review of the MIP images confirms the above findings. IMPRESSION: No evidence of aortic dissection or aneurysmal dilatation. No evidence of  pulmonary emboli. Degenerative changes of the thoracic and lumbar spine as described. Mild fatty infiltration of the liver. Diverticulosis without diverticulitis. Aortic Atherosclerosis (ICD10-I70.0) and Emphysema (ICD10-J43.9). Electronically Signed   By: Inez Catalina M.D.   On: 01/25/2022 21:37     Terri Piedra, NP [facility]  01/27/2022  9:37 AM

## 2022-01-27 NOTE — ED Notes (Signed)
Dr. [place] messaged about fever and trop ?

## 2022-01-27 NOTE — ED Notes (Signed)
Breakfast Order placed ?

## 2022-01-27 NOTE — Assessment & Plan Note (Addendum)
Continue Cardizem enalapril, hydralazine as needed., ?Holding Bumex. ?

## 2022-01-27 NOTE — Progress Notes (Signed)
Troponin level=132, MD notified via text. ?

## 2022-01-27 NOTE — ED Notes (Signed)
Crital value trop of 68 called from lab ?

## 2022-01-28 DIAGNOSIS — R338 Other retention of urine: Secondary | ICD-10-CM | POA: Diagnosis present

## 2022-01-28 DIAGNOSIS — E876 Hypokalemia: Secondary | ICD-10-CM | POA: Diagnosis not present

## 2022-01-28 DIAGNOSIS — M069 Rheumatoid arthritis, unspecified: Secondary | ICD-10-CM | POA: Diagnosis not present

## 2022-01-28 DIAGNOSIS — M4644 Discitis, unspecified, thoracic region: Secondary | ICD-10-CM | POA: Diagnosis not present

## 2022-01-28 DIAGNOSIS — E1122 Type 2 diabetes mellitus with diabetic chronic kidney disease: Secondary | ICD-10-CM | POA: Diagnosis not present

## 2022-01-28 DIAGNOSIS — G062 Extradural and subdural abscess, unspecified: Secondary | ICD-10-CM | POA: Diagnosis not present

## 2022-01-28 DIAGNOSIS — J432 Centrilobular emphysema: Secondary | ICD-10-CM | POA: Diagnosis not present

## 2022-01-28 DIAGNOSIS — A419 Sepsis, unspecified organism: Secondary | ICD-10-CM | POA: Diagnosis not present

## 2022-01-28 LAB — BASIC METABOLIC PANEL
Anion gap: 10 (ref 5–15)
BUN: 13 mg/dL (ref 8–23)
CO2: 24 mmol/L (ref 22–32)
Calcium: 8.8 mg/dL — ABNORMAL LOW (ref 8.9–10.3)
Chloride: 101 mmol/L (ref 98–111)
Creatinine, Ser: 0.7 mg/dL (ref 0.44–1.00)
GFR, Estimated: >60 mL/min (ref >60)
Glucose, Bld: 80 mg/dL (ref 70–99)
Potassium: 3.4 mmol/L — ABNORMAL LOW (ref 3.5–5.1)
Sodium: 135 mmol/L (ref 135–145)

## 2022-01-28 LAB — GLUCOSE, CAPILLARY
Glucose-Capillary: 82 mg/dL (ref 70–99)
Glucose-Capillary: 131 mg/dL — ABNORMAL HIGH (ref 70–99)
Glucose-Capillary: 110 mg/dL — ABNORMAL HIGH (ref 70–99)
Glucose-Capillary: 143 mg/dL — ABNORMAL HIGH (ref 70–99)

## 2022-01-28 LAB — KOH PREP: KOH Prep: NONE SEEN

## 2022-01-28 LAB — CBC
HCT: 35.7 % — ABNORMAL LOW (ref 36.0–46.0)
Hemoglobin: 11.9 g/dL — ABNORMAL LOW (ref 12.0–15.0)
MCH: 27.7 pg (ref 26.0–34.0)
MCHC: 33.3 g/dL (ref 30.0–36.0)
MCV: 83.2 fL (ref 80.0–100.0)
Platelets: 206 10*3/uL (ref 150–400)
RBC: 4.29 MIL/uL (ref 3.87–5.11)
RDW: 17.4 % — ABNORMAL HIGH (ref 11.5–15.5)
WBC: 14.8 10*3/uL — ABNORMAL HIGH (ref 4.0–10.5)
nRBC: 0 % (ref 0.0–0.2)

## 2022-01-28 LAB — URINE CULTURE: Culture: NO GROWTH

## 2022-01-28 LAB — SEDIMENTATION RATE: Sed Rate: 63 mm/hr — ABNORMAL HIGH (ref 0–22)

## 2022-01-28 LAB — C-REACTIVE PROTEIN: CRP: 40 mg/dL — ABNORMAL HIGH (ref <1.0)

## 2022-01-28 MED ORDER — BETHANECHOL CHLORIDE 10 MG PO TABS
10.0000 mg | ORAL_TABLET | Freq: Three times a day (TID) | ORAL | Status: DC
Start: 2022-01-28 — End: 2022-01-31
  Administered 2022-01-28 – 2022-01-31 (×10): 10 mg via ORAL
  Filled 2022-01-28 (×10): qty 1

## 2022-01-28 MED ORDER — OXYCODONE HCL 5 MG PO TABS
5.0000 mg | ORAL_TABLET | ORAL | Status: DC | PRN
  Administered 2022-01-28 – 2022-01-31 (×12): 5 mg via ORAL
  Filled 2022-01-28 (×12): qty 1

## 2022-01-28 MED ORDER — POTASSIUM CHLORIDE CRYS ER 20 MEQ PO TBCR
40.0000 meq | EXTENDED_RELEASE_TABLET | Freq: Once | ORAL | Status: AC
  Administered 2022-01-28: 40 meq via ORAL
  Filled 2022-01-28: qty 2

## 2022-01-28 NOTE — Evaluation (Signed)
Occupational Therapy Evaluation ?Patient Details ?Name: [name] ?MRN: [id number] ?DOB: [birth year] ?Today's Date: 01/28/2022 ? ? ?History of Present Illness 76 year old female with past medical history significant for rheumatoid arthritis, type 2 diabetes mellitus, essential hypertension, osteoarthritis s/p bilateral hip replacements, history of ACDF 2017 and lumbar laminectomy/decompression with microdiscectomy 2019, history of previous MRSA infection requiring 6-week outpatient antibiotic course who presented to [facility] ED on 3/6 for back pain that has been progressing over the last 3 days.  Dx with Sepsis ([place]) discitis/osteomyelitis, epidural phlegmon.  ? ?Clinical Impression ?  ?Patient admitted for the diagnosis above.  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  She lives at home with her spouse, who is unable to physically assist her.  Per the daughter, the patient's spouse is a high fall risk.  The daughter did state the patient could stay with her initially, as she works from home, but it would be beneficial if the patient can mobilize with at least supervision for toileting.  They would like to try for a discharge home with [facility], but she understands SNF may be needed depending on progress.  Pain is the primary deficit.  Currently she is needing Min A for bed mobility, Min Guard for basic transfers, and up to Mod A for lower body ADL from sit/stand level.  OT will follow in the acute setting to help determine ultimate discharge disposition.    ?   ? ?Recommendations for follow up therapy are one component of a multi-disciplinary discharge planning process, led by the attending physician.  Recommendations may be updated based on patient status, additional functional criteria and insurance authorization.  ? ?Follow Up Recommendations ? Home health OT versus SNF depending on progress.   ?  ?Assistance Recommended at Discharge Intermittent  Supervision/Assistance  ?Patient can return home with the following A little help with walking and/or transfers;A little help with bathing/dressing/bathroom;Assistance with cooking/housework ? ?  ?Functional Status Assessment ? Patient has had a recent decline in their functional status and demonstrates the ability to make significant improvements in function in a reasonable and predictable amount of time.  ?Equipment Recommendations ? BSC/3in1  ?  ?Recommendations for Other Services   ? ? ?  ?Precautions / Restrictions Precautions ?Precautions: Back ?Precaution Booklet Issued: No ?Precaution Comments: Education regarding log roll.  Patient has hip kit at home. ?Restrictions ?Weight Bearing Restrictions: No  ? ?  ? ?Mobility Bed Mobility ?Overal bed mobility: Needs Assistance ?Bed Mobility: Sidelying to Sit ?  ?Sidelying to sit: Min assist ?  ?  ?  ?  ?Patient Response: Cooperative ? ?Transfers ?Overall transfer level: Needs assistance ?  ?Transfers: Sit to/from Stand, Bed to chair/wheelchair/BSC ?Sit to Stand: Min guard ?  ?  ?Step pivot transfers: Min guard ?  ?  ?  ?  ? ?  ?Balance Overall balance assessment: Needs assistance ?Sitting-balance support: Feet unsupported, Single extremity supported ?Sitting balance-Leahy Scale: Good ?  ?  ?Standing balance support: Reliant on assistive device for balance ?Standing balance-Leahy Scale: Poor ?  ?  ?  ?  ?  ?  ?  ?  ?  ?  ?  ?  ?   ? ?ADL either performed or assessed with clinical judgement  ? ?ADL   ?  ?  ?Grooming: Wash/dry hands;Wash/dry face;Set up;Sitting ?  ?  ?  ?  ?  ?Upper Body Dressing : Minimal assistance;Sitting ?  ?Lower Body  Dressing: Moderate assistance;Sit to/from stand ?  ?Toilet Transfer: Minimal Teacher, English as a foreign language;Ambulation;Rolling walker (2 wheels) ?  ?  ?  ?  ?  ?  ?   ? ? ? ?Vision Baseline Vision/History: 1 Wears glasses ?Patient Visual Report: No change from baseline ?   ?   ?Perception Perception ?Perception: Within Functional Limits ?   ?Praxis Praxis ?Praxis: Intact ?  ? ?Pertinent Vitals/Pain Pain Assessment ?Pain Assessment: Faces ?Faces Pain Scale: Hurts even more ?Pain Location: mid back ?Pain Descriptors / Indicators: Guarding, Grimacing ?Pain Intervention(s): Monitored during session  ? ? ? ?Hand Dominance Right ?  ?Extremity/Trunk Assessment Upper Extremity Assessment ?Upper Extremity Assessment: Overall WFL for tasks assessed ?  ?Lower Extremity Assessment ?Lower Extremity Assessment: Defer to PT evaluation ?  ?Cervical / Trunk Assessment ?Cervical / Trunk Assessment: Kyphotic ?  ?Communication Communication ?Communication: No difficulties ?  ?Cognition Arousal/Alertness: Awake/alert ?Behavior During Therapy: [facility] for tasks assessed/performed ?Overall Cognitive Status: Within Functional Limits for tasks assessed ?  ?  ?  ?  ?  ?  ?  ?  ?  ?  ?  ?  ?  ?  ?  ?  ?  ?  ?  ?General Comments   O2 at 92% on RA.  Supplemental O2 left near her.   ? ?  ?   ?  ?    ? ? ?Home Living Family/patient expects to be discharged to:: Private residence ?Living Arrangements: Spouse/significant other ?Available Help at Discharge: Family;Available PRN/intermittently;Other (Comment) (Depending on if she returns home, or to daughter's home.) ?Type of Home: House ?  ?  ?  ?Home Layout: One level ?  ?  ?Bathroom Shower/Tub: Tub/shower unit ?  ?Bathroom Toilet: Handicapped height ?Bathroom Accessibility: Yes ?How Accessible: Accessible via walker ?Home Equipment: Rollator (4 wheels);Cane - quad;Cane - single point;Shower seat - built in;Adaptive equipment;Hand held shower head ?Adaptive Equipment: Reacher;Sock aid ?  ?  ? ?  ?Prior Functioning/Environment Prior Level of Function : Independent/Modified Independent;Working/employed;Driving ?  ?  ?  ?  ?  ?  ?  ?ADLs Comments: Works as a Engineer, civil (consulting) for kindergarten class. ?  ? ?  ?  ?OT Problem List: Decreased range of motion;Decreased activity tolerance;Impaired balance (sitting and/or standing);Decreased knowledge of  use of DME or AE;Pain ?  ?   ?OT Treatment/Interventions: Self-care/ADL training;Therapeutic activities;Patient/family education;DME and/or AE instruction;Balance training  ?  ?OT Goals(Current goals can be found in the care plan section) Acute Rehab OT Goals ?Patient Stated Goal: [facility] to get home with [facility] ?OT Goal Formulation: With patient ?Time For Goal Achievement: 02/11/22 ?Potential to Achieve Goals: Good ?ADL Goals ?Pt Will Perform Grooming: with modified independence;standing ?Pt Will Perform Lower Body Bathing: sit to/from stand;with min assist ?Pt Will Perform Lower Body Dressing: with modified independence;sit to/from stand;with adaptive equipment ?Pt Will Transfer to Toilet: with modified independence;regular height toilet;ambulating  ?OT Frequency: Min 2X/week ?  ? ?Co-evaluation   ?  ?  ?  ?  ? ?  ?AM-PAC OT "6 Clicks" Daily Activity     ?Outcome Measure Help from another person eating meals?: None ?Help from another person taking care of personal grooming?: None ?Help from another person toileting, which includes using toliet, bedpan, or urinal?: A Little ?Help from another person bathing (including washing, rinsing, drying)?: A Lot ?Help from another person to put on and taking off regular upper body clothing?: A Little ?Help from another person to put on and taking off regular lower body  clothing?: A Lot ?6 Click Score: 18 ?  ?End of Session Equipment Utilized During Treatment: Rolling walker (2 wheels) ?Nurse Communication: Mobility status ? ?Activity Tolerance: Patient tolerated treatment well ?Patient left: in chair;with call bell/phone within reach;with family/visitor present ? ?OT Visit Diagnosis: Unsteadiness on feet (R26.81);Pain  ?              ?Time: 5188-4166 ?OT Time Calculation (min): 24 min ?Charges:  OT General Charges ?$OT Visit: 1 Visit ?OT Evaluation ?$OT Eval Moderate Complexity: 1 Mod ?OT Treatments ?$Self Care/Home Management : 8-22 mins ? ?01/28/2022 ? ?RP, OTR/L ? ?Acute  Rehabilitation Services ? ?Office:  [phone] ? ? ?[name] ?01/28/2022, 12:33 PM ?

## 2022-01-28 NOTE — Assessment & Plan Note (Addendum)
Overnight, patient required to in and out catheterizations for acute urinary retention.   ?Likely complicated by her poor mobility with epidural abscess/discitis, osteomyelitis. ?Continue Foley catheter.  Consider voiding trial 3/11 ?

## 2022-01-28 NOTE — Progress Notes (Signed)
Subjective: The patient is alert and pleasant.  She is in no apparent distress.  ID is seeing the patient now.  Objective: Vital signs in last 24 hours: Temp:  [99.3 F (37.4 C)-100.6 F (38.1 C)] 99.5 F (37.5 C) (03/08 0753) Pulse Rate:  [91-119] 100 (03/08 0753) Resp:  [17-28] 20 (03/08 0753) BP: (116-164)/(72-107) 153/82 (03/08 0753) SpO2:  [92 %-99 %] 96 % (03/08 0753) Estimated body mass index is 33.18 kg/m as calculated from the following:   Height as of this encounter: 4\' 8"  (1.422 m).   Weight as of this encounter: 67.1 kg.   Intake/Output from previous day: 03/07 0701 - 03/08 0700 In: 200 [IV Piggyback:200] Out: 1100 [Urine:1100] Intake/Output this shift: No intake/output data recorded.  Physical exam the patient is alert and pleasant.  She is moving her lower extremities well.  Lab Results: Recent Labs    01/27/22 0408 01/28/22 0651  WBC 25.7* 14.8*  HGB 12.5 11.9*  HCT 39.0 35.7*  PLT 244 206   BMET Recent Labs    01/27/22 0408 01/28/22 0651  NA 135 135  K 3.9 3.4*  CL 101 101  CO2 17* 24  GLUCOSE 91 80  BUN 19 13  CREATININE 0.87 0.70  CALCIUM 8.9 8.8*    Studies/Results: MR THORACIC SPINE W WO CONTRAST  Result Date: 01/26/2022 CLINICAL DATA:  Osteomyelitis, thoracic; Lumbar radiculopathy, infection suspected, positive xray/CT EXAM: MRI THORACIC AND LUMBAR SPINE WITHOUT AND WITH CONTRAST TECHNIQUE: Multiplanar and multiecho pulse sequences of the thoracic and lumbar spine were obtained without and with intravenous contrast. CONTRAST:  7mL GADAVIST GADOBUTROL 1 MMOL/ML IV SOLN COMPARISON:  CT of the chest January 25, 2022. FINDINGS: MRI THORACIC SPINE FINDINGS Alignment: Mildly exaggerated thoracic kyphosis. No substantial sagittal subluxation. Vertebrae: Mild T8-T9 and to lesser extent T9-T10 T2 hyperintense signal within the disc spaces. Adjacent marrow signal change involving the T9 and to a lesser extent inferior T8 and superior T10 vertebral  bodies, which is T1 and T2 hypointense and sclerotic on prior CT. Cord: Normal cord signal. Paraspinal and other soft tissues: Linear opacities within bilateral dependent lungs. Disc levels: Fairly extensive abnormal enhancement in the dorsal and ventral canal, compatible with epidural phlegmon. Dorsal enhancement extends from the cervicothoracic junction to thoracolumbar junction. Ventral enhancement extends from approximately T6-T7 to T10-T11. Enhancement measures up to 3 mm in thickness dorsally and 2 mm ventrally. No discrete nonenhancing component to suggest abscess. This is superimposed on multilevel degenerative change, including: Posterior disc bulges at T1-T2, T6-T7, and T12-L1 partially efface ventral CSF with mild canal stenosis at these levels. Posterior disc bulges and ligamentum flavum thickening at T7-T8, T8-T9, T9-T10 and T10-T11 with resulting moderate canal stenosis at these levels. Multilevel facet arthropathy with moderate to severe foraminal stenosis bilaterally at T8-T9, T9-T10, and on the right at T10-T11, T11-T12 and T12-L1. MRI LUMBAR SPINE FINDINGS Segmentation: Transitional lumbosacral anatomy. Four non rib-bearing lumbar vertebral bodies. Sacralization of L5. Alignment:  Grade 1 anterolisthesis of L3 on L4. Vertebrae: Endplate signal changes about the L3-L4 disc, favored degenerative. No appreciable disc edema. Otherwise, no focal marrow edema in the lumbar spine. Conus medullaris: Extends to the inferior L1 level and appears normal. Paraspinal and other soft tissues: Right renal cysts. Disc levels: T12-L1: Left greater than right facet arthropathy. Resulting moderate to severe left and mild right foraminal stenosis. Patent canal. L1-L2: Broad disc bulge with severe right and moderate left facet arthropathy. Resulting severe bilateral foraminal stenosis. Mild canal stenosis. L2-L3: Severe right  facet arthropathy. Prior posterior decompression. Severe right and mild left foraminal  stenosis. Patent canal. L3-L4: Severe disc height loss with endplate signal changes. Grade 1 anterolisthesis. Broad disc bulge with endplate spurring. Resulting severe bilateral foraminal stenosis. Prior posterior decompression. Transverse narrowing of the canal with resulting mild to moderate canal stenosis. Narrowing of the right greater than left subarticular recesses. L4-L5: Posterior disc bulge. Left greater than right facet arthropathy. Resulting severe left and moderate right foraminal stenosis. Posterior decompression with patent canal. L5-S1: Transitional anatomy.  Patent canal and foramina. 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 1. Extensive epidural enhancement throughout the thoracic canal, compatible with epidural phlegmon that is detailed above and measures up to 3 mm in thickness dorsally and 2 mm ventrally. 2. Marked sclerotic change of the T9 and surrounding T8 and T10 endplates with mild edema in the T8-T9 and T9-T10 disc spaces. Findings are suspicious for chronic discitis/osteomyelitis given the above findings. 3. When superimposed on degenerative change, there is multilevel moderate canal stenosis in the lower thoracic spine. Also, mild to moderate canal stenosis at L3-L4 with degenerative disease and prior posterior decompression at this level. 4. Multilevel moderate to severe foraminal stenosis in the thoracic spine and multilevel severe foraminal stenosis in the lumbar spine, as detailed above. Electronically Signed   By: Feliberto Harts M.D.   On: 01/26/2022 17:43   MR Lumbar Spine W Wo Contrast  Result Date: 01/26/2022 CLINICAL DATA:  Osteomyelitis, thoracic; Lumbar radiculopathy, infection suspected, positive xray/CT EXAM: MRI THORACIC AND LUMBAR SPINE WITHOUT AND WITH CONTRAST TECHNIQUE: Multiplanar and multiecho pulse sequences of the thoracic and lumbar spine were obtained without and with intravenous contrast.  CONTRAST:  7mL GADAVIST GADOBUTROL 1 MMOL/ML IV SOLN COMPARISON:  CT of the chest January 25, 2022. FINDINGS: MRI THORACIC SPINE FINDINGS Alignment: Mildly exaggerated thoracic kyphosis. No substantial sagittal subluxation. Vertebrae: Mild T8-T9 and to lesser extent T9-T10 T2 hyperintense signal within the disc spaces. Adjacent marrow signal change involving the T9 and to a lesser extent inferior T8 and superior T10 vertebral bodies, which is T1 and T2 hypointense and sclerotic on prior CT. Cord: Normal cord signal. Paraspinal and other soft tissues: Linear opacities within bilateral dependent lungs. Disc levels: Fairly extensive abnormal enhancement in the dorsal and ventral canal, compatible with epidural phlegmon. Dorsal enhancement extends from the cervicothoracic junction to thoracolumbar junction. Ventral enhancement extends from approximately T6-T7 to T10-T11. Enhancement measures up to 3 mm in thickness dorsally and 2 mm ventrally. No discrete nonenhancing component to suggest abscess. This is superimposed on multilevel degenerative change, including: Posterior disc bulges at T1-T2, T6-T7, and T12-L1 partially efface ventral CSF with mild canal stenosis at these levels. Posterior disc bulges and ligamentum flavum thickening at T7-T8, T8-T9, T9-T10 and T10-T11 with resulting moderate canal stenosis at these levels. Multilevel facet arthropathy with moderate to severe foraminal stenosis bilaterally at T8-T9, T9-T10, and on the right at T10-T11, T11-T12 and T12-L1. MRI LUMBAR SPINE FINDINGS Segmentation: Transitional lumbosacral anatomy. Four non rib-bearing lumbar vertebral bodies. Sacralization of L5. Alignment:  Grade 1 anterolisthesis of L3 on L4. Vertebrae: Endplate signal changes about the L3-L4 disc, favored degenerative. No appreciable disc edema. Otherwise, no focal marrow edema in the lumbar spine. Conus medullaris: Extends to the inferior L1 level and appears normal. Paraspinal and other soft tissues:  Right renal cysts. Disc levels: T12-L1: Left greater than right facet arthropathy. Resulting moderate to severe left and mild right foraminal stenosis. Patent canal.  L1-L2: Broad disc bulge with severe right and moderate left facet arthropathy. Resulting severe bilateral foraminal stenosis. Mild canal stenosis. L2-L3: Severe right facet arthropathy. Prior posterior decompression. Severe right and mild left foraminal stenosis. Patent canal. L3-L4: Severe disc height loss with endplate signal changes. Grade 1 anterolisthesis. Broad disc bulge with endplate spurring. Resulting severe bilateral foraminal stenosis. Prior posterior decompression. Transverse narrowing of the canal with resulting mild to moderate canal stenosis. Narrowing of the right greater than left subarticular recesses. L4-L5: Posterior disc bulge. Left greater than right facet arthropathy. Resulting severe left and moderate right foraminal stenosis. Posterior decompression with patent canal. L5-S1: Transitional anatomy.  Patent canal and foramina. 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 1. Extensive epidural enhancement throughout the thoracic canal, compatible with epidural phlegmon that is detailed above and measures up to 3 mm in thickness dorsally and 2 mm ventrally. 2. Marked sclerotic change of the T9 and surrounding T8 and T10 endplates with mild edema in the T8-T9 and T9-T10 disc spaces. Findings are suspicious for chronic discitis/osteomyelitis given the above findings. 3. When superimposed on degenerative change, there is multilevel moderate canal stenosis in the lower thoracic spine. Also, mild to moderate canal stenosis at L3-L4 with degenerative disease and prior posterior decompression at this level. 4. Multilevel moderate to severe foraminal stenosis in the thoracic spine and multilevel severe foraminal stenosis in the lumbar spine, as detailed above. Electronically  Signed   By: Feliberto Harts M.D.   On: 01/26/2022 17:43   IR Fluoro Guide Ndl Plmt / BX  Result Date: 01/27/2022 INDICATION: [name] is a 76 y.o. female with a past medical history significant for RA, DM, HTN who presented to the ED on 01/26/22 with worsening upper back pain that radiates to her chest X 3 day. Found to be tachycardic and febrile with leukocystosis and elevated lactic acid. 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 She comes to our service today for a T9 vertebral body core bone biopsy with T8-9 disc aspiration. EXAM: Fluoroscopy guided T9 core bone biopsy and T8-9 fine-needle aspiration MEDICATIONS: Ancef 1 g IV. ANESTHESIA/SEDATION: Moderate (conscious) sedation was employed during this procedure. A total of Versed 2 mg and Fentanyl 75 mcg was administered intravenously by the radiology nurse. Total intra-service moderate Sedation Time: 45 minutes. The patient's level of consciousness and vital signs were monitored continuously by radiology nursing throughout the procedure under my direct supervision. FLUOROSCOPY: Radiation Exposure Index (as provided by the fluoroscopic device): 1099 mGy Kerma COMPLICATIONS: None immediate. PROCEDURE: Informed written consent was obtained from the patient after a thorough discussion of the procedural risks, benefits and alternatives. All questions were addressed. Maximal Sterile Barrier Technique was utilized including caps, mask, sterile gowns, sterile gloves, sterile drape, hand hygiene and skin antiseptic. A timeout was performed prior to the initiation of the procedure. The patient was placed in prone position on the angiography table. The thoracic spine region was prepped and draped in a sterile fashion. Under fluoroscopy, the T9 vertebral body was delineated and the skin  area was marked. The skin was infiltrated with a 1% Lidocaine approximately 4 cm lateral to the spinous process projection on the left. Using a 22-gauge spinal needle, the soft issue and the peripedicular space and periosteum were infiltrated with Bupivacaine 0.5%. A skin  incision was made at the access site. Subsequently, an 11-gauge Kyphon trocar was inserted under fluoroscopic guidance until contact with the pedicle was obtained. The trocar was inserted under light hammer tapping into the pedicle until the posterior boundaries of the vertebral body was reached. The diamond mandrill was removed and one core biopsy wasobtained. Attempt to advance a 20 gauge in a 22 gauge spinal needle through the endplate into the disc space proved unsuccessful. The cannula was later removed. Then, under fluoroscopy guidance and using extra pedicular approach a 22 gauge x 20 cm spinal needle was advanced into the T8-9 intervertebral disc. Two fine-needle aspiration samples were obtained with small amount of blood tinged fluid obtained. The access sites were cleaned and covered with a sterile bandage. IMPRESSION: Successful fluoroscopy guided T9 core bone biopsy and T8-9 disc fine-needle aspiration. Core biopsy sample was sent for tissue exam and fine needle aspiration samples were sent for Gram stain and culture. Electronically Signed   By: Baldemar Lenis M.D.   On: 01/27/2022 15:18   IR Fluoro Guide Ndl Plmt / BX  Result Date: 01/27/2022 INDICATION: [name] is a 76 y.o. female with a past medical history significant for RA, DM, HTN who presented to the ED on 01/26/22 with worsening upper back pain that radiates to her chest X 3 day. Found to be tachycardic and febrile with leukocystosis and elevated lactic acid. MR Lumbar and thoracic spine from 01/26/22 showed marked sclerotic change of the T9 and surrounding T8 and T10 endplates with mild edema in the T8-T9 and T9-T10 disc spaces, concerning for chronic  discitis/osteomyelitis. She comes to our service today for a T9 vertebral body core bone biopsy with T8-9 disc aspiration. EXAM: Fluoroscopy guided T9 core bone biopsy and T8-9 fine-needle aspiration MEDICATIONS: Ancef 1 g IV. ANESTHESIA/SEDATION: Moderate (conscious) sedation was employed during this procedure. A total of Versed 2 mg and Fentanyl 75 mcg was administered intravenously by the radiology nurse. Total intra-service moderate Sedation Time: 45 minutes. The patient's level of consciousness and vital signs were monitored continuously by radiology nursing throughout the procedure under my direct supervision. FLUOROSCOPY: Radiation Exposure Index (as provided by the fluoroscopic device): 1099 mGy Kerma COMPLICATIONS: None immediate. PROCEDURE: Informed written consent was obtained from the patient after a thorough discussion of the procedural risks, benefits and alternatives. All questions were addressed. Maximal Sterile Barrier Technique was utilized including caps, mask, sterile gowns, sterile gloves, sterile drape, hand hygiene and skin antiseptic. A timeout was performed prior to the initiation of the procedure. The patient was placed in prone position on the angiography table. The thoracic spine region was prepped and draped in a sterile fashion. Under fluoroscopy, the T9 vertebral body was delineated and the skin area was marked. The skin was infiltrated with a 1% Lidocaine approximately 4 cm lateral to the spinous process projection on the left. Using a 22-gauge spinal needle, the soft issue and the peripedicular space and periosteum were infiltrated with Bupivacaine 0.5%. A skin incision was made at the access site. Subsequently, an 11-gauge Kyphon trocar was inserted under fluoroscopic guidance until contact with the pedicle was obtained. The trocar was inserted under light hammer tapping into the pedicle until the posterior boundaries of the vertebral body was reached. The diamond mandrill was  removed and one core biopsy wasobtained. Attempt to advance a 20 gauge in a 22 gauge spinal needle through the endplate into the disc space proved unsuccessful. The cannula was later removed. Then, under fluoroscopy  guidance and using extra pedicular approach a 22 gauge x 20 cm spinal needle was advanced into the T8-9 intervertebral disc. Two fine-needle aspiration samples were obtained with small amount of blood tinged fluid obtained. The access sites were cleaned and covered with a sterile bandage. IMPRESSION: Successful fluoroscopy guided T9 core bone biopsy and T8-9 disc fine-needle aspiration. Core biopsy sample was sent for tissue exam and fine needle aspiration samples were sent for Gram stain and culture. Electronically Signed   By: Baldemar Lenis M.D.   On: 01/27/2022 15:18   DG Chest Port 1 View  Result Date: 01/26/2022 CLINICAL DATA:  Will sepsis, central chest pain, back pain, shortness of breath EXAM: PORTABLE CHEST 1 VIEW COMPARISON:  Portable exam 1347 hours compared to 01/25/2022 FINDINGS: Upper normal size of cardiac silhouette. Atherosclerotic calcification and tortuosity of thoracic aorta. Lungs clear. No acute infiltrate, pleural effusion, or pneumothorax. Bones demineralized with evidence of prior cervicothoracic fusion and LEFT glenohumeral degenerative changes. IMPRESSION: No acute abnormalities. Aortic Atherosclerosis (ICD10-I70.0). Electronically Signed   By: Ulyses Southward M.D.   On: 01/26/2022 13:56   ECHOCARDIOGRAM COMPLETE  Result Date: 01/27/2022    ECHOCARDIOGRAM REPORT   Patient Name:   [name] Date of Exam: 01/27/2022 Medical Rec #:  [id number]      Height:       56.0 in Accession #:    [id number]     Weight:       148.0 lb Date of Birth:  22-Dec-1945      BSA:          1.562 m Patient Age:    75 years       BP:           140/85 mmHg Patient Gender: F              HR:           127 bpm. Exam Location:  Inpatient Procedure: 2D Echo Indications:    Fever  History:         Patient has no prior history of Echocardiogram examinations.                 Signs/Symptoms:Shortness of Breath; Risk Factors:Dyslipidemia,                 Hypertension and Diabetes.  Sonographer:    Devonne Doughty Referring Phys: [id number] [facility]  Sonographer Comments: Technically difficult study due to poor echo windows. IMPRESSIONS  1. Left ventricular ejection fraction, by estimation, is 60 to 65%. Left ventricular ejection fraction by PLAX is 63 %. The left ventricle has normal function. The left ventricle has no regional wall motion abnormalities. There is moderate asymmetric left ventricular hypertrophy of the basal-septal segment. Left ventricular diastolic parameters are consistent with Grade I diastolic dysfunction (impaired relaxation). Elevated left ventricular end-diastolic pressure. The E/e' is 16.  2. Right ventricular systolic function is normal. The right ventricular size is normal. There is normal pulmonary artery systolic pressure. The estimated right ventricular systolic pressure is 27.8 mmHg.  3. The mitral valve is grossly normal. Trivial mitral valve regurgitation.  4. The aortic valve is tricuspid. Aortic valve regurgitation is not visualized.  5. The inferior vena cava is normal in size with greater than 50% respiratory variability, suggesting right atrial pressure of 3 mmHg. Comparison(s): No prior Echocardiogram. FINDINGS  Left Ventricle: Left ventricular ejection fraction, by estimation, is 60 to 65%. Left ventricular ejection fraction by PLAX is 63 %. The  left ventricle has normal function. The left ventricle has no regional wall motion abnormalities. The left ventricular internal cavity size was normal in size. There is moderate asymmetric left ventricular hypertrophy of the basal-septal segment. Left ventricular diastolic parameters are consistent with Grade I diastolic dysfunction (impaired relaxation). Elevated left ventricular end-diastolic pressure. The E/e' is 16.  Right Ventricle: The right ventricular size is normal. No increase in right ventricular wall thickness. Right ventricular systolic function is normal. There is normal pulmonary artery systolic pressure. The tricuspid regurgitant velocity is 2.49 m/s, and  with an assumed right atrial pressure of 3 mmHg, the estimated right ventricular systolic pressure is 27.8 mmHg. Left Atrium: Left atrial size was normal in size. Right Atrium: Right atrial size was normal in size. Pericardium: There is no evidence of pericardial effusion. Mitral Valve: The mitral valve is grossly normal. Trivial mitral valve regurgitation. MV peak gradient, 9.9 mmHg. The mean mitral valve gradient is 4.0 mmHg. Tricuspid Valve: The tricuspid valve is grossly normal. Tricuspid valve regurgitation is trivial. Aortic Valve: The aortic valve is tricuspid. Aortic valve regurgitation is not visualized. Aortic valve mean gradient measures 5.0 mmHg. Aortic valve peak gradient measures 10.9 mmHg. Aortic valve area, by VTI measures 2.59 cm. Pulmonic Valve: The pulmonic valve was normal in structure. Pulmonic valve regurgitation is not visualized. Aorta: The aortic root and ascending aorta are structurally normal, with no evidence of dilitation. Venous: The inferior vena cava is normal in size with greater than 50% respiratory variability, suggesting right atrial pressure of 3 mmHg. IAS/Shunts: No atrial level shunt detected by color flow Doppler.  LEFT VENTRICLE PLAX 2D LV EF:         Left            Diastology                ventricular     LV e' medial:    4.90 cm/s                ejection        LV E/e' medial:  15.2                fraction by     LV e' lateral:   4.35 cm/s                PLAX is 63      LV E/e' lateral: 17.2                %. LVIDd:         3.30 cm LVIDs:         2.20 cm LV PW:         1.10 cm LV IVS:        1.40 cm LVOT diam:     1.90 cm LV SV:         60 LV SV Index:   38 LVOT Area:     2.84 cm  RIGHT VENTRICLE             IVC RV  Basal diam:  3.20 cm     IVC diam: 1.80 cm RV Mid diam:    3.20 cm RV S prime:     16.60 cm/s TAPSE (M-mode): 1.8 cm LEFT ATRIUM             Index        RIGHT ATRIUM           Index LA diam:  2.90 cm 1.86 cm/m   RA Area:     11.30 cm LA Vol (A2C):   44.3 ml 28.36 ml/m  RA Volume:   21.20 ml  13.57 ml/m LA Vol (A4C):   39.4 ml 25.23 ml/m LA Biplane Vol: 43.1 ml 27.59 ml/m  AORTIC VALVE AV Area (Vmax):    2.35 cm AV Area (Vmean):   2.33 cm AV Area (VTI):     2.59 cm AV Vmax:           165.00 cm/s AV Vmean:          106.000 cm/s AV VTI:            0.232 m AV Peak Grad:      10.9 mmHg AV Mean Grad:      5.0 mmHg LVOT Vmax:         137.00 cm/s LVOT Vmean:        87.100 cm/s LVOT VTI:          0.212 m LVOT/AV VTI ratio: 0.91  AORTA Ao Root diam: 2.70 cm Ao Asc diam:  3.10 cm MITRAL VALVE                TRICUSPID VALVE MV Area (PHT): 3.30 cm     TR Peak grad:   24.8 mmHg MV Area VTI:   2.62 cm     TR Vmax:        249.00 cm/s MV Peak grad:  9.9 mmHg MV Mean grad:  4.0 mmHg     SHUNTS MV Vmax:       1.57 m/s     Systemic VTI:  0.21 m MV Vmean:      94.0 cm/s    Systemic Diam: 1.90 cm MV Decel Time: 230 msec MV E velocity: 74.70 cm/s MV A velocity: 130.00 cm/s MV E/A ratio:  0.57 Zoila Shutter MD Electronically signed by Zoila Shutter MD Signature Date/Time: 01/27/2022/5:24:42 PM    Final     Assessment/Plan: Discitis, osteomyelitis, epidural abscess: The patient does not have any significant neural compression.  She does not need surgery presently.  Cultures are pending.  I would recommend treatment with antibiotics.  LOS: 2 days     Cristi Loron 01/28/2022, 9:23 AM     Patient ID: [name], female   DOB: Dec 27, 1945, 76 y.o.   MRN: [id number]

## 2022-01-28 NOTE — Progress Notes (Signed)
?  Transition of Care (TOC) Screening Note ? ? ?Patient Details  ?Name: [name] ?Date of Birth: 1946-01-05 ? ? ?Transition of Care (TOC) CM/SW Contact:    ?Vinie Sill, LCSW ?Phone Number: ?01/28/2022, 10:22 AM ? ? ? ?Transition of Care Department [facility]) has reviewed patient and no TOC needs have been identified at this time. We will continue to monitor patient advancement through interdisciplinary progression rounds. If new patient transition needs arise, please place a TOC consult. ? ? ?

## 2022-01-28 NOTE — Progress Notes (Addendum)
PROGRESS NOTE    [name]  AYT:[id number] DOB: [birth year] DOA: 01/26/2022 PCP: Anne Seashore, MD    Brief Narrative:  [name] is a 76 year old female with past medical history significant for rheumatoid arthritis, type 2 diabetes mellitus, essential hypertension, osteoarthritis s/p bilateral hip replacements, history of ACDF 2017 and lumbar laminectomy/decompression with microdiscectomy 2019, history of previous MRSA infection requiring 6-week outpatient antibiotic course who presented to [facility] ED on 3/6 for back pain that has been progressing over the last 3 days.  Patient reports onset 3 days ago with pain in upper back with radiation to the chest.  Denies any incontinence of urine, bowel; no lower extremity weakness and no paresthesias.  In the ED, temperature 102.2 F, HR 127, RR 25, BP 156/90, SPO2 94% on room air.  WBC 25.8, hemoglobin 13.0, platelet count 256.  Sodium 137, potassium 3.0, chloride 100, CO2 25, glucose 116, BUN 19, creatinine 0.94.  Lipase 26, AST 20, ALT 21, total bilirubin 1.0.  Lactic acid 2.7.  Cova-19 PCR negative.  Influenza A/B PCR negative.  INR 1.1.  Urinalysis unrevealing.  Chest x-ray with no acute cardiopulmonary disease process.  MR L/T-spine with and without contrast with extensive epidural enhancement thoracic canal compatible with epidural phlegmon, sclerotic changes T8, T9, T10 with mild edema suspicious for discitis/osteomyelitis, mild/moderate lumbar stenosis and moderate to severe foraminal stenosis thoracic spine.  EDP consulted neurosurgery, Dr. Christella Noa who reviewed imaging studies and recommended IR for needle biopsy of T8 or T9 and to continue antibiotic treatment.  TRH consulted for further evaluation and management of osteomyelitis/discitis with concern of epidural phlegmon.      Assessment & Plan:   Assessment and Plan: * Sepsis ([place]) discitis/osteomyelitis, epidural phlegmon Patient presenting to the ED with progressive back pain.   Found to be febrile, with leukocytosis, elevated lactic acid on admission with tachycardia, tachypnea.  MR L/T-spine with and without contrast with extensive epidural enhancement thoracic canal compatible with epidural phlegmon, sclerotic changes T8, T9, T10 with mild edema suspicious for discitis/osteomyelitis, mild/moderate lumbar stenosis and moderate to severe foraminal stenosis thoracic spine.  Likely complicated by history of immunosuppression with history of rheumatoid arthritis on immunosuppression with Enbrel/methotrexate.  Evaluated by neurosurgery, who recommended nonoperative management with IR consultation for biopsy/aspiration and antibiotic therapy.  Patient was given aggressive IV fluid resuscitation with 30 cc/kg IV fluids.  TTE with LVEF 60 to 35%, grade 1 diastolic dysfunction, no LV regional wall motion abnormalities, trivial MR, IVC normal.  Underwent T9 core bone biopsy and T8-9 disc aspiration by IR on 3/7.  Lactic acidosis now resolved. --Neurosurgery and infectious disease following, appreciate assistance --WBC 27.5>14.8 --Holding home Enbrel/methotrexate --Blood cultures x2: No growth x2 days --T8-9 culture, no organisms/WBC seen, further pending --AFB and acid-fast culture: Pending --Vancomycin, pharmacy consulted for dosing/monitoring --Ceftriaxone -- CBC daily  HTN (hypertension) -- Cardizem 100 mg p.o. daily -- Enalapril 20 mg p.o. twice daily -- Hydralazine '25mg'$  PO q8h PRN SBP >165 or DBP >110 -- Holding home Bumex -- Continue monitor BP closely in the setting of sepsis as above  Diabetes (HCC) Home regimen includes metformin 500 mg p.o. twice daily.  Most recent hemoglobin A1c in EMR 5.3 2019. --Update hemoglobin A1c --Hold oral hypoglycemics while inpatient --SSI for coverage --CBG before every meal/at bedtime  Centrilobular emphysema ([place]) On Trelegy Ellipta outpatient. --Continue Incruse Ellipta Breo Ellipta as hospital substitutions and  patient. --Albuterol neb every 4 hours as needed shortness of breath/wheezing  Rheumatoid arthritis [facility]) Patient  is on methotrexate and Enbrel outpatient. -- Hold immunosuppressive's in the setting of sepsis as above  Hyperlipidemia On lovastatin 40 mg nightly outpatient. --Continue pravastatin as hospital substitution inpatient  Hypokalemia Potassium 3.4 this morning, will replete.  Repeat electrolytes in a.m. to include magnesium.  Acute urinary retention Overnight, patient required to in and out catheterizations for acute urinary retention.  Likely complicated by her poor mobility with epidural abscess/discitis, osteomyelitis. --Continue bladder scan as needed, if requires another in and out catheterization, likely will place Foley catheter.  Epidural abscess Underwent IR bone biopsy/aspiration as above.  Neurosurgery following, recommend nonoperative management with antibiotics.  ID following.  On vancomycin and ceftriaxone as above  Discitis -- Treatment as above     DVT prophylaxis: SCDs Start: 01/26/22 2326    Code Status: Full Code Family Communication: Updated patient's daughter present at bedside this morning  Disposition Plan:  Level of care: Progressive Status is: Inpatient Remains inpatient appropriate because: Pending IR biopsy/aspiration for discitis, epidural phlegmon, IV antibiotics, awaiting further recommendations from infectious disease, neurosurgery    Consultants:  Neurosurgery, Dr. Christella Noa, Dr. Arnoldo Morale Infectious disease, Dr. Montel Culver  Procedures:  None  Antimicrobials:  Vancomycin 3/6>> Ceftriaxone 3/6>> Cefepime 3/6 - 3/6 Metronidazole 3/6 - 3/6   Subjective: Patient seen examined bedside, resting comfortably.  Daughter present.  Continues with some back pain.  Overnight, underwent in and out catheterization for urinary retention x2.  Discussed with patient and daughter that may need Foley catheter until mobility improves.  WBC count  improving.  No other specific complaints or concerns at this time.  Denies headache, no chills/night sweats, no chest pain, no palpitations, no shortness of breath, no abdominal pain, no focal weakness, no cough/congestion, no paresthesias.  No other acute events overnight per nursing staff.  Objective: Vitals:   01/27/22 1627 01/27/22 1905 01/27/22 2343 01/28/22 0753  BP: (!) 164/103 139/73 (!) 150/72 (!) 153/82  Pulse: (!) 119 (!) 103 91 100  Resp: '20 20 19 20  '$ Temp: (!) 100.6 F (38.1 C) 99.7 F (37.6 C) 99.3 F (37.4 C) 99.5 F (37.5 C)  TempSrc: Oral  Oral Oral  SpO2: 92% 94% 92% 96%  Weight:      Height:        Intake/Output Summary (Last 24 hours) at 01/28/2022 1124 Last data filed at 01/28/2022 0600 Gross per 24 hour  Intake 200 ml  Output 1100 ml  Net -900 ml   Filed Weights   01/26/22 1243  Weight: 67.1 kg    Examination:  Physical Exam: GEN: NAD, alert and oriented x 3, elderly in appearance HEENT: NCAT, PERRL, EOMI, sclera clear, MMM PULM: CTAB w/o wheezes/crackles, normal respiratory effort, on room air CV: Tachycardic, regular rhythm w/o M/G/R GI: abd soft, NTND, NABS, no R/G/M MSK: no peripheral edema, muscle strength globally intact 5/5 bilateral upper/lower extremities NEURO: CN II-XII intact, no focal deficits, sensation to light touch intact PSYCH: normal mood/affect Integumentary: dry/intact, no rashes or wounds    Data Reviewed: I have personally reviewed following labs and imaging studies  CBC: Recent Labs  Lab 01/25/22 1536 01/26/22 1406 01/27/22 0408 01/28/22 0651  WBC 15.8* 25.8* 25.7* 14.8*  NEUTROABS  --  23.0*  --   --   HGB 13.2 13.0 12.5 11.9*  HCT 40.9 40.5 39.0 35.7*  MCV 85.0 84.7 86.9 83.2  PLT 327 256 244 790   Basic Metabolic Panel: Recent Labs  Lab 01/25/22 1536 01/26/22 1406 01/27/22 0408 01/28/22 0651  NA 140  137 135 135  K 3.5 3.0* 3.9 3.4*  CL 104 100 101 101  CO2 25 25 17* 24  GLUCOSE 128* 116* 91 80  BUN  '14 19 19 13  '$ CREATININE 0.72 0.94 0.87 0.70  CALCIUM 9.5 9.0 8.9 8.8*   GFR: Estimated Creatinine Clearance: 48.1 mL/min (by C-G formula based on SCr of 0.7 mg/dL). Liver Function Tests: Recent Labs  Lab 01/25/22 1700 01/26/22 1406 01/27/22 0408  AST 32 20 24  ALT '24 21 19  '$ ALKPHOS 81 84 94  BILITOT 1.0 1.0 0.8  PROT 7.3 7.1 7.0  ALBUMIN 4.0 3.6 3.3*   Recent Labs  Lab 01/25/22 1700 01/26/22 1406  LIPASE 30 26   No results for input(s): AMMONIA in the last 168 hours. Coagulation Profile: Recent Labs  Lab 01/26/22 1406  INR 1.1   Cardiac Enzymes: No results for input(s): CKTOTAL, CKMB, CKMBINDEX, TROPONINI in the last 168 hours. BNP (last 3 results) No results for input(s): PROBNP in the last 8760 hours. HbA1C: Recent Labs    01/27/22 1810  HGBA1C 5.4   CBG: Recent Labs  Lab 01/25/22 1849 01/27/22 0919 01/27/22 1735 01/27/22 2126 01/28/22 0752  GLUCAP 140* 142* 97 106* 82   Lipid Profile: No results for input(s): CHOL, HDL, LDLCALC, TRIG, CHOLHDL, LDLDIRECT in the last 72 hours. Thyroid Function Tests: No results for input(s): TSH, T4TOTAL, FREET4, T3FREE, THYROIDAB in the last 72 hours. Anemia Panel: No results for input(s): VITAMINB12, FOLATE, FERRITIN, TIBC, IRON, RETICCTPCT in the last 72 hours. Sepsis Labs: Recent Labs  Lab 01/26/22 1406 01/27/22 0808 01/27/22 1810  LATICACIDVEN 2.7* 1.0 1.2    Recent Results (from the past 240 hour(s))  Resp Panel by RT-PCR (Flu A&B, Covid) Nasopharyngeal Swab     Status: None   Collection Time: 01/26/22  2:06 PM   Specimen: Nasopharyngeal Swab; Nasopharyngeal(NP) swabs in vial transport medium  Result Value Ref Range Status   SARS Coronavirus 2 by RT PCR NEGATIVE NEGATIVE Final    Comment: (NOTE) SARS-CoV-2 target nucleic acids are NOT DETECTED.  The SARS-CoV-2 RNA is generally detectable in upper respiratory specimens during the acute phase of infection. The lowest concentration of SARS-CoV-2 viral  copies this assay can detect is 138 copies/mL. A negative result does not preclude SARS-Cov-2 infection and should not be used as the sole basis for treatment or other patient management decisions. A negative result may occur with  improper specimen collection/handling, submission of specimen other than nasopharyngeal swab, presence of viral mutation(s) within the areas targeted by this assay, and inadequate number of viral copies(<138 copies/mL). A negative result must be combined with clinical observations, patient history, and epidemiological information. The expected result is Negative.  Fact Sheet for Patients:  EntrepreneurPulse.com.au  Fact Sheet for Healthcare Providers:  IncredibleEmployment.be  This test is no t yet approved or cleared by the [place] FDA and  has been authorized for detection and/or diagnosis of SARS-CoV-2 by FDA under an Emergency Use Authorization (EUA). This EUA will remain  in effect (meaning this test can be used) for the duration of the COVID-19 declaration under Section 564(b)(1) of the Act, 21 U.S.C.section 360bbb-3(b)(1), unless the authorization is terminated  or revoked sooner.       Influenza A by PCR NEGATIVE NEGATIVE Final   Influenza B by PCR NEGATIVE NEGATIVE Final    Comment: (NOTE) The Xpert Xpress SARS-CoV-2/FLU/RSV plus assay is intended as an aid in the diagnosis of influenza from Nasopharyngeal swab specimens and should not  be used as a sole basis for treatment. Nasal washings and aspirates are unacceptable for Xpert Xpress SARS-CoV-2/FLU/RSV testing.  Fact Sheet for Patients: EntrepreneurPulse.com.au  Fact Sheet for Healthcare Providers: IncredibleEmployment.be  This test is not yet approved or cleared by the [place] FDA and has been authorized for detection and/or diagnosis of SARS-CoV-2 by FDA under an Emergency Use Authorization (EUA). This  EUA will remain in effect (meaning this test can be used) for the duration of the COVID-19 declaration under Section 564(b)(1) of the Act, 21 U.S.C. section 360bbb-3(b)(1), unless the authorization is terminated or revoked.  Performed at [facility], [place] [address], [place], [place] [address]   Blood Culture (routine x 2)     Status: None (Preliminary result)   Collection Time: 01/26/22  9:51 PM   Specimen: BLOOD RIGHT HAND  Result Value Ref Range Status   Specimen Description BLOOD RIGHT HAND  Final   Special Requests   Final    BOTTLES DRAWN AEROBIC AND ANAEROBIC Blood Culture adequate volume   Culture   Final    NO GROWTH 2 DAYS Performed at [facility], [place] [address], [place], [place] [address]    Report Status PENDING  Incomplete  Urine Culture     Status: None   Collection Time: 01/27/22  1:47 AM   Specimen: Urine, Random  Result Value Ref Range Status   Specimen Description URINE, RANDOM  Final   Special Requests NONE  Final   Culture   Final    NO GROWTH Performed at [facility], [address] [place], [place] [address]    Report Status 01/28/2022 FINAL  Final  Aerobic/Anaerobic Culture w Gram Stain (surgical/deep wound)     Status: None (Preliminary result)   Collection Time: 01/27/22  2:03 PM   Specimen: Wound; Tissue  Result Value Ref Range Status   Specimen Description WOUND  Final   Special Requests INTERVERTEBRAL DISC T8/T9  Final   Gram Stain NO WBC SEEN NO ORGANISMS SEEN   Final   Culture   Final    TOO YOUNG TO READ Performed at [facility], [address], [place], [place] [address]    Report Status PENDING  Incomplete  Culture, fungus without smear     Status: None (Preliminary result)   Collection Time: 01/27/22  2:03 PM   Specimen: Wound; Other  Result Value Ref Range Status   Specimen Description WOUND  Final   Special Requests INTERVERTEBRAL DISC T8/T9  Final   Culture   Final    NO FUNGUS ISOLATED AFTER 1  DAY Performed at [facility], [address], [place], [place] [address]    Report Status PENDING  Incomplete         Radiology Studies: MR THORACIC SPINE W WO CONTRAST  Result Date: 01/26/2022 CLINICAL DATA:  Osteomyelitis, thoracic; Lumbar radiculopathy, infection suspected, positive xray/CT EXAM: MRI THORACIC AND LUMBAR SPINE WITHOUT AND WITH CONTRAST TECHNIQUE: Multiplanar and multiecho pulse sequences of the thoracic and lumbar spine were obtained without and with intravenous contrast. CONTRAST:  15m GADAVIST GADOBUTROL 1 MMOL/ML IV SOLN COMPARISON:  CT of the chest January 25, 2022. FINDINGS: MRI THORACIC SPINE FINDINGS Alignment: Mildly exaggerated thoracic kyphosis. No substantial sagittal subluxation. Vertebrae: Mild T8-T9 and to lesser extent T9-T10 T2 hyperintense signal within the disc spaces. Adjacent marrow signal change involving the T9 and to a lesser extent inferior T8 and superior T10 vertebral bodies, which is T1  and T2 hypointense and sclerotic on prior CT. Cord: Normal cord signal. Paraspinal and other soft tissues: Linear opacities within bilateral dependent lungs. Disc levels: Fairly extensive abnormal enhancement in the dorsal and ventral canal, compatible with epidural phlegmon. Dorsal enhancement extends from the cervicothoracic junction to thoracolumbar junction. Ventral enhancement extends from approximately T6-T7 to T10-T11. Enhancement measures up to 3 mm in thickness dorsally and 2 mm ventrally. No discrete nonenhancing component to suggest abscess. This is superimposed on multilevel degenerative change, including: Posterior disc bulges at T1-T2, T6-T7, and T12-L1 partially efface ventral CSF with mild canal stenosis at these levels. Posterior disc bulges and ligamentum flavum thickening at T7-T8, T8-T9, T9-T10 and T10-T11 with resulting moderate canal stenosis at these levels. Multilevel facet arthropathy with moderate to severe foraminal stenosis bilaterally at T8-T9,  T9-T10, and on the right at T10-T11, T11-T12 and T12-L1. MRI LUMBAR SPINE FINDINGS Segmentation: Transitional lumbosacral anatomy. Four non rib-bearing lumbar vertebral bodies. Sacralization of L5. Alignment:  Grade 1 anterolisthesis of L3 on L4. Vertebrae: Endplate signal changes about the L3-L4 disc, favored degenerative. No appreciable disc edema. Otherwise, no focal marrow edema in the lumbar spine. Conus medullaris: Extends to the inferior L1 level and appears normal. Paraspinal and other soft tissues: Right renal cysts. Disc levels: T12-L1: Left greater than right facet arthropathy. Resulting moderate to severe left and mild right foraminal stenosis. Patent canal. L1-L2: Broad disc bulge with severe right and moderate left facet arthropathy. Resulting severe bilateral foraminal stenosis. Mild canal stenosis. L2-L3: Severe right facet arthropathy. Prior posterior decompression. Severe right and mild left foraminal stenosis. Patent canal. L3-L4: Severe disc height loss with endplate signal changes. Grade 1 anterolisthesis. Broad disc bulge with endplate spurring. Resulting severe bilateral foraminal stenosis. Prior posterior decompression. Transverse narrowing of the canal with resulting mild to moderate canal stenosis. Narrowing of the right greater than left subarticular recesses. L4-L5: Posterior disc bulge. Left greater than right facet arthropathy. Resulting severe left and moderate right foraminal stenosis. Posterior decompression with patent canal. L5-S1: Transitional anatomy.  Patent canal and foramina. 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 1. Extensive epidural enhancement throughout the thoracic canal, compatible with epidural phlegmon that is detailed above and measures up to 3 mm in thickness dorsally and 2 mm ventrally. 2. Marked sclerotic change of the T9 and surrounding T8 and T10 endplates with mild edema in the T8-T9 and T9-T10  disc spaces. Findings are suspicious for chronic discitis/osteomyelitis given the above findings. 3. When superimposed on degenerative change, there is multilevel moderate canal stenosis in the lower thoracic spine. Also, mild to moderate canal stenosis at L3-L4 with degenerative disease and prior posterior decompression at this level. 4. Multilevel moderate to severe foraminal stenosis in the thoracic spine and multilevel severe foraminal stenosis in the lumbar spine, as detailed above. Electronically Signed   By: Margaretha Sheffield M.D.   On: 01/26/2022 17:43   MR Lumbar Spine W Wo Contrast  Result Date: 01/26/2022 CLINICAL DATA:  Osteomyelitis, thoracic; Lumbar radiculopathy, infection suspected, positive xray/CT EXAM: MRI THORACIC AND LUMBAR SPINE WITHOUT AND WITH CONTRAST TECHNIQUE: Multiplanar and multiecho pulse sequences of the thoracic and lumbar spine were obtained without and with intravenous contrast. CONTRAST:  66m GADAVIST GADOBUTROL 1 MMOL/ML IV SOLN COMPARISON:  CT of the chest January 25, 2022. FINDINGS: MRI THORACIC SPINE FINDINGS Alignment: Mildly exaggerated thoracic kyphosis. No substantial sagittal subluxation. Vertebrae: Mild T8-T9 and to lesser extent T9-T10 T2 hyperintense signal within the disc spaces.  Adjacent marrow signal change involving the T9 and to a lesser extent inferior T8 and superior T10 vertebral bodies, which is T1 and T2 hypointense and sclerotic on prior CT. Cord: Normal cord signal. Paraspinal and other soft tissues: Linear opacities within bilateral dependent lungs. Disc levels: Fairly extensive abnormal enhancement in the dorsal and ventral canal, compatible with epidural phlegmon. Dorsal enhancement extends from the cervicothoracic junction to thoracolumbar junction. Ventral enhancement extends from approximately T6-T7 to T10-T11. Enhancement measures up to 3 mm in thickness dorsally and 2 mm ventrally. No discrete nonenhancing component to suggest abscess. This is  superimposed on multilevel degenerative change, including: Posterior disc bulges at T1-T2, T6-T7, and T12-L1 partially efface ventral CSF with mild canal stenosis at these levels. Posterior disc bulges and ligamentum flavum thickening at T7-T8, T8-T9, T9-T10 and T10-T11 with resulting moderate canal stenosis at these levels. Multilevel facet arthropathy with moderate to severe foraminal stenosis bilaterally at T8-T9, T9-T10, and on the right at T10-T11, T11-T12 and T12-L1. MRI LUMBAR SPINE FINDINGS Segmentation: Transitional lumbosacral anatomy. Four non rib-bearing lumbar vertebral bodies. Sacralization of L5. Alignment:  Grade 1 anterolisthesis of L3 on L4. Vertebrae: Endplate signal changes about the L3-L4 disc, favored degenerative. No appreciable disc edema. Otherwise, no focal marrow edema in the lumbar spine. Conus medullaris: Extends to the inferior L1 level and appears normal. Paraspinal and other soft tissues: Right renal cysts. Disc levels: T12-L1: Left greater than right facet arthropathy. Resulting moderate to severe left and mild right foraminal stenosis. Patent canal. L1-L2: Broad disc bulge with severe right and moderate left facet arthropathy. Resulting severe bilateral foraminal stenosis. Mild canal stenosis. L2-L3: Severe right facet arthropathy. Prior posterior decompression. Severe right and mild left foraminal stenosis. Patent canal. L3-L4: Severe disc height loss with endplate signal changes. Grade 1 anterolisthesis. Broad disc bulge with endplate spurring. Resulting severe bilateral foraminal stenosis. Prior posterior decompression. Transverse narrowing of the canal with resulting mild to moderate canal stenosis. Narrowing of the right greater than left subarticular recesses. L4-L5: Posterior disc bulge. Left greater than right facet arthropathy. Resulting severe left and moderate right foraminal stenosis. Posterior decompression with patent canal. L5-S1: Transitional anatomy.  Patent canal  and foramina. 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 1. Extensive epidural enhancement throughout the thoracic canal, compatible with epidural phlegmon that is detailed above and measures up to 3 mm in thickness dorsally and 2 mm ventrally. 2. Marked sclerotic change of the T9 and surrounding T8 and T10 endplates with mild edema in the T8-T9 and T9-T10 disc spaces. Findings are suspicious for chronic discitis/osteomyelitis given the above findings. 3. When superimposed on degenerative change, there is multilevel moderate canal stenosis in the lower thoracic spine. Also, mild to moderate canal stenosis at L3-L4 with degenerative disease and prior posterior decompression at this level. 4. Multilevel moderate to severe foraminal stenosis in the thoracic spine and multilevel severe foraminal stenosis in the lumbar spine, as detailed above. Electronically Signed   By: Margaretha Sheffield M.D.   On: 01/26/2022 17:43   IR Fluoro Guide Ndl Plmt / BX  Result Date: 01/27/2022 INDICATION: [name] is a 76 y.o. female with a past medical history significant for RA, DM, HTN who presented to the ED on 01/26/22 with worsening upper back pain that radiates to her chest X 3 day. Found to be tachycardic and febrile with leukocystosis and elevated lactic acid. MR Lumbar and thoracic spine from 01/26/22 showed marked sclerotic change of the  T9 and surrounding T8 and T10 endplates with mild edema in the T8-T9 and T9-T10 disc spaces, concerning for chronic discitis/osteomyelitis. She comes to our service today for a T9 vertebral body core bone biopsy with T8-9 disc aspiration. EXAM: Fluoroscopy guided T9 core bone biopsy and T8-9 fine-needle aspiration MEDICATIONS: Ancef 1 g IV. ANESTHESIA/SEDATION: Moderate (conscious) sedation was employed during this procedure. A total of Versed 2 mg and Fentanyl 75 mcg was administered intravenously by the radiology nurse. Total  intra-service moderate Sedation Time: 45 minutes. The patient's level of consciousness and vital signs were monitored continuously by radiology nursing throughout the procedure under my direct supervision. FLUOROSCOPY: Radiation Exposure Index (as provided by the fluoroscopic device): 8101 mGy Kerma COMPLICATIONS: None immediate. PROCEDURE: Informed written consent was obtained from the patient after a thorough discussion of the procedural risks, benefits and alternatives. All questions were addressed. Maximal Sterile Barrier Technique was utilized including caps, mask, sterile gowns, sterile gloves, sterile drape, hand hygiene and skin antiseptic. A timeout was performed prior to the initiation of the procedure. The patient was placed in prone position on the angiography table. The thoracic spine region was prepped and draped in a sterile fashion. Under fluoroscopy, the T9 vertebral body was delineated and the skin area was marked. The skin was infiltrated with a 1% Lidocaine approximately 4 cm lateral to the spinous process projection on the left. Using a 22-gauge spinal needle, the soft issue and the peripedicular space and periosteum were infiltrated with Bupivacaine 0.5%. A skin incision was made at the access site. Subsequently, an 11-gauge Kyphon trocar was inserted under fluoroscopic guidance until contact with the pedicle was obtained. The trocar was inserted under light hammer tapping into the pedicle until the posterior boundaries of the vertebral body was reached. The diamond mandrill was removed and one core biopsy wasobtained. Attempt to advance a 20 gauge in a 22 gauge spinal needle through the endplate into the disc space proved unsuccessful. The cannula was later removed. Then, under fluoroscopy guidance and using extra pedicular approach a 22 gauge x 20 cm spinal needle was advanced into the T8-9 intervertebral disc. Two fine-needle aspiration samples were obtained with small amount of blood tinged  fluid obtained. The access sites were cleaned and covered with a sterile bandage. IMPRESSION: Successful fluoroscopy guided T9 core bone biopsy and T8-9 disc fine-needle aspiration. Core biopsy sample was sent for tissue exam and fine needle aspiration samples were sent for Gram stain and culture. Electronically Signed   By: Pedro Earls M.D.   On: 01/27/2022 15:18   IR Fluoro Guide Ndl Plmt / BX  Result Date: 01/27/2022 INDICATION: [name] is a 76 y.o. female with a past medical history significant for RA, DM, HTN who presented to the ED on 01/26/22 with worsening upper back pain that radiates to her chest X 3 day. Found to be tachycardic and febrile with leukocystosis and elevated lactic acid. 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 She comes to our service today for a T9 vertebral body core bone biopsy with T8-9 disc aspiration. EXAM: Fluoroscopy guided T9 core bone biopsy and T8-9 fine-needle aspiration MEDICATIONS: Ancef 1 g IV. ANESTHESIA/SEDATION: Moderate (conscious) sedation was employed during this procedure. A total of Versed 2 mg and Fentanyl 75 mcg was administered intravenously by the radiology nurse. Total intra-service moderate  Sedation Time: 45 minutes. The patient's level of consciousness and vital signs were monitored continuously by radiology nursing throughout the procedure under my direct supervision. FLUOROSCOPY: Radiation Exposure Index (as provided by the fluoroscopic device): 5638 mGy Kerma COMPLICATIONS: None immediate. PROCEDURE: Informed written consent was obtained from the patient after a thorough discussion of the procedural risks, benefits and alternatives. All questions were addressed. Maximal Sterile Barrier Technique was utilized including caps, mask, sterile gowns, sterile  gloves, sterile drape, hand hygiene and skin antiseptic. A timeout was performed prior to the initiation of the procedure. The patient was placed in prone position on the angiography table. The thoracic spine region was prepped and draped in a sterile fashion. Under fluoroscopy, the T9 vertebral body was delineated and the skin area was marked. The skin was infiltrated with a 1% Lidocaine approximately 4 cm lateral to the spinous process projection on the left. Using a 22-gauge spinal needle, the soft issue and the peripedicular space and periosteum were infiltrated with Bupivacaine 0.5%. A skin incision was made at the access site. Subsequently, an 11-gauge Kyphon trocar was inserted under fluoroscopic guidance until contact with the pedicle was obtained. The trocar was inserted under light hammer tapping into the pedicle until the posterior boundaries of the vertebral body was reached. The diamond mandrill was removed and one core biopsy wasobtained. Attempt to advance a 20 gauge in a 22 gauge spinal needle through the endplate into the disc space proved unsuccessful. The cannula was later removed. Then, under fluoroscopy guidance and using extra pedicular approach a 22 gauge x 20 cm spinal needle was advanced into the T8-9 intervertebral disc. Two fine-needle aspiration samples were obtained with small amount of blood tinged fluid obtained. The access sites were cleaned and covered with a sterile bandage. IMPRESSION: Successful fluoroscopy guided T9 core bone biopsy and T8-9 disc fine-needle aspiration. Core biopsy sample was sent for tissue exam and fine needle aspiration samples were sent for Gram stain and culture. Electronically Signed   By: Pedro Earls M.D.   On: 01/27/2022 15:18   DG Chest Port 1 View  Result Date: 01/26/2022 CLINICAL DATA:  Will sepsis, central chest pain, back pain, shortness of breath EXAM: PORTABLE CHEST 1 VIEW COMPARISON:  Portable exam 1347 hours compared to  01/25/2022 FINDINGS: Upper normal size of cardiac silhouette. Atherosclerotic calcification and tortuosity of thoracic aorta. Lungs clear. No acute infiltrate, pleural effusion, or pneumothorax. Bones demineralized with evidence of prior cervicothoracic fusion and LEFT glenohumeral degenerative changes. IMPRESSION: No acute abnormalities. Aortic Atherosclerosis (ICD10-I70.0). Electronically Signed   By: Lavonia Dana M.D.   On: 01/26/2022 13:56   ECHOCARDIOGRAM COMPLETE  Result Date: 01/27/2022    ECHOCARDIOGRAM REPORT   Patient Name:   [name] Date of Exam: 01/27/2022 Medical Rec #:  [id number]      Height:       56.0 in Accession #:    [id number]     Weight:       148.0 lb Date of Birth:  [birth year]      BSA:          1.562 m Patient Age:    70 years       BP:           140/85 mmHg Patient Gender: F              HR:           127 bpm. Exam Location:  Inpatient Procedure: 2D Echo Indications:  Fever  History:        Patient has no prior history of Echocardiogram examinations.                 Signs/Symptoms:Shortness of Breath; Risk Factors:Dyslipidemia,                 Hypertension and Diabetes.  Sonographer:    Arlyss Gandy Referring Phys: [id number] [facility]  Sonographer Comments: Technically difficult study due to poor echo windows. IMPRESSIONS  1. Left ventricular ejection fraction, by estimation, is 60 to 65%. Left ventricular ejection fraction by PLAX is 63 %. The left ventricle has normal function. The left ventricle has no regional wall motion abnormalities. There is moderate asymmetric left ventricular hypertrophy of the basal-septal segment. Left ventricular diastolic parameters are consistent with Grade I diastolic dysfunction (impaired relaxation). Elevated left ventricular end-diastolic pressure. The E/e' is 28.  2. Right ventricular systolic function is normal. The right ventricular size is normal. There is normal pulmonary artery systolic pressure. The estimated right ventricular  systolic pressure is 95.1 mmHg.  3. The mitral valve is grossly normal. Trivial mitral valve regurgitation.  4. The aortic valve is tricuspid. Aortic valve regurgitation is not visualized.  5. The inferior vena cava is normal in size with greater than 50% respiratory variability, suggesting right atrial pressure of 3 mmHg. Comparison(s): No prior Echocardiogram. FINDINGS  Left Ventricle: Left ventricular ejection fraction, by estimation, is 60 to 65%. Left ventricular ejection fraction by PLAX is 63 %. The left ventricle has normal function. The left ventricle has no regional wall motion abnormalities. The left ventricular internal cavity size was normal in size. There is moderate asymmetric left ventricular hypertrophy of the basal-septal segment. Left ventricular diastolic parameters are consistent with Grade I diastolic dysfunction (impaired relaxation). Elevated left ventricular end-diastolic pressure. The E/e' is 16. Right Ventricle: The right ventricular size is normal. No increase in right ventricular wall thickness. Right ventricular systolic function is normal. There is normal pulmonary artery systolic pressure. The tricuspid regurgitant velocity is 2.49 m/s, and  with an assumed right atrial pressure of 3 mmHg, the estimated right ventricular systolic pressure is 88.4 mmHg. Left Atrium: Left atrial size was normal in size. Right Atrium: Right atrial size was normal in size. Pericardium: There is no evidence of pericardial effusion. Mitral Valve: The mitral valve is grossly normal. Trivial mitral valve regurgitation. MV peak gradient, 9.9 mmHg. The mean mitral valve gradient is 4.0 mmHg. Tricuspid Valve: The tricuspid valve is grossly normal. Tricuspid valve regurgitation is trivial. Aortic Valve: The aortic valve is tricuspid. Aortic valve regurgitation is not visualized. Aortic valve mean gradient measures 5.0 mmHg. Aortic valve peak gradient measures 10.9 mmHg. Aortic valve area, by VTI measures 2.59  cm. Pulmonic Valve: The pulmonic valve was normal in structure. Pulmonic valve regurgitation is not visualized. Aorta: The aortic root and ascending aorta are structurally normal, with no evidence of dilitation. Venous: The inferior vena cava is normal in size with greater than 50% respiratory variability, suggesting right atrial pressure of 3 mmHg. IAS/Shunts: No atrial level shunt detected by color flow Doppler.  LEFT VENTRICLE PLAX 2D LV EF:         Left            Diastology                ventricular     LV e' medial:    4.90 cm/s  ejection        LV E/e' medial:  15.2                fraction by     LV e' lateral:   4.35 cm/s                PLAX is 63      LV E/e' lateral: 17.2                %. LVIDd:         3.30 cm LVIDs:         2.20 cm LV PW:         1.10 cm LV IVS:        1.40 cm LVOT diam:     1.90 cm LV SV:         60 LV SV Index:   38 LVOT Area:     2.84 cm  RIGHT VENTRICLE             IVC RV Basal diam:  3.20 cm     IVC diam: 1.80 cm RV Mid diam:    3.20 cm RV S prime:     16.60 cm/s TAPSE (M-mode): 1.8 cm LEFT ATRIUM             Index        RIGHT ATRIUM           Index LA diam:        2.90 cm 1.86 cm/m   RA Area:     11.30 cm LA Vol (A2C):   44.3 ml 28.36 ml/m  RA Volume:   21.20 ml  13.57 ml/m LA Vol (A4C):   39.4 ml 25.23 ml/m LA Biplane Vol: 43.1 ml 27.59 ml/m  AORTIC VALVE AV Area (Vmax):    2.35 cm AV Area (Vmean):   2.33 cm AV Area (VTI):     2.59 cm AV Vmax:           165.00 cm/s AV Vmean:          106.000 cm/s AV VTI:            0.232 m AV Peak Grad:      10.9 mmHg AV Mean Grad:      5.0 mmHg LVOT Vmax:         137.00 cm/s LVOT Vmean:        87.100 cm/s LVOT VTI:          0.212 m LVOT/AV VTI ratio: 0.91  AORTA Ao Root diam: 2.70 cm Ao Asc diam:  3.10 cm MITRAL VALVE                TRICUSPID VALVE MV Area (PHT): 3.30 cm     TR Peak grad:   24.8 mmHg MV Area VTI:   2.62 cm     TR Vmax:        249.00 cm/s MV Peak grad:  9.9 mmHg MV Mean grad:  4.0 mmHg     SHUNTS MV  Vmax:       1.57 m/s     Systemic VTI:  0.21 m MV Vmean:      94.0 cm/s    Systemic Diam: 1.90 cm MV Decel Time: 230 msec MV E velocity: 74.70 cm/s MV A velocity: 130.00 cm/s MV E/A ratio:  0.57 Lyman Bishop MD Electronically signed by Lyman Bishop MD Signature Date/Time: 01/27/2022/5:24:42 PM    Final  Scheduled Meds:  diltiazem  180 mg Oral Daily   enalapril  20 mg Oral BID   fluticasone furoate-vilanterol  1 puff Inhalation Daily   And   umeclidinium bromide  1 puff Inhalation Daily   folic acid  0.5 mg Oral Daily   gabapentin  300 mg Oral QHS   insulin aspart  0-9 Units Subcutaneous TID WC   potassium chloride  40 mEq Oral Once   pravastatin  40 mg Oral q1800   Continuous Infusions:  cefTRIAXone (ROCEPHIN)  IV 2 g (01/27/22 1728)   vancomycin 500 mg (01/27/22 2145)     LOS: 2 days    Time spent: 58 minutes spent on chart review, discussion with nursing staff, consultants, updating family and interview/physical exam; more than 50% of that time was spent in counseling and/or coordination of care.    Kelleen Stolze J [place] DO [facility] Available via Epic secure chat 7am-7pm After these hours, please refer to coverage provider listed on [email or web address] 01/28/2022, 11:24 AM

## 2022-01-28 NOTE — Progress Notes (Addendum)
I have seen and examined the patient. I have personally reviewed the clinical findings, laboratory findings, microbiological data and imaging studies. The assessment and treatment plan was discussed with the Nurse Practitioner [name]. I agree with [name] recommendations except following additions/corrections.  Fever curve trending down Back pain significantly improved  WBC down to 14 Blood cultures no growth so far  TTE unremarkable  No changes on exam from yesterday  Continue Vancomycin, pharmacy to dose and ceftriaxone Fu IR aspirate cultures and path for de-escalation  Monitor CBC, BMP, Vancomycin trough  Will need 6-8 weeks of IV abtx Following   Anne Oz, [name] Infectious Disease Physician [facility] [address] [name], [name] [address] Phone: [phone]   Fax: [place]  Date of Admission:  01/26/2022     Total days of antibiotics 3         ASSESSMENT:  [name] is 1 day s/p IR aspiration and appears to be improved and fever curve is improving. Blood cultures are without growth and aspiration gram stain with no organisms seen and cultures pending. Remains without indications for surgical intervention. Continue broad spectrum coverage with vancomycin and cefepime with narrowing antibiotics as able per culture results as they become available. Pain appears adequately managed at present. Anticipating 6-8 weeks of IV antibiotics. Awaiting PT/OT evaluation for mobility and potential disposition recommendations. Remaining medical and supportive care per primary team. Plan of care discussed with [name] and her [name].   PLAN:  Continue vancomycin and ceftriaxone  Monitor blood and aspiration cultures for organisms Remaining medical and supportive care per primary team.   Principal Problem:   Discitis Active Problems:   Epidural abscess   HTN  (hypertension)   Diabetes (HCC)   Centrilobular emphysema (HCC)   Sepsis ([place]) discitis/osteomyelitis, epidural phlegmon   Rheumatoid arthritis (HCC)   Hyperlipidemia    diltiazem  180 mg Oral Daily   enalapril  20 mg Oral BID   fluticasone furoate-vilanterol  1 puff Inhalation Daily   And   umeclidinium bromide  1 puff Inhalation Daily   folic acid  0.5 mg Oral Daily   gabapentin  300 mg Oral QHS   insulin aspart  0-9 Units Subcutaneous TID WC   pravastatin  40 mg Oral q1800    SUBJECTIVE:  Fever curve improving overnight and WBC count down to 14.8. Feeling better this morning and able to eat some food and sit up a little. [name] at bedside.   Allergies  Allergen Reactions   Ivp Dye [Iodinated Contrast Media] Nausea And Vomiting   Prednisone Other (See Comments)    reflux Other reaction(s): acid reflux Other reaction(s): acid reflux Other reaction(s): acid reflux     Review of Systems: Review of Systems  Constitutional:  Negative for chills, fever and weight loss.  Respiratory:  Negative for cough, shortness of breath and wheezing.   Cardiovascular:  Negative for chest pain and leg swelling.  Gastrointestinal:  Negative for abdominal pain, constipation, diarrhea, nausea and vomiting.  Musculoskeletal:  Positive for back pain.  Skin:  Negative for rash.     OBJECTIVE: Vitals:   01/27/22 1627 01/27/22 1905 01/27/22 2343 01/28/22 0753  BP: (!) 164/103 139/73 (!) 150/72 (!) 153/82  Pulse: (!) 119 (!) 103 91 100  Resp: '20 20 19 20  '$ Temp: (!) 100.6 F (38.1 C) 99.7 F (37.6 C) 99.3 F (37.4 C) 99.5 F (  37.5 C)  TempSrc: Oral  Oral Oral  SpO2: 92% 94% 92% 96%  Weight:      Height:       Body mass index is 33.18 kg/m.  Physical Exam Constitutional:      General: She is not in acute distress.    Appearance: She is well-developed.     Comments: Lying in bed with head of bed elevated; pleasant.   Cardiovascular:     Rate and Rhythm: Normal rate and  regular rhythm.     Heart sounds: Normal heart sounds.  Pulmonary:     Effort: Pulmonary effort is normal.     Breath sounds: Normal breath sounds.  Skin:    General: Skin is warm and dry.  Neurological:     Mental Status: She is alert and oriented to person, place, and time.  Psychiatric:        Behavior: Behavior normal.        Thought Content: Thought content normal.        Judgment: Judgment normal.    Lab Results Lab Results  Component Value Date   WBC 14.8 (H) 01/28/2022   HGB 11.9 (L) 01/28/2022   HCT 35.7 (L) 01/28/2022   MCV 83.2 01/28/2022   PLT 206 01/28/2022    Lab Results  Component Value Date   CREATININE 0.70 01/28/2022   BUN 13 01/28/2022   NA 135 01/28/2022   K 3.4 (L) 01/28/2022   CL 101 01/28/2022   CO2 24 01/28/2022    Lab Results  Component Value Date   ALT 19 01/27/2022   AST 24 01/27/2022   ALKPHOS 94 01/27/2022   BILITOT 0.8 01/27/2022     Microbiology: Recent Results (from the past 240 hour(s))  Resp Panel by RT-PCR (Flu A&B, Covid) Nasopharyngeal Swab     Status: None   Collection Time: 01/26/22  2:06 PM   Specimen: Nasopharyngeal Swab; Nasopharyngeal([name]) swabs in vial transport medium  Result Value Ref Range Status   SARS Coronavirus 2 by RT PCR NEGATIVE NEGATIVE Final    Comment: (NOTE) SARS-CoV-2 target nucleic acids are NOT DETECTED.  The SARS-CoV-2 RNA is generally detectable in upper respiratory specimens during the acute phase of infection. The lowest concentration of SARS-CoV-2 viral copies this assay can detect is 138 copies/mL. A negative result does not preclude SARS-Cov-2 infection and should not be used as the sole basis for treatment or other patient management decisions. A negative result may occur with  improper specimen collection/handling, submission of specimen other than nasopharyngeal swab, presence of viral mutation(s) within the areas targeted by this assay, and inadequate number of viral copies(<138  copies/mL). A negative result must be combined with clinical observations, patient history, and epidemiological information. The expected result is Negative.  Fact Sheet for Patients:  EntrepreneurPulse.com.au  Fact Sheet for Healthcare Providers:  IncredibleEmployment.be  This test is no t yet approved or cleared by the [place] FDA and  has been authorized for detection and/or diagnosis of SARS-CoV-2 by FDA under an Emergency Use Authorization (EUA). This EUA will remain  in effect (meaning this test can be used) for the duration of the COVID-19 declaration under Section 564(b)(1) of the Act, 21 U.S.C.section 360bbb-3(b)(1), unless the authorization is terminated  or revoked sooner.       Influenza A by PCR NEGATIVE NEGATIVE Final   Influenza B by PCR NEGATIVE NEGATIVE Final    Comment: (NOTE) The Xpert Xpress SARS-CoV-2/FLU/RSV plus assay is intended as an aid in the  diagnosis of influenza from Nasopharyngeal swab specimens and should not be used as a sole basis for treatment. Nasal washings and aspirates are unacceptable for Xpert Xpress SARS-CoV-2/FLU/RSV testing.  Fact Sheet for Patients: EntrepreneurPulse.com.au  Fact Sheet for Healthcare Providers: IncredibleEmployment.be  This test is not yet approved or cleared by the [place] FDA and has been authorized for detection and/or diagnosis of SARS-CoV-2 by FDA under an Emergency Use Authorization (EUA). This EUA will remain in effect (meaning this test can be used) for the duration of the COVID-19 declaration under Section 564(b)(1) of the Act, 21 U.S.C. section 360bbb-3(b)(1), unless the authorization is terminated or revoked.  Performed at [facility], [place] [address], [place], [place] [address]   Blood Culture (routine x 2)     Status: None (Preliminary result)   Collection Time: 01/26/22  9:51 PM   Specimen: BLOOD RIGHT HAND   Result Value Ref Range Status   Specimen Description BLOOD RIGHT HAND  Final   Special Requests   Final    BOTTLES DRAWN AEROBIC AND ANAEROBIC Blood Culture adequate volume   Culture   Final    NO GROWTH 2 DAYS Performed at [facility], [place] [address] [place], [place] [address]    Report Status PENDING  Incomplete  Urine Culture     Status: None   Collection Time: 01/27/22  1:47 AM   Specimen: Urine, Random  Result Value Ref Range Status   Specimen Description URINE, RANDOM  Final   Special Requests NONE  Final   Culture   Final    NO GROWTH Performed at [facility], [address] [place], [place] [address]    Report Status 01/28/2022 FINAL  Final  Aerobic/Anaerobic Culture w Gram Stain (surgical/deep wound)     Status: None (Preliminary result)   Collection Time: 01/27/22  2:03 PM   Specimen: Wound; Tissue  Result Value Ref Range Status   Specimen Description WOUND  Final   Special Requests INTERVERTEBRAL DISC T8/T9  Final   Gram Stain NO WBC SEEN NO ORGANISMS SEEN   Final   Culture   Final    TOO YOUNG TO READ Performed at [facility], [address] [place], [place] [address]    Report Status PENDING  Incomplete   Imagings IR Fluoro Guide Ndl Plmt / BX  Result Date: 01/27/2022 INDICATION: [name] is a 76 y.o. female with a past medical history significant for RA, DM, HTN who presented to the ED on 01/26/22 with worsening upper back pain that radiates to her chest X 3 day. Found to be tachycardic and febrile with leukocystosis and elevated lactic acid. 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 She comes to our service today for a T9 vertebral body core bone biopsy with T8-9 disc aspiration. EXAM: Fluoroscopy guided T9 core bone biopsy and T8-9 fine-needle aspiration  MEDICATIONS: Ancef 1 g IV. ANESTHESIA/SEDATION: Moderate (conscious) sedation was employed during this procedure. A total of Versed 2 mg and Fentanyl 75 mcg was administered intravenously by the radiology nurse. Total intra-service moderate Sedation Time: 45 minutes. The patient's level of consciousness and vital signs were monitored continuously by radiology nursing throughout the procedure under my direct supervision. FLUOROSCOPY: Radiation Exposure Index (as provided by the fluoroscopic device): 3212 mGy Kerma COMPLICATIONS: None immediate.  PROCEDURE: Informed written consent was obtained from the patient after a thorough discussion of the procedural risks, benefits and alternatives. All questions were addressed. Maximal Sterile Barrier Technique was utilized including caps, mask, sterile gowns, sterile gloves, sterile drape, hand hygiene and skin antiseptic. A timeout was performed prior to the initiation of the procedure. The patient was placed in prone position on the angiography table. The thoracic spine region was prepped and draped in a sterile fashion. Under fluoroscopy, the T9 vertebral body was delineated and the skin area was marked. The skin was infiltrated with a 1% Lidocaine approximately 4 cm lateral to the spinous process projection on the left. Using a 22-gauge spinal needle, the soft issue and the peripedicular space and periosteum were infiltrated with Bupivacaine 0.5%. A skin incision was made at the access site. Subsequently, an 11-gauge Kyphon trocar was inserted under fluoroscopic guidance until contact with the pedicle was obtained. The trocar was inserted under light hammer tapping into the pedicle until the posterior boundaries of the vertebral body was reached. The diamond mandrill was removed and one core biopsy wasobtained. Attempt to advance a 20 gauge in a 22 gauge spinal needle through the endplate into the disc space proved unsuccessful. The cannula was later removed. Then, under  fluoroscopy guidance and using extra pedicular approach a 22 gauge x 20 cm spinal needle was advanced into the T8-9 intervertebral disc. Two fine-needle aspiration samples were obtained with small amount of blood tinged fluid obtained. The access sites were cleaned and covered with a sterile bandage. IMPRESSION: Successful fluoroscopy guided T9 core bone biopsy and T8-9 disc fine-needle aspiration. Core biopsy sample was sent for tissue exam and fine needle aspiration samples were sent for Gram stain and culture. Electronically Signed   By: Anne Earls M.D.   On: 01/27/2022 15:18   IR Fluoro Guide Ndl Plmt / BX  Result Date: 01/27/2022 INDICATION: [name] is a 76 y.o. female with a past medical history significant for RA, DM, HTN who presented to the ED on 01/26/22 with worsening upper back pain that radiates to her chest X 3 day. Found to be tachycardic and febrile with leukocystosis and elevated lactic acid. 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 She comes to our service today for a T9 vertebral body core bone biopsy with T8-9 disc aspiration. EXAM: Fluoroscopy guided T9 core bone biopsy and T8-9 fine-needle aspiration MEDICATIONS: Ancef 1 g IV. ANESTHESIA/SEDATION: Moderate (conscious) sedation was employed during this procedure. A total of Versed 2 mg and Fentanyl 75 mcg was administered intravenously by the radiology nurse. Total intra-service moderate Sedation Time: 45 minutes. The patient's level of consciousness and vital signs were monitored continuously by radiology nursing throughout the procedure under my direct supervision. FLUOROSCOPY: Radiation Exposure Index (as provided by the fluoroscopic device): 0932 mGy Kerma COMPLICATIONS: None immediate. PROCEDURE: Informed written consent was obtained from  the patient after a thorough discussion of the procedural risks, benefits and alternatives. All questions were addressed. Maximal Sterile Barrier Technique was utilized including caps, mask, sterile gowns, sterile gloves, sterile drape, hand hygiene and skin antiseptic. A timeout was performed prior to the initiation of the procedure. The patient was placed in prone position on the angiography table. The thoracic spine region was prepped and draped in a sterile fashion. Under fluoroscopy, the T9 vertebral body  was delineated and the skin area was marked. The skin was infiltrated with a 1% Lidocaine approximately 4 cm lateral to the spinous process projection on the left. Using a 22-gauge spinal needle, the soft issue and the peripedicular space and periosteum were infiltrated with Bupivacaine 0.5%. A skin incision was made at the access site. Subsequently, an 11-gauge Kyphon trocar was inserted under fluoroscopic guidance until contact with the pedicle was obtained. The trocar was inserted under light hammer tapping into the pedicle until the posterior boundaries of the vertebral body was reached. The diamond mandrill was removed and one core biopsy wasobtained. Attempt to advance a 20 gauge in a 22 gauge spinal needle through the endplate into the disc space proved unsuccessful. The cannula was later removed. Then, under fluoroscopy guidance and using extra pedicular approach a 22 gauge x 20 cm spinal needle was advanced into the T8-9 intervertebral disc. Two fine-needle aspiration samples were obtained with small amount of blood tinged fluid obtained. The access sites were cleaned and covered with a sterile bandage. IMPRESSION: Successful fluoroscopy guided T9 core bone biopsy and T8-9 disc fine-needle aspiration. Core biopsy sample was sent for tissue exam and fine needle aspiration samples were sent for Gram stain and culture. Electronically Signed   By: Anne Earls M.D.   On: 01/27/2022 15:18    ECHOCARDIOGRAM COMPLETE  Result Date: 01/27/2022    ECHOCARDIOGRAM REPORT   Patient Name:   [name] Date of Exam: 01/27/2022 Medical Rec #:  [id number]      Height:       56.0 in Accession #:    [id number]     Weight:       148.0 lb Date of Birth:  11-05-1946      BSA:          1.562 m Patient Age:    76 years       BP:           140/85 mmHg Patient Gender: F              HR:           127 bpm. Exam Location:  Inpatient Procedure: 2D Echo Indications:    Fever  History:        Patient has no prior history of Echocardiogram examinations.                 Signs/Symptoms:Shortness of Breath; Risk Factors:Dyslipidemia,                 Hypertension and Diabetes.  Sonographer:    [name] Referring Phys: [id number] [name]  Sonographer Comments: Technically difficult study due to poor echo windows. IMPRESSIONS  1. Left ventricular ejection fraction, by estimation, is 60 to 65%. Left ventricular ejection fraction by PLAX is 63 %. The left ventricle has normal function. The left ventricle has no regional wall motion abnormalities. There is moderate asymmetric left ventricular hypertrophy of the basal-septal segment. Left ventricular diastolic parameters are consistent with Grade I diastolic dysfunction (impaired relaxation). Elevated left ventricular end-diastolic pressure. The E/e' is 22.  2. Right ventricular systolic function is normal. The right ventricular size is normal. There is normal pulmonary artery systolic pressure. The estimated right ventricular systolic pressure is 82.8 mmHg.  3. The mitral valve is grossly normal. Trivial mitral valve regurgitation.  4. The aortic valve is tricuspid. Aortic valve regurgitation is not visualized.  5. The inferior vena cava is normal in size with greater than  50% respiratory variability, suggesting right atrial pressure of 3 mmHg. Comparison(s): No prior Echocardiogram. FINDINGS  Left Ventricle: Left ventricular ejection fraction, by estimation, is 60 to  65%. Left ventricular ejection fraction by PLAX is 63 %. The left ventricle has normal function. The left ventricle has no regional wall motion abnormalities. The left ventricular internal cavity size was normal in size. There is moderate asymmetric left ventricular hypertrophy of the basal-septal segment. Left ventricular diastolic parameters are consistent with Grade I diastolic dysfunction (impaired relaxation). Elevated left ventricular end-diastolic pressure. The E/e' is 16. Right Ventricle: The right ventricular size is normal. No increase in right ventricular wall thickness. Right ventricular systolic function is normal. There is normal pulmonary artery systolic pressure. The tricuspid regurgitant velocity is 2.49 m/s, and  with an assumed right atrial pressure of 3 mmHg, the estimated right ventricular systolic pressure is 28.7 mmHg. Left Atrium: Left atrial size was normal in size. Right Atrium: Right atrial size was normal in size. Pericardium: There is no evidence of pericardial effusion. Mitral Valve: The mitral valve is grossly normal. Trivial mitral valve regurgitation. MV peak gradient, 9.9 mmHg. The mean mitral valve gradient is 4.0 mmHg. Tricuspid Valve: The tricuspid valve is grossly normal. Tricuspid valve regurgitation is trivial. Aortic Valve: The aortic valve is tricuspid. Aortic valve regurgitation is not visualized. Aortic valve mean gradient measures 5.0 mmHg. Aortic valve peak gradient measures 10.9 mmHg. Aortic valve area, by VTI measures 2.59 cm. Pulmonic Valve: The pulmonic valve was normal in structure. Pulmonic valve regurgitation is not visualized. Aorta: The aortic root and ascending aorta are structurally normal, with no evidence of dilitation. Venous: The inferior vena cava is normal in size with greater than 50% respiratory variability, suggesting right atrial pressure of 3 mmHg. IAS/Shunts: No atrial level shunt detected by color flow Doppler.  LEFT VENTRICLE PLAX 2D LV EF:          Left            Diastology                ventricular     LV e' medial:    4.90 cm/s                ejection        LV E/e' medial:  15.2                fraction by     LV e' lateral:   4.35 cm/s                PLAX is 63      LV E/e' lateral: 17.2                %. LVIDd:         3.30 cm LVIDs:         2.20 cm LV PW:         1.10 cm LV IVS:        1.40 cm LVOT diam:     1.90 cm LV SV:         60 LV SV Index:   38 LVOT Area:     2.84 cm  RIGHT VENTRICLE             IVC RV Basal diam:  3.20 cm     IVC diam: 1.80 cm RV Mid diam:    3.20 cm RV S prime:     16.60 cm/s TAPSE (M-mode): 1.8 cm LEFT ATRIUM  Index        RIGHT ATRIUM           Index LA diam:        2.90 cm 1.86 cm/m   RA Area:     11.30 cm LA Vol (A2C):   44.3 ml 28.36 ml/m  RA Volume:   21.20 ml  13.57 ml/m LA Vol (A4C):   39.4 ml 25.23 ml/m LA Biplane Vol: 43.1 ml 27.59 ml/m  AORTIC VALVE AV Area (Vmax):    2.35 cm AV Area (Vmean):   2.33 cm AV Area (VTI):     2.59 cm AV Vmax:           165.00 cm/s AV Vmean:          106.000 cm/s AV VTI:            0.232 m AV Peak Grad:      10.9 mmHg AV Mean Grad:      5.0 mmHg LVOT Vmax:         137.00 cm/s LVOT Vmean:        87.100 cm/s LVOT VTI:          0.212 m LVOT/AV VTI ratio: 0.91  AORTA Ao Root diam: 2.70 cm Ao Asc diam:  3.10 cm MITRAL VALVE                TRICUSPID VALVE MV Area (PHT): 3.30 cm     TR Peak grad:   24.8 mmHg MV Area VTI:   2.62 cm     TR Vmax:        249.00 cm/s MV Peak grad:  9.9 mmHg MV Mean grad:  4.0 mmHg     SHUNTS MV Vmax:       1.57 m/s     Systemic VTI:  0.21 m MV Vmean:      94.0 cm/s    Systemic Diam: 1.90 cm MV Decel Time: 230 msec MV E velocity: 74.70 cm/s MV A velocity: 130.00 cm/s MV E/A ratio:  0.57 [name] Electronically signed by [name] Signature Date/Time: 01/27/2022/5:24:42 PM    Final      Anne Piedra, [name] [facility]  01/28/2022  9:48 AM

## 2022-01-28 NOTE — Assessment & Plan Note (Addendum)
Replaced and resolved. ?

## 2022-01-28 NOTE — Evaluation (Signed)
Physical Therapy Evaluation ?Patient Details ?Name: [name] ?MRN: [id number] ?DOB: 11-05-1946 ?Today's Date: 01/28/2022 ? ?History of Present Illness ? 76 y/o female presented to ED on 01/26/22 for complaints of severe back pain. Went to urgent care on 3/5 but no cause was found on imaging. 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 PMH: RA, DM, HTN, hx of ACDF 2017, hx of back sx 2019  ?Clinical Impression ? Patient admitted with above diagnosis. Patient presents with generalized weakness, decreased activity tolerance, and pain. Patient currently limited by increased pain with movement. Patient able to perform bed mobility with minA and min guard for sit to stand transfer and short ambulation distance with RW. Educated patient on log roll technique for comfort. Patient plans to discharge home with daughter but needs to be at supervision level to mobilize to/from bathroom. Patient will benefit from skilled PT services during acute stay to address listed deficits. Recommend HHPT at discharge to maximize functional independence and strengthening.    ?   ? ?Recommendations for follow up therapy are one component of a multi-disciplinary discharge planning process, led by the attending physician.  Recommendations may be updated based on patient status, additional functional criteria and insurance authorization. ? ?Follow Up Recommendations Home health PT ? ?  ?Assistance Recommended at Discharge Intermittent Supervision/Assistance  ?Patient can return home with the following ? A little help with walking and/or transfers;A little help with bathing/dressing/bathroom;Assistance with cooking/housework;Help with stairs or ramp for entrance;Assist for transportation ? ?  ?Equipment Recommendations Rolling Denisse Whitenack (2 wheels)  ?Recommendations for Other  Services ?    ?  ?Functional Status Assessment Patient has had a recent decline in their functional status and demonstrates the ability to make significant improvements in function in a reasonable and predictable amount of time.  ? ?  ?Precautions / Restrictions Precautions ?Precautions: Back ?Precaution Booklet Issued: No ?Precaution Comments: Education regarding log roll ?Restrictions ?Weight Bearing Restrictions: No  ? ?  ? ?Mobility ? Bed Mobility ?Overal bed mobility: Needs Assistance ?Bed Mobility: Rolling, Sidelying to Sit, Sit to Sidelying ?Rolling: Min guard ?Sidelying to sit: Min assist ?  ?  ?Sit to sidelying: Min assist ?General bed mobility comments: minA for trunk elevation and bringing LEs back onto bed ?  ? ?Transfers ?Overall transfer level: Needs assistance ?Equipment used: Rolling Markeesha Char (2 wheels) ?Transfers: Sit to/from Stand ?Sit to Stand: Min guard ?  ?  ?  ?  ?  ?General transfer comment: min guard for safety. No physical assistance required just increased time to complete due to pain ?  ? ?Ambulation/Gait ?Ambulation/Gait assistance: Min guard ?Gait Distance (Feet): 15 Feet ?Assistive device: Rolling Noelly Lasseigne (2 wheels) ?Gait Pattern/deviations: Step-through pattern, Decreased stride length ?Gait velocity: decreased ?  ?  ?General Gait Details: min guard for safety. Increased pain with ambulation. No overt LOB noted ? ?Stairs ?  ?  ?  ?  ?  ? ?Wheelchair Mobility ?  ? ?Modified Rankin (Stroke Patients Only) ?  ? ?  ? ?Balance Overall balance assessment: Needs assistance ?Sitting-balance support: Feet unsupported, Single extremity supported ?Sitting balance-Leahy Scale: Good ?  ?  ?Standing balance support: Reliant on assistive device for balance ?Standing balance-Leahy Scale: Poor ?  ?  ?  ?  ?  ?  ?  ?  ?  ?  ?  ?  ?   ? ? ? ?  Pertinent Vitals/Pain Pain Assessment ?Pain Assessment: Faces ?Faces Pain Scale: Hurts worst ?Pain Location: mid back ?Pain Descriptors / Indicators: Guarding,  Grimacing ?Pain Intervention(s): Monitored during session  ? ? ?Home Living Family/patient expects to be discharged to:: Private residence ?Living Arrangements: Spouse/significant other ?Available Help at Discharge: Family;Available PRN/intermittently;Other (Comment) (Depending on if she returns home, or to daughter's home.) ?Type of Home: House ?  ?  ?  ?  ?Home Layout: One level ?Home Equipment: Rollator (4 wheels);Cane - quad;Cane - single point;Shower seat - built in;Adaptive equipment;Hand held shower head ?   ?  ?Prior Function Prior Level of Function : Independent/Modified Independent;Working/employed;Driving ?  ?  ?  ?  ?  ?  ?  ?ADLs Comments: Works as a Engineer, civil (consulting) for kindergarten class. ?  ? ? ?Hand Dominance  ? Dominant Hand: Right ? ?  ?Extremity/Trunk Assessment  ? Upper Extremity Assessment ?Upper Extremity Assessment: Defer to OT evaluation ?  ? ?Lower Extremity Assessment ?Lower Extremity Assessment: Generalized weakness ?  ? ?Cervical / Trunk Assessment ?Cervical / Trunk Assessment: Kyphotic  ?Communication  ? Communication: No difficulties  ?Cognition Arousal/Alertness: Awake/alert ?Behavior During Therapy: [facility] for tasks assessed/performed ?Overall Cognitive Status: Within Functional Limits for tasks assessed ?  ?  ?  ?  ?  ?  ?  ?  ?  ?  ?  ?  ?  ?  ?  ?  ?  ?  ?  ? ?  ?General Comments   ? ?  ?Exercises    ? ?Assessment/Plan  ?  ?PT Assessment Patient needs continued PT services  ?PT Problem List Decreased strength;Decreased activity tolerance;Decreased balance;Decreased mobility;Decreased knowledge of precautions;Pain ? ?   ?  ?PT Treatment Interventions DME instruction;Gait training;Functional mobility training;Stair training;Therapeutic activities;Therapeutic exercise;Balance training;Patient/family education   ? ?PT Goals (Current goals can be found in the Care Plan section)  ?Acute Rehab PT Goals ?Patient Stated Goal: to reduce pain ?PT Goal Formulation: With patient ?Time For Goal  Achievement: 02/11/22 ?Potential to Achieve Goals: Good ? ?  ?Frequency Min 4X/week ?  ? ? ?Co-evaluation   ?  ?  ?  ?  ? ? ?  ?AM-PAC PT "6 Clicks" Mobility  ?Outcome Measure Help needed turning from your back to your side while in a flat bed without using bedrails?: A Little ?Help needed moving from lying on your back to sitting on the side of a flat bed without using bedrails?: A Little ?Help needed moving to and from a bed to a chair (including a wheelchair)?: A Little ?Help needed standing up from a chair using your arms (e.g., wheelchair or bedside chair)?: A Little ?Help needed to walk in hospital room?: A Little ?Help needed climbing 3-5 steps with a railing? : A Little ?6 Click Score: 18 ? ?  ?End of Session   ?Activity Tolerance: Patient limited by pain ?Patient left: in bed;with call bell/phone within reach;with bed alarm set ?Nurse Communication: Mobility status ?PT Visit Diagnosis: Unsteadiness on feet (R26.81);Muscle weakness (generalized) (M62.81);Pain ?Pain - part of body:  (back) ?  ? ?Time: 6237-6283 ?PT Time Calculation (min) (ACUTE ONLY): 19 min ? ? ?Charges:   PT Evaluation ?$PT Eval Moderate Complexity: 1 Mod ?  ?  ?   ? ? ?Ayjah Show A. Gilford Rile, PT, DPT ?Acute Rehabilitation Services ?Pager [phone] ?Office [phone] ? ? ?Kenlie Seki A Ricca Melgarejo ?01/28/2022, 3:41 PM ? ?

## 2022-01-28 NOTE — Assessment & Plan Note (Addendum)
Underwent IR bone biopsy/aspiration as above.   ?Neurosurgery following, recommend non operative management with antibiotics.   ?ID recommended total 8 weeks of IV antibiotics. ?Continued vancomycin and  Ceftriaxone. ?Antibiotic changed to Ancef since cultures growing MSSA. ?Needs 6 to 8 weeks of IV antibiotics from 01/27/2022 ?

## 2022-01-29 DIAGNOSIS — M4644 Discitis, unspecified, thoracic region: Secondary | ICD-10-CM | POA: Diagnosis not present

## 2022-01-29 DIAGNOSIS — Z5181 Encounter for therapeutic drug level monitoring: Secondary | ICD-10-CM

## 2022-01-29 LAB — BASIC METABOLIC PANEL
Anion gap: 10 (ref 5–15)
Anion gap: 13 (ref 5–15)
BUN: 8 mg/dL (ref 8–23)
BUN: 7 mg/dL — ABNORMAL LOW (ref 8–23)
CO2: 26 mmol/L (ref 22–32)
CO2: 24 mmol/L (ref 22–32)
Calcium: 8.7 mg/dL — ABNORMAL LOW (ref 8.9–10.3)
Calcium: 8.7 mg/dL — ABNORMAL LOW (ref 8.9–10.3)
Chloride: 101 mmol/L (ref 98–111)
Chloride: 98 mmol/L (ref 98–111)
Creatinine, Ser: 0.7 mg/dL (ref 0.44–1.00)
Creatinine, Ser: 0.56 mg/dL (ref 0.44–1.00)
GFR, Estimated: >60 mL/min (ref >60)
GFR, Estimated: >60 mL/min (ref >60)
Glucose, Bld: 104 mg/dL — ABNORMAL HIGH (ref 70–99)
Glucose, Bld: 109 mg/dL — ABNORMAL HIGH (ref 70–99)
Potassium: 3.6 mmol/L (ref 3.5–5.1)
Potassium: 3.8 mmol/L (ref 3.5–5.1)
Sodium: 137 mmol/L (ref 135–145)
Sodium: 135 mmol/L (ref 135–145)

## 2022-01-29 LAB — MAGNESIUM: Magnesium: 1.8 mg/dL (ref 1.7–2.4)

## 2022-01-29 LAB — CBC
HCT: 35 % — ABNORMAL LOW (ref 36.0–46.0)
HCT: 36.2 % (ref 36.0–46.0)
Hemoglobin: 11.6 g/dL — ABNORMAL LOW (ref 12.0–15.0)
Hemoglobin: 12 g/dL (ref 12.0–15.0)
MCH: 27 pg (ref 26.0–34.0)
MCH: 26.9 pg (ref 26.0–34.0)
MCHC: 33.1 g/dL (ref 30.0–36.0)
MCHC: 33.1 g/dL (ref 30.0–36.0)
MCV: 81.6 fL (ref 80.0–100.0)
MCV: 81.2 fL (ref 80.0–100.0)
Platelets: 216 10*3/uL (ref 150–400)
Platelets: 250 10*3/uL (ref 150–400)
RBC: 4.29 MIL/uL (ref 3.87–5.11)
RBC: 4.46 MIL/uL (ref 3.87–5.11)
RDW: 17.1 % — ABNORMAL HIGH (ref 11.5–15.5)
RDW: 16.9 % — ABNORMAL HIGH (ref 11.5–15.5)
WBC: 19.5 10*3/uL — ABNORMAL HIGH (ref 4.0–10.5)
WBC: 20.7 10*3/uL — ABNORMAL HIGH (ref 4.0–10.5)
nRBC: 0 % (ref 0.0–0.2)
nRBC: 0 % (ref 0.0–0.2)

## 2022-01-29 LAB — ACID FAST SMEAR (AFB, MYCOBACTERIA): Acid Fast Smear: NEGATIVE

## 2022-01-29 LAB — GLUCOSE, CAPILLARY
Glucose-Capillary: 157 mg/dL — ABNORMAL HIGH (ref 70–99)
Glucose-Capillary: 120 mg/dL — ABNORMAL HIGH (ref 70–99)
Glucose-Capillary: 124 mg/dL — ABNORMAL HIGH (ref 70–99)
Glucose-Capillary: 115 mg/dL — ABNORMAL HIGH (ref 70–99)

## 2022-01-29 LAB — VANCOMYCIN, PEAK: Vancomycin Pk: 14 ug/mL — ABNORMAL LOW (ref 30–40)

## 2022-01-29 MED ORDER — POLYETHYLENE GLYCOL 3350 17 G PO PACK
17.0000 g | PACK | Freq: Every day | ORAL | Status: DC
  Administered 2022-01-29 – 2022-01-31 (×3): 17 g via ORAL
  Filled 2022-01-29 (×3): qty 1

## 2022-01-29 MED ORDER — POTASSIUM CHLORIDE 20 MEQ PO PACK
40.0000 meq | PACK | Freq: Once | ORAL | Status: AC
  Administered 2022-01-29: 09:00:00 40 meq via ORAL
  Filled 2022-01-29: qty 2

## 2022-01-29 NOTE — Progress Notes (Signed)
[facility] Infectious Diseases Follow Up Note  Patient Identification: Patient Name: [name] MRN: [id number] [place] Date: 01/26/2022 12:34 PM Age: 76 y.o.Today's Date: 01/29/2022  Reason for Visit: Discitis   Principal Problem:   Sepsis ([place]) discitis/osteomyelitis, epidural phlegmon Active Problems:   HTN (hypertension)   Diabetes ([place])   Centrilobular emphysema ([place])   Discitis   Rheumatoid arthritis ([place])   Hyperlipidemia   Epidural abscess   Acute urinary retention   Hypokalemia   Antibiotics:  Vancomycin 3/6-c Metronidazole 3/6 Cefepime 3/6, Ceftriaxone 3/7-c  Lines/Hardwares: Bilateral hip replacements   Interval Events: afebrile, WBC up to 19.5   Assessment # Thoracic discitis/Epidural phlegmon which appear to be chronic discitis and osteomyelitis in MRI 3/6 blood cx no growth in 3 days  3/7 IR aspirate cx growing staph aureus  TTE unremarkable for vegetations or endocarditis   # RA on methotrexate and etanercept   # 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 DC ceftriaxone  Fu blood cultures and IR aspirate cultures  Will need 6 to 8 weeks of IV abtx from 01/27/22 Monitor CBC, BMP PT and OT following  Following   Rest of the management as per the primary team. Thank you for the consult. Please page with pertinent questions or concerns.  ______________________________________________________________________ Subjective patient seen and examined at the bedside. She is sitting in a recliner Back pain is well controlled No new symptoms  Vitals BP (!) 157/89 (BP Location: Left Arm)    Pulse 86    Temp 98.3 F (36.8 C) (Oral)    Resp 19    Ht '4\' 8"'$  (1.422 m)    Wt 67.1 kg    SpO2 93%    BMI 33.18 kg/m     Physical  Exam Constitutional:  sitting up in the recliner and appears comfortable     Comments:   Cardiovascular:     Rate and Rhythm: Normal rate and regular rhythm.     Heart sounds:   Pulmonary:     Effort: Pulmonary effort is normal on room air     Comments:   Abdominal:     Palpations: Abdomen is soft.     Tenderness:   Musculoskeletal:        General: No swelling or tenderness.   Skin:    Comments:   Neurological:     General: Grossly non focal, awake, alert and oriented   Psychiatric:        Mood and Affect: Mood normal.   Pertinent Microbiology Results for orders placed or performed during the hospital encounter of 01/26/22  Resp Panel by RT-PCR (Flu A&B, Covid) Nasopharyngeal Swab     Status: None   Collection Time: 01/26/22  2:06 PM   Specimen: Nasopharyngeal Swab; Nasopharyngeal(NP) swabs in vial transport medium  Result Value Ref Range Status   SARS Coronavirus 2 by RT PCR NEGATIVE NEGATIVE Final    Comment: (NOTE) SARS-CoV-2 target nucleic acids are NOT DETECTED.  The SARS-CoV-2 RNA is generally detectable in upper respiratory specimens during the acute phase of infection. The lowest concentration of SARS-CoV-2 viral copies this assay can detect is 138 copies/mL. A negative result does not preclude SARS-Cov-2 infection and should not be used as the sole basis for treatment or other patient management decisions. A negative result may occur with  improper specimen collection/handling, submission of specimen other than  nasopharyngeal swab, presence of viral mutation(s) within the areas targeted by this assay, and inadequate number of viral copies(<138 copies/mL). A negative result must be combined with clinical observations, patient history, and epidemiological information. The expected result is Negative.  Fact Sheet for Patients:  EntrepreneurPulse.com.au  Fact Sheet for Healthcare Providers:  IncredibleEmployment.be  This  test is no t yet approved or cleared by the [place] FDA and  has been authorized for detection and/or diagnosis of SARS-CoV-2 by FDA under an Emergency Use Authorization (EUA). This EUA will remain  in effect (meaning this test can be used) for the duration of the COVID-19 declaration under Section 564(b)(1) of the Act, 21 U.S.C.section 360bbb-3(b)(1), unless the authorization is terminated  or revoked sooner.       Influenza A by PCR NEGATIVE NEGATIVE Final   Influenza B by PCR NEGATIVE NEGATIVE Final    Comment: (NOTE) The Xpert Xpress SARS-CoV-2/FLU/RSV plus assay is intended as an aid in the diagnosis of influenza from Nasopharyngeal swab specimens and should not be used as a sole basis for treatment. Nasal washings and aspirates are unacceptable for Xpert Xpress SARS-CoV-2/FLU/RSV testing.  Fact Sheet for Patients: EntrepreneurPulse.com.au  Fact Sheet for Healthcare Providers: IncredibleEmployment.be  This test is not yet approved or cleared by the [place] FDA and has been authorized for detection and/or diagnosis of SARS-CoV-2 by FDA under an Emergency Use Authorization (EUA). This EUA will remain in effect (meaning this test can be used) for the duration of the COVID-19 declaration under Section 564(b)(1) of the Act, 21 U.S.C. section 360bbb-3(b)(1), unless the authorization is terminated or revoked.  Performed at [facility], [place] [address] [place], [place] [address]   Blood Culture (routine x 2)     Status: None (Preliminary result)   Collection Time: 01/26/22  9:51 PM   Specimen: BLOOD RIGHT HAND  Result Value Ref Range Status   Specimen Description BLOOD RIGHT HAND  Final   Special Requests   Final    BOTTLES DRAWN AEROBIC AND ANAEROBIC Blood Culture adequate volume   Culture   Final    NO GROWTH 3 DAYS Performed at [facility], [place] [address] [place], [place] [address]    Report Status PENDING   Incomplete  Urine Culture     Status: None   Collection Time: 01/27/22  1:47 AM   Specimen: Urine, Random  Result Value Ref Range Status   Specimen Description URINE, RANDOM  Final   Special Requests NONE  Final   Culture   Final    NO GROWTH Performed at [facility], [address], [place], [place] [address]    Report Status 01/28/2022 FINAL  Final  Aerobic/Anaerobic Culture w Gram Stain (surgical/deep wound)     Status: None (Preliminary result)   Collection Time: 01/27/22  2:03 PM   Specimen: Wound; Tissue  Result Value Ref Range Status   Specimen Description WOUND  Final   Special Requests INTERVERTEBRAL DISC T8/T9  Final   Gram Stain   Final    NO WBC SEEN NO ORGANISMS SEEN Performed at [facility], [address] [place], [place] [address]    Culture   Final    FEW STAPHYLOCOCCUS AUREUS NO ANAEROBES ISOLATED; CULTURE IN PROGRESS FOR 5 DAYS    Report Status PENDING  Incomplete  Culture, fungus without smear     Status: None (Preliminary result)   Collection Time: 01/27/22  2:03 PM   Specimen: Wound;  Other  Result Value Ref Range Status   Specimen Description WOUND  Final   Special Requests INTERVERTEBRAL DISC T8/T9  Final   Culture   Final    NO FUNGUS ISOLATED AFTER 2 DAYS Performed at [facility], [address] [place], [place] [address]    Report Status PENDING  Incomplete  KOH prep     Status: None   Collection Time: 01/27/22  2:03 PM   Specimen: Wound  Result Value Ref Range Status   Specimen Description   Final    WOUND Performed at [facility], [place] [address] [place], [place] [address]    Special Requests   Final    INTERVERTEBRAL DISC T8/T9 Performed at [facility], [place] [address] [place], [place] [address]    [place] Prep   Final    NO YEAST OR FUNGAL ELEMENTS SEEN Performed at [facility], [address] [place], [place] [address]    Report Status 01/28/2022 FINAL  Final  Acid Fast Smear (AFB)     Status:  None   Collection Time: 01/27/22  2:03 PM   Specimen: Tissue  Result Value Ref Range Status   AFB Specimen Processing Concentration  Final   Acid Fast Smear Negative  Final    Comment: (NOTE) Performed At: [facility] [place], [place] [id number] Rush Farmer MD [id number]    Source (AFB) WOUND  Final    Comment: INTERVERTEBRAL DISC T8/T9 Performed at [facility], [place] [address] [place], [place] [address]     Pertinent Lab. CBC Latest Ref Rng & Units 01/29/2022 01/28/2022 01/27/2022  WBC 4.0 - 10.5 K/uL 19.5(H) 14.8(H) 25.7(H)  Hemoglobin 12.0 - 15.0 g/dL 11.6(L) 11.9(L) 12.5  Hematocrit 36.0 - 46.0 % 35.0(L) 35.7(L) 39.0  Platelets 150 - 400 K/uL 216 206 244   CMP Latest Ref Rng & Units 01/29/2022 01/28/2022 01/27/2022  Glucose 70 - 99 mg/dL 104(H) 80 91  BUN 8 - 23 mg/dL '8 13 19  '$ Creatinine 0.44 - 1.00 mg/dL 0.70 0.70 0.87  Sodium 135 - 145 mmol/L 137 135 135  Potassium 3.5 - 5.1 mmol/L 3.6 3.4(L) 3.9  Chloride 98 - 111 mmol/L 101 101 101  CO2 22 - 32 mmol/L 26 24 17(L)  Calcium 8.9 - 10.3 mg/dL 8.7(L) 8.8(L) 8.9  Total Protein 6.5 - 8.1 g/dL - - 7.0  Total Bilirubin 0.3 - 1.2 mg/dL - - 0.8  Alkaline Phos 38 - 126 U/L - - 94  AST 15 - 41 U/L - - 24  ALT 0 - 44 U/L - - 19     Pertinent Imaging today Plain films and CT images have been personally visualized and interpreted; radiology reports have been reviewed. Decision making incorporated into the Impression / Recommendations.  No results found.  I spent more than 35 minutes for this patient encounter including review of prior medical records, coordination of care with primary/other specialist with greater than 50% of time being face to face/counseling and discussing diagnostics/treatment plan with the patient/family.  Electronically signed by:   Rosiland Oz, MD Infectious Disease Physician [facility] for Infectious Disease Pager: [phone]

## 2022-01-29 NOTE — Progress Notes (Signed)
PROGRESS NOTE    [name]  VZC:[id number] DOB: 07-12-1946 DOA: 01/26/2022 PCP: Merrilee Seashore, MD    Brief Narrative:  [name] is a 76 year old female with PMH significant for rheumatoid arthritis, type 2 diabetes mellitus, essential hypertension, osteoarthritis s/p bilateral hip replacements, history of ACDF 2017 and lumbar laminectomy/decompression with microdiscectomy 2019, history of previous MRSA infection requiring 6-week outpatient antibiotic course who presented to [facility] ED on 3/6 for back pain that has been progressing over the last 3 days.  Patient reports onset 3 days ago with pain in upper back with radiation to the chest.  Denies any incontinence of urine, bowel; no lower extremity weakness and no paresthesias.  In the ED, temperature 102.2 F, HR 127, RR 25, BP 156/90, SPO2 94% on room air.  WBC 25.8, hemoglobin 13.0, platelet count 256.  Sodium 137, potassium 3.0, chloride 100, CO2 25, glucose 116, BUN 19, creatinine 0.94.  Lipase 26, AST 20, ALT 21, total bilirubin 1.0.  Lactic acid 2.7.  Cova-19 PCR negative.  Influenza A/B PCR negative.  INR 1.1.  Urinalysis unrevealing.  Chest x-ray with no acute cardiopulmonary disease process.  MR L/T-spine with and without contrast with extensive epidural enhancement thoracic canal compatible with epidural phlegmon, sclerotic changes T8, T9, T10 with mild edema suspicious for discitis/osteomyelitis, mild/moderate lumbar stenosis and moderate to severe foraminal stenosis thoracic spine.  EDP consulted neurosurgery, Dr. Christella Noa who reviewed imaging studies and recommended IR for needle biopsy of T8 or T9 and to continue antibiotic treatment.  TRH consulted for further evaluation and management of osteomyelitis/discitis with concern of epidural phlegmon.     Assessment and Plan: * Sepsis ([place]) discitis/osteomyelitis, epidural phlegmon Patient presented with progressive back pain. She was septic on arrival. MRI L/T-spine with and  without contrast with extensive epidural enhancement, thoracic canal compatible with epidural phlegmon, sclerotic changes T8, T9, T10 with mild edema suspicious for discitis/osteomyelitis, mild/moderate lumbar stenosis and moderate to severe foraminal stenosis thoracic spine.  Likely complicated by history of immunosuppression with history of rheumatoid arthritis on immunosuppression with Enbrel/methotrexate.  Evaluated by neurosurgery who recommended nonoperative management with IR consultation for biopsy / aspiration and antibiotic therapy TTE with LVEF 60 to 12%, grade 1 diastolic dysfunction, no LV regional wall motion abnormalities, trivial MR, IVC normal.   She underwent T9 core bone biopsy and T8-9 disc aspiration by IR on 3/7.   Lactic acidosis now resolved. Neurosurgery and infectious disease following, appreciate assistance WBC 27.5>19.5 Hold Enbrel/methotrexate. Blood cultures x 2: No growth x 2 days T8-9 culture, no organisms/WBC seen, further pending AFB and acid-fast culture: Pending Continue vancomycin and ceftriaxone   HTN (hypertension) Continue Cardizem enalapril, hydralazine as needed., Holding Bumex.  Diabetes ([place]) Most recent Hb A1c in EMR 5.3 2019. --SSI for coverage   Centrilobular emphysema ([place]) On Trelegy Ellipta as outpatient. Continue Incruse Ellipta Breo Ellipta as hospital substitutions. Albuterol neb every 4 hours as needed shortness of breath/wheezing  Rheumatoid arthritis ([place]) Patient is on methotrexate and Enbrel outpatient. Hold immunosuppressive medications in the setting of sepsis as above.  Hyperlipidemia Continue pravastatin.  Hypokalemia Potassium 3.4 this morning, will replete.  Repeat electrolytes in a.m. to include magnesium.  Acute urinary retention Overnight, patient required to in and out catheterizations for acute urinary retention.  Likely complicated by her poor mobility with epidural abscess/discitis,  osteomyelitis. --Continue bladder scan as needed, if requires another in and out catheterization, likely will place Foley catheter.  Epidural abscess Underwent IR bone biopsy/aspiration as above.  Neurosurgery following, recommend non operative management with antibiotics.   ID recommended total 8 weeks of IV antibiotics. Continue vancomycin and  ceftriaxone  Discitis -- Treatment as above     DVT prophylaxis: SCDs Start: 01/26/22 2326    Code Status: Full Code Family Communication: No family at bed side.  Disposition Plan:  Level of care: Progressive Status is: Inpatient Remains inpatient appropriate because: Admitted for epidural phlegmon,  underwent IR guided biopsy,  aspiration of discitis now requiring IV antibiotics.  Consultants:  Neurosurgery, Dr. Christella Noa, Dr. Arnoldo Morale Infectious disease, Dr. Montel Culver  Procedures:  None  Antimicrobials:  Vancomycin 3/6>> Ceftriaxone 3/6>> Cefepime 3/6 - 3/6 Metronidazole 3/6 - 3/6   Subjective: Patient was seen and examined at bedside.  Overnight events noted. Patient reports having back pain, still has some lower abdominal pain. Objective: Vitals:   01/29/22 0349 01/29/22 0748 01/29/22 0822 01/29/22 1113  BP: (!) 147/81 (!) 176/85 (!) 152/78 (!) 157/89  Pulse: 92 94 96 86  Resp: (!) '22 17 15 19  '$ Temp: 99.2 F (37.3 C) 98.8 F (37.1 C)  98.3 F (36.8 C)  TempSrc: Oral Oral  Oral  SpO2: 98% 93% 94% 93%  Weight:      Height:        Intake/Output Summary (Last 24 hours) at 01/29/2022 1341 Last data filed at 01/29/2022 1252 Gross per 24 hour  Intake 720 ml  Output 1250 ml  Net -530 ml   Filed Weights   01/26/22 1243  Weight: 67.1 kg    Examination:  Physical Exam: GEN: Appears comfortable, not in any acute distress.  Deconditioned HEENT: NCAT, PERRL, EOMI, sclera clear, MMM PULM: CTA bilaterally, no wheezing, no crackles, respiratory effort normal. CV: S1-S2 heard, regular rate and rhythm, no murmur. GI:  Abdomen is soft, non tender, non distended, BS+ MSK: no peripheral edema, muscle strength globally intact 5/5 bilateral upper/lower extremities NEURO: CN II-XII intact, no focal deficits, sensation to light touch intact PSYCH: normal mood/affect Integumentary: dry/intact, no rashes or wounds    Data Reviewed: I have personally reviewed following labs and imaging studies  CBC: Recent Labs  Lab 01/25/22 1536 01/26/22 1406 01/27/22 0408 01/28/22 0651 01/29/22 0550  WBC 15.8* 25.8* 25.7* 14.8* 19.5*  NEUTROABS  --  23.0*  --   --   --   HGB 13.2 13.0 12.5 11.9* 11.6*  HCT 40.9 40.5 39.0 35.7* 35.0*  MCV 85.0 84.7 86.9 83.2 81.6  PLT 327 256 244 206 528   Basic Metabolic Panel: Recent Labs  Lab 01/25/22 1536 01/26/22 1406 01/27/22 0408 01/28/22 0651 01/29/22 0550  NA 140 137 135 135 137  K 3.5 3.0* 3.9 3.4* 3.6  CL 104 100 101 101 101  CO2 25 25 17* 24 26  GLUCOSE 128* 116* 91 80 104*  BUN '14 19 19 13 8  '$ CREATININE 0.72 0.94 0.87 0.70 0.70  CALCIUM 9.5 9.0 8.9 8.8* 8.7*  MG  --   --   --   --  1.8   GFR: Estimated Creatinine Clearance: 48.1 mL/min (by C-G formula based on SCr of 0.7 mg/dL). Liver Function Tests: Recent Labs  Lab 01/25/22 1700 01/26/22 1406 01/27/22 0408  AST 32 20 24  ALT '24 21 19  '$ ALKPHOS 81 84 94  BILITOT 1.0 1.0 0.8  PROT 7.3 7.1 7.0  ALBUMIN 4.0 3.6 3.3*   Recent Labs  Lab 01/25/22 1700 01/26/22 1406  LIPASE 30 26   No results for input(s): AMMONIA in the last  168 hours. Coagulation Profile: Recent Labs  Lab 01/26/22 1406  INR 1.1   Cardiac Enzymes: No results for input(s): CKTOTAL, CKMB, CKMBINDEX, TROPONINI in the last 168 hours. BNP (last 3 results) No results for input(s): PROBNP in the last 8760 hours. HbA1C: Recent Labs    01/27/22 1810  HGBA1C 5.4   CBG: Recent Labs  Lab 01/28/22 1146 01/28/22 1609 01/28/22 2110 01/29/22 0747 01/29/22 1115  GLUCAP 131* 110* 143* 157* 120*   Lipid Profile: No results for  input(s): CHOL, HDL, LDLCALC, TRIG, CHOLHDL, LDLDIRECT in the last 72 hours. Thyroid Function Tests: No results for input(s): TSH, T4TOTAL, FREET4, T3FREE, THYROIDAB in the last 72 hours. Anemia Panel: No results for input(s): VITAMINB12, FOLATE, FERRITIN, TIBC, IRON, RETICCTPCT in the last 72 hours. Sepsis Labs: Recent Labs  Lab 01/26/22 1406 01/27/22 0808 01/27/22 1810  LATICACIDVEN 2.7* 1.0 1.2    Recent Results (from the past 240 hour(s))  Resp Panel by RT-PCR (Flu A&B, Covid) Nasopharyngeal Swab     Status: None   Collection Time: 01/26/22  2:06 PM   Specimen: Nasopharyngeal Swab; Nasopharyngeal(NP) swabs in vial transport medium  Result Value Ref Range Status   SARS Coronavirus 2 by RT PCR NEGATIVE NEGATIVE Final    Comment: (NOTE) SARS-CoV-2 target nucleic acids are NOT DETECTED.  The SARS-CoV-2 RNA is generally detectable in upper respiratory specimens during the acute phase of infection. The lowest concentration of SARS-CoV-2 viral copies this assay can detect is 138 copies/mL. A negative result does not preclude SARS-Cov-2 infection and should not be used as the sole basis for treatment or other patient management decisions. A negative result may occur with  improper specimen collection/handling, submission of specimen other than nasopharyngeal swab, presence of viral mutation(s) within the areas targeted by this assay, and inadequate number of viral copies(<138 copies/mL). A negative result must be combined with clinical observations, patient history, and epidemiological information. The expected result is Negative.  Fact Sheet for Patients:  EntrepreneurPulse.com.au  Fact Sheet for Healthcare Providers:  IncredibleEmployment.be  This test is no t yet approved or cleared by the [place] FDA and  has been authorized for detection and/or diagnosis of SARS-CoV-2 by FDA under an Emergency Use Authorization (EUA). This EUA will  remain  in effect (meaning this test can be used) for the duration of the COVID-19 declaration under Section 564(b)(1) of the Act, 21 U.S.C.section 360bbb-3(b)(1), unless the authorization is terminated  or revoked sooner.       Influenza A by PCR NEGATIVE NEGATIVE Final   Influenza B by PCR NEGATIVE NEGATIVE Final    Comment: (NOTE) The Xpert Xpress SARS-CoV-2/FLU/RSV plus assay is intended as an aid in the diagnosis of influenza from Nasopharyngeal swab specimens and should not be used as a sole basis for treatment. Nasal washings and aspirates are unacceptable for Xpert Xpress SARS-CoV-2/FLU/RSV testing.  Fact Sheet for Patients: EntrepreneurPulse.com.au  Fact Sheet for Healthcare Providers: IncredibleEmployment.be  This test is not yet approved or cleared by the [place] FDA and has been authorized for detection and/or diagnosis of SARS-CoV-2 by FDA under an Emergency Use Authorization (EUA). This EUA will remain in effect (meaning this test can be used) for the duration of the COVID-19 declaration under Section 564(b)(1) of the Act, 21 U.S.C. section 360bbb-3(b)(1), unless the authorization is terminated or revoked.  Performed at [facility], [place] [address], [place], [place] [address]   Blood Culture (routine x 2)     Status: None (Preliminary result)  Collection Time: 01/26/22  9:51 PM   Specimen: BLOOD RIGHT HAND  Result Value Ref Range Status   Specimen Description BLOOD RIGHT HAND  Final   Special Requests   Final    BOTTLES DRAWN AEROBIC AND ANAEROBIC Blood Culture adequate volume   Culture   Final    NO GROWTH 3 DAYS Performed at [facility], [address], [place], [place] [address]    Report Status PENDING  Incomplete  Urine Culture     Status: None   Collection Time: 01/27/22  1:47 AM   Specimen: Urine, Random  Result Value Ref Range Status   Specimen Description URINE, RANDOM  Final   Special  Requests NONE  Final   Culture   Final    NO GROWTH Performed at [facility], [address], [place], [place] [address]    Report Status 01/28/2022 FINAL  Final  Aerobic/Anaerobic Culture w Gram Stain (surgical/deep wound)     Status: None (Preliminary result)   Collection Time: 01/27/22  2:03 PM   Specimen: Wound; Tissue  Result Value Ref Range Status   Specimen Description WOUND  Final   Special Requests INTERVERTEBRAL DISC T8/T9  Final   Gram Stain   Final    NO WBC SEEN NO ORGANISMS SEEN Performed at [facility], [address] [place], [place] [address]    Culture   Final    [place] STAPHYLOCOCCUS AUREUS NO ANAEROBES ISOLATED; CULTURE IN PROGRESS FOR 5 DAYS    Report Status PENDING  Incomplete  Culture, fungus without smear     Status: None (Preliminary result)   Collection Time: 01/27/22  2:03 PM   Specimen: Wound; Other  Result Value Ref Range Status   Specimen Description WOUND  Final   Special Requests INTERVERTEBRAL DISC T8/T9  Final   Culture   Final    NO FUNGUS ISOLATED AFTER 2 DAYS Performed at [facility], [address], [place], [place] [address]    Report Status PENDING  Incomplete  KOH prep     Status: None   Collection Time: 01/27/22  2:03 PM   Specimen: Wound  Result Value Ref Range Status   Specimen Description   Final    WOUND Performed at [facility], [place] [address], [place], [place] [address]    Special Requests   Final    INTERVERTEBRAL DISC T8/T9 Performed at [facility], [place] [address] [place], [place] [address]    [place] Prep   Final    NO YEAST OR FUNGAL ELEMENTS SEEN Performed at [facility], [address], [place], [place] [address]    Report Status 01/28/2022 FINAL  Final  Acid Fast Smear (AFB)     Status: None   Collection Time: 01/27/22  2:03 PM   Specimen: Tissue  Result Value Ref Range Status   AFB Specimen Processing Concentration  Final   Acid Fast Smear Negative  Final    Comment:  (NOTE) Performed At: [facility] [place], [place] [id number] Rush Farmer MD TI:[id number]    Source (AFB) WOUND  Final    Comment: INTERVERTEBRAL DISC T8/T9 Performed at [facility], [place] [address] [place], [place] [address]    Radiology Studies: IR Fluoro Guide Ndl Plmt / BX  Result Date: 01/27/2022 INDICATION: [name] is a 76 y.o. female with a past medical history significant for RA, DM, HTN who presented to the ED  on 01/26/22 with worsening upper back pain that radiates to her chest X 3 day. Found to be tachycardic and febrile with leukocystosis and elevated lactic acid. 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 She comes to our service today for a T9 vertebral body core bone biopsy with T8-9 disc aspiration. EXAM: Fluoroscopy guided T9 core bone biopsy and T8-9 fine-needle aspiration MEDICATIONS: Ancef 1 g IV. ANESTHESIA/SEDATION: Moderate (conscious) sedation was employed during this procedure. A total of Versed 2 mg and Fentanyl 75 mcg was administered intravenously by the radiology nurse. Total intra-service moderate Sedation Time: 45 minutes. The patient's level of consciousness and vital signs were monitored continuously by radiology nursing throughout the procedure under my direct supervision. FLUOROSCOPY: Radiation Exposure Index (as provided by the fluoroscopic device): 6314 mGy Kerma COMPLICATIONS: None immediate. PROCEDURE: Informed written consent was obtained from the patient after a thorough discussion of the procedural risks, benefits and alternatives. All questions were addressed. Maximal Sterile Barrier Technique was utilized including caps, mask, sterile gowns, sterile gloves, sterile drape, hand hygiene and skin antiseptic. A timeout was performed prior to the  initiation of the procedure. The patient was placed in prone position on the angiography table. The thoracic spine region was prepped and draped in a sterile fashion. Under fluoroscopy, the T9 vertebral body was delineated and the skin area was marked. The skin was infiltrated with a 1% Lidocaine approximately 4 cm lateral to the spinous process projection on the left. Using a 22-gauge spinal needle, the soft issue and the peripedicular space and periosteum were infiltrated with Bupivacaine 0.5%. A skin incision was made at the access site. Subsequently, an 11-gauge Kyphon trocar was inserted under fluoroscopic guidance until contact with the pedicle was obtained. The trocar was inserted under light hammer tapping into the pedicle until the posterior boundaries of the vertebral body was reached. The diamond mandrill was removed and one core biopsy wasobtained. Attempt to advance a 20 gauge in a 22 gauge spinal needle through the endplate into the disc space proved unsuccessful. The cannula was later removed. Then, under fluoroscopy guidance and using extra pedicular approach a 22 gauge x 20 cm spinal needle was advanced into the T8-9 intervertebral disc. Two fine-needle aspiration samples were obtained with small amount of blood tinged fluid obtained. The access sites were cleaned and covered with a sterile bandage. IMPRESSION: Successful fluoroscopy guided T9 core bone biopsy and T8-9 disc fine-needle aspiration. Core biopsy sample was sent for tissue exam and fine needle aspiration samples were sent for Gram stain and culture. Electronically Signed   By: Pedro Earls M.D.   On: 01/27/2022 15:18   IR Fluoro Guide Ndl Plmt / BX  Result Date: 01/27/2022 INDICATION: [name] is a 76 y.o. female with a past medical history significant for RA, DM, HTN who presented to the ED on 01/26/22 with worsening upper back pain that radiates to her chest X 3 day. Found to be tachycardic and febrile with  leukocystosis and elevated lactic acid. 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 She comes to our service today for a T9 vertebral body core bone biopsy with T8-9 disc aspiration. EXAM: Fluoroscopy guided T9 core  bone biopsy and T8-9 fine-needle aspiration MEDICATIONS: Ancef 1 g IV. ANESTHESIA/SEDATION: Moderate (conscious) sedation was employed during this procedure. A total of Versed 2 mg and Fentanyl 75 mcg was administered intravenously by the radiology nurse. Total intra-service moderate Sedation Time: 45 minutes. The patient's level of consciousness and vital signs were monitored continuously by radiology nursing throughout the procedure under my direct supervision. FLUOROSCOPY: Radiation Exposure Index (as provided by the fluoroscopic device): 2703 mGy Kerma COMPLICATIONS: None immediate. PROCEDURE: Informed written consent was obtained from the patient after a thorough discussion of the procedural risks, benefits and alternatives. All questions were addressed. Maximal Sterile Barrier Technique was utilized including caps, mask, sterile gowns, sterile gloves, sterile drape, hand hygiene and skin antiseptic. A timeout was performed prior to the initiation of the procedure. The patient was placed in prone position on the angiography table. The thoracic spine region was prepped and draped in a sterile fashion. Under fluoroscopy, the T9 vertebral body was delineated and the skin area was marked. The skin was infiltrated with a 1% Lidocaine approximately 4 cm lateral to the spinous process projection on the left. Using a 22-gauge spinal needle, the soft issue and the peripedicular space and periosteum were infiltrated with Bupivacaine 0.5%. A skin incision was made at the access site. Subsequently, an 11-gauge Kyphon trocar was inserted  under fluoroscopic guidance until contact with the pedicle was obtained. The trocar was inserted under light hammer tapping into the pedicle until the posterior boundaries of the vertebral body was reached. The diamond mandrill was removed and one core biopsy wasobtained. Attempt to advance a 20 gauge in a 22 gauge spinal needle through the endplate into the disc space proved unsuccessful. The cannula was later removed. Then, under fluoroscopy guidance and using extra pedicular approach a 22 gauge x 20 cm spinal needle was advanced into the T8-9 intervertebral disc. Two fine-needle aspiration samples were obtained with small amount of blood tinged fluid obtained. The access sites were cleaned and covered with a sterile bandage. IMPRESSION: Successful fluoroscopy guided T9 core bone biopsy and T8-9 disc fine-needle aspiration. Core biopsy sample was sent for tissue exam and fine needle aspiration samples were sent for Gram stain and culture. Electronically Signed   By: Pedro Earls M.D.   On: 01/27/2022 15:18   ECHOCARDIOGRAM COMPLETE  Result Date: 01/27/2022    ECHOCARDIOGRAM REPORT   Patient Name:   SANTRESA LEVETT Date of Exam: 01/27/2022 Medical Rec #:  [id number]      Height:       56.0 in Accession #:    [id number]     Weight:       148.0 lb Date of Birth:  [birth year]      BSA:          1.562 m Patient Age:    62 years       BP:           140/85 mmHg Patient Gender: F              HR:           127 bpm. Exam Location:  Inpatient Procedure: 2D Echo Indications:    Fever  History:        Patient has no prior history of Echocardiogram examinations.                 Signs/Symptoms:Shortness of Breath; Risk Factors:Dyslipidemia,  Hypertension and Diabetes.  Sonographer:    Arlyss Gandy Referring Phys: [id number] [facility]  Sonographer Comments: Technically difficult study due to poor echo windows. IMPRESSIONS  1. Left ventricular ejection fraction, by estimation, is 60 to 65%. Left  ventricular ejection fraction by PLAX is 63 %. The left ventricle has normal function. The left ventricle has no regional wall motion abnormalities. There is moderate asymmetric left ventricular hypertrophy of the basal-septal segment. Left ventricular diastolic parameters are consistent with Grade I diastolic dysfunction (impaired relaxation). Elevated left ventricular end-diastolic pressure. The E/e' is 58.  2. Right ventricular systolic function is normal. The right ventricular size is normal. There is normal pulmonary artery systolic pressure. The estimated right ventricular systolic pressure is 10.0 mmHg.  3. The mitral valve is grossly normal. Trivial mitral valve regurgitation.  4. The aortic valve is tricuspid. Aortic valve regurgitation is not visualized.  5. The inferior vena cava is normal in size with greater than 50% respiratory variability, suggesting right atrial pressure of 3 mmHg. Comparison(s): No prior Echocardiogram. FINDINGS  Left Ventricle: Left ventricular ejection fraction, by estimation, is 60 to 65%. Left ventricular ejection fraction by PLAX is 63 %. The left ventricle has normal function. The left ventricle has no regional wall motion abnormalities. The left ventricular internal cavity size was normal in size. There is moderate asymmetric left ventricular hypertrophy of the basal-septal segment. Left ventricular diastolic parameters are consistent with Grade I diastolic dysfunction (impaired relaxation). Elevated left ventricular end-diastolic pressure. The E/e' is 16. Right Ventricle: The right ventricular size is normal. No increase in right ventricular wall thickness. Right ventricular systolic function is normal. There is normal pulmonary artery systolic pressure. The tricuspid regurgitant velocity is 2.49 m/s, and  with an assumed right atrial pressure of 3 mmHg, the estimated right ventricular systolic pressure is 71.2 mmHg. Left Atrium: Left atrial size was normal in size. Right  Atrium: Right atrial size was normal in size. Pericardium: There is no evidence of pericardial effusion. Mitral Valve: The mitral valve is grossly normal. Trivial mitral valve regurgitation. MV peak gradient, 9.9 mmHg. The mean mitral valve gradient is 4.0 mmHg. Tricuspid Valve: The tricuspid valve is grossly normal. Tricuspid valve regurgitation is trivial. Aortic Valve: The aortic valve is tricuspid. Aortic valve regurgitation is not visualized. Aortic valve mean gradient measures 5.0 mmHg. Aortic valve peak gradient measures 10.9 mmHg. Aortic valve area, by VTI measures 2.59 cm. Pulmonic Valve: The pulmonic valve was normal in structure. Pulmonic valve regurgitation is not visualized. Aorta: The aortic root and ascending aorta are structurally normal, with no evidence of dilitation. Venous: The inferior vena cava is normal in size with greater than 50% respiratory variability, suggesting right atrial pressure of 3 mmHg. IAS/Shunts: No atrial level shunt detected by color flow Doppler.  LEFT VENTRICLE PLAX 2D LV EF:         Left            Diastology                ventricular     LV e' medial:    4.90 cm/s                ejection        LV E/e' medial:  15.2                fraction by     LV e' lateral:   4.35 cm/s  PLAX is 63      LV E/e' lateral: 17.2                %. LVIDd:         3.30 cm LVIDs:         2.20 cm LV PW:         1.10 cm LV IVS:        1.40 cm LVOT diam:     1.90 cm LV SV:         60 LV SV Index:   38 LVOT Area:     2.84 cm  RIGHT VENTRICLE             IVC RV Basal diam:  3.20 cm     IVC diam: 1.80 cm RV Mid diam:    3.20 cm RV S prime:     16.60 cm/s TAPSE (M-mode): 1.8 cm LEFT ATRIUM             Index        RIGHT ATRIUM           Index LA diam:        2.90 cm 1.86 cm/m   RA Area:     11.30 cm LA Vol (A2C):   44.3 ml 28.36 ml/m  RA Volume:   21.20 ml  13.57 ml/m LA Vol (A4C):   39.4 ml 25.23 ml/m LA Biplane Vol: 43.1 ml 27.59 ml/m  AORTIC VALVE AV Area (Vmax):    2.35 cm  AV Area (Vmean):   2.33 cm AV Area (VTI):     2.59 cm AV Vmax:           165.00 cm/s AV Vmean:          106.000 cm/s AV VTI:            0.232 m AV Peak Grad:      10.9 mmHg AV Mean Grad:      5.0 mmHg LVOT Vmax:         137.00 cm/s LVOT Vmean:        87.100 cm/s LVOT VTI:          0.212 m LVOT/AV VTI ratio: 0.91  AORTA Ao Root diam: 2.70 cm Ao Asc diam:  3.10 cm MITRAL VALVE                TRICUSPID VALVE MV Area (PHT): 3.30 cm     TR Peak grad:   24.8 mmHg MV Area VTI:   2.62 cm     TR Vmax:        249.00 cm/s MV Peak grad:  9.9 mmHg MV Mean grad:  4.0 mmHg     SHUNTS MV Vmax:       1.57 m/s     Systemic VTI:  0.21 m MV Vmean:      94.0 cm/s    Systemic Diam: 1.90 cm MV Decel Time: 230 msec MV E velocity: 74.70 cm/s MV A velocity: 130.00 cm/s MV E/A ratio:  0.57 Lyman Bishop MD Electronically signed by Lyman Bishop MD Signature Date/Time: 01/27/2022/5:24:42 PM    Final    Scheduled Meds:  bethanechol  10 mg Oral TID   diltiazem  180 mg Oral Daily   enalapril  20 mg Oral BID   fluticasone furoate-vilanterol  1 puff Inhalation Daily   And   umeclidinium bromide  1 puff Inhalation Daily   folic acid  0.5 mg Oral Daily   gabapentin  300 mg Oral QHS   insulin aspart  0-9 Units Subcutaneous TID WC   pravastatin  40 mg Oral q1800   Continuous Infusions:  cefTRIAXone (ROCEPHIN)  IV 2 g (01/28/22 1818)   vancomycin 500 mg (01/28/22 2028)     LOS: 3 days    Time spent: 50 minutes   Omarius Grantham Dwyane Dee,  MD [facility] Available via Epic secure chat 7am-7pm After these hours, please refer to coverage provider listed on [email or web address] 01/29/2022, 1:41 PM

## 2022-01-29 NOTE — Progress Notes (Signed)
Physical Therapy Treatment ?Patient Details ?Name: [name] ?MRN: [id number] ?DOB: Apr 21, 1946 ?Today's Date: 01/29/2022 ? ? ?History of Present Illness 76 y/o female presented to ED on 01/26/22 for complaints of severe back pain. Went to urgent care on 3/5 but no cause was found on imaging. 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 PMH: RA, DM, HTN, hx of ACDF 2017, hx of back sx 2019 ? ?  ?PT Comments  ? ? Pt tolerates treatment well despite continued back pain. Pt is able to increase ambulation distances and tolerates multiple bouts of mobility. Pt will benefit from continued frequent mobilization to aide in a return to independence.   ?Recommendations for follow up therapy are one component of a multi-disciplinary discharge planning process, led by the attending physician.  Recommendations may be updated based on patient status, additional functional criteria and insurance authorization. ? ?Follow Up Recommendations ? Home health PT ?  ?  ?Assistance Recommended at Discharge Intermittent Supervision/Assistance  ?Patient can return home with the following A little help with walking and/or transfers;A little help with bathing/dressing/bathroom;Assistance with cooking/housework;Help with stairs or ramp for entrance;Assist for transportation ?  ?Equipment Recommendations ? Other (comment) (pediatric rolling walker)  ?  ?Recommendations for Other Services   ? ? ?  ?Precautions / Restrictions Precautions ?Precautions: Back ?Precaution Booklet Issued: No ?Precaution Comments: educated on log roll for comfort ?Restrictions ?Weight Bearing Restrictions: No  ?  ? ?Mobility ? Bed Mobility ?Overal bed mobility: Needs Assistance ?Bed Mobility: Rolling, Sidelying to Sit ?Rolling: Min guard ?Sidelying to sit: Min guard ?  ?  ?  ?  ?  ? ?Transfers ?Overall  transfer level: Needs assistance ?Equipment used: Rolling walker (2 wheels) ?Transfers: Sit to/from Stand ?Sit to Stand: Min guard ?  ?  ?  ?  ?  ?  ?  ? ?Ambulation/Gait ?Ambulation/Gait assistance: Min guard ?Gait Distance (Feet): 20 Feet (20' x 2 trials) ?Assistive device: Rolling walker (2 wheels) ?Gait Pattern/deviations: Step-through pattern ?Gait velocity: decreased ?Gait velocity interpretation: <1.8 ft/sec, indicate of risk for recurrent falls ?  ?General Gait Details: pt with slowed step-through gait, reduced stride length ? ? ?Stairs ?  ?  ?  ?  ?  ? ? ?Wheelchair Mobility ?  ? ?Modified Rankin (Stroke Patients Only) ?  ? ? ?  ?Balance Overall balance assessment: Needs assistance ?Sitting-balance support: No upper extremity supported, Feet supported ?Sitting balance-Leahy Scale: Fair ?  ?  ?Standing balance support: Bilateral upper extremity supported, Reliant on assistive device for balance ?Standing balance-Leahy Scale: Poor ?  ?  ?  ?  ?  ?  ?  ?  ?  ?  ?  ?  ?  ? ?  ?Cognition Arousal/Alertness: Awake/alert ?Behavior During Therapy: [facility] for tasks assessed/performed ?Overall Cognitive Status: Within Functional Limits for tasks assessed ?  ?  ?  ?  ?  ?  ?  ?  ?  ?  ?  ?  ?  ?  ?  ?  ?  ?  ?  ? ?  ?Exercises   ? ?  ?General Comments General comments (skin integrity, edema, etc.): VSS on RA ?  ?  ? ?Pertinent Vitals/Pain Pain Assessment ?Pain Assessment: Faces ?Faces Pain Scale: Hurts even more ?Pain Location: back ?Pain Descriptors / Indicators: Grimacing ?Pain Intervention(s): Monitored during session  ? ? ?Home Living   ?  ?  ?  ?  ?  ?  ?  ?  ?  ?   ?  ?  Prior Function    ?  ?  ?   ? ?PT Goals (current goals can now be found in the care plan section) Acute Rehab PT Goals ?Patient Stated Goal: to reduce pain ?Progress towards PT goals: Progressing toward goals ? ?  ?Frequency ? ? ? Min 4X/week ? ? ? ?  ?PT Plan Current plan remains appropriate  ? ? ?Co-evaluation   ?  ?  ?  ?  ? ?  ?AM-PAC PT "6  Clicks" Mobility   ?Outcome Measure ? Help needed turning from your back to your side while in a flat bed without using bedrails?: A Little ?Help needed moving from lying on your back to sitting on the side of a flat bed without using bedrails?: A Little ?Help needed moving to and from a bed to a chair (including a wheelchair)?: A Little ?Help needed standing up from a chair using your arms (e.g., wheelchair or bedside chair)?: A Little ?Help needed to walk in hospital room?: A Little ?Help needed climbing 3-5 steps with a railing? : A Little ?6 Click Score: 18 ? ?  ?End of Session   ?Activity Tolerance: Patient tolerated treatment well ?Patient left: in chair;with call bell/phone within reach;with family/visitor present ?Nurse Communication: Mobility status ?PT Visit Diagnosis: Unsteadiness on feet (R26.81);Muscle weakness (generalized) (M62.81);Pain ?Pain - part of body:  (back) ?  ? ? ?Time: 1001-1030 ?PT Time Calculation (min) (ACUTE ONLY): 29 min ? ?Charges:  $Gait Training: 23-37 mins          ?          ? ?Zenaida Niece, PT, DPT ?Acute Rehabilitation ?Pager: [phone] ?Office [phone] ? ? ? ?Zenaida Niece ?01/29/2022, 10:45 AM ? ?

## 2022-01-29 NOTE — TOC Progression Note (Addendum)
Transition of Care (TOC) - Progression Note  ? ? ?Patient Details  ?Name: [name] ?MRN: [id number] ?Date of Birth: 1946/09/27 ? ?Transition of Care (TOC) CM/SW Contact  ?Angelita Ingles, RN ?Phone [phone] ? ?01/29/2022, 3:12 PM ? ?Clinical Narrative:    ?TOC folowing fpr patient expected to d/c home on Home IV antibiotics and HH PT/OT. Cm at bedside to offer choice. Patient has previously been active with [facility] and would like to resume services with them. Referral has been sent to [facility] awaiting acceptance.  ? ?[facility] unable to accept [facility] referral.  ? ? ?  ?  ? ?Expected Discharge Plan and Services ?  ?  ?  ?  ?  ?                ?  ?  ?  ?  ?  ?  ?  ?  ?  ?  ? ? ?Social Determinants of Health (SDOH) Interventions ?  ? ?Readmission Risk Interventions ?No flowsheet data found. ? ?

## 2022-01-30 ENCOUNTER — Inpatient Hospital Stay: Payer: Self-pay

## 2022-01-30 LAB — GLUCOSE, CAPILLARY
Glucose-Capillary: 107 mg/dL — ABNORMAL HIGH (ref 70–99)
Glucose-Capillary: 149 mg/dL — ABNORMAL HIGH (ref 70–99)
Glucose-Capillary: 142 mg/dL — ABNORMAL HIGH (ref 70–99)
Glucose-Capillary: 142 mg/dL — ABNORMAL HIGH (ref 70–99)

## 2022-01-30 MED ORDER — ENOXAPARIN SODIUM 40 MG/0.4ML IJ SOSY
40.0000 mg | PREFILLED_SYRINGE | INTRAMUSCULAR | Status: DC
  Administered 2022-01-30 – 2022-01-31 (×2): 40 mg via SUBCUTANEOUS
  Filled 2022-01-30 (×2): qty 0.4

## 2022-01-30 MED ORDER — CEFAZOLIN IV (FOR PTA / DISCHARGE USE ONLY): 2.0000 g | Freq: Three times a day (TID) | INTRAVENOUS | 0 refills | Status: DC

## 2022-01-30 MED ORDER — PANTOPRAZOLE SODIUM 40 MG PO TBEC
40.0000 mg | DELAYED_RELEASE_TABLET | Freq: Every day | ORAL | Status: DC | PRN
  Filled 2022-01-30: qty 1

## 2022-01-30 MED ORDER — CEFAZOLIN IV (FOR PTA / DISCHARGE USE ONLY): 2.0000 g | Freq: Three times a day (TID) | INTRAVENOUS | 0 refills | Status: AC

## 2022-01-30 MED ORDER — CEFAZOLIN SODIUM-DEXTROSE 2-4 GM/100ML-% IV SOLN
2.0000 g | Freq: Three times a day (TID) | INTRAVENOUS | Status: DC
  Administered 2022-01-30 – 2022-01-31 (×5): 2 g via INTRAVENOUS
  Filled 2022-01-30 (×5): qty 100

## 2022-01-30 NOTE — Progress Notes (Signed)
PROGRESS NOTE    [name]  NUU:[id number] DOB: [birth year] DOA: 01/26/2022  PCP: Merrilee Seashore, MD    Brief Narrative:  [name] is a 76 year old female with PMH significant for rheumatoid arthritis, type 2 diabetes mellitus, essential hypertension, osteoarthritis s/p bilateral hip replacements, history of ACDF 2017 and lumbar laminectomy/decompression with microdiscectomy 2019, history of previous MRSA infection requiring 6-week outpatient antibiotic course who presented to [facility] ED on 3/6 for back pain that has been progressing over the last 3 days.  Patient reports onset 3 days ago with pain in upper back with radiation to the chest.  Denies any incontinence of urine, bowel; no lower extremity weakness and no paresthesias.  In the ED, temperature 102.2 F, HR 127, RR 25, BP 156/90, SPO2 94% on room air.  WBC 25.8, hemoglobin 13.0, platelet count 256.  Sodium 137, potassium 3.0, chloride 100, CO2 25, glucose 116, BUN 19, creatinine 0.94.  Lipase 26, AST 20, ALT 21, total bilirubin 1.0.  Lactic acid 2.7.  [name] PCR negative.  Influenza A/B PCR negative.  INR 1.1.  Urinalysis unrevealing.  Chest x-ray with no acute cardiopulmonary disease process.  MR L/T-spine with and without contrast with extensive epidural enhancement thoracic canal compatible with epidural phlegmon, sclerotic changes T8, T9, T10 with mild edema suspicious for discitis/osteomyelitis, mild/moderate lumbar stenosis and moderate to severe foraminal stenosis thoracic spine.  EDP consulted neurosurgery, Dr. Christella Noa who reviewed imaging studies and recommended IR for needle biopsy of T8 or T9 and to continue antibiotic treatment.  TRH consulted for further evaluation and management of osteomyelitis/discitis with concern of epidural phlegmon.    Assessment and Plan: * Sepsis ([place]) discitis/osteomyelitis, epidural phlegmon Patient presented with progressive back pain. She was septic on arrival. MRI L/T-spine with and  without contrast with extensive epidural enhancement, thoracic canal compatible with epidural phlegmon, sclerotic changes T8, T9, T10 with mild edema suspicious for discitis/osteomyelitis, mild/moderate lumbar stenosis and moderate to severe foraminal stenosis thoracic spine.  Likely complicated by history of immunosuppression with history of rheumatoid arthritis on immunosuppression with Enbrel/methotrexate.  Evaluated by neurosurgery who recommended nonoperative management with IR consultation for biopsy / aspiration and antibiotic therapy TTE with LVEF 60 to 34%, grade 1 diastolic dysfunction, no LV regional wall motion abnormalities, trivial MR, IVC normal.   She underwent T9 core bone biopsy and T8-9 disc aspiration by IR on 3/7.   Lactic acidosis now resolved. Neurosurgery and infectious disease following, appreciate assistance WBC 27.5>19.5>20.5 Hold Enbrel/methotrexate. Blood cultures x 2: No growth x 3 days T8-9 culture, no organisms/WBC seen, further pending AFB and acid-fast culture: Pending Continued vancomycin until 3/10.  Antibiotic changed to Ancef since cultures growing MSSA Needs 6 to 8 weeks of IV antibiotics from 01/27/2022. HHS arranged.   HTN (hypertension) Continue Cardizem enalapril, hydralazine as needed., Holding Bumex.  Diabetes ([place]) Most recent Hb A1c in EMR 5.3 in 2019. --SSI for coverage. Hb A1c 5.3   Centrilobular emphysema (HCC) On Trelegy Ellipta as outpatient. Continue Incruse Ellipta Breo Ellipta as hospital substitutions. Albuterol neb every 4 hours as needed shortness of breath/wheezing  Rheumatoid arthritis ([place]) Patient is on methotrexate and Enbrel outpatient. Hold immunosuppressive medications in the setting of sepsis as above.  Hyperlipidemia Continue pravastatin.  Hypokalemia Replaced and resolved.  Acute urinary retention Overnight, patient required to in and out catheterizations for acute urinary retention.   Likely complicated by  her poor mobility with epidural abscess/discitis, osteomyelitis. Continue Foley catheter.  Consider voiding trial 3/11  Epidural abscess  Underwent IR bone biopsy/aspiration as above.   Neurosurgery following, recommend non operative management with antibiotics.   ID recommended total 8 weeks of IV antibiotics. Continued vancomycin and  Ceftriaxone. Antibiotic changed to Ancef since cultures growing MSSA. Needs 6 to 8 weeks of IV antibiotics from 01/27/2022  Discitis -- Treatment as above    DVT prophylaxis: enoxaparin (LOVENOX) injection 40 mg Start: 01/30/22 1115 SCDs Start: 01/26/22 2326    Code Status: Full Code Family Communication: Daughter at bed side.  Disposition Plan:  Level of care: Progressive Status is: Inpatient Remains inpatient appropriate because: Admitted for epidural phlegmon,  underwent IR guided biopsy,  aspiration of discitis now requiring IV antibiotics.  Anticipated discharge 01/31/2022.  Consultants:  Neurosurgery, Dr. Christella Noa, Dr. Arnoldo Morale Infectious disease, Dr. Montel Culver  Procedures:  None  Antimicrobials:  Vancomycin 3/6>> Ceftriaxone 3/6>> Cefepime 3/6 - 3/6 Metronidazole 3/6 - 3/6   Subjective: Patient was seen and examined at bedside.  Overnight events noted. Patient reports having back pain which is radiating towards the abdomen. She denies any nausea and vomiting, reports overall feeling better.  Objective: Vitals:   01/30/22 0426 01/30/22 0716 01/30/22 0921 01/30/22 1133  BP: (!) 161/93 (!) 173/89    Pulse: 94 (!) 102 (!) 101   Resp: '14 20 20   '$ Temp: 98.5 F (36.9 C) 99 F (37.2 C)  98.4 F (36.9 C)  TempSrc: Oral Oral  Oral  SpO2:  91% 93% 92%  Weight:      Height:        Intake/Output Summary (Last 24 hours) at 01/30/2022 1308 Last data filed at 01/30/2022 0428 Gross per 24 hour  Intake --  Output 650 ml  Net -650 ml   Filed Weights   01/26/22 1243  Weight: 67.1 kg    Examination:  Physical Exam: GEN:  Appears comfortable, not in any acute distress, deconditioned. HEENT: NCAT, PERRL, EOMI, sclera clear, MMM PULM: CTA bilaterally, no wheezing, no crackles, respiratory effort normal. CV: S1-S2 heard, regular rate and rhythm, no murmur. GI: Abdomen is soft, non tender, non distended, BS+ MSK: no peripheral edema, muscle strength globally intact 5/5 bilateral upper/lower extremities NEURO: CN II-XII intact, no focal deficits, sensation to light touch intact PSYCH: normal mood/affect Skin: dry/intact, no rashes or wounds    Data Reviewed: I have personally reviewed following labs and imaging studies  CBC: Recent Labs  Lab 01/26/22 1406 01/27/22 0408 01/28/22 0651 01/29/22 0550 01/29/22 2253  WBC 25.8* 25.7* 14.8* 19.5* 20.7*  NEUTROABS 23.0*  --   --   --   --   HGB 13.0 12.5 11.9* 11.6* 12.0  HCT 40.5 39.0 35.7* 35.0* 36.2  MCV 84.7 86.9 83.2 81.6 81.2  PLT 256 244 206 216 130   Basic Metabolic Panel: Recent Labs  Lab 01/26/22 1406 01/27/22 0408 01/28/22 0651 01/29/22 0550 01/29/22 2253  NA 137 135 135 137 135  K 3.0* 3.9 3.4* 3.6 3.8  CL 100 101 101 101 98  CO2 25 17* '24 26 24  '$ GLUCOSE 116* 91 80 104* 109*  BUN '19 19 13 8 '$ 7*  CREATININE 0.94 0.87 0.70 0.70 0.56  CALCIUM 9.0 8.9 8.8* 8.7* 8.7*  MG  --   --   --  1.8  --    GFR: Estimated Creatinine Clearance: 48.1 mL/min (by C-G formula based on SCr of 0.56 mg/dL). Liver Function Tests: Recent Labs  Lab 01/25/22 1700 01/26/22 1406 01/27/22 0408  AST 32 20 24  ALT 24 21 19  ALKPHOS 81 84 94  BILITOT 1.0 1.0 0.8  PROT 7.3 7.1 7.0  ALBUMIN 4.0 3.6 3.3*   Recent Labs  Lab 01/25/22 1700 01/26/22 1406  LIPASE 30 26   No results for input(s): AMMONIA in the last 168 hours. Coagulation Profile: Recent Labs  Lab 01/26/22 1406  INR 1.1   Cardiac Enzymes: No results for input(s): CKTOTAL, CKMB, CKMBINDEX, TROPONINI in the last 168 hours. BNP (last 3 results) No results for input(s): PROBNP in the last  8760 hours. HbA1C: Recent Labs    01/27/22 1810  HGBA1C 5.4   CBG: Recent Labs  Lab 01/29/22 1115 01/29/22 1612 01/29/22 2115 01/30/22 0708 01/30/22 1130  GLUCAP 120* 124* 115* 107* 149*   Lipid Profile: No results for input(s): CHOL, HDL, LDLCALC, TRIG, CHOLHDL, LDLDIRECT in the last 72 hours. Thyroid Function Tests: No results for input(s): TSH, T4TOTAL, FREET4, T3FREE, THYROIDAB in the last 72 hours. Anemia Panel: No results for input(s): VITAMINB12, FOLATE, FERRITIN, TIBC, IRON, RETICCTPCT in the last 72 hours. Sepsis Labs: Recent Labs  Lab 01/26/22 1406 01/27/22 0808 01/27/22 1810  LATICACIDVEN 2.7* 1.0 1.2    Recent Results (from the past 240 hour(s))  Resp Panel by RT-PCR (Flu A&B, Covid) Nasopharyngeal Swab     Status: None   Collection Time: 01/26/22  2:06 PM   Specimen: Nasopharyngeal Swab; Nasopharyngeal(NP) swabs in vial transport medium  Result Value Ref Range Status   SARS Coronavirus 2 by RT PCR NEGATIVE NEGATIVE Final    Comment: (NOTE) SARS-CoV-2 target nucleic acids are NOT DETECTED.  The SARS-CoV-2 RNA is generally detectable in upper respiratory specimens during the acute phase of infection. The lowest concentration of SARS-CoV-2 viral copies this assay can detect is 138 copies/mL. A negative result does not preclude SARS-Cov-2 infection and should not be used as the sole basis for treatment or other patient management decisions. A negative result may occur with  improper specimen collection/handling, submission of specimen other than nasopharyngeal swab, presence of viral mutation(s) within the areas targeted by this assay, and inadequate number of viral copies(<138 copies/mL). A negative result must be combined with clinical observations, patient history, and epidemiological information. The expected result is Negative.  Fact Sheet for Patients:  EntrepreneurPulse.com.au  Fact Sheet for Healthcare Providers:   IncredibleEmployment.be  This test is no t yet approved or cleared by the [place] FDA and  has been authorized for detection and/or diagnosis of SARS-CoV-2 by FDA under an Emergency Use Authorization (EUA). This EUA will remain  in effect (meaning this test can be used) for the duration of the COVID-19 declaration under Section 564(b)(1) of the Act, 21 U.S.C.section 360bbb-3(b)(1), unless the authorization is terminated  or revoked sooner.       Influenza A by PCR NEGATIVE NEGATIVE Final   Influenza B by PCR NEGATIVE NEGATIVE Final    Comment: (NOTE) The Xpert Xpress SARS-CoV-2/FLU/RSV plus assay is intended as an aid in the diagnosis of influenza from Nasopharyngeal swab specimens and should not be used as a sole basis for treatment. Nasal washings and aspirates are unacceptable for Xpert Xpress SARS-CoV-2/FLU/RSV testing.  Fact Sheet for Patients: EntrepreneurPulse.com.au  Fact Sheet for Healthcare Providers: IncredibleEmployment.be  This test is not yet approved or cleared by the [place] FDA and has been authorized for detection and/or diagnosis of SARS-CoV-2 by FDA under an Emergency Use Authorization (EUA). This EUA will remain in effect (meaning this test can be used) for the duration of the COVID-19 declaration under Section 564(b)(1)  of the Act, 21 U.S.C. section 360bbb-3(b)(1), unless the authorization is terminated or revoked.  Performed at [facility], [place] [address], [place], [place] [address]   Blood Culture (routine x 2)     Status: None (Preliminary result)   Collection Time: 01/26/22  9:51 PM   Specimen: BLOOD RIGHT HAND  Result Value Ref Range Status   Specimen Description BLOOD RIGHT HAND  Final   Special Requests   Final    BOTTLES DRAWN AEROBIC AND ANAEROBIC Blood Culture adequate volume   Culture   Final    NO GROWTH 3 DAYS Performed at [facility], [place] [address], [place], [place] [address]    Report Status PENDING  Incomplete  Urine Culture     Status: None   Collection Time: 01/27/22  1:47 AM   Specimen: Urine, Random  Result Value Ref Range Status   Specimen Description URINE, RANDOM  Final   Special Requests NONE  Final   Culture   Final    NO GROWTH Performed at [facility], [address], [place], [place] [address]    Report Status 01/28/2022 FINAL  Final  Aerobic/Anaerobic Culture w Gram Stain (surgical/deep wound)     Status: None (Preliminary result)   Collection Time: 01/27/22  2:03 PM   Specimen: Wound; Tissue  Result Value Ref Range Status   Specimen Description WOUND  Final   Special Requests INTERVERTEBRAL DISC T8/T9  Final   Gram Stain NO WBC SEEN NO ORGANISMS SEEN   Final   Culture   Final    FEW STAPHYLOCOCCUS AUREUS NO ANAEROBES ISOLATED; CULTURE IN PROGRESS FOR 5 DAYS    Report Status PENDING  Incomplete   Organism ID, Bacteria STAPHYLOCOCCUS AUREUS  Final      Susceptibility   Staphylococcus aureus - MIC*    CIPROFLOXACIN 1 SENSITIVE Sensitive     ERYTHROMYCIN >=8 RESISTANT Resistant     GENTAMICIN <=0.5 SENSITIVE Sensitive     OXACILLIN 0.5 SENSITIVE Sensitive     TETRACYCLINE <=1 SENSITIVE Sensitive     VANCOMYCIN <=0.5 SENSITIVE Sensitive     TRIMETH/SULFA <=10 SENSITIVE Sensitive     CLINDAMYCIN RESISTANT Resistant     RIFAMPIN <=0.5 SENSITIVE Sensitive     Inducible Clindamycin Value in next row Resistant      POSITIVEPerformed at [place] [address], [place], [place] [address]    * FEW STAPHYLOCOCCUS AUREUS  Culture, fungus without smear     Status: None (Preliminary result)   Collection Time: 01/27/22  2:03 PM   Specimen: Wound; Other  Result Value Ref Range Status   Specimen Description WOUND  Final   Special Requests INTERVERTEBRAL DISC T8/T9  Final   Culture   Final    NO FUNGUS ISOLATED AFTER 3 DAYS Performed at [facility], [address] [place], [place]  [address]    Report Status PENDING  Incomplete  KOH prep     Status: None   Collection Time: 01/27/22  2:03 PM   Specimen: Wound  Result Value Ref Range Status   Specimen Description   Final    WOUND Performed at [facility], [place] [address] [place], [place] [address]    Special Requests   Final    INTERVERTEBRAL DISC T8/T9 Performed at [facility], [place] [address], [place], [place] [address]    KOH Prep   Final    NO YEAST OR FUNGAL  ELEMENTS SEEN Performed at [facility], [address] [place], [place] [address]    Report Status 01/28/2022 FINAL  Final  Acid Fast Smear (AFB)     Status: None   Collection Time: 01/27/22  2:03 PM   Specimen: Tissue  Result Value Ref Range Status   AFB Specimen Processing Concentration  Final   Acid Fast Smear Negative  Final    Comment: (NOTE) Performed At: [facility] [place], [place] [id number] Rush Farmer MD ZP:[id number]    Source (AFB) WOUND  Final    Comment: INTERVERTEBRAL DISC T8/T9 Performed at [facility], [place] [address], [place], [place] [address]    Radiology Studies: No results found. Scheduled Meds:  bethanechol  10 mg Oral TID   diltiazem  180 mg Oral Daily   enalapril  20 mg Oral BID   enoxaparin (LOVENOX) injection  40 mg Subcutaneous Q24H   fluticasone furoate-vilanterol  1 puff Inhalation Daily   And   umeclidinium bromide  1 puff Inhalation Daily   folic acid  0.5 mg Oral Daily   gabapentin  300 mg Oral QHS   insulin aspart  0-9 Units Subcutaneous TID WC   polyethylene glycol  17 g Oral Daily   pravastatin  40 mg Oral q1800   Continuous Infusions:   ceFAZolin (ANCEF) IV 2 g (01/30/22 0911)     LOS: 4 days    Time spent: 35 minutes   Elliannah Wayment,  MD [facility] Available via Epic secure chat 7am-7pm After these hours, please refer to coverage provider listed on [email or web address] 01/30/2022, 1:08 PM

## 2022-01-30 NOTE — Progress Notes (Signed)
?  ID Brief Note  ? ?Disc aspirate cx 01/27/22 MSSA ?Will switch Vancomycin to cefazolin  ?OPAT orders placed ? ?Diagnosis: discitis  ? ?Culture Result: MSSA ? ?Allergies  ?Allergen Reactions  ? Ivp Dye [Iodinated Contrast Media] Nausea And Vomiting  ? Prednisone Other (See Comments)  ?  reflux ?Other reaction(s): acid reflux ?Other reaction(s): acid reflux ?Other reaction(s): acid reflux  ? ? ?OPAT Orders ?Discharge antibiotics to be given via PICC line ?Discharge antibiotics: cefazolin  ?Per pharmacy protocol  ?Aim for Vancomycin trough 15-20 or AUC 400-550 (unless otherwise indicated) ?Duration: 8 weeks  ?End Date: 03/24/22  ?[facility] Per Protocol: ? ?Home health RN for IV administration and teaching; PICC line care and labs.   ? ?Labs weekly while on IV antibiotics: ?X__ CBC with differential ?X__ BMP ?__ CMP ?__ CRP and ESR every 2 weeks ?__ Vancomycin trough ?__ CK ? ?X__ Please pull PIC at completion of IV antibiotics ?__ Please leave PIC in place until doctor has seen patient or been notified ? ?Fax weekly labs to [phone] ? ?Clinic Follow Up Appt: 3/31 at 11: 15 am  ? ?Rosiland Oz, MD ?Infectious Disease Physician ?[facility] ?[address] ?[place], [place] [address] ?Phone: [phone]  Fax: [phone]' ? ?

## 2022-01-30 NOTE — Progress Notes (Signed)
PT Cancellation Note ? ?Patient Details ?Name: [name] ?MRN: [id number] ?DOB: 08/01/1946 ? ? ?Cancelled Treatment:    Reason Eval/Treat Not Completed: Fatigue/lethargy limiting ability to participate. Per discussion with OT the pt is requesting PT hold until tomorrow due to exhaustion from mobilizing with OT and spending prolonged time up in the recliner. PT will attempt to follow up tomorrow. ? ? ?Zenaida Niece ?01/30/2022, 2:50 PM ?

## 2022-01-30 NOTE — Progress Notes (Signed)
Occupational Therapy Treatment ?Patient Details ?Name: [name] ?MRN: [id number] ?DOB: 07/24/1946 ?Today's Date: 01/30/2022 ? ? ?History of present illness 76 y/o female presented to ED on 01/26/22 for complaints of severe back pain. Went to urgent care on 3/5 but no cause was found on imaging. 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 PMH: RA, DM, HTN, hx of ACDF 2017, hx of back sx 2019 ?  ?OT comments ? This 76 yo female seen today to have her work with AE for LBD--she was able to use reacher and sock aid with S while seated EOB. Limited today due to she had been up since ~7:30 and was tired and in pain. We will continue to follow.  ? ?Recommendations for follow up therapy are one component of a multi-disciplinary discharge planning process, led by the attending physician.  Recommendations may be updated based on patient status, additional functional criteria and insurance authorization. ?   ?Follow Up Recommendations ? Home health OT  ?  ?Assistance Recommended at Discharge Frequent or constant Supervision/Assistance  ?Patient can return home with the following ? A little help with walking and/or transfers;A little help with bathing/dressing/bathroom;Assistance with cooking/housework;Assist for transportation ?  ?Equipment Recommendations ? BSC/3in1  ?  ?   ?Precautions / Restrictions Precautions ?Precautions: Back ?Precaution Booklet Issued: No ?Precaution Comments: educated on log roll for comfort ?Restrictions ?Weight Bearing Restrictions: No  ? ? ?  ? ?Mobility Bed Mobility ?Overal bed mobility: Needs Assistance ?Bed Mobility: Rolling, Sidelying to Sit, Sit to Sidelying ?Rolling: Supervision ?Sidelying to sit: Min guard ?  ?  ?Sit to sidelying: Min assist ?General bed mobility comments: A for legs back onto bed ?   ? ?Transfers ?Overall transfer level: Needs assistance ?Equipment used: Rolling walker (2 wheels) ?Transfers: Sit to/from Stand ?Sit to Stand: Min guard ?  ?  ?  ?  ?  ?General transfer comment: pt min guard A to take side steps up towards the [facility] ?  ?  ?Balance Overall balance assessment: Needs assistance ?Sitting-balance support: No upper extremity supported, Feet supported ?Sitting balance-Leahy Scale: Fair ?  ?  ?Standing balance support: Bilateral upper extremity supported, Reliant on assistive device for balance ?Standing balance-Leahy Scale: Poor ?  ?  ?  ?  ?  ?  ?  ?  ?  ?  ?  ?  ?   ? ?ADL either performed or assessed with clinical judgement  ? ?ADL Overall ADL's : Needs assistance/impaired ?  ?  ?  ?  ?  ?  ?  ?  ?  ?  ?Lower Body Dressing: Set up;Supervision/safety;With adaptive equipment ?Lower Body Dressing Details (indicate cue type and reason): min guard A sit<>stand; reacher to doff socks and sock aid to donn socks; reports she is aware how to use reacher to donn/doff underwear and socks ?  ?  ?  ?  ?  ?  ?  ?  ?  ? ?Extremity/Trunk Assessment Upper Extremity Assessment ?Upper Extremity Assessment: Overall WFL for tasks assessed ?  ?  ?  ?  ?  ? ?Vision Baseline Vision/History: 1 Wears glasses ?  ?  ?   ?   ? ?Cognition Arousal/Alertness: Awake/alert ?Behavior During Therapy: [facility] for tasks assessed/performed ?Overall Cognitive Status: Within Functional Limits for tasks assessed ?  ?  ?  ?  ?  ?  ?  ?  ?  ?  ?  ?  ?  ?  ?  ?  ?  ?  ?  ?   ?   ?   ?   ? ? ?  Pertinent Vitals/ Pain       Pain Assessment ?Pain Assessment: 0-10 ?Pain Score: 7  ?Pain Location: back ?Pain Descriptors / Indicators: Grimacing, Guarding ?Pain Intervention(s): Limited activity within patient's tolerance, Monitored during session, Premedicated before session, Repositioned ? ?   ?   ? ?Frequency ? Min 2X/week  ? ? ? ? ?  ?Progress Toward Goals ? ?OT Goals(current goals can now be found in the care plan section) ? Progress towards  OT goals: Progressing toward goals ? ?Acute Rehab OT Goals ?Patient Stated Goal: for back to get better and go home ?OT Goal Formulation: With patient ?Time For Goal Achievement: 02/11/22 ?Potential to Achieve Goals: Good  ?Plan Discharge plan remains appropriate   ? ?   ?AM-PAC OT "6 Clicks" Daily Activity     ?Outcome Measure ? ? Help from another person eating meals?: None ?Help from another person taking care of personal grooming?: A Little ?Help from another person toileting, which includes using toliet, bedpan, or urinal?: A Little ?Help from another person bathing (including washing, rinsing, drying)?: A Little ?Help from another person to put on and taking off regular upper body clothing?: A Little ?Help from another person to put on and taking off regular lower body clothing?: A Little ?6 Click Score: 19 ? ?  ?End of Session Equipment Utilized During Treatment: Rolling walker (2 wheels) ? ?OT Visit Diagnosis: Unsteadiness on feet (R26.81);Pain ?Pain - part of body:  (back) ?  ?Activity Tolerance Patient tolerated treatment well ?  ?Patient Left in bed;with call bell/phone within reach;with chair alarm set ?  ?   ? ?Time: 3244-0102 ?OT Time Calculation (min): 16 min ? ?Charges: OT General Charges ?$OT Visit: 1 Visit ?OT Treatments ?$Self Care/Home Management : 8-22 mins ? ?[address], OTR/L ?Acute Rehab Services ?Pager [phone] ?Office [phone] ? ? ? ?Almon Register ?01/30/2022, 3:11 PM ?

## 2022-01-30 NOTE — TOC Progression Note (Addendum)
Transition of Care (TOC) - Progression Note  ? ? ?Patient Details  ?Name: [name] ?MRN: [id number] ?Date of Birth: [birth year] ? ?Transition of Care (TOC) CM/SW Contact  ?Angelita Ingles, RN ?Phone Number:[phone] ? ?01/30/2022, 10:20 AM ? ?Clinical Narrative:    ?[place] referral has been sent to [facility] with [place]. Acceptance pending.  ? ?21 [place] referral has been accepted by [facility].  ? ?1414 CM received message from [employer] with [employer] following for home infusion. Pam is requesting that MD sign outpatient parenteral antibiotic therapy orders. Message has been sent to MD ? ?1421 MD acknowledges message and states that order is signed. Carolynn Sayers with [employer] has been made aware.  ? ?3614 DME orders for youth walker with 5 inch wheels and shower chair have been called in to [employer]. Per [employer] insurance will not cover shower chair & they will reach out to patient to make her aware.  ?  ?  ? ?Expected Discharge Plan and Services ?  ?  ?  ?  ?  ?                ?  ?  ?  ?  ?  ?  ?  ?  ?  ?  ? ? ?Social Determinants of Health (SDOH) Interventions ?  ? ?Readmission Risk Interventions ?No flowsheet data found. ? ?

## 2022-01-30 NOTE — Progress Notes (Signed)
Mobility Specialist: Progress Note ? ? 01/30/22 1126  ?Mobility  ?Activity Ambulated with assistance in room  ?Level of Assistance Contact guard assist, steadying assist  ?Assistive Device Front wheel walker  ?Distance Ambulated (ft) 20 ft  ?Activity Response Tolerated fair  ?$Mobility charge 1 Mobility  ? ?Pt received in chair and agreeable to ambulation. During initial steps from chair pt experienced LLE knee buckling which she said hasn't happened before. C/o back pain during mobility which she said radiates down BLE, no rating given. Requesting pain medication at end of session, RN notified. Pt has call bell at her side.  ? ?[name] [name] ?Mobility Specialist ?Mobility Specialist [place]: [phone] ?Mobility Specialist [place]: [phone] ? ?

## 2022-01-30 NOTE — Progress Notes (Addendum)
PHARMACY CONSULT NOTE FOR: ? ?OUTPATIENT  PARENTERAL ANTIBIOTIC THERAPY (OPAT) ? ?Indication: MSSA Thoracic Discitis ?Regimen: Cefazolin 2g IV every 8 hours ?End date: 03/24/2022 ? ?IV antibiotic discharge orders are pended. ?To discharging provider:  please sign these orders via discharge navigator,  ?Select New Orders & click on the button choice - Manage This Unsigned Work.  ?  ? ?Thank you for allowing pharmacy to be a part of this patient's care. ? ?Laurey Arrow, PharmD ?PGY1 Pharmacy Resident ?01/30/2022  3:18 PM ? ?Please check [email or web address] for unit-specific pharmacy phone numbers. ? ? ?

## 2022-01-31 ENCOUNTER — Encounter (HOSPITAL_COMMUNITY): Payer: Self-pay | Admitting: Internal Medicine

## 2022-01-31 DIAGNOSIS — A4101 Sepsis due to Methicillin susceptible Staphylococcus aureus: Secondary | ICD-10-CM

## 2022-01-31 LAB — CULTURE, BLOOD (ROUTINE X 2)
Culture: NO GROWTH
Special Requests: ADEQUATE

## 2022-01-31 LAB — GLUCOSE, CAPILLARY
Glucose-Capillary: 114 mg/dL — ABNORMAL HIGH (ref 70–99)
Glucose-Capillary: 125 mg/dL — ABNORMAL HIGH (ref 70–99)

## 2022-01-31 MED ORDER — SODIUM CHLORIDE 0.9% FLUSH
10.0000 mL | Freq: Two times a day (BID) | INTRAVENOUS | Status: DC
  Administered 2022-01-31: 10 mL

## 2022-01-31 MED ORDER — CHLORHEXIDINE GLUCONATE CLOTH 2 % EX PADS
6.0000 | MEDICATED_PAD | Freq: Every day | CUTANEOUS | Status: DC
  Administered 2022-01-31: 6 via TOPICAL

## 2022-01-31 MED ORDER — SODIUM CHLORIDE 0.9% FLUSH: 10.0000 mL | INTRAVENOUS | Status: DC | PRN

## 2022-01-31 MED ORDER — BISACODYL 10 MG RE SUPP
10.0000 mg | Freq: Once | RECTAL | Status: AC
  Administered 2022-01-31: 10 mg via RECTAL
  Filled 2022-01-31: qty 1

## 2022-01-31 NOTE — Progress Notes (Signed)
Peripherally Inserted Central Catheter Placement ? ?The IV Nurse has discussed with the patient and/or persons authorized to consent for the patient, the purpose of this procedure and the potential benefits and risks involved with this procedure.  The benefits include less needle sticks, lab draws from the catheter, and the patient may be discharged home with the catheter. Risks include, but not limited to, infection, bleeding, blood clot (thrombus formation), and puncture of an artery; nerve damage and irregular heartbeat and possibility to perform a PICC exchange if needed/ordered by physician.  Alternatives to this procedure were also discussed.  Bard Power PICC patient education guide, fact sheet on infection prevention and patient information card has been provided to patient /or left at bedside.   ? ?PICC Placement Documentation  ?PICC Single Lumen 01/31/22 Right Brachial 34 cm 0 cm (Active)  ?Indication for Insertion or Continuance of Line Home intravenous therapies (PICC only) 01/31/22 1000  ?Exposed Catheter (cm) 0 cm 01/31/22 1000  ?Site Assessment Clean, Dry, Intact 01/31/22 1000  ?Line Status Flushed;Saline locked;Blood return noted 01/31/22 1000  ?Dressing Type Securing device;Transparent 01/31/22 1000  ?Dressing Status Antimicrobial disc in place 01/31/22 1000  ?Safety Lock Not Applicable 15/05/69 7948  ?Line Care Connections checked and tightened 01/31/22 1000  ?Line Adjustment (NICU/IV Team Only) No 01/31/22 1000  ?Dressing Intervention New dressing 01/31/22 1000  ?Dressing Change Due 02/07/22 01/31/22 1000  ? ? ? ? ? ?Mickel Baas  Zaniya Mcaulay ?01/31/2022, 10:38 AM ? ?

## 2022-01-31 NOTE — Progress Notes (Incomplete)
{  Select_TRH_Note:[id number]} 

## 2022-01-31 NOTE — Progress Notes (Signed)
Physical Therapy Treatment ?Patient Details ?Name: [name] ?MRN: [id number] ?DOB: Jun 10, 1946 ?Today's Date: 01/31/2022 ? ? ?History of Present Illness 76 y/o female presented to ED on 01/26/22 for complaints of severe back pain. Went to urgent care on 3/5 but no cause was found on imaging. 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 PMH: RA, DM, HTN, hx of ACDF 2017, hx of back sx 2019 ? ?  ?PT Comments  ? ? Pt tolerates treatment well, ambulating for increased distances and mobilizing at a supervision level. PT continues to encourage progressive ambulation and frequent mobility at home. Home Health PT will remain beneficial in an effort to return to independent mobility.   ?Recommendations for follow up therapy are one component of a multi-disciplinary discharge planning process, led by the attending physician.  Recommendations may be updated based on patient status, additional functional criteria and insurance authorization. ? ?Follow Up Recommendations ? Home health PT ?  ?  ?Assistance Recommended at Discharge Intermittent Supervision/Assistance  ?Patient can return home with the following A little help with walking and/or transfers;A little help with bathing/dressing/bathroom;Assistance with cooking/housework;Help with stairs or ramp for entrance;Assist for transportation ?  ?Equipment Recommendations ? None recommended by PT (pediatric RW already delivered to room)  ?  ?Recommendations for Other Services   ? ? ?  ?Precautions / Restrictions Precautions ?Precautions: Back ?Precaution Booklet Issued: No ?Precaution Comments: pt performing log roll well without cues ?Restrictions ?Weight Bearing Restrictions: No  ?  ? ?Mobility ? Bed Mobility ?Overal bed mobility: Needs Assistance ?Bed Mobility: Rolling, Sidelying to Sit ?Rolling:  Supervision ?Sidelying to sit: Supervision ?  ?  ?  ?  ?  ? ?Transfers ?Overall transfer level: Needs assistance ?Equipment used: Rolling walker (2 wheels) ?Transfers: Sit to/from Stand ?Sit to Stand: Supervision ?  ?  ?  ?  ?  ?  ?  ? ?Ambulation/Gait ?Ambulation/Gait assistance: Supervision ?Gait Distance (Feet): 35 Feet (35' x 2) ?Assistive device: Rolling walker (2 wheels) ?Gait Pattern/deviations: Step-through pattern ?Gait velocity: reduced ?Gait velocity interpretation: <1.8 ft/sec, indicate of risk for recurrent falls ?  ?General Gait Details: pt with slowed step-through gait ? ? ?Stairs ?  ?  ?  ?  ?  ? ? ?Wheelchair Mobility ?  ? ?Modified Rankin (Stroke Patients Only) ?  ? ? ?  ?Balance Overall balance assessment: Needs assistance ?Sitting-balance support: No upper extremity supported, Feet supported ?Sitting balance-Leahy Scale: Good ?  ?  ?Standing balance support: Single extremity supported, Bilateral upper extremity supported, Reliant on assistive device for balance ?Standing balance-Leahy Scale: Poor ?  ?  ?  ?  ?  ?  ?  ?  ?  ?  ?  ?  ?  ? ?  ?Cognition Arousal/Alertness: Awake/alert ?Behavior During Therapy: [facility] for tasks assessed/performed ?Overall Cognitive Status: Within Functional Limits for tasks assessed ?  ?  ?  ?  ?  ?  ?  ?  ?  ?  ?  ?  ?  ?  ?  ?  ?  ?  ?  ? ?  ?Exercises   ? ?  ?General Comments General comments (skin integrity, edema, etc.): VSS on RA ?  ?  ? ?Pertinent Vitals/Pain Pain Assessment ?Pain Assessment: Faces ?Faces Pain Scale: Hurts even more ?Pain Location: mid-back ?Pain Descriptors / Indicators: Aching ?Pain Intervention(s): Monitored during session  ? ? ?  Home Living   ?  ?  ?  ?  ?  ?  ?  ?  ?  ?   ?  ?Prior Function    ?  ?  ?   ? ?PT Goals (current goals can now be found in the care plan section) Acute Rehab PT Goals ?Patient Stated Goal: to reduce pain ?Progress towards PT goals: Progressing toward goals ? ?  ?Frequency ? ? ? Min 4X/week ? ? ? ?  ?PT Plan Current  plan remains appropriate  ? ? ?Co-evaluation   ?  ?  ?  ?  ? ?  ?AM-PAC PT "6 Clicks" Mobility   ?Outcome Measure ? Help needed turning from your back to your side while in a flat bed without using bedrails?: A Little ?Help needed moving from lying on your back to sitting on the side of a flat bed without using bedrails?: A Little ?Help needed moving to and from a bed to a chair (including a wheelchair)?: A Little ?Help needed standing up from a chair using your arms (e.g., wheelchair or bedside chair)?: A Little ?Help needed to walk in hospital room?: A Little ?Help needed climbing 3-5 steps with a railing? : A Little ?6 Click Score: 18 ? ?  ?End of Session   ?Activity Tolerance: Patient tolerated treatment well ?Patient left: in chair;with call bell/phone within reach;with family/visitor present ?Nurse Communication: Mobility status ?PT Visit Diagnosis: Unsteadiness on feet (R26.81);Muscle weakness (generalized) (M62.81);Pain ?Pain - part of body:  (back) ?  ? ? ?Time: 4174-0814 ?PT Time Calculation (min) (ACUTE ONLY): 18 min ? ?Charges:  $Gait Training: 8-22 mins          ?          ? ?Zenaida Niece, PT, DPT ?Acute Rehabilitation ?Pager: [phone] ?Office [phone] ? ? ? ?Zenaida Niece ?01/31/2022, 10:35 AM ? ?

## 2022-01-31 NOTE — Progress Notes (Signed)
Alexa RN notified to remove all PIV's and change IV tubing.PICC is ready to use. ?

## 2022-01-31 NOTE — Progress Notes (Signed)
Mobility Specialist: Progress Note ? ? 01/31/22 1512  ?Mobility  ?Activity Ambulated with assistance in room  ?Level of Assistance Contact guard assist, steadying assist  ?Assistive Device Front wheel walker  ?Distance Ambulated (ft) 40 ft  ?Activity Response Tolerated well  ?$Mobility charge 1 Mobility  ? ?Pt received in bed and agreeable to ambulation. Able to ambulate to the door and back today with c/o back pain, no rating given. Pt otherwise asymptomatic throughout. Pt back to bed with call bell at her side and is hopeful for discharge soon.  ? ?[name] ?Mobility Specialist ?Mobility Specialist [place]: [phone] ?Mobility Specialist [place]: [phone] ? ?

## 2022-01-31 NOTE — Discharge Summary (Addendum)
Physician Discharge Summary   Patient: [name] MRN: [id number] DOB: [birth year]  Admit date:     01/26/2022  Discharge date: 01/31/22  Discharge Physician: Jennye Boroughs   PCP: Merrilee Seashore, MD   Recommendations at discharge:   Follow-up with infectious disease specialist on 02/20/2022 at 11:15 AM.  Follow-up with PCP in 1 to 2 weeks  Discharge Diagnoses: Principal Problem:   Sepsis ([place]) discitis/osteomyelitis, epidural phlegmon Active Problems:   HTN (hypertension)   Diabetes ([place])   Centrilobular emphysema ([facility])   Rheumatoid arthritis ([place])   Hyperlipidemia   Discitis   Epidural abscess   Acute urinary retention   Hypokalemia   Medication monitoring encounter  Resolved Problems:   * No resolved hospital problems. *     Hospital Course: [name] is a 76 year old female with PMH significant for rheumatoid arthritis, type 2 diabetes mellitus, essential hypertension, osteoarthritis s/p bilateral hip replacements, history of ACDF 2017 and lumbar laminectomy/decompression with microdiscectomy 2019, history of previous MRSA infection requiring 6-week outpatient antibiotic course.  She presented to [facility] ED on 3/6 for back pain that has been progressing over a period of about 3 days.  She did not have any urinary or fecal incontinence, weakness, tingling or numbness in the extremities.   In the ED, temperature 102.2 F, HR 127, RR 25, BP 156/90, SPO2 94% on room air.  WBC 25.8, hemoglobin 13.0, platelet count 256.   MR L/T-spine with and without contrast with extensive epidural enhancement thoracic canal compatible with epidural phlegmon, sclerotic changes T8, T9, T10 with mild edema suspicious for discitis/osteomyelitis, mild/moderate lumbar stenosis and moderate to severe foraminal stenosis thoracic spine.  ED physician consulted neurosurgery, Dr. Christella Noa who reviewed imaging studies and recommended IR for needle biopsy of T8 or T9 and to continue antibiotic  treatment.   She was admitted to the hospital for sepsis secondary to discitis/osteomyelitis at T8-T9 and concern for epidural phlegmon.  She was treated with empiric IV antibiotics.  Blood culture showed MSSA.  ID was consulted to assist with management.  ID recommended IV Ancef to be given via PICC line through 03/24/2022.  Foley catheter was placed for acute urinary retention.  Voiding trial was done on the day of discharge and patient was able to pass urine without any problems after removal of Foley catheter.  She also complained of constipation and she was able to move her bowels after she was given Dulcolax suppository.  Her condition has improved and she is deemed stable for discharge to home today.  PT and OT recommended home health therapy.         Consultants: Neurosurgeon, infectious disease, interventional radiologist Procedures performed: Fluoroscopy guided T9 core bone biopsy and T8-9 disc aspiration Disposition: Home health Diet recommendation:  Discharge Diet Orders (From admission, onward)     Start     Ordered   01/31/22 0000  Diet - low sodium heart healthy        01/31/22 1434           Cardiac diet DISCHARGE MEDICATION: Allergies as of 01/31/2022       Reactions   Ivp Dye [iodinated Contrast Media] Nausea And Vomiting   Prednisone Other (See Comments)   reflux Other reaction(s): acid reflux Other reaction(s): acid reflux Other reaction(s): acid reflux        Medication List     STOP taking these medications    brompheniramine-pseudoephedrine-DM 30-2-10 MG/5ML syrup   Enbrel SureClick 50 MG/ML injection Generic drug: etanercept  methotrexate 2.5 MG tablet Commonly known as: RHEUMATREX       TAKE these medications    acetaminophen 500 MG tablet Commonly known as: TYLENOL Take 1,000 mg by mouth daily as needed for moderate pain or headache.   albuterol 1.25 MG/3ML nebulizer solution Commonly known as: ACCUNEB Take 1 ampule by  nebulization every 4 (four) hours as needed for wheezing.   amoxicillin 500 MG capsule Commonly known as: AMOXIL Take 2,000 mg by mouth See admin instructions. 1 hour prior to dental appointment   bumetanide 1 MG tablet Commonly known as: BUMEX Take 1 mg by mouth daily.   ceFAZolin  IVPB Commonly known as: ANCEF Inject 2 g into the vein every 8 (eight) hours. Indication:  MSSA Thoracic Discitis First Dose: Yes Last Day of Therapy: 03/24/2022 Labs - Once weekly:  CBC/D and BMP, Labs - Every other week:  ESR and CRP Method of administration: IV Push Method of administration may be changed at the discretion of home infusion pharmacist based upon assessment of the patient and/or caregiver's ability to self-administer the medication ordered.   cetirizine 10 MG tablet Commonly known as: ZYRTEC Take 10 mg by mouth daily as needed for allergies (during Spring/Summer).   cholecalciferol 25 MCG (1000 UNIT) tablet Commonly known as: VITAMIN D3 Take 1,000 Units by mouth daily.   diclofenac 75 MG EC tablet Commonly known as: VOLTAREN Take 1 tablet by mouth daily as needed for mild pain.   diclofenac Sodium 1 % Gel Commonly known as: VOLTAREN Apply 4 g topically 4 (four) times daily.   diltiazem 180 MG 24 hr capsule Commonly known as: CARDIZEM CD Take 180 mg by mouth daily.   docusate sodium 100 MG capsule Commonly known as: COLACE Take 100 mg by mouth daily as needed for mild constipation.   enalapril 20 MG tablet Commonly known as: VASOTEC Take 20 mg by mouth 2 (two) times daily.   fluticasone 50 MCG/ACT nasal spray Commonly known as: FLONASE Place 1 spray into both nostrils daily.   folic acid 102 MCG tablet Commonly known as: FOLVITE Take 800 mcg by mouth daily.   gabapentin 300 MG capsule Commonly known as: NEURONTIN Take 300 mg by mouth at bedtime.   lovastatin 40 MG tablet Commonly known as: MEVACOR Take 40 mg by mouth at bedtime.   meclizine 12.5 MG  tablet Commonly known as: ANTIVERT Take 12.5 mg by mouth 2 (two) times daily as needed for dizziness.   metFORMIN 500 MG 24 hr tablet Commonly known as: GLUCOPHAGE-XR Take 500 mg by mouth in the morning and at bedtime.   methocarbamol 500 MG tablet Commonly known as: ROBAXIN Take 1 tablet (500 mg total) by mouth 2 (two) times daily. What changed: when to take this   multivitamin with minerals Tabs tablet Take 1 tablet by mouth daily.   omeprazole 20 MG tablet Commonly known as: PRILOSEC OTC Take 20 mg by mouth daily as needed (heartburn).   ONE TOUCH ULTRA 2 w/Device Kit Check blood sugar twice per week   OneTouch Delica Plus [id number] Misc Use to check blood sugar   glucose blood test strip   OneTouch Ultra test strip Generic drug: glucose blood   sodium chloride 0.65 % Soln nasal spray Commonly known as: OCEAN Place 1-2 sprays into the nose 4 (four) times daily as needed for congestion.   Trelegy Ellipta 200-62.5-25 MCG/ACT Aepb Generic drug: Fluticasone-Umeclidin-Vilant Take 1 puff by mouth daily.  Durable Medical Equipment  (From admission, onward)           Start     Ordered   01/30/22 1452  For home use only DME Shower stool  Once        01/30/22 1451   01/30/22 1449  For home use only DME Walker youth  Once       Comments: With 5 inch wheels  Question:  Patient needs a walker to treat with the following condition  Answer:  Weakness   01/30/22 1451              Discharge Care Instructions  (From admission, onward)           Start     Ordered   01/30/22 0000  Change dressing on IV access line weekly and PRN  (Home infusion instructions - [facility] )        01/30/22 1417            Follow-up Information     [facility].   Specialty: Home Health Services Why: Your home health has been set up with [facility]. The office will call you with start of service. If you have any questions  please call the number listed above. Contact information: [place] [address] [phone]                Discharge Exam: Danley Danker Weights   01/26/22 1243  Weight: 67.1 kg   GEN: NAD SKIN: Warm and dry EYES: No pallor or icterus ENT: MMM CV: RRR PULM: CTA B ABD: soft, ND, NT, +BS CNS: AAO x 3, non focal EXT: No edema or tenderness   Condition at discharge: stable  The results of significant diagnostics from this hospitalization (including imaging, microbiology, ancillary and laboratory) are listed below for reference.   Imaging Studies: DG Chest 2 View  Result Date: 01/25/2022 CLINICAL DATA:  Chest pain. EXAM: CHEST - 2 VIEW COMPARISON:  05/09/2018. FINDINGS: Cardiac silhouette is normal in size. No mediastinal or hilar masses or evidence of adenopathy. Clear lungs.  No pleural effusion or pneumothorax. Skeletal structures are intact. IMPRESSION: No active cardiopulmonary disease. Electronically Signed   By: Lajean Manes M.D.   On: 01/25/2022 15:53   MR THORACIC SPINE W WO CONTRAST  Result Date: 01/26/2022 CLINICAL DATA:  Osteomyelitis, thoracic; Lumbar radiculopathy, infection suspected, positive xray/CT EXAM: MRI THORACIC AND LUMBAR SPINE WITHOUT AND WITH CONTRAST TECHNIQUE: Multiplanar and multiecho pulse sequences of the thoracic and lumbar spine were obtained without and with intravenous contrast. CONTRAST:  9m GADAVIST GADOBUTROL 1 MMOL/ML IV SOLN COMPARISON:  CT of the chest January 25, 2022. FINDINGS: MRI THORACIC SPINE FINDINGS Alignment: Mildly exaggerated thoracic kyphosis. No substantial sagittal subluxation. Vertebrae: Mild T8-T9 and to lesser extent T9-T10 T2 hyperintense signal within the disc spaces. Adjacent marrow signal change involving the T9 and to a lesser extent inferior T8 and superior T10 vertebral bodies, which is T1 and T2 hypointense and sclerotic on prior CT. Cord: Normal cord signal. Paraspinal and other soft tissues: Linear  opacities within bilateral dependent lungs. Disc levels: Fairly extensive abnormal enhancement in the dorsal and ventral canal, compatible with epidural phlegmon. Dorsal enhancement extends from the cervicothoracic junction to thoracolumbar junction. Ventral enhancement extends from approximately T6-T7 to T10-T11. Enhancement measures up to 3 mm in thickness dorsally and 2 mm ventrally. No discrete nonenhancing component to suggest abscess. This is superimposed on multilevel degenerative change, including: Posterior  disc bulges at T1-T2, T6-T7, and T12-L1 partially efface ventral CSF with mild canal stenosis at these levels. Posterior disc bulges and ligamentum flavum thickening at T7-T8, T8-T9, T9-T10 and T10-T11 with resulting moderate canal stenosis at these levels. Multilevel facet arthropathy with moderate to severe foraminal stenosis bilaterally at T8-T9, T9-T10, and on the right at T10-T11, T11-T12 and T12-L1. MRI LUMBAR SPINE FINDINGS Segmentation: Transitional lumbosacral anatomy. Four non rib-bearing lumbar vertebral bodies. Sacralization of L5. Alignment:  Grade 1 anterolisthesis of L3 on L4. Vertebrae: Endplate signal changes about the L3-L4 disc, favored degenerative. No appreciable disc edema. Otherwise, no focal marrow edema in the lumbar spine. Conus medullaris: Extends to the inferior L1 level and appears normal. Paraspinal and other soft tissues: Right renal cysts. Disc levels: T12-L1: Left greater than right facet arthropathy. Resulting moderate to severe left and mild right foraminal stenosis. Patent canal. L1-L2: Broad disc bulge with severe right and moderate left facet arthropathy. Resulting severe bilateral foraminal stenosis. Mild canal stenosis. L2-L3: Severe right facet arthropathy. Prior posterior decompression. Severe right and mild left foraminal stenosis. Patent canal. L3-L4: Severe disc height loss with endplate signal changes. Grade 1 anterolisthesis. Broad disc bulge with endplate  spurring. Resulting severe bilateral foraminal stenosis. Prior posterior decompression. Transverse narrowing of the canal with resulting mild to moderate canal stenosis. Narrowing of the right greater than left subarticular recesses. L4-L5: Posterior disc bulge. Left greater than right facet arthropathy. Resulting severe left and moderate right foraminal stenosis. Posterior decompression with patent canal. L5-S1: Transitional anatomy.  Patent canal and foramina. 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 1. Extensive epidural enhancement throughout the thoracic canal, compatible with epidural phlegmon that is detailed above and measures up to 3 mm in thickness dorsally and 2 mm ventrally. 2. Marked sclerotic change of the T9 and surrounding T8 and T10 endplates with mild edema in the T8-T9 and T9-T10 disc spaces. Findings are suspicious for chronic discitis/osteomyelitis given the above findings. 3. When superimposed on degenerative change, there is multilevel moderate canal stenosis in the lower thoracic spine. Also, mild to moderate canal stenosis at L3-L4 with degenerative disease and prior posterior decompression at this level. 4. Multilevel moderate to severe foraminal stenosis in the thoracic spine and multilevel severe foraminal stenosis in the lumbar spine, as detailed above. Electronically Signed   By: Margaretha Sheffield M.D.   On: 01/26/2022 17:43   MR Lumbar Spine W Wo Contrast  Result Date: 01/26/2022 CLINICAL DATA:  Osteomyelitis, thoracic; Lumbar radiculopathy, infection suspected, positive xray/CT EXAM: MRI THORACIC AND LUMBAR SPINE WITHOUT AND WITH CONTRAST TECHNIQUE: Multiplanar and multiecho pulse sequences of the thoracic and lumbar spine were obtained without and with intravenous contrast. CONTRAST:  48m GADAVIST GADOBUTROL 1 MMOL/ML IV SOLN COMPARISON:  CT of the chest January 25, 2022. FINDINGS: MRI THORACIC SPINE FINDINGS Alignment:  Mildly exaggerated thoracic kyphosis. No substantial sagittal subluxation. Vertebrae: Mild T8-T9 and to lesser extent T9-T10 T2 hyperintense signal within the disc spaces. Adjacent marrow signal change involving the T9 and to a lesser extent inferior T8 and superior T10 vertebral bodies, which is T1 and T2 hypointense and sclerotic on prior CT. Cord: Normal cord signal. Paraspinal and other soft tissues: Linear opacities within bilateral dependent lungs. Disc levels: Fairly extensive abnormal enhancement in the dorsal and ventral canal, compatible with epidural phlegmon. Dorsal enhancement extends from the cervicothoracic junction to thoracolumbar junction. Ventral enhancement extends from approximately T6-T7 to T10-T11. Enhancement measures up to 3 mm in  thickness dorsally and 2 mm ventrally. No discrete nonenhancing component to suggest abscess. This is superimposed on multilevel degenerative change, including: Posterior disc bulges at T1-T2, T6-T7, and T12-L1 partially efface ventral CSF with mild canal stenosis at these levels. Posterior disc bulges and ligamentum flavum thickening at T7-T8, T8-T9, T9-T10 and T10-T11 with resulting moderate canal stenosis at these levels. Multilevel facet arthropathy with moderate to severe foraminal stenosis bilaterally at T8-T9, T9-T10, and on the right at T10-T11, T11-T12 and T12-L1. MRI LUMBAR SPINE FINDINGS Segmentation: Transitional lumbosacral anatomy. Four non rib-bearing lumbar vertebral bodies. Sacralization of L5. Alignment:  Grade 1 anterolisthesis of L3 on L4. Vertebrae: Endplate signal changes about the L3-L4 disc, favored degenerative. No appreciable disc edema. Otherwise, no focal marrow edema in the lumbar spine. Conus medullaris: Extends to the inferior L1 level and appears normal. Paraspinal and other soft tissues: Right renal cysts. Disc levels: T12-L1: Left greater than right facet arthropathy. Resulting moderate to severe left and mild right foraminal  stenosis. Patent canal. L1-L2: Broad disc bulge with severe right and moderate left facet arthropathy. Resulting severe bilateral foraminal stenosis. Mild canal stenosis. L2-L3: Severe right facet arthropathy. Prior posterior decompression. Severe right and mild left foraminal stenosis. Patent canal. L3-L4: Severe disc height loss with endplate signal changes. Grade 1 anterolisthesis. Broad disc bulge with endplate spurring. Resulting severe bilateral foraminal stenosis. Prior posterior decompression. Transverse narrowing of the canal with resulting mild to moderate canal stenosis. Narrowing of the right greater than left subarticular recesses. L4-L5: Posterior disc bulge. Left greater than right facet arthropathy. Resulting severe left and moderate right foraminal stenosis. Posterior decompression with patent canal. L5-S1: Transitional anatomy.  Patent canal and foramina. 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 1. Extensive epidural enhancement throughout the thoracic canal, compatible with epidural phlegmon that is detailed above and measures up to 3 mm in thickness dorsally and 2 mm ventrally. 2. Marked sclerotic change of the T9 and surrounding T8 and T10 endplates with mild edema in the T8-T9 and T9-T10 disc spaces. Findings are suspicious for chronic discitis/osteomyelitis given the above findings. 3. When superimposed on degenerative change, there is multilevel moderate canal stenosis in the lower thoracic spine. Also, mild to moderate canal stenosis at L3-L4 with degenerative disease and prior posterior decompression at this level. 4. Multilevel moderate to severe foraminal stenosis in the thoracic spine and multilevel severe foraminal stenosis in the lumbar spine, as detailed above. Electronically Signed   By: Margaretha Sheffield M.D.   On: 01/26/2022 17:43   IR Fluoro Guide Ndl Plmt / BX  Result Date: 01/27/2022 INDICATION: RHEAGAN NAYAK  is a 76 y.o. female with a past medical history significant for RA, DM, HTN who presented to the ED on 01/26/22 with worsening upper back pain that radiates to her chest X 3 day. Found to be tachycardic and febrile with leukocystosis and elevated lactic acid. 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 She comes to our service today for a T9 vertebral body core bone biopsy with T8-9 disc aspiration. EXAM: Fluoroscopy guided T9 core bone biopsy and T8-9 fine-needle aspiration MEDICATIONS: Ancef 1 g IV. ANESTHESIA/SEDATION: Moderate (conscious) sedation was employed during this procedure. A total of Versed 2 mg and Fentanyl 75 mcg was administered intravenously by the radiology nurse. Total intra-service moderate Sedation Time:  45 minutes. The patient's level of consciousness and vital signs were monitored continuously by radiology nursing throughout the procedure under my direct supervision. FLUOROSCOPY: Radiation Exposure Index (as provided by the fluoroscopic device): 6767 mGy Kerma COMPLICATIONS: None immediate. PROCEDURE: Informed written consent was obtained from the patient after a thorough discussion of the procedural risks, benefits and alternatives. All questions were addressed. Maximal Sterile Barrier Technique was utilized including caps, mask, sterile gowns, sterile gloves, sterile drape, hand hygiene and skin antiseptic. A timeout was performed prior to the initiation of the procedure. The patient was placed in prone position on the angiography table. The thoracic spine region was prepped and draped in a sterile fashion. Under fluoroscopy, the T9 vertebral body was delineated and the skin area was marked. The skin was infiltrated with a 1% Lidocaine approximately 4 cm lateral to the spinous process projection on the left. Using a  22-gauge spinal needle, the soft issue and the peripedicular space and periosteum were infiltrated with Bupivacaine 0.5%. A skin incision was made at the access site. Subsequently, an 11-gauge Kyphon trocar was inserted under fluoroscopic guidance until contact with the pedicle was obtained. The trocar was inserted under light hammer tapping into the pedicle until the posterior boundaries of the vertebral body was reached. The diamond mandrill was removed and one core biopsy wasobtained. Attempt to advance a 20 gauge in a 22 gauge spinal needle through the endplate into the disc space proved unsuccessful. The cannula was later removed. Then, under fluoroscopy guidance and using extra pedicular approach a 22 gauge x 20 cm spinal needle was advanced into the T8-9 intervertebral disc. Two fine-needle aspiration samples were obtained with small amount of blood tinged fluid obtained. The access sites were cleaned and covered with a sterile bandage. IMPRESSION: Successful fluoroscopy guided T9 core bone biopsy and T8-9 disc fine-needle aspiration. Core biopsy sample was sent for tissue exam and fine needle aspiration samples were sent for Gram stain and culture. Electronically Signed   By: Pedro Earls M.D.   On: 01/27/2022 15:18   IR Fluoro Guide Ndl Plmt / BX  Result Date: 01/27/2022 INDICATION: LITZY DICKER is a 76 y.o. female with a past medical history significant for RA, DM, HTN who presented to the ED on 01/26/22 with worsening upper back pain that radiates to her chest X 3 day. Found to be tachycardic and febrile with leukocystosis and elevated lactic acid. 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 She comes to our service today for a T9 vertebral body core bone biopsy with T8-9 disc aspiration. EXAM: Fluoroscopy guided  T9 core bone biopsy and T8-9 fine-needle aspiration MEDICATIONS: Ancef 1 g IV. ANESTHESIA/SEDATION: Moderate (conscious) sedation was employed during this procedure. A total of Versed 2 mg and Fentanyl 75 mcg was administered intravenously by the radiology nurse. Total intra-service moderate Sedation Time: 45 minutes. The patient's level of consciousness and vital signs were monitored continuously by radiology nursing throughout the procedure under my direct supervision. FLUOROSCOPY: Radiation Exposure Index (as provided by the fluoroscopic device): 2094 mGy Kerma COMPLICATIONS: None immediate. PROCEDURE: Informed written consent was obtained from the patient after a thorough discussion of the procedural risks, benefits and alternatives. All questions were addressed. Maximal Sterile Barrier Technique was utilized including caps, mask, sterile gowns, sterile gloves, sterile drape, hand hygiene and skin antiseptic. A  timeout was performed prior to the initiation of the procedure. The patient was placed in prone position on the angiography table. The thoracic spine region was prepped and draped in a sterile fashion. Under fluoroscopy, the T9 vertebral body was delineated and the skin area was marked. The skin was infiltrated with a 1% Lidocaine approximately 4 cm lateral to the spinous process projection on the left. Using a 22-gauge spinal needle, the soft issue and the peripedicular space and periosteum were infiltrated with Bupivacaine 0.5%. A skin incision was made at the access site. Subsequently, an 11-gauge Kyphon trocar was inserted under fluoroscopic guidance until contact with the pedicle was obtained. The trocar was inserted under light hammer tapping into the pedicle until the posterior boundaries of the vertebral body was reached. The diamond mandrill was removed and one core biopsy wasobtained. Attempt to advance a 20 gauge in a 22 gauge spinal needle through the endplate into the disc space proved  unsuccessful. The cannula was later removed. Then, under fluoroscopy guidance and using extra pedicular approach a 22 gauge x 20 cm spinal needle was advanced into the T8-9 intervertebral disc. Two fine-needle aspiration samples were obtained with small amount of blood tinged fluid obtained. The access sites were cleaned and covered with a sterile bandage. IMPRESSION: Successful fluoroscopy guided T9 core bone biopsy and T8-9 disc fine-needle aspiration. Core biopsy sample was sent for tissue exam and fine needle aspiration samples were sent for Gram stain and culture. Electronically Signed   By: Pedro Earls M.D.   On: 01/27/2022 15:18   DG Chest Port 1 View  Result Date: 01/26/2022 CLINICAL DATA:  Will sepsis, central chest pain, back pain, shortness of breath EXAM: PORTABLE CHEST 1 VIEW COMPARISON:  Portable exam 1347 hours compared to 01/25/2022 FINDINGS: Upper normal size of cardiac silhouette. Atherosclerotic calcification and tortuosity of thoracic aorta. Lungs clear. No acute infiltrate, pleural effusion, or pneumothorax. Bones demineralized with evidence of prior cervicothoracic fusion and LEFT glenohumeral degenerative changes. IMPRESSION: No acute abnormalities. Aortic Atherosclerosis (ICD10-I70.0). Electronically Signed   By: Lavonia Dana M.D.   On: 01/26/2022 13:56   ECHOCARDIOGRAM COMPLETE  Result Date: 01/27/2022    ECHOCARDIOGRAM REPORT   Patient Name:   TYLIAH SCHLERETH Date of Exam: 01/27/2022 Medical Rec #:  [id number]      Height:       56.0 in Accession #:    [id number]     Weight:       148.0 lb Date of Birth:  July 31, 1946      BSA:          1.562 m Patient Age:    76 years       BP:           140/85 mmHg Patient Gender: F              HR:           127 bpm. Exam Location:  Inpatient Procedure: 2D Echo Indications:    Fever  History:        Patient has no prior history of Echocardiogram examinations.                 Signs/Symptoms:Shortness of Breath; Risk Factors:Dyslipidemia,                  Hypertension and Diabetes.  Sonographer:    Arlyss Gandy Referring Phys: [id number] [facility]  Sonographer Comments: Technically difficult study due to poor echo windows. IMPRESSIONS  1.  Left ventricular ejection fraction, by estimation, is 60 to 65%. Left ventricular ejection fraction by PLAX is 63 %. The left ventricle has normal function. The left ventricle has no regional wall motion abnormalities. There is moderate asymmetric left ventricular hypertrophy of the basal-septal segment. Left ventricular diastolic parameters are consistent with Grade I diastolic dysfunction (impaired relaxation). Elevated left ventricular end-diastolic pressure. The E/e' is 38.  2. Right ventricular systolic function is normal. The right ventricular size is normal. There is normal pulmonary artery systolic pressure. The estimated right ventricular systolic pressure is 63.8 mmHg.  3. The mitral valve is grossly normal. Trivial mitral valve regurgitation.  4. The aortic valve is tricuspid. Aortic valve regurgitation is not visualized.  5. The inferior vena cava is normal in size with greater than 50% respiratory variability, suggesting right atrial pressure of 3 mmHg. Comparison(s): No prior Echocardiogram. FINDINGS  Left Ventricle: Left ventricular ejection fraction, by estimation, is 60 to 65%. Left ventricular ejection fraction by PLAX is 63 %. The left ventricle has normal function. The left ventricle has no regional wall motion abnormalities. The left ventricular internal cavity size was normal in size. There is moderate asymmetric left ventricular hypertrophy of the basal-septal segment. Left ventricular diastolic parameters are consistent with Grade I diastolic dysfunction (impaired relaxation). Elevated left ventricular end-diastolic pressure. The E/e' is 16. Right Ventricle: The right ventricular size is normal. No increase in right ventricular wall thickness. Right ventricular systolic function is normal.  There is normal pulmonary artery systolic pressure. The tricuspid regurgitant velocity is 2.49 m/s, and  with an assumed right atrial pressure of 3 mmHg, the estimated right ventricular systolic pressure is 93.7 mmHg. Left Atrium: Left atrial size was normal in size. Right Atrium: Right atrial size was normal in size. Pericardium: There is no evidence of pericardial effusion. Mitral Valve: The mitral valve is grossly normal. Trivial mitral valve regurgitation. MV peak gradient, 9.9 mmHg. The mean mitral valve gradient is 4.0 mmHg. Tricuspid Valve: The tricuspid valve is grossly normal. Tricuspid valve regurgitation is trivial. Aortic Valve: The aortic valve is tricuspid. Aortic valve regurgitation is not visualized. Aortic valve mean gradient measures 5.0 mmHg. Aortic valve peak gradient measures 10.9 mmHg. Aortic valve area, by VTI measures 2.59 cm. Pulmonic Valve: The pulmonic valve was normal in structure. Pulmonic valve regurgitation is not visualized. Aorta: The aortic root and ascending aorta are structurally normal, with no evidence of dilitation. Venous: The inferior vena cava is normal in size with greater than 50% respiratory variability, suggesting right atrial pressure of 3 mmHg. IAS/Shunts: No atrial level shunt detected by color flow Doppler.  LEFT VENTRICLE PLAX 2D LV EF:         Left            Diastology                ventricular     LV e' medial:    4.90 cm/s                ejection        LV E/e' medial:  15.2                fraction by     LV e' lateral:   4.35 cm/s                PLAX is 63      LV E/e' lateral: 17.2                %.  LVIDd:         3.30 cm LVIDs:         2.20 cm LV PW:         1.10 cm LV IVS:        1.40 cm LVOT diam:     1.90 cm LV SV:         60 LV SV Index:   38 LVOT Area:     2.84 cm  RIGHT VENTRICLE             IVC RV Basal diam:  3.20 cm     IVC diam: 1.80 cm RV Mid diam:    3.20 cm RV S prime:     16.60 cm/s TAPSE (M-mode): 1.8 cm LEFT ATRIUM             Index         RIGHT ATRIUM           Index LA diam:        2.90 cm 1.86 cm/m   RA Area:     11.30 cm LA Vol (A2C):   44.3 ml 28.36 ml/m  RA Volume:   21.20 ml  13.57 ml/m LA Vol (A4C):   39.4 ml 25.23 ml/m LA Biplane Vol: 43.1 ml 27.59 ml/m  AORTIC VALVE AV Area (Vmax):    2.35 cm AV Area (Vmean):   2.33 cm AV Area (VTI):     2.59 cm AV Vmax:           165.00 cm/s AV Vmean:          106.000 cm/s AV VTI:            0.232 m AV Peak Grad:      10.9 mmHg AV Mean Grad:      5.0 mmHg LVOT Vmax:         137.00 cm/s LVOT Vmean:        87.100 cm/s LVOT VTI:          0.212 m LVOT/AV VTI ratio: 0.91  AORTA Ao Root diam: 2.70 cm Ao Asc diam:  3.10 cm MITRAL VALVE                TRICUSPID VALVE MV Area (PHT): 3.30 cm     TR Peak grad:   24.8 mmHg MV Area VTI:   2.62 cm     TR Vmax:        249.00 cm/s MV Peak grad:  9.9 mmHg MV Mean grad:  4.0 mmHg     SHUNTS MV Vmax:       1.57 m/s     Systemic VTI:  0.21 m MV Vmean:      94.0 cm/s    Systemic Diam: 1.90 cm MV Decel Time: 230 msec MV E velocity: 74.70 cm/s MV A velocity: 130.00 cm/s MV E/A ratio:  0.57 Lyman Bishop MD Electronically signed by Lyman Bishop MD Signature Date/Time: 01/27/2022/5:24:42 PM    Final    CT Angio Chest/Abd/Pel for Dissection W and/or Wo Contrast  Result Date: 01/25/2022 CLINICAL DATA:  Acute onset of chest and back pain, initial encounter EXAM: CT ANGIOGRAPHY CHEST, ABDOMEN AND PELVIS TECHNIQUE: Non-contrast CT of the chest was initially obtained. Multidetector CT imaging through the chest, abdomen and pelvis was performed using the standard protocol during bolus administration of intravenous contrast. Multiplanar reconstructed images and MIPs were obtained and reviewed to evaluate the vascular anatomy. RADIATION DOSE REDUCTION: This exam was performed according to the  departmental dose-optimization program which includes automated exposure control, adjustment of the mA and/or kV according to patient size and/or use of iterative reconstruction technique.  CONTRAST:  151m OMNIPAQUE IOHEXOL 350 MG/ML SOLN patient was pre-medicated for known contrast allergy COMPARISON:  Chest x-ray from earlier in the same day. FINDINGS: CTA CHEST FINDINGS Cardiovascular: Atherosclerotic calcifications of the thoracic aorta are noted. No aneurysmal dilatation is seen. No findings of dissection are noted. Heart is not significantly enlarged. Mild coronary calcifications are seen. The pulmonary artery as visualized shows no evidence of filling defect to suggest pulmonary embolism. Mediastinum/Nodes: Thoracic inlet is within normal limits. Esophagus as visualized is within normal limits. No sizable hilar or mediastinal adenopathy is noted. Lungs/Pleura: Mild atelectatic changes are noted in the bases. No focal confluent infiltrate is seen. No sizable parenchymal nodule is noted. Minimal emphysematous changes are noted. Musculoskeletal: Postsurgical changes are noted at the cervicothoracic junction. No acute rib abnormality is noted. Degenerative changes of the thoracic spine are seen. Significant sclerosis of the T10 vertebral body is noted of uncertain significance. These changes are new from prior CT in 2007 as well as prior plain film from 2019. These changes may be simply related to underlying degenerative change. Review of the MIP images confirms the above findings. CTA ABDOMEN AND PELVIS FINDINGS VASCULAR Aorta: Atherosclerotic calcifications are noted without aneurysmal dilatation or dissection. Celiac: Patent without evidence of aneurysm, dissection, vasculitis or significant stenosis. SMA: Patent without evidence of aneurysm, dissection, vasculitis or significant stenosis. 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 No focal stenosis is seen. IMA: Patent without evidence of aneurysm, dissection, vasculitis or significant stenosis. Inflow: Iliacs demonstrate atherosclerotic calcifications without aneurysmal dilatation or dissection. Veins: No specific venous abnormality is  seen. Review of the MIP images confirms the above findings. NON-VASCULAR Hepatobiliary: Liver is mildly decreased in attenuation consistent with fatty infiltration. Gallbladder is within normal limits. Pancreas: Unremarkable. No pancreatic ductal dilatation or surrounding inflammatory changes. Spleen: Normal in size without focal abnormality. Adrenals/Urinary Tract: Adrenal glands are within normal limits. Kidneys demonstrate renal cystic change on the right. No definitive renal calculi are seen. No obstructive changes are noted. The bladder is partially distended. Pelvis is somewhat obscured by scatter from bilateral hip replacements. Stomach/Bowel: Scattered diverticular change of the colon is noted without evidence of diverticulitis. The appendix is within normal limits. Small bowel and stomach are unremarkable. Small duodenal diverticulum is noted adjacent to the head of the pancreas. Lymphatic: No significant lymphadenopathy is seen. Reproductive: Status post hysterectomy. No adnexal masses. Other: No abdominal wall hernia or abnormality. No abdominopelvic ascites. 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 No acute bony abnormality is noted. Degenerative changes of lumbar spine are seen. Review of the MIP images confirms the above findings. IMPRESSION: No evidence of aortic dissection or aneurysmal dilatation. No evidence of pulmonary emboli. Degenerative changes of the thoracic and lumbar spine as described. Mild fatty infiltration of the liver. Diverticulosis without diverticulitis. Aortic Atherosclerosis (ICD10-I70.0) and Emphysema (ICD10-J43.9). Electronically Signed   By: MInez CatalinaM.D.   On: 01/25/2022 21:37   [place] SITE RITE  Result Date: 01/30/2022 If Site Rite image not attached, placement could not be confirmed due to current cardiac rhythm.   Microbiology: Results for orders placed or performed during the hospital encounter of 01/26/22  Resp Panel by RT-PCR (Flu A&B, Covid)  Nasopharyngeal Swab     Status: None   Collection Time: 01/26/22  2:06 PM   Specimen: Nasopharyngeal Swab; Nasopharyngeal(NP) swabs in vial transport medium  Result Value Ref Range Status   SARS Coronavirus 2 by RT PCR NEGATIVE NEGATIVE Final    Comment: (NOTE) SARS-CoV-2 target nucleic acids are NOT DETECTED.  The SARS-CoV-2 RNA is generally detectable in upper respiratory specimens during the acute phase of infection. The lowest concentration of SARS-CoV-2 viral copies this assay can detect is 138 copies/mL. A negative result does not preclude SARS-Cov-2 infection and should not be used as the sole basis for treatment or other patient management decisions. A negative result may occur with  improper specimen collection/handling, submission of specimen other than nasopharyngeal swab, presence of viral mutation(s) within the areas targeted by this assay, and inadequate number of viral copies(<138 copies/mL). A negative result must be combined with clinical observations, patient history, and epidemiological information. The expected result is Negative.  Fact Sheet for Patients:  EntrepreneurPulse.com.au  Fact Sheet for Healthcare Providers:  IncredibleEmployment.be  This test is no t yet approved or cleared by the [place] FDA and  has been authorized for detection and/or diagnosis of SARS-CoV-2 by FDA under an Emergency Use Authorization (EUA). This EUA will remain  in effect (meaning this test can be used) for the duration of the COVID-19 declaration under Section 564(b)(1) of the Act, 21 U.S.C.section 360bbb-3(b)(1), unless the authorization is terminated  or revoked sooner.       Influenza A by PCR NEGATIVE NEGATIVE Final   Influenza B by PCR NEGATIVE NEGATIVE Final    Comment: (NOTE) The Xpert Xpress SARS-CoV-2/FLU/RSV plus assay is intended as an aid in the diagnosis of influenza from Nasopharyngeal swab specimens and should not be  used as a sole basis for treatment. Nasal washings and aspirates are unacceptable for Xpert Xpress SARS-CoV-2/FLU/RSV testing.  Fact Sheet for Patients: EntrepreneurPulse.com.au  Fact Sheet for Healthcare Providers: IncredibleEmployment.be  This test is not yet approved or cleared by the [place] FDA and has been authorized for detection and/or diagnosis of SARS-CoV-2 by FDA under an Emergency Use Authorization (EUA). This EUA will remain in effect (meaning this test can be used) for the duration of the COVID-19 declaration under Section 564(b)(1) of the Act, 21 U.S.C. section 360bbb-3(b)(1), unless the authorization is terminated or revoked.  Performed at [facility], [place] [address], [place], [place] [address]   Blood Culture (routine x 2)     Status: None   Collection Time: 01/26/22  9:51 PM   Specimen: BLOOD RIGHT HAND  Result Value Ref Range Status   Specimen Description BLOOD RIGHT HAND  Final   Special Requests   Final    BOTTLES DRAWN AEROBIC AND ANAEROBIC Blood Culture adequate volume   Culture   Final    NO GROWTH 5 DAYS Performed at [facility], [place] [address], [place], [place] [address]    Report Status 01/31/2022 FINAL  Final  Urine Culture     Status: None   Collection Time: 01/27/22  1:47 AM   Specimen: Urine, Random  Result Value Ref Range Status   Specimen Description URINE, RANDOM  Final   Special Requests NONE  Final   Culture   Final    NO GROWTH Performed at [facility], [place] [address] [place], [place] [address]    Report Status 01/28/2022 FINAL  Final  Aerobic/Anaerobic Culture w Gram Stain (surgical/deep wound)     Status: None (Preliminary result)   Collection Time: 01/27/22  2:03 PM   Specimen: Wound; Tissue  Result Value Ref Range Status   Specimen Description  WOUND  Final   Special Requests INTERVERTEBRAL DISC T8/T9  Final   Gram Stain NO WBC SEEN NO ORGANISMS SEEN   Final    Culture   Final    FEW STAPHYLOCOCCUS AUREUS NO ANAEROBES ISOLATED; CULTURE IN PROGRESS FOR 5 DAYS    Report Status PENDING  Incomplete   Organism ID, Bacteria STAPHYLOCOCCUS AUREUS  Final      Susceptibility   Staphylococcus aureus - MIC*    CIPROFLOXACIN 1 SENSITIVE Sensitive     ERYTHROMYCIN >=8 RESISTANT Resistant     GENTAMICIN <=0.5 SENSITIVE Sensitive     OXACILLIN 0.5 SENSITIVE Sensitive     TETRACYCLINE <=1 SENSITIVE Sensitive     VANCOMYCIN <=0.5 SENSITIVE Sensitive     TRIMETH/SULFA <=10 SENSITIVE Sensitive     CLINDAMYCIN RESISTANT Resistant     RIFAMPIN <=0.5 SENSITIVE Sensitive     Inducible Clindamycin Value in next row Resistant      POSITIVEPerformed at [place] [address] [place], [place] [address]    * FEW STAPHYLOCOCCUS AUREUS  Culture, fungus without smear     Status: None (Preliminary result)   Collection Time: 01/27/22  2:03 PM   Specimen: Wound; Other  Result Value Ref Range Status   Specimen Description WOUND  Final   Special Requests INTERVERTEBRAL DISC T8/T9  Final   Culture   Final    NO FUNGUS ISOLATED AFTER 3 DAYS Performed at [facility], [address] [place], [place] [address]    Report Status PENDING  Incomplete  KOH prep     Status: None   Collection Time: 01/27/22  2:03 PM   Specimen: Wound  Result Value Ref Range Status   Specimen Description   Final    WOUND Performed at [facility], [place] [address], [place], [place] [address]    Special Requests   Final    INTERVERTEBRAL DISC T8/T9 Performed at [facility], [place] [address], [place], [place] [address]    [place] Prep   Final    NO YEAST OR FUNGAL ELEMENTS SEEN Performed at [facility], [address], [place], [place] [address]    Report Status 01/28/2022 FINAL  Final  Acid Fast Smear (AFB)     Status: None   Collection Time: 01/27/22  2:03 PM   Specimen: Tissue  Result Value Ref Range Status   AFB Specimen Processing Concentration  Final    Acid Fast Smear Negative  Final    Comment: (NOTE) Performed At: [facility] [place], [place] [id number] Rush Farmer MD [id number]    Source (AFB) WOUND  Final    Comment: INTERVERTEBRAL DISC T8/T9 Performed at [facility], [place] [address], [place], [place] [address]     Labs: CBC: Recent Labs  Lab 01/26/22 1406 01/27/22 0408 01/28/22 0651 01/29/22 0550 01/29/22 2253  WBC 25.8* 25.7* 14.8* 19.5* 20.7*  NEUTROABS 23.0*  --   --   --   --   HGB 13.0 12.5 11.9* 11.6* 12.0  HCT 40.5 39.0 35.7* 35.0* 36.2  MCV 84.7 86.9 83.2 81.6 81.2  PLT 256 244 206 216 505   Basic Metabolic Panel: Recent Labs  Lab 01/26/22 1406 01/27/22 0408 01/28/22 0651 01/29/22 0550 01/29/22 2253  NA 137 135 135 137 135  K 3.0* 3.9 3.4* 3.6 3.8  CL 100 101 101 101 98  CO2 25 17* '24 26 24  ' GLUCOSE 116* 91 80 104* 109*  BUN '19 19 13 8 ' 7*  CREATININE 0.94 0.87 0.70 0.70 0.56  CALCIUM 9.0 8.9 8.8* 8.7* 8.7*  MG  --   --   --  1.8  --    Liver Function Tests: Recent Labs  Lab 01/25/22 1700 01/26/22 1406 01/27/22 0408  AST 32 20 24  ALT '24 21 19  ' ALKPHOS 81 84 94  BILITOT 1.0 1.0 0.8  PROT 7.3 7.1 7.0  ALBUMIN 4.0 3.6 3.3*   CBG: Recent Labs  Lab 01/30/22 1130 01/30/22 1604 01/30/22 2104 01/31/22 0736 01/31/22 1146  GLUCAP 149* 142* 142* 114* 125*    Discharge time spent: greater than 30 minutes.  Signed: Jennye Boroughs, MD [facility] 01/31/2022

## 2022-02-01 LAB — AEROBIC/ANAEROBIC CULTURE W GRAM STAIN (SURGICAL/DEEP WOUND): Gram Stain: NONE SEEN

## 2022-02-02 DIAGNOSIS — E119 Type 2 diabetes mellitus without complications: Secondary | ICD-10-CM | POA: Diagnosis not present

## 2022-02-02 DIAGNOSIS — M4644 Discitis, unspecified, thoracic region: Secondary | ICD-10-CM | POA: Diagnosis not present

## 2022-02-02 DIAGNOSIS — E785 Hyperlipidemia, unspecified: Secondary | ICD-10-CM | POA: Diagnosis not present

## 2022-02-02 DIAGNOSIS — G062 Extradural and subdural abscess, unspecified: Secondary | ICD-10-CM | POA: Diagnosis not present

## 2022-02-02 DIAGNOSIS — I700 Atherosclerosis of aorta: Secondary | ICD-10-CM | POA: Diagnosis not present

## 2022-02-02 DIAGNOSIS — M5134 Other intervertebral disc degeneration, thoracic region: Secondary | ICD-10-CM | POA: Diagnosis not present

## 2022-02-02 DIAGNOSIS — I081 Rheumatic disorders of both mitral and tricuspid valves: Secondary | ICD-10-CM | POA: Diagnosis not present

## 2022-02-02 DIAGNOSIS — M19012 Primary osteoarthritis, left shoulder: Secondary | ICD-10-CM | POA: Diagnosis not present

## 2022-02-02 DIAGNOSIS — Z792 Long term (current) use of antibiotics: Secondary | ICD-10-CM | POA: Diagnosis not present

## 2022-02-02 DIAGNOSIS — Z7984 Long term (current) use of oral hypoglycemic drugs: Secondary | ICD-10-CM | POA: Diagnosis not present

## 2022-02-02 DIAGNOSIS — M4624 Osteomyelitis of vertebra, thoracic region: Secondary | ICD-10-CM | POA: Diagnosis not present

## 2022-02-02 DIAGNOSIS — M4726 Other spondylosis with radiculopathy, lumbar region: Secondary | ICD-10-CM | POA: Diagnosis not present

## 2022-02-02 DIAGNOSIS — M4804 Spinal stenosis, thoracic region: Secondary | ICD-10-CM | POA: Diagnosis not present

## 2022-02-02 DIAGNOSIS — A4101 Sepsis due to Methicillin susceptible Staphylococcus aureus: Secondary | ICD-10-CM | POA: Diagnosis not present

## 2022-02-02 DIAGNOSIS — M069 Rheumatoid arthritis, unspecified: Secondary | ICD-10-CM | POA: Diagnosis not present

## 2022-02-02 DIAGNOSIS — I119 Hypertensive heart disease without heart failure: Secondary | ICD-10-CM | POA: Diagnosis not present

## 2022-02-02 DIAGNOSIS — J432 Centrilobular emphysema: Secondary | ICD-10-CM | POA: Diagnosis not present

## 2022-02-02 DIAGNOSIS — M4316 Spondylolisthesis, lumbar region: Secondary | ICD-10-CM | POA: Diagnosis not present

## 2022-02-02 DIAGNOSIS — M47814 Spondylosis without myelopathy or radiculopathy, thoracic region: Secondary | ICD-10-CM | POA: Diagnosis not present

## 2022-02-02 DIAGNOSIS — Z452 Encounter for adjustment and management of vascular access device: Secondary | ICD-10-CM | POA: Diagnosis not present

## 2022-02-02 DIAGNOSIS — Z79899 Other long term (current) drug therapy: Secondary | ICD-10-CM | POA: Diagnosis not present

## 2022-02-02 DIAGNOSIS — M5136 Other intervertebral disc degeneration, lumbar region: Secondary | ICD-10-CM | POA: Diagnosis not present

## 2022-02-02 DIAGNOSIS — M48061 Spinal stenosis, lumbar region without neurogenic claudication: Secondary | ICD-10-CM | POA: Diagnosis not present

## 2022-02-02 DIAGNOSIS — M4805 Spinal stenosis, thoracolumbar region: Secondary | ICD-10-CM | POA: Diagnosis not present

## 2022-02-02 LAB — SURGICAL PATHOLOGY

## 2022-02-04 ENCOUNTER — Telehealth: Payer: Self-pay

## 2022-02-04 NOTE — Telephone Encounter (Signed)
Received approval for cefazolin from 02/04/22-11/22/22. Approval faxed to [employer]. ? ?Beryle Flock, RN ? ?

## 2022-02-04 NOTE — Telephone Encounter (Signed)
PA for cefazolin sent to plan 02/04/22. Awaiting response.  ? ?Key: [id number] ?Marland Kitchen ?Beryle Flock, RN ? ?

## 2022-02-06 ENCOUNTER — Ambulatory Visit: Payer: HMO | Admitting: Podiatry

## 2022-02-06 DIAGNOSIS — M4726 Other spondylosis with radiculopathy, lumbar region: Secondary | ICD-10-CM | POA: Diagnosis not present

## 2022-02-06 DIAGNOSIS — Z452 Encounter for adjustment and management of vascular access device: Secondary | ICD-10-CM | POA: Diagnosis not present

## 2022-02-06 DIAGNOSIS — E785 Hyperlipidemia, unspecified: Secondary | ICD-10-CM | POA: Diagnosis not present

## 2022-02-06 DIAGNOSIS — M069 Rheumatoid arthritis, unspecified: Secondary | ICD-10-CM | POA: Diagnosis not present

## 2022-02-06 DIAGNOSIS — M47814 Spondylosis without myelopathy or radiculopathy, thoracic region: Secondary | ICD-10-CM | POA: Diagnosis not present

## 2022-02-06 DIAGNOSIS — M4316 Spondylolisthesis, lumbar region: Secondary | ICD-10-CM | POA: Diagnosis not present

## 2022-02-06 DIAGNOSIS — M4644 Discitis, unspecified, thoracic region: Secondary | ICD-10-CM | POA: Diagnosis not present

## 2022-02-06 DIAGNOSIS — Z79899 Other long term (current) drug therapy: Secondary | ICD-10-CM | POA: Diagnosis not present

## 2022-02-06 DIAGNOSIS — I119 Hypertensive heart disease without heart failure: Secondary | ICD-10-CM | POA: Diagnosis not present

## 2022-02-06 DIAGNOSIS — I700 Atherosclerosis of aorta: Secondary | ICD-10-CM | POA: Diagnosis not present

## 2022-02-06 DIAGNOSIS — M48061 Spinal stenosis, lumbar region without neurogenic claudication: Secondary | ICD-10-CM | POA: Diagnosis not present

## 2022-02-06 DIAGNOSIS — Z792 Long term (current) use of antibiotics: Secondary | ICD-10-CM | POA: Diagnosis not present

## 2022-02-06 DIAGNOSIS — G062 Extradural and subdural abscess, unspecified: Secondary | ICD-10-CM | POA: Diagnosis not present

## 2022-02-06 DIAGNOSIS — M5134 Other intervertebral disc degeneration, thoracic region: Secondary | ICD-10-CM | POA: Diagnosis not present

## 2022-02-06 DIAGNOSIS — M5136 Other intervertebral disc degeneration, lumbar region: Secondary | ICD-10-CM | POA: Diagnosis not present

## 2022-02-06 DIAGNOSIS — A4101 Sepsis due to Methicillin susceptible Staphylococcus aureus: Secondary | ICD-10-CM | POA: Diagnosis not present

## 2022-02-06 DIAGNOSIS — J432 Centrilobular emphysema: Secondary | ICD-10-CM | POA: Diagnosis not present

## 2022-02-06 DIAGNOSIS — M4624 Osteomyelitis of vertebra, thoracic region: Secondary | ICD-10-CM | POA: Diagnosis not present

## 2022-02-06 DIAGNOSIS — M19012 Primary osteoarthritis, left shoulder: Secondary | ICD-10-CM | POA: Diagnosis not present

## 2022-02-06 DIAGNOSIS — E119 Type 2 diabetes mellitus without complications: Secondary | ICD-10-CM | POA: Diagnosis not present

## 2022-02-06 DIAGNOSIS — M4804 Spinal stenosis, thoracic region: Secondary | ICD-10-CM | POA: Diagnosis not present

## 2022-02-06 DIAGNOSIS — M4805 Spinal stenosis, thoracolumbar region: Secondary | ICD-10-CM | POA: Diagnosis not present

## 2022-02-06 DIAGNOSIS — Z7984 Long term (current) use of oral hypoglycemic drugs: Secondary | ICD-10-CM | POA: Diagnosis not present

## 2022-02-06 DIAGNOSIS — I081 Rheumatic disorders of both mitral and tricuspid valves: Secondary | ICD-10-CM | POA: Diagnosis not present

## 2022-02-07 DIAGNOSIS — A419 Sepsis, unspecified organism: Secondary | ICD-10-CM | POA: Diagnosis not present

## 2022-02-09 DIAGNOSIS — A4101 Sepsis due to Methicillin susceptible Staphylococcus aureus: Secondary | ICD-10-CM | POA: Diagnosis not present

## 2022-02-09 DIAGNOSIS — G062 Extradural and subdural abscess, unspecified: Secondary | ICD-10-CM | POA: Diagnosis not present

## 2022-02-09 DIAGNOSIS — E119 Type 2 diabetes mellitus without complications: Secondary | ICD-10-CM | POA: Diagnosis not present

## 2022-02-09 DIAGNOSIS — M4624 Osteomyelitis of vertebra, thoracic region: Secondary | ICD-10-CM | POA: Diagnosis not present

## 2022-02-10 DIAGNOSIS — A4101 Sepsis due to Methicillin susceptible Staphylococcus aureus: Secondary | ICD-10-CM | POA: Diagnosis not present

## 2022-02-12 DIAGNOSIS — Z23 Encounter for immunization: Secondary | ICD-10-CM | POA: Diagnosis not present

## 2022-02-12 DIAGNOSIS — I700 Atherosclerosis of aorta: Secondary | ICD-10-CM | POA: Diagnosis not present

## 2022-02-12 DIAGNOSIS — R5383 Other fatigue: Secondary | ICD-10-CM | POA: Diagnosis not present

## 2022-02-12 DIAGNOSIS — E782 Mixed hyperlipidemia: Secondary | ICD-10-CM | POA: Diagnosis not present

## 2022-02-12 DIAGNOSIS — N182 Chronic kidney disease, stage 2 (mild): Secondary | ICD-10-CM | POA: Diagnosis not present

## 2022-02-12 DIAGNOSIS — Z0000 Encounter for general adult medical examination without abnormal findings: Secondary | ICD-10-CM | POA: Diagnosis not present

## 2022-02-12 DIAGNOSIS — E1142 Type 2 diabetes mellitus with diabetic polyneuropathy: Secondary | ICD-10-CM | POA: Diagnosis not present

## 2022-02-14 DIAGNOSIS — A419 Sepsis, unspecified organism: Secondary | ICD-10-CM | POA: Diagnosis not present

## 2022-02-16 DIAGNOSIS — Z792 Long term (current) use of antibiotics: Secondary | ICD-10-CM | POA: Diagnosis not present

## 2022-02-16 DIAGNOSIS — Z5181 Encounter for therapeutic drug level monitoring: Secondary | ICD-10-CM | POA: Diagnosis not present

## 2022-02-16 DIAGNOSIS — A4101 Sepsis due to Methicillin susceptible Staphylococcus aureus: Secondary | ICD-10-CM | POA: Diagnosis not present

## 2022-02-17 LAB — CULTURE, FUNGUS WITHOUT SMEAR

## 2022-02-19 DIAGNOSIS — Z0000 Encounter for general adult medical examination without abnormal findings: Secondary | ICD-10-CM | POA: Diagnosis not present

## 2022-02-19 DIAGNOSIS — I700 Atherosclerosis of aorta: Secondary | ICD-10-CM | POA: Diagnosis not present

## 2022-02-19 DIAGNOSIS — J432 Centrilobular emphysema: Secondary | ICD-10-CM | POA: Diagnosis not present

## 2022-02-19 DIAGNOSIS — E782 Mixed hyperlipidemia: Secondary | ICD-10-CM | POA: Diagnosis not present

## 2022-02-19 DIAGNOSIS — N182 Chronic kidney disease, stage 2 (mild): Secondary | ICD-10-CM | POA: Diagnosis not present

## 2022-02-19 DIAGNOSIS — I10 Essential (primary) hypertension: Secondary | ICD-10-CM | POA: Diagnosis not present

## 2022-02-19 DIAGNOSIS — E1142 Type 2 diabetes mellitus with diabetic polyneuropathy: Secondary | ICD-10-CM | POA: Diagnosis not present

## 2022-02-19 DIAGNOSIS — M0609 Rheumatoid arthritis without rheumatoid factor, multiple sites: Secondary | ICD-10-CM | POA: Diagnosis not present

## 2022-02-20 ENCOUNTER — Encounter: Payer: Self-pay | Admitting: Infectious Diseases

## 2022-02-20 ENCOUNTER — Ambulatory Visit (INDEPENDENT_AMBULATORY_CARE_PROVIDER_SITE_OTHER): Payer: HMO | Admitting: Infectious Diseases

## 2022-02-20 ENCOUNTER — Other Ambulatory Visit: Payer: Self-pay

## 2022-02-20 ENCOUNTER — Inpatient Hospital Stay: Payer: HMO | Admitting: Infectious Diseases

## 2022-02-20 VITALS — BP 142/82 | HR 94 | Temp 97.5°F

## 2022-02-20 DIAGNOSIS — M4644 Discitis, unspecified, thoracic region: Secondary | ICD-10-CM

## 2022-02-20 DIAGNOSIS — E782 Mixed hyperlipidemia: Secondary | ICD-10-CM | POA: Diagnosis not present

## 2022-02-20 DIAGNOSIS — K219 Gastro-esophageal reflux disease without esophagitis: Secondary | ICD-10-CM | POA: Diagnosis not present

## 2022-02-20 DIAGNOSIS — Z5181 Encounter for therapeutic drug level monitoring: Secondary | ICD-10-CM | POA: Diagnosis not present

## 2022-02-20 DIAGNOSIS — M48062 Spinal stenosis, lumbar region with neurogenic claudication: Secondary | ICD-10-CM | POA: Diagnosis not present

## 2022-02-20 DIAGNOSIS — Z452 Encounter for adjustment and management of vascular access device: Secondary | ICD-10-CM | POA: Diagnosis not present

## 2022-02-20 DIAGNOSIS — M4620 Osteomyelitis of vertebra, site unspecified: Secondary | ICD-10-CM | POA: Diagnosis not present

## 2022-02-20 DIAGNOSIS — N182 Chronic kidney disease, stage 2 (mild): Secondary | ICD-10-CM | POA: Diagnosis not present

## 2022-02-20 DIAGNOSIS — G062 Extradural and subdural abscess, unspecified: Secondary | ICD-10-CM

## 2022-02-20 DIAGNOSIS — D849 Immunodeficiency, unspecified: Secondary | ICD-10-CM | POA: Diagnosis not present

## 2022-02-20 DIAGNOSIS — I129 Hypertensive chronic kidney disease with stage 1 through stage 4 chronic kidney disease, or unspecified chronic kidney disease: Secondary | ICD-10-CM | POA: Diagnosis not present

## 2022-02-20 NOTE — Progress Notes (Signed)
? ?  ? ? ?Patient Active Problem List  ? Diagnosis Date Noted  ? Medication monitoring encounter   ? 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 Acute urinary retention 01/28/2022  ? Hypokalemia 01/28/2022  ? Rheumatoid arthritis ([place]) 01/27/2022  ? Hyperlipidemia 01/27/2022  ? Immunocompromised ([place])   ? Sepsis ([place]) discitis/osteomyelitis, epidural phlegmon 01/26/2022  ? Discitis 01/26/2022  ? Centrilobular emphysema ([place]) 11/04/2021  ? Chronic kidney disease, stage 2 (mild) 11/04/2021  ? Acute exacerbation of chronic obstructive airways disease ([place]) 11/04/2021  ? Primary osteoarthritis of left hip 07/06/2018  ? Osteoarthritis of left hip 05/18/2018  ? Osteoarthritis of right hip 05/17/2018  ? Lumbar stenosis with neurogenic claudication 01/31/2018  ? HTN (hypertension) 09/03/2016  ? Diabetes ([place]) 09/03/2016  ? Radiculopathy 12/11/2015  ? ? ?Patient's Medications  ?New Prescriptions  ? No medications on file  ?Previous Medications  ? ACETAMINOPHEN (TYLENOL) 500 MG TABLET    Take 1,000 mg by mouth daily as needed for moderate pain or headache.  ? ALBUTEROL (ACCUNEB) 1.25 MG/3ML NEBULIZER SOLUTION    Take 1 ampule by nebulization every 4 (four) hours as needed for wheezing.  ? AMOXICILLIN (AMOXIL) 500 MG CAPSULE    Take 2,000 mg by mouth See admin instructions. 1 hour prior to dental appointment  ? BLOOD GLUCOSE MONITORING SUPPL (ONE TOUCH ULTRA 2) W/DEVICE KIT    Check blood sugar twice per week  ? BUMETANIDE (BUMEX) 1 MG TABLET    Take 1 mg by mouth daily.  ? CEFAZOLIN (ANCEF) IVPB    Inject 2 g into the vein every 8 (eight) hours. Indication:  MSSA Thoracic Discitis ?First Dose: Yes ?Last Day of Therapy: 03/24/2022 ?Labs - Once weekly:  CBC/D and BMP, ?Labs - Every other week:  ESR and CRP ?Method of administration: IV Push ?Method of administration may be changed at the discretion of home infusion pharmacist based upon assessment of the patient and/or caregiver's ability to self-administer the medication ordered.  ?  CETIRIZINE (ZYRTEC) 10 MG TABLET    Take 10 mg by mouth daily as needed for allergies (during Spring/Summer).  ? CHOLECALCIFEROL (VITAMIN D3) 25 MCG (1000 UNIT) TABLET    Take 1,000 Units by mouth daily.  ? DICLOFENAC (VOLTAREN) 75 MG EC TABLET    Take 1 tablet by mouth daily as needed for mild pain.  ? DICLOFENAC SODIUM (VOLTAREN) 1 % GEL    Apply 4 g topically 4 (four) times daily.  ? DILTIAZEM (CARDIZEM CD) 180 MG 24 HR CAPSULE    Take 180 mg by mouth daily.  ? DOCUSATE SODIUM (COLACE) 100 MG CAPSULE    Take 100 mg by mouth daily as needed for mild constipation.  ? ENALAPRIL (VASOTEC) 20 MG TABLET    Take 20 mg by mouth 2 (two) times daily.  ? FLUTICASONE (FLONASE) 50 MCG/ACT NASAL SPRAY    Place 1 spray into both nostrils daily.  ? FOLIC ACID (FOLVITE) 623 MCG TABLET    Take 800 mcg by mouth daily.  ? GABAPENTIN (NEURONTIN) 300 MG CAPSULE    Take 300 mg by mouth at bedtime.  ? GLUCOSE BLOOD TEST STRIP      ? LANCETS (ONETOUCH DELICA PLUS [id number]) MISC    Use to check blood sugar  ? LOVASTATIN (MEVACOR) 40 MG TABLET    Take 40 mg by mouth at bedtime.   ? MECLIZINE (ANTIVERT) 12.5 MG TABLET    Take 12.5 mg by mouth 2 (two) times daily as needed for dizziness.  ?  METFORMIN (GLUCOPHAGE-XR) 500 MG 24 HR TABLET    Take 500 mg by mouth in the morning and at bedtime.  ? METHOCARBAMOL (ROBAXIN) 500 MG TABLET    Take 1 tablet (500 mg total) by mouth 2 (two) times daily.  ? MULTIPLE VITAMIN (MULTIVITAMIN WITH MINERALS) TABS TABLET    Take 1 tablet by mouth daily.  ? OMEPRAZOLE (PRILOSEC OTC) 20 MG TABLET    Take 20 mg by mouth daily as needed (heartburn).  ? ONETOUCH ULTRA TEST STRIP      ? SODIUM CHLORIDE (OCEAN) 0.65 % SOLN NASAL SPRAY    Place 1-2 sprays into the nose 4 (four) times daily as needed for congestion.  ? TRELEGY ELLIPTA 200-62.5-25 MCG/ACT AEPB    Take 1 puff by mouth daily.  ?Modified Medications  ? No medications on file  ?Discontinued Medications  ? No medications on file  ? ? ?Subjective: ?Here for fu  for thoracic epidural abscess/discitis and osteomyelitis. She is getting IV cefazolin from her rt arm PICC without any issues. Denies any nausea, vomiting and diarrhea. Denies fevers, chills and sweats. ?Back pain has significantly improved, mostly at 1-2/10, gets 6/10 at night time. She does not have back pain now. Strength in her lower extremities has significantly improved and she is able to walk with the help of walker. Her daughter states she feels wobbly when she walks. Discussed about following up with Neurosurgery Dr Arnoldo Morale about the lower extremity weakness  ? ?Review of Systems: ?ROS all systems reviewed and negative except as stated above ? ?Past Medical History:  ?Diagnosis Date  ? Arthritis   ? "knees, ankles, fingers" (07/06/2018)  ? Asthma   ? Chronic lower back pain   ? Dyspnea   ? with asthma attacks   ? Dysrhythmia   ? Family history of adverse reaction to anesthesia   ? my first cousin had difficulty waking up   ? Fibroid   ? ovarian  ? GERD (gastroesophageal reflux disease)   ? Hip osteoarthritis   ? Left  ? History of staph infection   ? in hospital for 11 days/ in 2007  ? Hyperlipidemia   ? Hypertension   ? Radiculopathy   ? Type II diabetes mellitus ([place])   ? Wears glasses   ? ?Past Surgical History:  ?Procedure Laterality Date  ? ANTERIOR CERVICAL DECOMP/DISCECTOMY FUSION N/A 12/11/2015  ? Procedure: ANTERIOR CERVICAL DECOMPRESSION/DISCECTOMY FUSION 2 LEVELS;  Surgeon: Phylliss Bob, MD;  Location: [facility];  Service: Orthopedics;  Laterality: N/A;  Anterior cervical decompression fusion, cervical 6-7, cervical 7-thoracic 1 with instrumentation and allograft  ? BACK SURGERY    ? CARDIAC CATHETERIZATION  1998  ? CATARACT EXTRACTION W/ INTRAOCULAR LENS  IMPLANT, BILATERAL Bilateral 2015  ? Bil  ? COLONOSCOPY    ? CRYOABLATION  1988  ? 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 INCISION AND DRAINAGE Bilateral 2007>  ? "cleaned staph out of shoulders"  ? IR FLUORO GUIDED NEEDLE PLC  ASPIRATION/INJECTION LOC  01/27/2022  ? IR FLUORO GUIDED NEEDLE PLC ASPIRATION/INJECTION LOC  01/27/2022  ? JOINT REPLACEMENT    ? [place]  ? LUMBAR LAMINECTOMY/DECOMPRESSION MICRODISCECTOMY N/A 01/31/2018  ? Procedure: LAMINECTOMY AND FORAMINOTOMY LUMBAR TWO- LUMBAR THREE, LUMBAR THREE- LUMBAR FOUR, LUMBAR FOUR- LUMBAR FIVE ;  Surgeon: Newman Pies, MD;  Location: [facility];  Service: Neurosurgery;  Laterality: N/A;  ? POSTERIOR CERVICAL LAMINECTOMY  ~ 1999  ? SHOULDER OPEN ROTATOR CUFF REPAIR  Right 02/2003  ? THUMB FUSION Right 2010  ? right thumb  ? TONSILLECTOMY    ? TOTAL ABDOMINAL HYSTERECTOMY  1998  ? TAH,BSO  ? TOTAL HIP ARTHROPLASTY Right 05/18/2018  ? Procedure: TOTAL HIP ARTHROPLASTY ANTERIOR APPROACH;  Surgeon: Frederik Pear, MD;  Location: [facility];  Service: Orthopedics;  Laterality: Right;  ? TOTAL HIP ARTHROPLASTY Left 07/06/2018  ? TOTAL HIP ARTHROPLASTY Left 07/06/2018  ? Procedure: LEFT TOTAL HIP ARTHROPLASTY ANTERIOR APPROACH;  Surgeon: Frederik Pear, MD;  Location: [facility];  Service: Orthopedics;  Laterality: Left;  Needs RNFA  ? TUBAL LIGATION    ? ? ?Social History  ? ?Tobacco Use  ? Smoking status: Former  ?  Packs/day: 1.00  ?  Years: 25.00  ?  Pack years: 25.00  ?  Types: Cigarettes  ?  Quit date: 11/23/1992  ?  Years since quitting: 29.2  ? Smokeless tobacco: Never  ?Vaping Use  ? Vaping Use: Never used  ?Substance Use Topics  ? Alcohol use: Not Currently  ? Drug use: Never  ? ? ?Family History  ?Problem Relation Age of Onset  ? Diabetes Sister   ? Diabetes Brother   ? Diabetes Brother   ? Diabetes Paternal Uncle   ? Cancer Paternal Aunt   ?     UTERINE  ? Hypertension Maternal Grandmother   ? Heart disease Maternal Grandmother   ? Colon cancer Neg Hx   ? ? ?Allergies  ?Allergen Reactions  ? Ivp Dye [Iodinated Contrast Media] Nausea And Vomiting  ? Prednisone Other (See Comments)  ?  reflux ?Other reaction(s): acid reflux ?Other reaction(s): acid reflux ?Other reaction(s):  acid reflux  ? ? ?Health Maintenance  ?Topic Date Due  ? Pneumonia Vaccine 49+ Years old (1 - PCV) Never done  ? OPHTHALMOLOGY EXAM  Never done  ? Zoster Vaccines- Shingrix (1 of 2) Never done  ? MAMMOGRAM  07

## 2022-02-21 DIAGNOSIS — A419 Sepsis, unspecified organism: Secondary | ICD-10-CM | POA: Diagnosis not present

## 2022-02-23 ENCOUNTER — Ambulatory Visit: Payer: HMO | Admitting: Podiatry

## 2022-02-23 DIAGNOSIS — M19012 Primary osteoarthritis, left shoulder: Secondary | ICD-10-CM | POA: Diagnosis not present

## 2022-02-23 DIAGNOSIS — E785 Hyperlipidemia, unspecified: Secondary | ICD-10-CM | POA: Diagnosis not present

## 2022-02-23 DIAGNOSIS — Z452 Encounter for adjustment and management of vascular access device: Secondary | ICD-10-CM | POA: Diagnosis not present

## 2022-02-23 DIAGNOSIS — Z7984 Long term (current) use of oral hypoglycemic drugs: Secondary | ICD-10-CM | POA: Diagnosis not present

## 2022-02-23 DIAGNOSIS — M4804 Spinal stenosis, thoracic region: Secondary | ICD-10-CM | POA: Diagnosis not present

## 2022-02-23 DIAGNOSIS — M5134 Other intervertebral disc degeneration, thoracic region: Secondary | ICD-10-CM | POA: Diagnosis not present

## 2022-02-23 DIAGNOSIS — I700 Atherosclerosis of aorta: Secondary | ICD-10-CM | POA: Diagnosis not present

## 2022-02-23 DIAGNOSIS — J432 Centrilobular emphysema: Secondary | ICD-10-CM | POA: Diagnosis not present

## 2022-02-23 DIAGNOSIS — E119 Type 2 diabetes mellitus without complications: Secondary | ICD-10-CM | POA: Diagnosis not present

## 2022-02-23 DIAGNOSIS — Z5181 Encounter for therapeutic drug level monitoring: Secondary | ICD-10-CM | POA: Diagnosis not present

## 2022-02-23 DIAGNOSIS — M47814 Spondylosis without myelopathy or radiculopathy, thoracic region: Secondary | ICD-10-CM | POA: Diagnosis not present

## 2022-02-23 DIAGNOSIS — G062 Extradural and subdural abscess, unspecified: Secondary | ICD-10-CM | POA: Diagnosis not present

## 2022-02-23 DIAGNOSIS — Z79899 Other long term (current) drug therapy: Secondary | ICD-10-CM | POA: Diagnosis not present

## 2022-02-23 DIAGNOSIS — I119 Hypertensive heart disease without heart failure: Secondary | ICD-10-CM | POA: Diagnosis not present

## 2022-02-23 DIAGNOSIS — M48061 Spinal stenosis, lumbar region without neurogenic claudication: Secondary | ICD-10-CM | POA: Diagnosis not present

## 2022-02-23 DIAGNOSIS — M069 Rheumatoid arthritis, unspecified: Secondary | ICD-10-CM | POA: Diagnosis not present

## 2022-02-23 DIAGNOSIS — M4805 Spinal stenosis, thoracolumbar region: Secondary | ICD-10-CM | POA: Diagnosis not present

## 2022-02-23 DIAGNOSIS — M4316 Spondylolisthesis, lumbar region: Secondary | ICD-10-CM | POA: Diagnosis not present

## 2022-02-23 DIAGNOSIS — I081 Rheumatic disorders of both mitral and tricuspid valves: Secondary | ICD-10-CM | POA: Diagnosis not present

## 2022-02-23 DIAGNOSIS — M4726 Other spondylosis with radiculopathy, lumbar region: Secondary | ICD-10-CM | POA: Diagnosis not present

## 2022-02-23 DIAGNOSIS — M4624 Osteomyelitis of vertebra, thoracic region: Secondary | ICD-10-CM | POA: Diagnosis not present

## 2022-02-23 DIAGNOSIS — Z792 Long term (current) use of antibiotics: Secondary | ICD-10-CM | POA: Diagnosis not present

## 2022-02-23 DIAGNOSIS — M4644 Discitis, unspecified, thoracic region: Secondary | ICD-10-CM | POA: Diagnosis not present

## 2022-02-23 DIAGNOSIS — M5136 Other intervertebral disc degeneration, lumbar region: Secondary | ICD-10-CM | POA: Diagnosis not present

## 2022-02-23 DIAGNOSIS — A4101 Sepsis due to Methicillin susceptible Staphylococcus aureus: Secondary | ICD-10-CM | POA: Diagnosis not present

## 2022-02-24 ENCOUNTER — Ambulatory Visit (INDEPENDENT_AMBULATORY_CARE_PROVIDER_SITE_OTHER): Payer: HMO | Admitting: Podiatry

## 2022-02-24 DIAGNOSIS — Z91199 Patient's noncompliance with other medical treatment and regimen due to unspecified reason: Secondary | ICD-10-CM

## 2022-02-25 ENCOUNTER — Ambulatory Visit (INDEPENDENT_AMBULATORY_CARE_PROVIDER_SITE_OTHER): Payer: HMO | Admitting: Podiatry

## 2022-02-25 ENCOUNTER — Encounter: Payer: Self-pay | Admitting: Podiatry

## 2022-02-25 DIAGNOSIS — M79675 Pain in left toe(s): Secondary | ICD-10-CM | POA: Diagnosis not present

## 2022-02-25 DIAGNOSIS — E119 Type 2 diabetes mellitus without complications: Secondary | ICD-10-CM

## 2022-02-25 DIAGNOSIS — M79674 Pain in right toe(s): Secondary | ICD-10-CM | POA: Diagnosis not present

## 2022-02-25 DIAGNOSIS — B351 Tinea unguium: Secondary | ICD-10-CM

## 2022-02-25 NOTE — Progress Notes (Signed)
This patient returns to my office for at risk foot care.  This patient requires this care by a professional since this patient will be at risk due to having diabetes.  This patient is unable to cut nails herself since the patient cannot reach her nails.These nails are painful walking and wearing shoes.  This patient presents for at risk foot care today. ? ?General Appearance  Alert, conversant and in no acute stress. ? ?Vascular  Dorsalis pedis and posterior tibial  pulses are palpable  bilaterally.  Capillary return is within normal limits  bilaterally. Temperature is within normal limits  bilaterally. ? ?Neurologic  Senn-Weinstein monofilament wire test within normal limits  bilaterally. Muscle power within normal limits bilaterally. ? ?Nails Thick disfigured discolored nails with subungual debris  from hallux to fifth toes bilaterally. No evidence of bacterial infection or drainage bilaterally. ? ?Orthopedic  No limitations of motion  feet .  No crepitus or effusions noted.  No bony pathology or digital deformities noted. Asymptomatic HD 5th left. ? ?Skin  normotropic skin with no porokeratosis noted bilaterally.  No signs of infections or ulcers noted.    ? ?Onychomycosis  Pain in right toes  Pain in left toes ? ?Consent was obtained for treatment procedures.   Mechanical debridement of nails 1-5  bilaterally performed with a nail nipper.  Filed with dremel without incident.  ? ? ?Return office visit     3 months                 Told patient to return for periodic foot care and evaluation due to potential at risk complications. ? ? ?Gardiner Barefoot DPM   ?

## 2022-02-28 DIAGNOSIS — A419 Sepsis, unspecified organism: Secondary | ICD-10-CM | POA: Diagnosis not present

## 2022-03-01 NOTE — Progress Notes (Signed)
No show for appointment. Rescheduled. ?

## 2022-03-03 ENCOUNTER — Inpatient Hospital Stay: Payer: HMO | Admitting: Infectious Diseases

## 2022-03-03 DIAGNOSIS — A4101 Sepsis due to Methicillin susceptible Staphylococcus aureus: Secondary | ICD-10-CM | POA: Diagnosis not present

## 2022-03-04 ENCOUNTER — Telehealth: Payer: Self-pay | Admitting: Pharmacist

## 2022-03-04 NOTE — Telephone Encounter (Signed)
Patient called today expressing concerns for new symptoms while receiving Ancef. She has been receiving the medicine since early March without any issues. Today she is experiencing dry mouth, less appetite, and "shaking hands". Reviewed that dry mouth is likely from allergies and not associated with Ancef. She said her appetite has been lower over the last year or so likely due to changes in age and other factors. She said the shaking hands is only when she is holding something. Discussed that this also may be related to age and is of less concern with Ancef particularly since she has been on for over a month. ? ?Passing along as an FYI. Thanks! ? ?Alfonse Spruce, PharmD, CPP ?Clinical Pharmacist Practitioner ?Infectious Diseases Clinical Pharmacist ?[facility] ? ? ?

## 2022-03-07 DIAGNOSIS — A419 Sepsis, unspecified organism: Secondary | ICD-10-CM | POA: Diagnosis not present

## 2022-03-09 DIAGNOSIS — I700 Atherosclerosis of aorta: Secondary | ICD-10-CM | POA: Diagnosis not present

## 2022-03-09 DIAGNOSIS — I081 Rheumatic disorders of both mitral and tricuspid valves: Secondary | ICD-10-CM | POA: Diagnosis not present

## 2022-03-09 DIAGNOSIS — A4101 Sepsis due to Methicillin susceptible Staphylococcus aureus: Secondary | ICD-10-CM | POA: Diagnosis not present

## 2022-03-09 DIAGNOSIS — E119 Type 2 diabetes mellitus without complications: Secondary | ICD-10-CM | POA: Diagnosis not present

## 2022-03-09 DIAGNOSIS — G062 Extradural and subdural abscess, unspecified: Secondary | ICD-10-CM | POA: Diagnosis not present

## 2022-03-09 DIAGNOSIS — M069 Rheumatoid arthritis, unspecified: Secondary | ICD-10-CM | POA: Diagnosis not present

## 2022-03-09 DIAGNOSIS — M5134 Other intervertebral disc degeneration, thoracic region: Secondary | ICD-10-CM | POA: Diagnosis not present

## 2022-03-09 DIAGNOSIS — M48061 Spinal stenosis, lumbar region without neurogenic claudication: Secondary | ICD-10-CM | POA: Diagnosis not present

## 2022-03-09 DIAGNOSIS — M4644 Discitis, unspecified, thoracic region: Secondary | ICD-10-CM | POA: Diagnosis not present

## 2022-03-09 DIAGNOSIS — J432 Centrilobular emphysema: Secondary | ICD-10-CM | POA: Diagnosis not present

## 2022-03-09 DIAGNOSIS — M4624 Osteomyelitis of vertebra, thoracic region: Secondary | ICD-10-CM | POA: Diagnosis not present

## 2022-03-09 DIAGNOSIS — Z79899 Other long term (current) drug therapy: Secondary | ICD-10-CM | POA: Diagnosis not present

## 2022-03-09 DIAGNOSIS — M47814 Spondylosis without myelopathy or radiculopathy, thoracic region: Secondary | ICD-10-CM | POA: Diagnosis not present

## 2022-03-09 DIAGNOSIS — Z792 Long term (current) use of antibiotics: Secondary | ICD-10-CM | POA: Diagnosis not present

## 2022-03-09 DIAGNOSIS — N390 Urinary tract infection, site not specified: Secondary | ICD-10-CM | POA: Diagnosis not present

## 2022-03-09 DIAGNOSIS — I119 Hypertensive heart disease without heart failure: Secondary | ICD-10-CM | POA: Diagnosis not present

## 2022-03-09 DIAGNOSIS — Z452 Encounter for adjustment and management of vascular access device: Secondary | ICD-10-CM | POA: Diagnosis not present

## 2022-03-09 DIAGNOSIS — M4316 Spondylolisthesis, lumbar region: Secondary | ICD-10-CM | POA: Diagnosis not present

## 2022-03-09 DIAGNOSIS — Z5181 Encounter for therapeutic drug level monitoring: Secondary | ICD-10-CM | POA: Diagnosis not present

## 2022-03-09 DIAGNOSIS — Z7984 Long term (current) use of oral hypoglycemic drugs: Secondary | ICD-10-CM | POA: Diagnosis not present

## 2022-03-09 DIAGNOSIS — E785 Hyperlipidemia, unspecified: Secondary | ICD-10-CM | POA: Diagnosis not present

## 2022-03-09 DIAGNOSIS — M4805 Spinal stenosis, thoracolumbar region: Secondary | ICD-10-CM | POA: Diagnosis not present

## 2022-03-09 DIAGNOSIS — R399 Unspecified symptoms and signs involving the genitourinary system: Secondary | ICD-10-CM | POA: Diagnosis not present

## 2022-03-09 DIAGNOSIS — M5136 Other intervertebral disc degeneration, lumbar region: Secondary | ICD-10-CM | POA: Diagnosis not present

## 2022-03-09 DIAGNOSIS — M19012 Primary osteoarthritis, left shoulder: Secondary | ICD-10-CM | POA: Diagnosis not present

## 2022-03-09 DIAGNOSIS — M4726 Other spondylosis with radiculopathy, lumbar region: Secondary | ICD-10-CM | POA: Diagnosis not present

## 2022-03-09 DIAGNOSIS — M4804 Spinal stenosis, thoracic region: Secondary | ICD-10-CM | POA: Diagnosis not present

## 2022-03-13 LAB — ACID FAST CULTURE WITH REFLEXED SENSITIVITIES (MYCOBACTERIA): Acid Fast Culture: NEGATIVE

## 2022-03-14 DIAGNOSIS — A419 Sepsis, unspecified organism: Secondary | ICD-10-CM | POA: Diagnosis not present

## 2022-03-16 DIAGNOSIS — A4101 Sepsis due to Methicillin susceptible Staphylococcus aureus: Secondary | ICD-10-CM | POA: Diagnosis not present

## 2022-03-16 DIAGNOSIS — Z5181 Encounter for therapeutic drug level monitoring: Secondary | ICD-10-CM | POA: Diagnosis not present

## 2022-03-16 DIAGNOSIS — Z792 Long term (current) use of antibiotics: Secondary | ICD-10-CM | POA: Diagnosis not present

## 2022-03-18 ENCOUNTER — Ambulatory Visit (INDEPENDENT_AMBULATORY_CARE_PROVIDER_SITE_OTHER): Payer: HMO | Admitting: Infectious Diseases

## 2022-03-18 ENCOUNTER — Telehealth: Payer: Self-pay

## 2022-03-18 ENCOUNTER — Encounter: Payer: Self-pay | Admitting: Infectious Diseases

## 2022-03-18 ENCOUNTER — Other Ambulatory Visit: Payer: Self-pay

## 2022-03-18 VITALS — BP 127/71 | HR 96 | Temp 98.6°F | Wt 137.0 lb

## 2022-03-18 DIAGNOSIS — M4620 Osteomyelitis of vertebra, site unspecified: Secondary | ICD-10-CM

## 2022-03-18 DIAGNOSIS — Z5181 Encounter for therapeutic drug level monitoring: Secondary | ICD-10-CM

## 2022-03-18 DIAGNOSIS — M4644 Discitis, unspecified, thoracic region: Secondary | ICD-10-CM | POA: Diagnosis not present

## 2022-03-18 DIAGNOSIS — Z452 Encounter for adjustment and management of vascular access device: Secondary | ICD-10-CM

## 2022-03-18 DIAGNOSIS — G062 Extradural and subdural abscess, unspecified: Secondary | ICD-10-CM

## 2022-03-18 NOTE — Progress Notes (Signed)
? ?Patient Active Problem List  ? Diagnosis Date Noted  ? PICC (peripherally inserted central catheter) in place 02/20/2022  ? Vertebral osteomyelitis ([place]) 02/20/2022  ? Medication monitoring encounter   ? 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 Acute urinary retention 01/28/2022  ? Hypokalemia 01/28/2022  ? Rheumatoid arthritis ([place]) 01/27/2022  ? Hyperlipidemia 01/27/2022  ? Immunocompromised ([place])   ? Sepsis ([place]) discitis/osteomyelitis, epidural phlegmon 01/26/2022  ? Discitis 01/26/2022  ? Centrilobular emphysema ([place]) 11/04/2021  ? Chronic kidney disease, stage 2 (mild) 11/04/2021  ? Acute exacerbation of chronic obstructive airways disease ([place]) 11/04/2021  ? Primary osteoarthritis of left hip 07/06/2018  ? Osteoarthritis of left hip 05/18/2018  ? Osteoarthritis of right hip 05/17/2018  ? Lumbar stenosis with neurogenic claudication 01/31/2018  ? HTN (hypertension) 09/03/2016  ? Diabetes ([place]) 09/03/2016  ? Radiculopathy 12/11/2015  ? ? ?Patient's Medications  ?New Prescriptions  ? No medications on file  ?Previous Medications  ? ACETAMINOPHEN (TYLENOL) 500 MG TABLET    Take 1,000 mg by mouth daily as needed for moderate pain or headache.  ? ALBUTEROL (ACCUNEB) 1.25 MG/3ML NEBULIZER SOLUTION    Take 1 ampule by nebulization every 4 (four) hours as needed for wheezing.  ? AMOXICILLIN (AMOXIL) 500 MG CAPSULE    Take 2,000 mg by mouth See admin instructions. 1 hour prior to dental appointment  ? BLOOD GLUCOSE MONITORING SUPPL (ONE TOUCH ULTRA 2) W/DEVICE KIT    Check blood sugar twice per week  ? BUMETANIDE (BUMEX) 1 MG TABLET    Take 1 mg by mouth daily.  ? CEFAZOLIN (ANCEF) IVPB    Inject 2 g into the vein every 8 (eight) hours. Indication:  MSSA Thoracic Discitis ?First Dose: Yes ?Last Day of Therapy: 03/24/2022 ?Labs - Once weekly:  CBC/D and BMP, ?Labs - Every other week:  ESR and CRP ?Method of administration: IV Push ?Method of administration may be changed at the discretion of home infusion pharmacist based upon  assessment of the patient and/or caregiver's ability to self-administer the medication ordered.  ? CETIRIZINE (ZYRTEC) 10 MG TABLET    Take 10 mg by mouth daily as needed for allergies (during Spring/Summer).  ? CHOLECALCIFEROL (VITAMIN D3) 25 MCG (1000 UNIT) TABLET    Take 1,000 Units by mouth daily.  ? DICLOFENAC (VOLTAREN) 75 MG EC TABLET    Take 1 tablet by mouth daily as needed for mild pain.  ? DICLOFENAC SODIUM (VOLTAREN) 1 % GEL    Apply 4 g topically 4 (four) times daily.  ? DILTIAZEM (CARDIZEM CD) 180 MG 24 HR CAPSULE    Take 180 mg by mouth daily.  ? DOCUSATE SODIUM (COLACE) 100 MG CAPSULE    Take 100 mg by mouth daily as needed for mild constipation.  ? ENALAPRIL (VASOTEC) 20 MG TABLET    Take 20 mg by mouth 2 (two) times daily.  ? FLUTICASONE (FLONASE) 50 MCG/ACT NASAL SPRAY    Place 1 spray into both nostrils daily.  ? FOLIC ACID (FOLVITE) 742 MCG TABLET    Take 800 mcg by mouth daily.  ? GABAPENTIN (NEURONTIN) 300 MG CAPSULE    Take 300 mg by mouth at bedtime.  ? GLUCOSE BLOOD TEST STRIP      ? LANCETS (ONETOUCH DELICA PLUS [id number]) MISC    Use to check blood sugar  ? LOVASTATIN (MEVACOR) 40 MG TABLET    Take 40 mg by mouth at bedtime.   ? MECLIZINE (ANTIVERT) 12.5 MG TABLET    Take 12.5  mg by mouth 2 (two) times daily as needed for dizziness.  ? METFORMIN (GLUCOPHAGE-XR) 500 MG 24 HR TABLET    Take 500 mg by mouth in the morning and at bedtime.  ? METHOCARBAMOL (ROBAXIN) 500 MG TABLET    Take 1 tablet (500 mg total) by mouth 2 (two) times daily.  ? MULTIPLE VITAMIN (MULTIVITAMIN WITH MINERALS) TABS TABLET    Take 1 tablet by mouth daily.  ? OMEPRAZOLE (PRILOSEC OTC) 20 MG TABLET    Take 20 mg by mouth daily as needed (heartburn).  ? ONETOUCH ULTRA TEST STRIP      ? SODIUM CHLORIDE (OCEAN) 0.65 % SOLN NASAL SPRAY    Place 1-2 sprays into the nose 4 (four) times daily as needed for congestion.  ? TRELEGY ELLIPTA 200-62.5-25 MCG/ACT AEPB    Take 1 puff by mouth daily.  ?Modified Medications  ? No  medications on file  ?Discontinued Medications  ? No medications on file  ? ? ?Subjective: ?Here for fu for thoracic epidural abscess/discitis and osteomyelitis. She is accompanied by her daughter and comes in with a rolling walker. She is getting IV cefazolin from her rt arm PICC without any issues. Denies any nausea, vomiting and diarrhea. Denies fevers, chills and sweats. Back pain is mostly 3/10 and can go up to 5/10. She takes 2 tablets of 500 mg tylenol tablets in a day. Able to walk with the help of rolling walker. Feels like getting better and  stronger each day. She has not followed up with Nuerosurgery soon but will follow up with them soon. Daughter also reportstremors in left hand for 3 weeks and asking if likely related to the abtx. However is not present in the rt hand/minimal if any in the rt hand. Discussed to see PCP/Neurology.   ? ?Review of Systems: ?ROS all systems reviewed and negative except as stated above ? ?Past Medical History:  ?Diagnosis Date  ? Arthritis   ? "knees, ankles, fingers" (07/06/2018)  ? Asthma   ? Chronic lower back pain   ? Dyspnea   ? with asthma attacks   ? Dysrhythmia   ? Family history of adverse reaction to anesthesia   ? my first cousin had difficulty waking up   ? Fibroid   ? ovarian  ? GERD (gastroesophageal reflux disease)   ? Hip osteoarthritis   ? Left  ? History of staph infection   ? in hospital for 11 days/ in 2007  ? Hyperlipidemia   ? Hypertension   ? Radiculopathy   ? Type II diabetes mellitus ([place])   ? Wears glasses   ? ?Past Surgical History:  ?Procedure Laterality Date  ? ANTERIOR CERVICAL DECOMP/DISCECTOMY FUSION N/A 12/11/2015  ? Procedure: ANTERIOR CERVICAL DECOMPRESSION/DISCECTOMY FUSION 2 LEVELS;  Surgeon: Phylliss Bob, MD;  Location: [facility];  Service: Orthopedics;  Laterality: N/A;  Anterior cervical decompression fusion, cervical 6-7, cervical 7-thoracic 1 with instrumentation and allograft  ? BACK SURGERY    ? CARDIAC CATHETERIZATION  1998  ?  CATARACT EXTRACTION W/ INTRAOCULAR LENS  IMPLANT, BILATERAL Bilateral 2015  ? Bil  ? COLONOSCOPY    ? CRYOABLATION  1988  ? 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 INCISION AND DRAINAGE Bilateral 2007>  ? "cleaned staph out of shoulders"  ? IR FLUORO GUIDED NEEDLE PLC ASPIRATION/INJECTION LOC  01/27/2022  ? IR FLUORO GUIDED NEEDLE PLC ASPIRATION/INJECTION LOC  01/27/2022  ? JOINT REPLACEMENT    ? LAPAROSCOPIC OVARIAN CYSTECTOMY  1970  ? LUMBAR LAMINECTOMY/DECOMPRESSION MICRODISCECTOMY N/A 01/31/2018  ? Procedure: LAMINECTOMY AND FORAMINOTOMY LUMBAR TWO- LUMBAR THREE, LUMBAR THREE- LUMBAR FOUR, LUMBAR FOUR- LUMBAR FIVE ;  Surgeon: Newman Pies, MD;  Location: [facility];  Service: Neurosurgery;  Laterality: N/A;  ? POSTERIOR CERVICAL LAMINECTOMY  ~ 1999  ? SHOULDER OPEN ROTATOR CUFF REPAIR Right 02/2003  ? THUMB FUSION Right 2010  ? right thumb  ? TONSILLECTOMY    ? TOTAL ABDOMINAL HYSTERECTOMY  1998  ? TAH,BSO  ? TOTAL HIP ARTHROPLASTY Right 05/18/2018  ? Procedure: TOTAL HIP ARTHROPLASTY ANTERIOR APPROACH;  Surgeon: Frederik Pear, MD;  Location: [facility];  Service: Orthopedics;  Laterality: Right;  ? TOTAL HIP ARTHROPLASTY Left 07/06/2018  ? TOTAL HIP ARTHROPLASTY Left 07/06/2018  ? Procedure: LEFT TOTAL HIP ARTHROPLASTY ANTERIOR APPROACH;  Surgeon: Frederik Pear, MD;  Location: [facility];  Service: Orthopedics;  Laterality: Left;  Needs RNFA  ? TUBAL LIGATION    ? ? ?Social History  ? ?Tobacco Use  ? Smoking status: Former  ?  Packs/day: 1.00  ?  Years: 25.00  ?  Pack years: 25.00  ?  Types: Cigarettes  ?  Quit date: 11/23/1992  ?  Years since quitting: 29.3  ? Smokeless tobacco: Never  ?Vaping Use  ? Vaping Use: Never used  ?Substance Use Topics  ? Alcohol use: Not Currently  ? Drug use: Never  ? ? ?Family History  ?Problem Relation Age of Onset  ? Diabetes Sister   ? Diabetes Brother   ? Diabetes Brother   ? Diabetes Paternal Uncle   ? Cancer Paternal Aunt   ?     UTERINE  ? Hypertension Maternal Grandmother    ? Heart disease Maternal Grandmother   ? Colon cancer Neg Hx   ? ? ?Allergies  ?Allergen Reactions  ? Ivp Dye [Iodinated Contrast Media] Nausea And Vomiting  ? Prednisone Other (See Comments)  ?  reflux ?Other

## 2022-03-18 NOTE — Telephone Encounter (Signed)
Per MD extend picc and IV antibiotics till May 9. Called [facility] and spoke with Abigail who read back orders and verbalized understanding. ?

## 2022-03-21 DIAGNOSIS — A419 Sepsis, unspecified organism: Secondary | ICD-10-CM | POA: Diagnosis not present

## 2022-03-23 DIAGNOSIS — Z792 Long term (current) use of antibiotics: Secondary | ICD-10-CM | POA: Diagnosis not present

## 2022-03-23 DIAGNOSIS — M4644 Discitis, unspecified, thoracic region: Secondary | ICD-10-CM | POA: Diagnosis not present

## 2022-03-23 DIAGNOSIS — M4726 Other spondylosis with radiculopathy, lumbar region: Secondary | ICD-10-CM | POA: Diagnosis not present

## 2022-03-23 DIAGNOSIS — I081 Rheumatic disorders of both mitral and tricuspid valves: Secondary | ICD-10-CM | POA: Diagnosis not present

## 2022-03-23 DIAGNOSIS — I119 Hypertensive heart disease without heart failure: Secondary | ICD-10-CM | POA: Diagnosis not present

## 2022-03-23 DIAGNOSIS — M4624 Osteomyelitis of vertebra, thoracic region: Secondary | ICD-10-CM | POA: Diagnosis not present

## 2022-03-23 DIAGNOSIS — G062 Extradural and subdural abscess, unspecified: Secondary | ICD-10-CM | POA: Diagnosis not present

## 2022-03-23 DIAGNOSIS — M4805 Spinal stenosis, thoracolumbar region: Secondary | ICD-10-CM | POA: Diagnosis not present

## 2022-03-23 DIAGNOSIS — J432 Centrilobular emphysema: Secondary | ICD-10-CM | POA: Diagnosis not present

## 2022-03-23 DIAGNOSIS — M47814 Spondylosis without myelopathy or radiculopathy, thoracic region: Secondary | ICD-10-CM | POA: Diagnosis not present

## 2022-03-23 DIAGNOSIS — E785 Hyperlipidemia, unspecified: Secondary | ICD-10-CM | POA: Diagnosis not present

## 2022-03-23 DIAGNOSIS — M19012 Primary osteoarthritis, left shoulder: Secondary | ICD-10-CM | POA: Diagnosis not present

## 2022-03-23 DIAGNOSIS — M48061 Spinal stenosis, lumbar region without neurogenic claudication: Secondary | ICD-10-CM | POA: Diagnosis not present

## 2022-03-23 DIAGNOSIS — Z7984 Long term (current) use of oral hypoglycemic drugs: Secondary | ICD-10-CM | POA: Diagnosis not present

## 2022-03-23 DIAGNOSIS — Z452 Encounter for adjustment and management of vascular access device: Secondary | ICD-10-CM | POA: Diagnosis not present

## 2022-03-23 DIAGNOSIS — E119 Type 2 diabetes mellitus without complications: Secondary | ICD-10-CM | POA: Diagnosis not present

## 2022-03-23 DIAGNOSIS — M069 Rheumatoid arthritis, unspecified: Secondary | ICD-10-CM | POA: Diagnosis not present

## 2022-03-23 DIAGNOSIS — M4804 Spinal stenosis, thoracic region: Secondary | ICD-10-CM | POA: Diagnosis not present

## 2022-03-23 DIAGNOSIS — M4316 Spondylolisthesis, lumbar region: Secondary | ICD-10-CM | POA: Diagnosis not present

## 2022-03-23 DIAGNOSIS — Z5181 Encounter for therapeutic drug level monitoring: Secondary | ICD-10-CM | POA: Diagnosis not present

## 2022-03-23 DIAGNOSIS — M5136 Other intervertebral disc degeneration, lumbar region: Secondary | ICD-10-CM | POA: Diagnosis not present

## 2022-03-23 DIAGNOSIS — A4101 Sepsis due to Methicillin susceptible Staphylococcus aureus: Secondary | ICD-10-CM | POA: Diagnosis not present

## 2022-03-23 DIAGNOSIS — Z79899 Other long term (current) drug therapy: Secondary | ICD-10-CM | POA: Diagnosis not present

## 2022-03-23 DIAGNOSIS — M5134 Other intervertebral disc degeneration, thoracic region: Secondary | ICD-10-CM | POA: Diagnosis not present

## 2022-03-23 DIAGNOSIS — I700 Atherosclerosis of aorta: Secondary | ICD-10-CM | POA: Diagnosis not present

## 2022-03-26 ENCOUNTER — Ambulatory Visit (HOSPITAL_COMMUNITY)
Admission: RE | Admit: 2022-03-26 | Discharge: 2022-03-26 | Disposition: A | Discharge status: Home / Self Care | Payer: HMO | Source: Ambulatory Visit | Attending: Infectious Diseases | Admitting: Infectious Diseases

## 2022-03-26 DIAGNOSIS — G062 Extradural and subdural abscess, unspecified: Secondary | ICD-10-CM | POA: Diagnosis not present

## 2022-03-26 DIAGNOSIS — M5124 Other intervertebral disc displacement, thoracic region: Secondary | ICD-10-CM | POA: Diagnosis not present

## 2022-03-26 DIAGNOSIS — M4804 Spinal stenosis, thoracic region: Secondary | ICD-10-CM | POA: Diagnosis not present

## 2022-03-26 DIAGNOSIS — M47814 Spondylosis without myelopathy or radiculopathy, thoracic region: Secondary | ICD-10-CM | POA: Diagnosis not present

## 2022-03-26 MED ORDER — GADOBUTROL 1 MMOL/ML IV SOLN
6.0000 mL | Freq: Once | INTRAVENOUS | Status: AC | PRN
  Administered 2022-03-26: 6 mL via INTRAVENOUS

## 2022-03-27 ENCOUNTER — Telehealth: Payer: Self-pay

## 2022-03-27 NOTE — Telephone Encounter (Signed)
-----   Message from Rosiland Oz, MD sent at 03/27/2022 10:48 AM EDT ----- ?Regarding: Abnormal MRI findings ?Could you please let Dr Adline Mango office know that patient had MRI spine done today with worsening findings on appropriate antibiotics/also worsening of cord compression. The daughter had called the office yesterday and has not heard back from them. She has no new neurological symptoms apart from the tremor in the left hand which she had mentioned to me in her last visit.  ? ? ? ? ?

## 2022-03-27 NOTE — Telephone Encounter (Signed)
Left voicemail with Dr. Arnoldo Morale CMA [facility]) with provider's message.  ?Patient's daughter also called office today and requested a copy of MRI results be faxed to Dr. Arnoldo Morale to be reviewed. Requested they call them back today.  ?Leatrice Jewels, RMA  ?

## 2022-03-28 DIAGNOSIS — A419 Sepsis, unspecified organism: Secondary | ICD-10-CM | POA: Diagnosis not present

## 2022-03-30 ENCOUNTER — Telehealth: Payer: Self-pay

## 2022-03-30 ENCOUNTER — Ambulatory Visit (INDEPENDENT_AMBULATORY_CARE_PROVIDER_SITE_OTHER): Payer: HMO | Admitting: Infectious Disease

## 2022-03-30 ENCOUNTER — Other Ambulatory Visit: Payer: Self-pay

## 2022-03-30 ENCOUNTER — Other Ambulatory Visit: Payer: Self-pay | Admitting: Neurosurgery

## 2022-03-30 ENCOUNTER — Encounter: Payer: Self-pay | Admitting: Infectious Disease

## 2022-03-30 ENCOUNTER — Other Ambulatory Visit (HOSPITAL_COMMUNITY): Payer: Self-pay | Admitting: Neurosurgery

## 2022-03-30 VITALS — BP 135/61 | HR 99 | Temp 98.2°F | Wt 137.0 lb

## 2022-03-30 DIAGNOSIS — A4901 Methicillin susceptible Staphylococcus aureus infection, unspecified site: Secondary | ICD-10-CM

## 2022-03-30 DIAGNOSIS — R251 Tremor, unspecified: Secondary | ICD-10-CM | POA: Diagnosis not present

## 2022-03-30 DIAGNOSIS — G062 Extradural and subdural abscess, unspecified: Secondary | ICD-10-CM

## 2022-03-30 DIAGNOSIS — M4624 Osteomyelitis of vertebra, thoracic region: Secondary | ICD-10-CM

## 2022-03-30 DIAGNOSIS — M542 Cervicalgia: Secondary | ICD-10-CM | POA: Diagnosis not present

## 2022-03-30 DIAGNOSIS — R29898 Other symptoms and signs involving the musculoskeletal system: Secondary | ICD-10-CM | POA: Insufficient documentation

## 2022-03-30 DIAGNOSIS — M069 Rheumatoid arthritis, unspecified: Secondary | ICD-10-CM | POA: Diagnosis not present

## 2022-03-30 DIAGNOSIS — A4101 Sepsis due to Methicillin susceptible Staphylococcus aureus: Secondary | ICD-10-CM | POA: Diagnosis not present

## 2022-03-30 DIAGNOSIS — Z792 Long term (current) use of antibiotics: Secondary | ICD-10-CM | POA: Diagnosis not present

## 2022-03-30 DIAGNOSIS — M4644 Discitis, unspecified, thoracic region: Secondary | ICD-10-CM | POA: Diagnosis not present

## 2022-03-30 DIAGNOSIS — Z5181 Encounter for therapeutic drug level monitoring: Secondary | ICD-10-CM | POA: Diagnosis not present

## 2022-03-30 DIAGNOSIS — Z6830 Body mass index (BMI) 30.0-30.9, adult: Secondary | ICD-10-CM | POA: Diagnosis not present

## 2022-03-30 HISTORY — DX: Other symptoms and signs involving the musculoskeletal system: R29.898

## 2022-03-30 HISTORY — DX: Tremor, unspecified: R25.1

## 2022-03-30 NOTE — Telephone Encounter (Signed)
Per Dr. Tommy Medal reached out to [facility] with orders to extend Iv antibiotics x6 weeks. Orders sent to [facility] team.  ?Leatrice Jewels, RMA  ?

## 2022-03-30 NOTE — Telephone Encounter (Signed)
Spoke to the patient and scheduled her for an appointment today. She will also see Dr. Arnoldo Morale today ?

## 2022-03-30 NOTE — Progress Notes (Signed)
? ?Subjective:  ? ?Chief complaint: New onset upper extremity tremors and left-sided weakness ? ? Patient ID: [name], female    DOB: July 06, 1946, 76 y.o.   MRN: [id number] ? ?HPI ? ?76 year old black woman with a past medical history significant for rheumatoid arthritis having been on etanercept and methotrexate diabetes mellitus hypertension hyperlipidemia who has had bilateral hip replacements lumbar laminectomy decompression and microdiscectomy in 2019 prior bilateral septic shoulders and MSSA bacteremia treated in 2012 with 6 weeks of cefazolin after successful surgery. ? ?She presented to the ER on 5 March with acute on chronic worsening of her upper thoracic back pain. ? ?Then seen by PCP and noted to be confused came to the ER with worsening fevers and chills. ? 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 ? ?She had initially been on broad-spectrum antibiotics in the form of vancomycin and cefepime and then ceftriaxone.  Blood cultures were without growth and IR guided aspirate of the disc space yielded methicillin sensitive Staphylococcus aureus. ? ?He was appropriately narrowed to cefazolin and been on that ever since then. ? ?Apparently she has been having some left-sided neck pain as well as trembling that began initially on the left side but now also involving her right hand as well. ? ?On exam today her strength is weaker on the left side and more so than I would expect for simple lead being due to right hand dominance. ? ?MRI of her thoracic spine was performed on May 4 which showed progressive findings in the thoracic spine from T8-T10 with progression of osteomyelitis with inflammation and spinal cord compression maximal at T8 with continued presence of an epidural phlegmon and dural thickening and enhancement. ? ?She also had more degeneration of her thoracic spine 10 through 11. ? ?Dr. Arnoldo Morale has and the patient is understandably concerned  also about potential infection of the patient's cervical spine. ? ?MRI of the cervical spine is being performed tomorrow and after that Dr. Arnoldo Morale will plan on surgical attention to the thoracic and possibly the cervical spine. ? ? ? ? ?Past Medical History:  ?Diagnosis Date  ? Arthritis   ? "knees, ankles, fingers" (07/06/2018)  ? Asthma   ? Chronic lower back pain   ? Dyspnea   ? with asthma attacks   ? Dysrhythmia   ? Family history of adverse reaction to anesthesia   ? my first cousin had difficulty waking up   ? Fibroid   ? ovarian  ? GERD (gastroesophageal reflux disease)   ? Hip osteoarthritis   ? Left  ? History of staph infection   ? in hospital for 11 days/ in 2007  ? Hyperlipidemia   ? Hypertension   ? Radiculopathy   ? Type II diabetes mellitus ([place])   ? Wears glasses   ? ? ?Past Surgical History:  ?Procedure Laterality Date  ? ANTERIOR CERVICAL DECOMP/DISCECTOMY FUSION N/A 12/11/2015  ? Procedure: ANTERIOR CERVICAL DECOMPRESSION/DISCECTOMY FUSION 2 LEVELS;  Surgeon: Phylliss Bob, MD;  Location: [facility];  Service: Orthopedics;  Laterality: N/A;  Anterior cervical decompression fusion, cervical 6-7, cervical 7-thoracic 1 with instrumentation and allograft  ? BACK SURGERY    ? CARDIAC CATHETERIZATION  1998  ? CATARACT EXTRACTION W/ INTRAOCULAR LENS  IMPLANT, BILATERAL Bilateral 2015  ? Bil  ? COLONOSCOPY    ? CRYOABLATION  1988  ? 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 INCISION AND DRAINAGE Bilateral 2007>  ? "  cleaned staph out of shoulders"  ? IR FLUORO GUIDED NEEDLE PLC ASPIRATION/INJECTION LOC  01/27/2022  ? IR FLUORO GUIDED NEEDLE PLC ASPIRATION/INJECTION LOC  01/27/2022  ? JOINT REPLACEMENT    ? [place]  ? LUMBAR LAMINECTOMY/DECOMPRESSION MICRODISCECTOMY N/A 01/31/2018  ? Procedure: LAMINECTOMY AND FORAMINOTOMY LUMBAR TWO- LUMBAR THREE, LUMBAR THREE- LUMBAR FOUR, LUMBAR FOUR- LUMBAR FIVE ;  Surgeon: Newman Pies, MD;  Location: [facility];  Service:  Neurosurgery;  Laterality: N/A;  ? POSTERIOR CERVICAL LAMINECTOMY  ~ 1999  ? SHOULDER OPEN ROTATOR CUFF REPAIR Right 02/2003  ? THUMB FUSION Right 2010  ? right thumb  ? TONSILLECTOMY    ? TOTAL ABDOMINAL HYSTERECTOMY  1998  ? TAH,BSO  ? TOTAL HIP ARTHROPLASTY Right 05/18/2018  ? Procedure: TOTAL HIP ARTHROPLASTY ANTERIOR APPROACH;  Surgeon: Frederik Pear, MD;  Location: [facility];  Service: Orthopedics;  Laterality: Right;  ? TOTAL HIP ARTHROPLASTY Left 07/06/2018  ? TOTAL HIP ARTHROPLASTY Left 07/06/2018  ? Procedure: LEFT TOTAL HIP ARTHROPLASTY ANTERIOR APPROACH;  Surgeon: Frederik Pear, MD;  Location: [facility];  Service: Orthopedics;  Laterality: Left;  Needs RNFA  ? TUBAL LIGATION    ? ? ?Family History  ?Problem Relation Age of Onset  ? Diabetes Sister   ? Diabetes Brother   ? Diabetes Brother   ? Diabetes Paternal Uncle   ? Cancer Paternal Aunt   ?     UTERINE  ? Hypertension Maternal Grandmother   ? Heart disease Maternal Grandmother   ? Colon cancer Neg Hx   ? ? ?  ?Social History  ? ?Socioeconomic History  ? Marital status: Married  ?  Spouse name: Not on file  ? Number of children: Not on file  ? Years of education: Not on file  ? Highest education level: Not on file  ?Occupational History  ? Not on file  ?Tobacco Use  ? Smoking status: Former  ?  Packs/day: 1.00  ?  Years: 25.00  ?  Pack years: 25.00  ?  Types: Cigarettes  ?  Quit date: 11/23/1992  ?  Years since quitting: 29.3  ? Smokeless tobacco: Never  ?Vaping Use  ? Vaping Use: Never used  ?Substance and Sexual Activity  ? Alcohol use: Not Currently  ? Drug use: Never  ? Sexual activity: Not Currently  ?Other Topics Concern  ? Not on file  ?Social History Narrative  ? Not on file  ? ?Social Determinants of Health  ? ?Financial Resource Strain: Not on file  ?Food Insecurity: Not on file  ?Transportation Needs: Not on file  ?Physical Activity: Not on file  ?Stress: Not on file  ?Social Connections: Not on file  ? ? ?Allergies  ?Allergen Reactions  ? Ivp Dye  [Iodinated Contrast Media] Nausea And Vomiting  ? Prednisone Other (See Comments)  ?  reflux ?Other reaction(s): acid reflux ?Other reaction(s): acid reflux ?Other reaction(s): acid reflux  ? ? ? ?Current Outpatient Medications:  ?  acetaminophen (TYLENOL) 500 MG tablet, Take 1,000 mg by mouth daily as needed for moderate pain or headache., Disp: , Rfl:  ?  albuterol (ACCUNEB) 1.25 MG/3ML nebulizer solution, Take 1 ampule by nebulization every 4 (four) hours as needed for wheezing., Disp: , Rfl:  ?  amoxicillin (AMOXIL) 500 MG capsule, Take 2,000 mg by mouth See admin instructions. 1 hour prior to dental appointment, Disp: , Rfl:  ?  Blood Glucose Monitoring Suppl (ONE TOUCH ULTRA 2) w/Device KIT, Check blood sugar twice per  week, Disp: , Rfl:  ?  bumetanide (BUMEX) 1 MG tablet, Take 1 mg by mouth daily., Disp: , Rfl:  ?  cetirizine (ZYRTEC) 10 MG tablet, Take 10 mg by mouth daily as needed for allergies (during Spring/Summer)., Disp: , Rfl:  ?  cholecalciferol (VITAMIN D3) 25 MCG (1000 UNIT) tablet, Take 1,000 Units by mouth daily., Disp: , Rfl:  ?  diclofenac (VOLTAREN) 75 MG EC tablet, Take 1 tablet by mouth daily as needed for mild pain., Disp: , Rfl:  ?  diclofenac Sodium (VOLTAREN) 1 % GEL, Apply 4 g topically 4 (four) times daily., Disp: 100 g, Rfl: 0 ?  diltiazem (CARDIZEM CD) 180 MG 24 hr capsule, Take 180 mg by mouth daily., Disp: , Rfl:  ?  docusate sodium (COLACE) 100 MG capsule, Take 100 mg by mouth daily as needed for mild constipation., Disp: , Rfl:  ?  enalapril (VASOTEC) 20 MG tablet, Take 20 mg by mouth 2 (two) times daily., Disp: , Rfl:  ?  fluticasone (FLONASE) 50 MCG/ACT nasal spray, Place 1 spray into both nostrils daily., Disp: , Rfl:  ?  folic acid (FOLVITE) 601 MCG tablet, Take 800 mcg by mouth daily., Disp: , Rfl:  ?  gabapentin (NEURONTIN) 300 MG capsule, Take 300 mg by mouth at bedtime., Disp: , Rfl:  ?  glucose blood test strip, , Disp: , Rfl:  ?  Lancets (ONETOUCH DELICA PLUS [id number])  MISC, Use to check blood sugar, Disp: , Rfl:  ?  lovastatin (MEVACOR) 40 MG tablet, Take 40 mg by mouth at bedtime. , Disp: , Rfl:  ?  meclizine (ANTIVERT) 12.5 MG tablet, Take 12.5 mg by mouth 2 (two) times daily a

## 2022-03-30 NOTE — Telephone Encounter (Signed)
Patient advised that her IV antibiotics will possibly need to be changed due to her MRI results. Patient advised that that she will need to be seen. Patient scheduled with Dr. Tommy Medal today and will see neurology before her appointment with Dr. Tommy Medal today.  ?

## 2022-03-30 NOTE — Telephone Encounter (Signed)
-----   Message from Rosiland Oz, MD sent at 03/27/2022  2:26 PM EDT ----- ?Regarding: Appointment ?Could you also schedule her an appointment with one of the ID docs next week in a 30 minutes slot regarding the new MRI findings and possibly need to change abtx? I am unavailable for next 2 weeks.  ? ?

## 2022-03-30 NOTE — Telephone Encounter (Signed)
-----   Message from Rosiland Oz, MD sent at 03/27/2022  2:33 PM EDT ----- ?Discussed with patient and daughter over phone ?They are contacting Dr Adline Mango office for his recommendations and asking the results of MRI to be faxed to his office.  ?I have told patient's daughter to come to the hospital in case of any new neurological symptoms which they deny currently except the tremor in left hand.  ?Will continue IV cefazolin until she can be seen at the ID office.  ? ?

## 2022-03-31 ENCOUNTER — Ambulatory Visit (HOSPITAL_COMMUNITY)
Admission: RE | Admit: 2022-03-31 | Discharge: 2022-03-31 | Disposition: A | Discharge status: Home / Self Care | Payer: HMO | Source: Ambulatory Visit | Attending: Neurosurgery | Admitting: Neurosurgery

## 2022-03-31 DIAGNOSIS — M4624 Osteomyelitis of vertebra, thoracic region: Secondary | ICD-10-CM | POA: Insufficient documentation

## 2022-03-31 DIAGNOSIS — M542 Cervicalgia: Secondary | ICD-10-CM | POA: Insufficient documentation

## 2022-03-31 DIAGNOSIS — M0609 Rheumatoid arthritis without rheumatoid factor, multiple sites: Secondary | ICD-10-CM | POA: Diagnosis not present

## 2022-03-31 DIAGNOSIS — M47814 Spondylosis without myelopathy or radiculopathy, thoracic region: Secondary | ICD-10-CM | POA: Diagnosis not present

## 2022-03-31 DIAGNOSIS — M4620 Osteomyelitis of vertebra, site unspecified: Secondary | ICD-10-CM | POA: Diagnosis not present

## 2022-03-31 DIAGNOSIS — M1990 Unspecified osteoarthritis, unspecified site: Secondary | ICD-10-CM | POA: Diagnosis not present

## 2022-03-31 DIAGNOSIS — Z79899 Other long term (current) drug therapy: Secondary | ICD-10-CM | POA: Diagnosis not present

## 2022-03-31 DIAGNOSIS — M50221 Other cervical disc displacement at C4-C5 level: Secondary | ICD-10-CM | POA: Diagnosis not present

## 2022-03-31 DIAGNOSIS — M4312 Spondylolisthesis, cervical region: Secondary | ICD-10-CM | POA: Diagnosis not present

## 2022-03-31 DIAGNOSIS — M47812 Spondylosis without myelopathy or radiculopathy, cervical region: Secondary | ICD-10-CM | POA: Diagnosis not present

## 2022-03-31 MED ORDER — GADOBUTROL 1 MMOL/ML IV SOLN
6.0000 mL | Freq: Once | INTRAVENOUS | Status: AC | PRN
  Administered 2022-03-31: 6 mL via INTRAVENOUS

## 2022-03-31 NOTE — Telephone Encounter (Signed)
Thank you :)

## 2022-04-01 DIAGNOSIS — J432 Centrilobular emphysema: Secondary | ICD-10-CM | POA: Diagnosis not present

## 2022-04-01 DIAGNOSIS — M4620 Osteomyelitis of vertebra, site unspecified: Secondary | ICD-10-CM | POA: Diagnosis not present

## 2022-04-01 DIAGNOSIS — E1142 Type 2 diabetes mellitus with diabetic polyneuropathy: Secondary | ICD-10-CM | POA: Diagnosis not present

## 2022-04-01 DIAGNOSIS — M0609 Rheumatoid arthritis without rheumatoid factor, multiple sites: Secondary | ICD-10-CM | POA: Diagnosis not present

## 2022-04-02 ENCOUNTER — Other Ambulatory Visit: Payer: Self-pay | Admitting: Neurosurgery

## 2022-04-04 DIAGNOSIS — A419 Sepsis, unspecified organism: Secondary | ICD-10-CM | POA: Diagnosis not present

## 2022-04-06 DIAGNOSIS — A4101 Sepsis due to Methicillin susceptible Staphylococcus aureus: Secondary | ICD-10-CM | POA: Diagnosis not present

## 2022-04-06 DIAGNOSIS — Z79899 Other long term (current) drug therapy: Secondary | ICD-10-CM | POA: Diagnosis not present

## 2022-04-06 DIAGNOSIS — Z7951 Long term (current) use of inhaled steroids: Secondary | ICD-10-CM | POA: Diagnosis not present

## 2022-04-06 DIAGNOSIS — I700 Atherosclerosis of aorta: Secondary | ICD-10-CM | POA: Diagnosis not present

## 2022-04-06 DIAGNOSIS — I081 Rheumatic disorders of both mitral and tricuspid valves: Secondary | ICD-10-CM | POA: Diagnosis not present

## 2022-04-06 DIAGNOSIS — G062 Extradural and subdural abscess, unspecified: Secondary | ICD-10-CM | POA: Diagnosis not present

## 2022-04-06 DIAGNOSIS — M4805 Spinal stenosis, thoracolumbar region: Secondary | ICD-10-CM | POA: Diagnosis not present

## 2022-04-06 DIAGNOSIS — M5134 Other intervertebral disc degeneration, thoracic region: Secondary | ICD-10-CM | POA: Diagnosis not present

## 2022-04-06 DIAGNOSIS — I119 Hypertensive heart disease without heart failure: Secondary | ICD-10-CM | POA: Diagnosis not present

## 2022-04-06 DIAGNOSIS — Z7984 Long term (current) use of oral hypoglycemic drugs: Secondary | ICD-10-CM | POA: Diagnosis not present

## 2022-04-06 DIAGNOSIS — Z96643 Presence of artificial hip joint, bilateral: Secondary | ICD-10-CM | POA: Diagnosis not present

## 2022-04-06 DIAGNOSIS — Z792 Long term (current) use of antibiotics: Secondary | ICD-10-CM | POA: Diagnosis not present

## 2022-04-06 DIAGNOSIS — M19012 Primary osteoarthritis, left shoulder: Secondary | ICD-10-CM | POA: Diagnosis not present

## 2022-04-06 DIAGNOSIS — M4804 Spinal stenosis, thoracic region: Secondary | ICD-10-CM | POA: Diagnosis not present

## 2022-04-06 DIAGNOSIS — M4624 Osteomyelitis of vertebra, thoracic region: Secondary | ICD-10-CM | POA: Diagnosis not present

## 2022-04-06 DIAGNOSIS — E119 Type 2 diabetes mellitus without complications: Secondary | ICD-10-CM | POA: Diagnosis not present

## 2022-04-06 DIAGNOSIS — M4726 Other spondylosis with radiculopathy, lumbar region: Secondary | ICD-10-CM | POA: Diagnosis not present

## 2022-04-06 DIAGNOSIS — Z452 Encounter for adjustment and management of vascular access device: Secondary | ICD-10-CM | POA: Diagnosis not present

## 2022-04-06 DIAGNOSIS — J432 Centrilobular emphysema: Secondary | ICD-10-CM | POA: Diagnosis not present

## 2022-04-06 DIAGNOSIS — M4316 Spondylolisthesis, lumbar region: Secondary | ICD-10-CM | POA: Diagnosis not present

## 2022-04-06 DIAGNOSIS — M48061 Spinal stenosis, lumbar region without neurogenic claudication: Secondary | ICD-10-CM | POA: Diagnosis not present

## 2022-04-06 DIAGNOSIS — Z5181 Encounter for therapeutic drug level monitoring: Secondary | ICD-10-CM | POA: Diagnosis not present

## 2022-04-06 DIAGNOSIS — M47814 Spondylosis without myelopathy or radiculopathy, thoracic region: Secondary | ICD-10-CM | POA: Diagnosis not present

## 2022-04-06 DIAGNOSIS — E785 Hyperlipidemia, unspecified: Secondary | ICD-10-CM | POA: Diagnosis not present

## 2022-04-06 DIAGNOSIS — M5136 Other intervertebral disc degeneration, lumbar region: Secondary | ICD-10-CM | POA: Diagnosis not present

## 2022-04-06 DIAGNOSIS — M069 Rheumatoid arthritis, unspecified: Secondary | ICD-10-CM | POA: Diagnosis not present

## 2022-04-08 NOTE — Progress Notes (Signed)
Surgical Instructions ? ? ? Your procedure is scheduled on Monday May 22nd. ? Report to [facility] Entrance "A" at 1200 P.M., then check in with the Admitting office. ? Call this number if you have problems the morning of surgery: ? [phone] ? ? If you have any questions prior to your surgery date call [phone]: Open Monday-Friday 8am-4pm ? ? ? Remember: ? Do not eat or drink after midnight the night before your surgery ?  ? Take these medicines the morning of surgery with A SIP OF WATER: ?cetirizine (ZYRTEC) 10 MG tablet ?diltiazem (CARDIZEM CD) 180 MG 24 hr capsule ?methocarbamol (ROBAXIN) 500 MG tablet ?TRELEGY ELLIPTA 200-62.5-25 MCG/ACT AEPB ? ?IF NEEDED  ?acetaminophen (TYLENOL) 500 MG tablet ?albuterol (ACCUNEB) 1.25 MG/3ML nebulizer solution ?albuterol (VENTOLIN HFA) 108 (90 Base) MCG/ACT inhaler - please bring with you to the hospital ?hydrOXYzine (ATARAX) 10 MG tablet ?meclizine (ANTIVERT) 12.5 MG tablet ?omeprazole (PRILOSEC OTC) 20 MG tablet ?sodium chloride (OCEAN) 0.65 % SOLN nasal spray ? ? ?As of today, STOP taking any Voltaren, Aspirin (unless otherwise instructed by your surgeon) Aleve, Naproxen, Ibuprofen, Motrin, Advil, Goody's, BC's, all herbal medications, fish oil, and all vitamins. ? ? ?WHAT DO I DO ABOUT MY DIABETES MEDICATION? ? ? ?Do not take oral diabetes medicines (Metformin) the morning of surgery. ? ? ? ?HOW TO MANAGE YOUR DIABETES ?BEFORE AND AFTER SURGERY ? ?Why is it important to control my blood sugar before and after surgery? ?Improving blood sugar levels before and after surgery helps healing and can limit problems. ?A way of improving blood sugar control is eating a healthy diet by: ? Eating less sugar and carbohydrates ? Increasing activity/exercise ? Talking with your doctor about reaching your blood sugar goals ?High blood sugars (greater than 180 mg/dL) can raise your risk of infections and slow your recovery, so you will need to focus on controlling your  diabetes during the weeks before surgery. ?Make sure that the doctor who takes care of your diabetes knows about your planned surgery including the date and location. ? ?How do I manage my blood sugar before surgery? ?Check your blood sugar at least 4 times a day, starting 2 days before surgery, to make sure that the level is not too high or low. ? ?Check your blood sugar the morning of your surgery when you wake up and every 2 hours until you get to the Short Stay unit. ? ?If your blood sugar is less than 70 mg/dL, you will need to treat for low blood sugar: ?Do not take insulin. ?Treat a low blood sugar (less than 70 mg/dL) with ? cup of clear juice (cranberry or apple), 4 glucose tablets, OR glucose gel. ?Recheck blood sugar in 15 minutes after treatment (to make sure it is greater than 70 mg/dL). If your blood sugar is not greater than 70 mg/dL on recheck, call [phone] for further instructions. ?Report your blood sugar to the short stay nurse when you get to Short Stay. ? ?If you are admitted to the hospital after surgery: ?Your blood sugar will be checked by the staff and you will probably be given insulin after surgery (instead of oral diabetes medicines) to make sure you have good blood sugar levels. ?The goal for blood sugar control after surgery is 80-180 mg/dL. ? ?         ?Do not wear jewelry or makeup ?Do not wear lotions, powders, perfumes, or deodorant. ?Do not shave 48 hours prior to surgery.   ?Do  not bring valuables to the hospital. ?Do not wear nail polish, gel polish, artificial nails, or any other type of covering on natural nails (fingers and toes) ?If you have artificial nails or gel coating that need to be removed by a nail salon, please have this removed prior to surgery. Artificial nails or gel coating may interfere with anesthesia's ability to adequately monitor your vital signs. ? ?[facility] is not responsible for any belongings or valuables. .  ? ?Do NOT Smoke (Tobacco/Vaping)  24  hours prior to your procedure ? ?If you use a CPAP at night, you may bring your mask for your overnight stay. ?  ?Contacts, glasses, hearing aids, dentures or partials may not be worn into surgery, please bring cases for these belongings ?  ?For patients admitted to the hospital, discharge time will be determined by your treatment team. ?  ?Patients discharged the day of surgery will not be allowed to drive home, and someone needs to stay with them for 24 hours. ? ? ?SURGICAL WAITING ROOM VISITATION ?Patients having surgery or a procedure in a hospital may have two support people. ?Children under the age of 25 must have an adult with them who is not the patient. ?They may stay in the waiting area during the procedure and may switch out with other visitors. If the patient needs to stay at the hospital during part of their recovery, the visitor guidelines for inpatient rooms apply. ? ?Please refer to the [facility] website for the visitor guidelines for Inpatients (after your surgery is over and you are in a regular room).  ? ? ? ? ? ?Special instructions:   ? ?Oral Hygiene is also important to reduce your risk of infection.  Remember - BRUSH YOUR TEETH THE MORNING OF SURGERY WITH YOUR REGULAR TOOTHPASTE ? ? ?[facility]- Preparing For Surgery ? ?Before surgery, you can play an important role. Because skin is not sterile, your skin needs to be as free of germs as possible. You can reduce the number of germs on your skin by washing with CHG (chlorahexidine gluconate) Soap before surgery.  CHG is an antiseptic cleaner which kills germs and bonds with the skin to continue killing germs even after washing.   ? ? ?Please do not use if you have an allergy to CHG or antibacterial soaps. If your skin becomes reddened/irritated stop using the CHG.  ?Do not shave (including legs and underarms) for at least 48 hours prior to first CHG shower. It is OK to shave your face. ? ?Please follow these instructions carefully. ?  ? ?  Shower the NIGHT BEFORE SURGERY and the MORNING OF SURGERY with CHG Soap.  ? If you chose to wash your hair, wash your hair first as usual with your normal shampoo. After you shampoo, rinse your hair and body thoroughly to remove the shampoo.  Then [employer] and genitals (private parts) with your normal soap and rinse thoroughly to remove soap. ? ?After that Use CHG Soap as you would any other liquid soap. You can apply CHG directly to the skin and wash gently with a scrungie or a clean washcloth.  ? ?Apply the CHG Soap to your body ONLY FROM THE NECK DOWN.  Do not use on open wounds or open sores. Avoid contact with your eyes, ears, mouth and genitals (private parts). Wash Face and genitals (private parts)  with your normal soap.  ? ?Wash thoroughly, paying special attention to the area where your surgery will be performed. ? ?  Thoroughly rinse your body with warm water from the neck down. ? ?DO NOT shower/wash with your normal soap after using and rinsing off the CHG Soap. ? ?Pat yourself dry with a CLEAN TOWEL. ? ?Wear CLEAN PAJAMAS to bed the night before surgery ? ?Place CLEAN SHEETS on your bed the night before your surgery ? ?DO NOT SLEEP WITH PETS. ? ? ?Day of Surgery: ? ?Take a shower with CHG soap. ?Wear Clean/Comfortable clothing the morning of surgery ?Do not apply any deodorants/lotions.   ?Remember to brush your teeth WITH YOUR REGULAR TOOTHPASTE. ? ? ? ?If you received a COVID test during your pre-op visit, it is requested that you wear a mask when out in public, stay away from anyone that may not be feeling well, and notify your surgeon if you develop symptoms. If you have been in contact with anyone that has tested positive in the last 10 days, please notify your surgeon. ? ?  ?Please read over the following fact sheets that you were given.  ? ?

## 2022-04-09 ENCOUNTER — Encounter (HOSPITAL_COMMUNITY): Payer: Self-pay

## 2022-04-09 ENCOUNTER — Encounter (HOSPITAL_COMMUNITY)
Admission: RE | Admit: 2022-04-09 | Discharge: 2022-04-09 | Disposition: A | Discharge status: Home / Self Care | Payer: HMO | Source: Ambulatory Visit | Attending: Neurosurgery | Admitting: Neurosurgery

## 2022-04-09 ENCOUNTER — Other Ambulatory Visit: Payer: Self-pay

## 2022-04-09 VITALS — BP 127/72 | HR 77 | Temp 98.0°F | Resp 17 | Ht <= 58 in

## 2022-04-09 DIAGNOSIS — Z981 Arthrodesis status: Secondary | ICD-10-CM | POA: Insufficient documentation

## 2022-04-09 DIAGNOSIS — M4712 Other spondylosis with myelopathy, cervical region: Secondary | ICD-10-CM | POA: Insufficient documentation

## 2022-04-09 DIAGNOSIS — Z01812 Encounter for preprocedural laboratory examination: Secondary | ICD-10-CM | POA: Insufficient documentation

## 2022-04-09 DIAGNOSIS — M4714 Other spondylosis with myelopathy, thoracic region: Secondary | ICD-10-CM | POA: Insufficient documentation

## 2022-04-09 DIAGNOSIS — M4624 Osteomyelitis of vertebra, thoracic region: Secondary | ICD-10-CM | POA: Insufficient documentation

## 2022-04-09 DIAGNOSIS — E119 Type 2 diabetes mellitus without complications: Secondary | ICD-10-CM | POA: Insufficient documentation

## 2022-04-09 DIAGNOSIS — M4804 Spinal stenosis, thoracic region: Secondary | ICD-10-CM | POA: Diagnosis not present

## 2022-04-09 DIAGNOSIS — Z01818 Encounter for other preprocedural examination: Secondary | ICD-10-CM

## 2022-04-09 DIAGNOSIS — I517 Cardiomegaly: Secondary | ICD-10-CM | POA: Insufficient documentation

## 2022-04-09 LAB — CBC
HCT: 37.5 % (ref 36.0–46.0)
Hemoglobin: 11.7 g/dL — ABNORMAL LOW (ref 12.0–15.0)
MCH: 27.3 pg (ref 26.0–34.0)
MCHC: 31.2 g/dL (ref 30.0–36.0)
MCV: 87.4 fL (ref 80.0–100.0)
Platelets: 341 10*3/uL (ref 150–400)
RBC: 4.29 MIL/uL (ref 3.87–5.11)
RDW: 16.1 % — ABNORMAL HIGH (ref 11.5–15.5)
WBC: 7.5 10*3/uL (ref 4.0–10.5)
nRBC: 0 % (ref 0.0–0.2)

## 2022-04-09 LAB — BASIC METABOLIC PANEL
Anion gap: 10 (ref 5–15)
BUN: 26 mg/dL — ABNORMAL HIGH (ref 8–23)
CO2: 28 mmol/L (ref 22–32)
Calcium: 9.5 mg/dL (ref 8.9–10.3)
Chloride: 100 mmol/L (ref 98–111)
Creatinine, Ser: 0.93 mg/dL (ref 0.44–1.00)
GFR, Estimated: >60 mL/min (ref >60)
Glucose, Bld: 88 mg/dL (ref 70–99)
Potassium: 4.6 mmol/L (ref 3.5–5.1)
Sodium: 138 mmol/L (ref 135–145)

## 2022-04-09 LAB — GLUCOSE, CAPILLARY: Glucose-Capillary: 77 mg/dL (ref 70–99)

## 2022-04-09 LAB — SURGICAL PCR SCREEN
MRSA, PCR: NEGATIVE
Staphylococcus aureus: NEGATIVE

## 2022-04-09 LAB — HEMOGLOBIN A1C
Hgb A1c MFr Bld: 5.5 % (ref 4.8–5.6)
Mean Plasma Glucose: 111.15 mg/dL

## 2022-04-09 NOTE — Progress Notes (Signed)
PCP - Merrilee Seashore, MD Cardiologist - denies  PPM/ICD - denies Device Orders - n/a Rep Notified - n/a  Chest x-ray - n/a EKG - 01/26/2022 Stress Test - denies ECHO - 01/27/2022 Cardiac Cath - 1998  Sleep Study - denies CPAP - n/a  Fasting Blood Sugar - 89 - 101 Checks Blood Sugar - once a day CBG today - 77 A1C - done in PAT on 04/09/2022  Blood Thinner Instructions:n/a  Aspirin Instructions: Patient was instructed: As of today, STOP taking any Voltaren, Aspirin (unless otherwise instructed by your surgeon) Aleve, Naproxen, Ibuprofen, Motrin, Advil, Goody's, BC's, all herbal medications, fish oil, and all vitamins.  Patient verbalized that she stopped taking Methotrexate on 01/31/2022 per MD recommendation.  ERAS Protcol - n/a  COVID TEST- n/a  Anesthesia review: yes - cardiac history; patient was hospitalized in March for Sepsis; family history of anesthesia complication - patient verbalized that her sister had problems waking up.   Patient denies shortness of breath, fever, cough and chest pain at PAT appointment   All instructions explained to the patient, with a verbal understanding of the material. Patient agrees to go over the instructions while at home for a better understanding. Patient also instructed to self quarantine after being tested for COVID-19. The opportunity to ask questions was provided.

## 2022-04-10 NOTE — Progress Notes (Signed)
Anesthesia Chart Review:  Recent admission March 2023 for sepsis secondary to thoracic discitis/osteomyelitis with epidural phlegmon.  She has been followed outpatient by infectious disease.  Last seen by Dr. Tommy Medal on 03/30/2022.  PICC line in place.  Continues to be treated with cefazolin.  Upcoming surgery was discussed.  History of C5-T1 cervical fusion.  Non-insulin-dependent DM2 well-controlled, A1c 5.5 on preop labs.  Echo during recent admission showed EF 60-65%, no RWMA's, moderate asymmetric left ventricular hypertrophy of the basal septal segment, grade 1 DD, no significant valvular abnormalities.  Preop labs reviewed, mild anemia with hemoglobin 11.7, otherwise unremarkable.  EKG 01/26/2022 (in setting of admission for sepsis): Sinus tach.  Rate 125.  LAE, consider biatrial enlargement.  Left anterior fascicular block.  Anterior infarct, old.  MR cervical spine 03/31/2022: IMPRESSION: No evidence of spinal infection in the region from the skull base to T6.   Previous fusion from C5-T1. No complicating feature in that segment.   Degenerative changes at C4-5 and T1-2. Facet osteoarthritis at C4-5 with 2 mm of anterolisthesis and mild bilateral foraminal narrowing. Foraminal stenosis at T1-2 could possibly be symptomatic.   MR thoracic spine 03/26/2022: IMPRESSION: 1. Progression and no significant imaging improvement in multilevel thoracic spine infection T8 through T10 compared to the March MRI. Obvious progression of osteomyelitis at T8 since that time. 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   2. No drainable fluid collection now.   3. More degenerative appearing spinal stenosis at both T10-T11 (moderate) and T11-T12 (mild) also appears progressed since March. No other cord signal  abnormality identified.  TTE 01/27/2022:  1. Left ventricular ejection fraction, by estimation, is 60 to 65%. Left  ventricular ejection fraction by PLAX is 63 %. The left ventricle has  normal function. The left ventricle has no regional wall motion  abnormalities. There is moderate asymmetric  left ventricular hypertrophy of the basal-septal segment. Left ventricular  diastolic parameters are consistent with Grade I diastolic dysfunction  (impaired relaxation). Elevated left ventricular end-diastolic pressure.  The E/e' is 73.   2. Right ventricular systolic function is normal. The right ventricular  size is normal. There is normal pulmonary artery systolic pressure. The  estimated right ventricular systolic pressure is 68.3 mmHg.   3. The mitral valve is grossly normal. Trivial mitral valve  regurgitation.   4. The aortic valve is tricuspid. Aortic valve regurgitation is not  visualized.   5. The inferior vena cava is normal in size with greater than 50%  respiratory variability, suggesting right atrial pressure of 3 mmHg.   Comparison(s): No prior Echocardiogram.    Wynonia Musty [facility] Short Stay Center/Anesthesiology Phone [phone] 04/10/2022 10:46 AM

## 2022-04-10 NOTE — Anesthesia Preprocedure Evaluation (Addendum)
Anesthesia Evaluation  Patient identified by MRN, date of birth, ID band Patient awake    Reviewed: Allergy & Precautions, NPO status , Patient's Chart, lab work & pertinent test results  Airway Mallampati: II  TM Distance: >3 FB Neck ROM: Full    Dental no notable dental hx. (+) Chipped,    Pulmonary asthma , COPD, former smoker,    Pulmonary exam normal        Cardiovascular hypertension, Pt. on medications + dysrhythmias  Rhythm:Regular Rate:Normal     Neuro/Psych negative neurological ROS  negative psych ROS   GI/Hepatic Neg liver ROS, GERD  Medicated,  Endo/Other  diabetes, Type 2, Oral Hypoglycemic Agents  Renal/GU   negative genitourinary   Musculoskeletal  (+) Arthritis , Osteoarthritis,  Osteomyelitis thoracic spine   Abdominal Normal abdominal exam  (+)   Peds  Hematology negative hematology ROS (+)   Anesthesia Other Findings   Reproductive/Obstetrics                           Anesthesia Physical Anesthesia Plan  ASA: 3  Anesthesia Plan: General   Post-op Pain Management:    Induction: Intravenous  PONV Risk Score and Plan: 3 and Ondansetron, Dexamethasone and Treatment may vary due to age or medical condition  Airway Management Planned: Mask and Oral ETT  Additional Equipment: None  Intra-op Plan:   Post-operative Plan: Extubation in OR  Informed Consent: I have reviewed the patients History and Physical, chart, labs and discussed the procedure including the risks, benefits and alternatives for the proposed anesthesia with the patient or authorized representative who has indicated his/her understanding and acceptance.     Dental advisory given  Plan Discussed with: CRNA  Anesthesia Plan Comments: (PAT note by Karoline Caldwell, PA-C:  Recent admission March 2023 for sepsis secondary to thoracic discitis/osteomyelitis with epidural phlegmon.  She has been followed  outpatient by infectious disease.  Last seen by Dr. Tommy Medal on 03/30/2022.  PICC line in place.  Continues to be treated with cefazolin.  Upcoming surgery was discussed.  History of C5-T1 cervical fusion.  Non-insulin-dependent DM2 well-controlled, A1c 5.5 on preop labs.  Echo during recent admission showed EF 60-65%, no RWMA's, moderate asymmetric left ventricular hypertrophy of the basal septal segment, grade 1 DD, no significant valvular abnormalities.  Preop labs reviewed, mild anemia with hemoglobin 11.7, otherwise unremarkable.  EKG 01/26/2022 (in setting of admission for sepsis): Sinus tach.  Rate 125.  LAE, consider biatrial enlargement.  Left anterior fascicular block.  Anterior infarct, old.  MR cervical spine 03/31/2022: IMPRESSION: No evidence of spinal infection in the region from the skull base to T6.  Previous fusion from C5-T1. No complicating feature in that segment.  Degenerative changes at C4-5 and T1-2. Facet osteoarthritis at C4-5 with 2 mm of anterolisthesis and mild bilateral foraminal narrowing. Foraminal stenosis at T1-2 could possibly be symptomatic.  MR thoracic spine 03/26/2022: IMPRESSION: 1. Progression and no significant imaging improvement in multilevel thoracic spine infection T8 through T10 compared to the March MRI. Obvious progression of osteomyelitis at T8 since that time. 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  2. No drainable fluid collection now.  3. More degenerative appearing spinal stenosis at both T10-T11 (moderate) and T11-T12 (mild) also appears progressed since March. No other cord signal abnormality identified.  TTE 01/27/2022: 1. Left ventricular ejection fraction, by estimation, is 60 to 65%. Left  ventricular ejection fraction by PLAX is 63  %. The left ventricle has  normal function. The left ventricle has no regional wall motion  abnormalities. There is moderate asymmetric  left ventricular hypertrophy of the basal-septal segment. Left ventricular  diastolic parameters are consistent with Grade I diastolic dysfunction  (impaired relaxation). Elevated left ventricular end-diastolic pressure.  The E/e' is 55.  2. Right ventricular systolic function is normal. The right ventricular  size is normal. There is normal pulmonary artery systolic pressure. The  estimated right ventricular systolic pressure is 81.1 mmHg.  3. The mitral valve is grossly normal. Trivial mitral valve  regurgitation.  4. The aortic valve is tricuspid. Aortic valve regurgitation is not  visualized.  5. The inferior vena cava is normal in size with greater than 50%  respiratory variability, suggesting right atrial pressure of 3 mmHg.   Comparison(s): No prior Echocardiogram.  )       Anesthesia Quick Evaluation

## 2022-04-11 DIAGNOSIS — A419 Sepsis, unspecified organism: Secondary | ICD-10-CM | POA: Diagnosis not present

## 2022-04-13 ENCOUNTER — Other Ambulatory Visit: Payer: Self-pay

## 2022-04-13 ENCOUNTER — Ambulatory Visit (HOSPITAL_COMMUNITY): Payer: HMO

## 2022-04-13 ENCOUNTER — Ambulatory Visit (HOSPITAL_COMMUNITY)
Admission: RE | Disposition: A | Discharge status: Home / Self Care | Payer: Self-pay | Source: Home / Self Care | Attending: Neurosurgery

## 2022-04-13 ENCOUNTER — Ambulatory Visit (HOSPITAL_COMMUNITY)
Admission: RE | Admit: 2022-04-13 | Discharge: 2022-04-14 | Disposition: A | Discharge status: Home / Self Care | Payer: HMO | Attending: Neurosurgery | Admitting: Neurosurgery

## 2022-04-13 ENCOUNTER — Other Ambulatory Visit: Payer: Self-pay | Admitting: Neurosurgery

## 2022-04-13 ENCOUNTER — Encounter (HOSPITAL_COMMUNITY): Payer: Self-pay | Admitting: Neurosurgery

## 2022-04-13 ENCOUNTER — Ambulatory Visit (HOSPITAL_BASED_OUTPATIENT_CLINIC_OR_DEPARTMENT_OTHER): Payer: HMO | Admitting: Certified Registered Nurse Anesthetist

## 2022-04-13 ENCOUNTER — Ambulatory Visit (HOSPITAL_COMMUNITY): Payer: HMO | Admitting: Physician Assistant

## 2022-04-13 DIAGNOSIS — M4624 Osteomyelitis of vertebra, thoracic region: Secondary | ICD-10-CM

## 2022-04-13 DIAGNOSIS — Z79899 Other long term (current) drug therapy: Secondary | ICD-10-CM | POA: Diagnosis not present

## 2022-04-13 DIAGNOSIS — M4726 Other spondylosis with radiculopathy, lumbar region: Secondary | ICD-10-CM | POA: Diagnosis not present

## 2022-04-13 DIAGNOSIS — G061 Intraspinal abscess and granuloma: Secondary | ICD-10-CM | POA: Diagnosis not present

## 2022-04-13 DIAGNOSIS — Z87891 Personal history of nicotine dependence: Secondary | ICD-10-CM | POA: Diagnosis not present

## 2022-04-13 DIAGNOSIS — G959 Disease of spinal cord, unspecified: Secondary | ICD-10-CM

## 2022-04-13 DIAGNOSIS — I700 Atherosclerosis of aorta: Secondary | ICD-10-CM | POA: Diagnosis not present

## 2022-04-13 DIAGNOSIS — Z7951 Long term (current) use of inhaled steroids: Secondary | ICD-10-CM | POA: Diagnosis not present

## 2022-04-13 DIAGNOSIS — M4626 Osteomyelitis of vertebra, lumbar region: Secondary | ICD-10-CM | POA: Diagnosis not present

## 2022-04-13 DIAGNOSIS — M069 Rheumatoid arthritis, unspecified: Secondary | ICD-10-CM | POA: Diagnosis not present

## 2022-04-13 DIAGNOSIS — M48061 Spinal stenosis, lumbar region without neurogenic claudication: Secondary | ICD-10-CM | POA: Diagnosis not present

## 2022-04-13 DIAGNOSIS — Z5181 Encounter for therapeutic drug level monitoring: Secondary | ICD-10-CM | POA: Diagnosis not present

## 2022-04-13 DIAGNOSIS — M4316 Spondylolisthesis, lumbar region: Secondary | ICD-10-CM | POA: Diagnosis not present

## 2022-04-13 DIAGNOSIS — M4620 Osteomyelitis of vertebra, site unspecified: Secondary | ICD-10-CM

## 2022-04-13 DIAGNOSIS — I081 Rheumatic disorders of both mitral and tricuspid valves: Secondary | ICD-10-CM | POA: Diagnosis not present

## 2022-04-13 DIAGNOSIS — J432 Centrilobular emphysema: Secondary | ICD-10-CM | POA: Diagnosis not present

## 2022-04-13 DIAGNOSIS — M19012 Primary osteoarthritis, left shoulder: Secondary | ICD-10-CM | POA: Diagnosis not present

## 2022-04-13 DIAGNOSIS — M4804 Spinal stenosis, thoracic region: Secondary | ICD-10-CM | POA: Diagnosis not present

## 2022-04-13 DIAGNOSIS — M4805 Spinal stenosis, thoracolumbar region: Secondary | ICD-10-CM | POA: Diagnosis not present

## 2022-04-13 DIAGNOSIS — G062 Extradural and subdural abscess, unspecified: Secondary | ICD-10-CM

## 2022-04-13 DIAGNOSIS — Z4682 Encounter for fitting and adjustment of non-vascular catheter: Secondary | ICD-10-CM | POA: Diagnosis not present

## 2022-04-13 DIAGNOSIS — I119 Hypertensive heart disease without heart failure: Secondary | ICD-10-CM | POA: Diagnosis not present

## 2022-04-13 DIAGNOSIS — E119 Type 2 diabetes mellitus without complications: Secondary | ICD-10-CM | POA: Diagnosis not present

## 2022-04-13 DIAGNOSIS — M4644 Discitis, unspecified, thoracic region: Secondary | ICD-10-CM | POA: Diagnosis not present

## 2022-04-13 DIAGNOSIS — M5136 Other intervertebral disc degeneration, lumbar region: Secondary | ICD-10-CM | POA: Diagnosis not present

## 2022-04-13 DIAGNOSIS — Z96643 Presence of artificial hip joint, bilateral: Secondary | ICD-10-CM | POA: Diagnosis not present

## 2022-04-13 DIAGNOSIS — I10 Essential (primary) hypertension: Secondary | ICD-10-CM | POA: Insufficient documentation

## 2022-04-13 DIAGNOSIS — M5134 Other intervertebral disc degeneration, thoracic region: Secondary | ICD-10-CM | POA: Diagnosis not present

## 2022-04-13 DIAGNOSIS — Z792 Long term (current) use of antibiotics: Secondary | ICD-10-CM | POA: Diagnosis not present

## 2022-04-13 DIAGNOSIS — A4101 Sepsis due to Methicillin susceptible Staphylococcus aureus: Secondary | ICD-10-CM | POA: Diagnosis not present

## 2022-04-13 DIAGNOSIS — E785 Hyperlipidemia, unspecified: Secondary | ICD-10-CM | POA: Diagnosis not present

## 2022-04-13 DIAGNOSIS — M47816 Spondylosis without myelopathy or radiculopathy, lumbar region: Secondary | ICD-10-CM | POA: Diagnosis not present

## 2022-04-13 DIAGNOSIS — M4646 Discitis, unspecified, lumbar region: Secondary | ICD-10-CM | POA: Diagnosis not present

## 2022-04-13 DIAGNOSIS — Z452 Encounter for adjustment and management of vascular access device: Secondary | ICD-10-CM | POA: Diagnosis not present

## 2022-04-13 DIAGNOSIS — K219 Gastro-esophageal reflux disease without esophagitis: Secondary | ICD-10-CM | POA: Diagnosis not present

## 2022-04-13 DIAGNOSIS — G992 Myelopathy in diseases classified elsewhere: Secondary | ICD-10-CM | POA: Diagnosis not present

## 2022-04-13 DIAGNOSIS — Z7984 Long term (current) use of oral hypoglycemic drugs: Secondary | ICD-10-CM | POA: Diagnosis not present

## 2022-04-13 DIAGNOSIS — M47814 Spondylosis without myelopathy or radiculopathy, thoracic region: Secondary | ICD-10-CM | POA: Diagnosis not present

## 2022-04-13 HISTORY — PX: LUMBAR LAMINECTOMY/DECOMPRESSION MICRODISCECTOMY: SHX5026

## 2022-04-13 LAB — GLUCOSE, CAPILLARY
Glucose-Capillary: 85 mg/dL (ref 70–99)
Glucose-Capillary: 73 mg/dL (ref 70–99)
Glucose-Capillary: 93 mg/dL (ref 70–99)
Glucose-Capillary: 183 mg/dL — ABNORMAL HIGH (ref 70–99)

## 2022-04-13 SURGERY — LUMBAR LAMINECTOMY/DECOMPRESSION MICRODISCECTOMY 4 LEVEL
Anesthesia: General | Site: Spine Thoracic

## 2022-04-13 MED ORDER — MORPHINE SULFATE (PF) 4 MG/ML IV SOLN: 4.0000 mg | INTRAVENOUS | Status: DC | PRN

## 2022-04-13 MED ORDER — PHENOL 1.4 % MT LIQD: 1.0000 | OROMUCOSAL | Status: DC | PRN

## 2022-04-13 MED ORDER — MENTHOL 3 MG MT LOZG: 1.0000 | LOZENGE | OROMUCOSAL | Status: DC | PRN

## 2022-04-13 MED ORDER — ONDANSETRON HCL 4 MG PO TABS: 4.0000 mg | ORAL_TABLET | Freq: Four times a day (QID) | ORAL | Status: DC | PRN

## 2022-04-13 MED ORDER — BUMETANIDE 1 MG PO TABS
1.0000 mg | ORAL_TABLET | Freq: Two times a day (BID) | ORAL | Status: DC
  Administered 2022-04-14: 1 mg via ORAL
  Filled 2022-04-13 (×3): qty 1

## 2022-04-13 MED ORDER — CHLORHEXIDINE GLUCONATE 0.12 % MT SOLN: 15.0000 mL | Freq: Once | OROMUCOSAL | Status: AC

## 2022-04-13 MED ORDER — LABETALOL HCL 5 MG/ML IV SOLN
10.0000 mg | Freq: Once | INTRAVENOUS | Status: AC
  Administered 2022-04-13: 10 mg via INTRAVENOUS

## 2022-04-13 MED ORDER — LACTATED RINGERS IV SOLN: INTRAVENOUS | Status: DC | PRN

## 2022-04-13 MED ORDER — MECLIZINE HCL 12.5 MG PO TABS: 12.5000 mg | ORAL_TABLET | Freq: Two times a day (BID) | ORAL | Status: DC | PRN

## 2022-04-13 MED ORDER — THROMBIN 5000 UNITS EX SOLR
CUTANEOUS | Status: AC
  Filled 2022-04-13: qty 5000

## 2022-04-13 MED ORDER — 0.9 % SODIUM CHLORIDE (POUR BTL) OPTIME
TOPICAL | Status: DC | PRN
  Administered 2022-04-13: 1000 mL

## 2022-04-13 MED ORDER — ONDANSETRON HCL 4 MG/2ML IJ SOLN
INTRAMUSCULAR | Status: DC | PRN
Start: 2022-04-13 — End: 2022-04-13
  Administered 2022-04-13: 4 mg via INTRAVENOUS

## 2022-04-13 MED ORDER — LORATADINE 10 MG PO TABS
10.0000 mg | ORAL_TABLET | Freq: Every day | ORAL | Status: DC
Start: 2022-04-14 — End: 2022-04-14

## 2022-04-13 MED ORDER — UMECLIDINIUM BROMIDE 62.5 MCG/ACT IN AEPB
1.0000 | INHALATION_SPRAY | Freq: Every day | RESPIRATORY_TRACT | Status: DC
  Filled 2022-04-13: qty 7

## 2022-04-13 MED ORDER — PROPOFOL 10 MG/ML IV BOLUS
INTRAVENOUS | Status: AC
  Filled 2022-04-13: qty 20

## 2022-04-13 MED ORDER — HEPARIN SOD (PORK) LOCK FLUSH 100 UNIT/ML IV SOLN: 250.0000 [IU] | INTRAVENOUS | Status: DC | PRN

## 2022-04-13 MED ORDER — BISACODYL 10 MG RE SUPP
10.0000 mg | Freq: Every day | RECTAL | Status: DC | PRN
Start: 2022-04-13 — End: 2022-04-14

## 2022-04-13 MED ORDER — DEXMEDETOMIDINE (PRECEDEX) IN NS 20 MCG/5ML (4 MCG/ML) IV SYRINGE
PREFILLED_SYRINGE | INTRAVENOUS | Status: DC | PRN
  Administered 2022-04-13: 8 ug via INTRAVENOUS

## 2022-04-13 MED ORDER — METFORMIN HCL ER 500 MG PO TB24
500.0000 mg | ORAL_TABLET | Freq: Every day | ORAL | Status: DC
  Administered 2022-04-14: 500 mg via ORAL
  Filled 2022-04-13: qty 1

## 2022-04-13 MED ORDER — PROPOFOL 10 MG/ML IV BOLUS
INTRAVENOUS | Status: DC | PRN
  Administered 2022-04-13: 70 mg via INTRAVENOUS

## 2022-04-13 MED ORDER — ZOLPIDEM TARTRATE 5 MG PO TABS: 5.0000 mg | ORAL_TABLET | Freq: Every evening | ORAL | Status: DC | PRN

## 2022-04-13 MED ORDER — FLUTICASONE FUROATE-VILANTEROL 200-25 MCG/ACT IN AEPB
1.0000 | INHALATION_SPRAY | Freq: Every day | RESPIRATORY_TRACT | Status: DC
Start: 2022-04-14 — End: 2022-04-14
  Filled 2022-04-13: qty 28

## 2022-04-13 MED ORDER — ACETAMINOPHEN 650 MG RE SUPP
650.0000 mg | RECTAL | Status: DC | PRN
Start: 2022-04-13 — End: 2022-04-14

## 2022-04-13 MED ORDER — ORAL CARE MOUTH RINSE: 15.0000 mL | Freq: Once | OROMUCOSAL | Status: AC

## 2022-04-13 MED ORDER — BACITRACIN ZINC 500 UNIT/GM EX OINT
TOPICAL_OINTMENT | CUTANEOUS | Status: DC | PRN
  Administered 2022-04-13: 1 via TOPICAL

## 2022-04-13 MED ORDER — ACETAMINOPHEN 10 MG/ML IV SOLN: 1000.0000 mg | Freq: Once | INTRAVENOUS | Status: DC | PRN

## 2022-04-13 MED ORDER — CHLORHEXIDINE GLUCONATE CLOTH 2 % EX PADS: 6.0000 | MEDICATED_PAD | Freq: Once | CUTANEOUS | Status: DC

## 2022-04-13 MED ORDER — LACTATED RINGERS IV SOLN: INTRAVENOUS | Status: DC

## 2022-04-13 MED ORDER — POTASSIUM CHLORIDE CRYS ER 20 MEQ PO TBCR
20.0000 meq | EXTENDED_RELEASE_TABLET | Freq: Every day | ORAL | Status: DC
  Administered 2022-04-13: 20 meq via ORAL
  Filled 2022-04-13: qty 1

## 2022-04-13 MED ORDER — CEFAZOLIN SODIUM-DEXTROSE 2-4 GM/100ML-% IV SOLN
INTRAVENOUS | Status: AC
  Filled 2022-04-13: qty 100

## 2022-04-13 MED ORDER — ROCURONIUM BROMIDE 10 MG/ML (PF) SYRINGE
PREFILLED_SYRINGE | INTRAVENOUS | Status: AC
  Filled 2022-04-13: qty 10

## 2022-04-13 MED ORDER — DOCUSATE SODIUM 100 MG PO CAPS
100.0000 mg | ORAL_CAPSULE | Freq: Two times a day (BID) | ORAL | Status: DC
  Administered 2022-04-13 – 2022-04-14 (×2): 100 mg via ORAL
  Filled 2022-04-13 (×2): qty 1

## 2022-04-13 MED ORDER — PRAVASTATIN SODIUM 40 MG PO TABS: 40.0000 mg | ORAL_TABLET | Freq: Every day | ORAL | Status: DC

## 2022-04-13 MED ORDER — ACETAMINOPHEN 325 MG PO TABS
650.0000 mg | ORAL_TABLET | ORAL | Status: DC | PRN
Start: 2022-04-13 — End: 2022-04-14

## 2022-04-13 MED ORDER — SODIUM CHLORIDE 0.9% FLUSH
3.0000 mL | Freq: Two times a day (BID) | INTRAVENOUS | Status: DC
  Administered 2022-04-14: 3 mL via INTRAVENOUS

## 2022-04-13 MED ORDER — GELATIN ABSORBABLE MT POWD
OROMUCOSAL | Status: DC | PRN
  Administered 2022-04-13 (×2): 5 mL via TOPICAL

## 2022-04-13 MED ORDER — CHLORHEXIDINE GLUCONATE 0.12 % MT SOLN
OROMUCOSAL | Status: AC
  Administered 2022-04-13: 15 mL via OROMUCOSAL
  Filled 2022-04-13: qty 15

## 2022-04-13 MED ORDER — SODIUM CHLORIDE 0.9% FLUSH: 3.0000 mL | INTRAVENOUS | Status: DC | PRN

## 2022-04-13 MED ORDER — LABETALOL HCL 5 MG/ML IV SOLN
INTRAVENOUS | Status: AC
  Filled 2022-04-13: qty 4

## 2022-04-13 MED ORDER — FENTANYL CITRATE (PF) 100 MCG/2ML IJ SOLN
INTRAMUSCULAR | Status: AC
Start: 2022-04-13 — End: 2022-04-14
  Filled 2022-04-13: qty 2

## 2022-04-13 MED ORDER — LIDOCAINE 2% (20 MG/ML) 5 ML SYRINGE
INTRAMUSCULAR | Status: DC | PRN
  Administered 2022-04-13: 60 mg via INTRAVENOUS
  Administered 2022-04-13: 40 mg via INTRAVENOUS

## 2022-04-13 MED ORDER — BACITRACIN ZINC 500 UNIT/GM EX OINT
TOPICAL_OINTMENT | CUTANEOUS | Status: AC
  Filled 2022-04-13: qty 28.35

## 2022-04-13 MED ORDER — SODIUM CHLORIDE 0.9 % IV SOLN: 250.0000 mL | INTRAVENOUS | Status: DC

## 2022-04-13 MED ORDER — HYDROMORPHONE HCL 1 MG/ML IJ SOLN
INTRAMUSCULAR | Status: DC | PRN
  Administered 2022-04-13 (×2): .25 mg via INTRAVENOUS

## 2022-04-13 MED ORDER — FENTANYL CITRATE (PF) 250 MCG/5ML IJ SOLN
INTRAMUSCULAR | Status: DC | PRN
Start: 2022-04-13 — End: 2022-04-13
  Administered 2022-04-13 (×2): 50 ug via INTRAVENOUS
  Administered 2022-04-13: 100 ug via INTRAVENOUS
  Administered 2022-04-13: 50 ug via INTRAVENOUS

## 2022-04-13 MED ORDER — DILTIAZEM HCL ER COATED BEADS 180 MG PO CP24
180.0000 mg | ORAL_CAPSULE | Freq: Every day | ORAL | Status: DC
  Administered 2022-04-14: 180 mg via ORAL
  Filled 2022-04-13: qty 1

## 2022-04-13 MED ORDER — ACETAMINOPHEN 10 MG/ML IV SOLN
INTRAVENOUS | Status: DC | PRN
  Administered 2022-04-13: 1000 mg via INTRAVENOUS

## 2022-04-13 MED ORDER — PANTOPRAZOLE SODIUM 40 MG PO TBEC
40.0000 mg | DELAYED_RELEASE_TABLET | Freq: Every day | ORAL | Status: DC | PRN
Start: 2022-04-13 — End: 2022-04-14
  Administered 2022-04-14: 40 mg via ORAL
  Filled 2022-04-13: qty 1

## 2022-04-13 MED ORDER — SALINE SPRAY 0.65 % NA SOLN: 1.0000 | Freq: Four times a day (QID) | NASAL | Status: DC | PRN

## 2022-04-13 MED ORDER — ACETAMINOPHEN 10 MG/ML IV SOLN
INTRAVENOUS | Status: AC
  Filled 2022-04-13: qty 100

## 2022-04-13 MED ORDER — HYDROXYZINE HCL 10 MG PO TABS: 10.0000 mg | ORAL_TABLET | Freq: Two times a day (BID) | ORAL | Status: DC | PRN

## 2022-04-13 MED ORDER — THROMBIN 20000 UNITS EX SOLR
CUTANEOUS | Status: AC
  Filled 2022-04-13: qty 20000

## 2022-04-13 MED ORDER — FENTANYL CITRATE (PF) 100 MCG/2ML IJ SOLN
25.0000 ug | INTRAMUSCULAR | Status: DC | PRN
  Administered 2022-04-13: 50 ug via INTRAVENOUS

## 2022-04-13 MED ORDER — CYCLOBENZAPRINE HCL 10 MG PO TABS
10.0000 mg | ORAL_TABLET | Freq: Three times a day (TID) | ORAL | Status: DC | PRN
  Administered 2022-04-13: 10 mg via ORAL
  Filled 2022-04-13: qty 1

## 2022-04-13 MED ORDER — CEFAZOLIN SODIUM-DEXTROSE 2-4 GM/100ML-% IV SOLN
2.0000 g | Freq: Three times a day (TID) | INTRAVENOUS | Status: AC
  Administered 2022-04-13 – 2022-04-14 (×2): 2 g via INTRAVENOUS
  Filled 2022-04-13 (×2): qty 100

## 2022-04-13 MED ORDER — LIDOCAINE 2% (20 MG/ML) 5 ML SYRINGE
INTRAMUSCULAR | Status: AC
  Filled 2022-04-13: qty 5

## 2022-04-13 MED ORDER — HYDROMORPHONE HCL 1 MG/ML IJ SOLN
INTRAMUSCULAR | Status: AC
  Filled 2022-04-13: qty 0.5

## 2022-04-13 MED ORDER — BUPIVACAINE-EPINEPHRINE 0.5% -1:200000 IJ SOLN
INTRAMUSCULAR | Status: AC
  Filled 2022-04-13: qty 1

## 2022-04-13 MED ORDER — OXYCODONE HCL 5 MG PO TABS: 5.0000 mg | ORAL_TABLET | ORAL | Status: DC | PRN

## 2022-04-13 MED ORDER — ALBUTEROL SULFATE HFA 108 (90 BASE) MCG/ACT IN AERS: 2.0000 | INHALATION_SPRAY | Freq: Four times a day (QID) | RESPIRATORY_TRACT | Status: DC | PRN

## 2022-04-13 MED ORDER — CEFAZOLIN SODIUM-DEXTROSE 2-4 GM/100ML-% IV SOLN: 2.0000 g | INTRAVENOUS | Status: AC

## 2022-04-13 MED ORDER — DEXAMETHASONE SODIUM PHOSPHATE 10 MG/ML IJ SOLN
INTRAMUSCULAR | Status: DC | PRN
  Administered 2022-04-13: 10 mg via INTRAVENOUS

## 2022-04-13 MED ORDER — DEXAMETHASONE SODIUM PHOSPHATE 10 MG/ML IJ SOLN
INTRAMUSCULAR | Status: AC
  Filled 2022-04-13: qty 1

## 2022-04-13 MED ORDER — ONDANSETRON HCL 4 MG/2ML IJ SOLN: 4.0000 mg | Freq: Four times a day (QID) | INTRAMUSCULAR | Status: DC | PRN

## 2022-04-13 MED ORDER — METHOCARBAMOL 500 MG PO TABS
500.0000 mg | ORAL_TABLET | Freq: Two times a day (BID) | ORAL | Status: DC
  Administered 2022-04-13 – 2022-04-14 (×2): 500 mg via ORAL
  Filled 2022-04-13 (×2): qty 1

## 2022-04-13 MED ORDER — ACETAMINOPHEN 500 MG PO TABS
1000.0000 mg | ORAL_TABLET | Freq: Four times a day (QID) | ORAL | Status: DC
  Administered 2022-04-13 – 2022-04-14 (×2): 1000 mg via ORAL
  Filled 2022-04-13 (×2): qty 2

## 2022-04-13 MED ORDER — ROCURONIUM BROMIDE 10 MG/ML (PF) SYRINGE
PREFILLED_SYRINGE | INTRAVENOUS | Status: DC | PRN
  Administered 2022-04-13: 50 mg via INTRAVENOUS
  Administered 2022-04-13: 30 mg via INTRAVENOUS
  Administered 2022-04-13: 10 mg via INTRAVENOUS

## 2022-04-13 MED ORDER — GABAPENTIN 300 MG PO CAPS
300.0000 mg | ORAL_CAPSULE | Freq: Every day | ORAL | Status: DC
  Administered 2022-04-13: 300 mg via ORAL
  Filled 2022-04-13: qty 1

## 2022-04-13 MED ORDER — BUPIVACAINE-EPINEPHRINE (PF) 0.5% -1:200000 IJ SOLN
INTRAMUSCULAR | Status: DC | PRN
  Administered 2022-04-13: 10 mL

## 2022-04-13 MED ORDER — CEFAZOLIN SODIUM-DEXTROSE 2-3 GM-%(50ML) IV SOLR
INTRAVENOUS | Status: DC | PRN
  Administered 2022-04-13: 2 g via INTRAVENOUS

## 2022-04-13 MED ORDER — ALBUTEROL SULFATE (2.5 MG/3ML) 0.083% IN NEBU: 2.5000 mg | INHALATION_SOLUTION | RESPIRATORY_TRACT | Status: DC | PRN

## 2022-04-13 MED ORDER — ONDANSETRON HCL 4 MG/2ML IJ SOLN
INTRAMUSCULAR | Status: AC
  Filled 2022-04-13: qty 2

## 2022-04-13 MED ORDER — OXYCODONE HCL 5 MG PO TABS
10.0000 mg | ORAL_TABLET | ORAL | Status: DC | PRN
  Administered 2022-04-13 – 2022-04-14 (×4): 10 mg via ORAL
  Filled 2022-04-13 (×4): qty 2

## 2022-04-13 MED ORDER — FENTANYL CITRATE (PF) 250 MCG/5ML IJ SOLN
INTRAMUSCULAR | Status: AC
  Filled 2022-04-13: qty 5

## 2022-04-13 SURGICAL SUPPLY — 59 items
APL SKNCLS STERI-STRIP NONHPOA (GAUZE/BANDAGES/DRESSINGS) ×1
BAG COUNTER SPONGE SURGICOUNT (BAG) ×2 IMPLANT
BAG SPNG CNTER NS LX DISP (BAG) ×1
BAND INSRT 18 STRL LF DISP RB (MISCELLANEOUS) ×2
BAND RUBBER #18 3X1/16 STRL (MISCELLANEOUS) ×4 IMPLANT
BENZOIN TINCTURE PRP APPL 2/3 (GAUZE/BANDAGES/DRESSINGS) ×2 IMPLANT
BLADE CLIPPER SURG (BLADE) IMPLANT
BUR MATCHSTICK NEURO 3.0 LAGG (BURR) ×2 IMPLANT
BUR PRECISION FLUTE 6.0 (BURR) ×2 IMPLANT
CANISTER SUCT 3000ML PPV (MISCELLANEOUS) ×2 IMPLANT
CARTRIDGE OIL MAESTRO DRILL (MISCELLANEOUS) ×1 IMPLANT
CLSR STERI-STRIP ANTIMIC 1/2X4 (GAUZE/BANDAGES/DRESSINGS) ×1 IMPLANT
DIFFUSER DRILL AIR PNEUMATIC (MISCELLANEOUS) ×2 IMPLANT
DRAPE HALF SHEET 40X57 (DRAPES) ×2 IMPLANT
DRAPE LAPAROTOMY 100X72X124 (DRAPES) ×2 IMPLANT
DRAPE MICROSCOPE LEICA (MISCELLANEOUS) ×2 IMPLANT
DRAPE SURG 17X23 STRL (DRAPES) ×5 IMPLANT
DRSG OPSITE POSTOP 4X8 (GAUZE/BANDAGES/DRESSINGS) ×1 IMPLANT
ELECT BLADE 4.0 EZ CLEAN MEGAD (MISCELLANEOUS) ×2
ELECT REM PT RETURN 9FT ADLT (ELECTROSURGICAL) ×2
ELECTRODE BLDE 4.0 EZ CLN MEGD (MISCELLANEOUS) ×1 IMPLANT
ELECTRODE REM PT RTRN 9FT ADLT (ELECTROSURGICAL) ×1 IMPLANT
EVACUATOR 1/8 PVC DRAIN (DRAIN) ×1 IMPLANT
GAUZE 4X4 16PLY ~~LOC~~+RFID DBL (SPONGE) IMPLANT
GAUZE SPONGE 4X4 12PLY STRL (GAUZE/BANDAGES/DRESSINGS) ×2 IMPLANT
GLOVE BIO SURGEON STRL SZ 6.5 (GLOVE) ×1 IMPLANT
GLOVE BIO SURGEON STRL SZ7 (GLOVE) ×1 IMPLANT
GLOVE BIO SURGEON STRL SZ8 (GLOVE) ×3 IMPLANT
GLOVE BIO SURGEON STRL SZ8.5 (GLOVE) ×3 IMPLANT
GLOVE BIOGEL PI IND STRL 6.5 (GLOVE) IMPLANT
GLOVE BIOGEL PI INDICATOR 6.5 (GLOVE) ×2
GLOVE EXAM NITRILE XL STR (GLOVE) IMPLANT
GOWN STRL REUS W/ TWL LRG LVL3 (GOWN DISPOSABLE) IMPLANT
GOWN STRL REUS W/ TWL XL LVL3 (GOWN DISPOSABLE) ×1 IMPLANT
GOWN STRL REUS W/TWL 2XL LVL3 (GOWN DISPOSABLE) IMPLANT
GOWN STRL REUS W/TWL LRG LVL3 (GOWN DISPOSABLE) ×2
GOWN STRL REUS W/TWL XL LVL3 (GOWN DISPOSABLE) ×6
HEMOSTAT POWDER KIT SURGIFOAM (HEMOSTASIS) ×2 IMPLANT
KIT BASIN OR (CUSTOM PROCEDURE TRAY) ×2 IMPLANT
KIT TURNOVER KIT B (KITS) ×2 IMPLANT
NDL HYPO 21X1.5 SAFETY (NEEDLE) IMPLANT
NEEDLE HYPO 21X1.5 SAFETY (NEEDLE) IMPLANT
NEEDLE HYPO 22GX1.5 SAFETY (NEEDLE) ×2 IMPLANT
NS IRRIG 1000ML POUR BTL (IV SOLUTION) ×2 IMPLANT
OIL CARTRIDGE MAESTRO DRILL (MISCELLANEOUS) ×2
PACK LAMINECTOMY NEURO (CUSTOM PROCEDURE TRAY) ×2 IMPLANT
PAD ARMBOARD 7.5X6 YLW CONV (MISCELLANEOUS) ×6 IMPLANT
PATTIES SURGICAL .5 X1 (DISPOSABLE) IMPLANT
SPONGE NEURO XRAY DETECT 1X3 (DISPOSABLE) ×1 IMPLANT
SPONGE SURGIFOAM ABS GEL 100 (HEMOSTASIS) ×1 IMPLANT
STRIP CLOSURE SKIN 1/2X4 (GAUZE/BANDAGES/DRESSINGS) ×2 IMPLANT
SUT VIC AB 1 CT1 18XBRD ANBCTR (SUTURE) ×2 IMPLANT
SUT VIC AB 1 CT1 8-18 (SUTURE) ×2
SUT VIC AB 2-0 CP2 18 (SUTURE) ×3 IMPLANT
SWAB COLLECTION DEVICE MRSA (MISCELLANEOUS) ×1 IMPLANT
SWAB CULTURE ESWAB REG 1ML (MISCELLANEOUS) ×1 IMPLANT
TOWEL GREEN STERILE (TOWEL DISPOSABLE) ×2 IMPLANT
TOWEL GREEN STERILE FF (TOWEL DISPOSABLE) ×2 IMPLANT
WATER STERILE IRR 1000ML POUR (IV SOLUTION) ×2 IMPLANT

## 2022-04-13 NOTE — H&P (Signed)
Subjective: The patient is a 76 year old black female who presented with a fracture spine pain and a thoracic epidural abscess.  She was treated with antibiotics.  She has failed to improve.  A follow-up thoracic MRI demonstrated a persistent thoracic epidural abscess, osteomyelitis, etc.  I discussed the various treatment options with her.  She has decided to proceed with surgery.  Past Medical History:  Diagnosis Date   Arthritis    "knees, ankles, fingers" (07/06/2018)   Asthma    Chronic lower back pain    Dyspnea    with asthma attacks    Dysrhythmia    Family history of adverse reaction to anesthesia    my first cousin had difficulty waking up    Fibroid    ovarian   GERD (gastroesophageal reflux disease)    Hip osteoarthritis    Left   History of staph infection    in hospital for 11 days/ in 2007   Hyperlipidemia    Hypertension    Radiculopathy    Tremor 03/30/2022   Type II diabetes mellitus ([place])    Upper extremity weakness 03/30/2022   Wears glasses     Past Surgical History:  Procedure Laterality Date   ANTERIOR CERVICAL DECOMP/DISCECTOMY FUSION N/A 12/11/2015   Procedure: ANTERIOR CERVICAL DECOMPRESSION/DISCECTOMY FUSION 2 LEVELS;  Surgeon: Phylliss Bob, MD;  Location: [facility];  Service: Orthopedics;  Laterality: N/A;  Anterior cervical decompression fusion, cervical 6-7, cervical 7-thoracic 1 with instrumentation and allograft   BACK SURGERY     CARDIAC CATHETERIZATION  1998   CATARACT EXTRACTION W/ INTRAOCULAR LENS  IMPLANT, BILATERAL Bilateral 2015   Bil   COLONOSCOPY     CRYOABLATION  1988   "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"   INCISION AND DRAINAGE Bilateral 2007>   "cleaned staph out of shoulders"   IR FLUORO GUIDED NEEDLE PLC ASPIRATION/INJECTION LOC  01/27/2022   IR FLUORO GUIDED NEEDLE PLC ASPIRATION/INJECTION LOC  01/27/2022   JOINT REPLACEMENT     LAPAROSCOPIC OVARIAN CYSTECTOMY  1970   LUMBAR LAMINECTOMY/DECOMPRESSION MICRODISCECTOMY  N/A 01/31/2018   Procedure: LAMINECTOMY AND FORAMINOTOMY LUMBAR TWO- LUMBAR THREE, LUMBAR THREE- LUMBAR FOUR, LUMBAR FOUR- LUMBAR FIVE ;  Surgeon: Newman Pies, MD;  Location: [facility];  Service: Neurosurgery;  Laterality: N/A;   POSTERIOR CERVICAL LAMINECTOMY  ~ [place] Right 02/2003   THUMB FUSION Right 2010   right thumb   TONSILLECTOMY     TOTAL ABDOMINAL HYSTERECTOMY  1998   TAH,BSO   TOTAL HIP ARTHROPLASTY Right 05/18/2018   Procedure: TOTAL HIP ARTHROPLASTY ANTERIOR APPROACH;  Surgeon: Frederik Pear, MD;  Location: [facility];  Service: Orthopedics;  Laterality: Right;   TOTAL HIP ARTHROPLASTY Left 07/06/2018   TOTAL HIP ARTHROPLASTY Left 07/06/2018   Procedure: LEFT TOTAL HIP ARTHROPLASTY ANTERIOR APPROACH;  Surgeon: Frederik Pear, MD;  Location: [facility];  Service: Orthopedics;  Laterality: Left;  Needs RNFA   TUBAL LIGATION      Allergies  Allergen Reactions   Ivp Dye [Iodinated Contrast Media] Nausea And Vomiting   Prednisone Other (See Comments)    reflux    Social History   Tobacco Use   Smoking status: Former    Packs/day: 1.00    Years: 25.00    Pack years: 25.00    Types: Cigarettes    Quit date: 11/23/1992    Years since quitting: 29.4   Smokeless tobacco: Never  Substance Use Topics   Alcohol use: Not  Currently    Family History  Problem Relation Age of Onset   Diabetes Sister    Diabetes Brother    Diabetes Brother    Diabetes Paternal Uncle    Cancer Paternal Aunt        UTERINE   Hypertension Maternal Grandmother    Heart disease Maternal Grandmother    Colon cancer Neg Hx    Prior to Admission medications   Medication Sig Start Date End Date Taking? Authorizing Provider  acetaminophen (TYLENOL) 500 MG tablet Take 1,000-1,500 mg by mouth daily as needed for moderate pain or headache.   Yes [provider]  bumetanide (BUMEX) 1 MG tablet Take 1 mg by mouth 2 (two) times daily. 07/16/20  Yes [provider]   cetirizine (ZYRTEC) 10 MG tablet Take 10 mg by mouth daily.   Yes [provider]  cholecalciferol (VITAMIN D3) 25 MCG (1000 UNIT) tablet Take 1,000 Units by mouth daily.   Yes [provider]  diclofenac (VOLTAREN) 75 MG EC tablet Take 1 tablet by mouth daily as needed for mild pain. 12/31/18  Yes [provider]  diclofenac Sodium (VOLTAREN) 1 % GEL Apply 4 g topically 4 (four) times daily. Patient taking differently: Apply 4 g topically daily as needed (Knee pain). 01/25/22  Yes Deno Etienne, DO  diltiazem (CARDIZEM CD) 180 MG 24 hr capsule Take 180 mg by mouth daily. 10/08/21  Yes [provider]  enalapril (VASOTEC) 20 MG tablet Take 20 mg by mouth 2 (two) times daily. 08/05/13  Yes [provider]  folic acid (FOLVITE) 629 MCG tablet Take 800 mcg by mouth daily.   Yes [provider]  gabapentin (NEURONTIN) 300 MG capsule Take 300 mg by mouth at bedtime.   Yes [provider]  hydrOXYzine (ATARAX) 10 MG tablet Take 10-20 mg by mouth 2 (two) times daily as needed for anxiety.   Yes [provider]  lovastatin (MEVACOR) 40 MG tablet Take 40 mg by mouth at bedtime.  04/10/14  Yes [provider]  metFORMIN (GLUCOPHAGE-XR) 500 MG 24 hr tablet Take 500 mg by mouth in the morning and at bedtime. 11/11/17  Yes [provider]  methocarbamol (ROBAXIN) 500 MG tablet Take 1 tablet (500 mg total) by mouth 2 (two) times daily. Patient taking differently: Take 500 mg by mouth in the morning and at bedtime. 01/25/22  Yes Deno Etienne, DO  Multiple Vitamin (MULTIVITAMIN WITH MINERALS) TABS tablet Take 1 tablet by mouth daily.   Yes [provider]  omeprazole (PRILOSEC OTC) 20 MG tablet Take 20 mg by mouth daily as needed (heartburn).   Yes [provider]  Potassium Chloride ER 20 MEQ TBCR 20 mEq daily. 03/12/22  Yes [provider]  sodium chloride (OCEAN) 0.65 % SOLN nasal spray Place 1-2 sprays into  the nose 4 (four) times daily as needed for congestion.   Yes [provider]  Donnal Debar 200-62.5-25 MCG/ACT AEPB Take 1 puff by mouth daily. 10/13/21  Yes [provider]  albuterol (ACCUNEB) 1.25 MG/3ML nebulizer solution Take 1 ampule by nebulization every 4 (four) hours as needed for wheezing. 02/06/21   [provider]  albuterol (VENTOLIN HFA) 108 (90 Base) MCG/ACT inhaler Inhale 2 puffs into the lungs every 6 (six) hours as needed for wheezing or shortness of breath.    [provider]  betamethasone dipropionate 0.05 % cream Apply 1 application. topically daily as needed (EAR).    [provider]  Blood Glucose Monitoring Suppl (ONE TOUCH ULTRA 2) w/Device KIT Check blood sugar twice per week 06/14/19   [provider]  docusate sodium (COLACE) 100 MG capsule Take 100 mg by mouth daily as needed for mild constipation.    [provider]  glucose blood test strip  02/14/19   [provider]  Lancets [facility] DELICA PLUS [id number]) MISC Use to check blood sugar 06/14/19   [provider]  meclizine (ANTIVERT) 12.5 MG tablet Take 12.5 mg by mouth 2 (two) times daily as needed for dizziness. 09/19/19   [provider]  [facility] ULTRA test strip  11/20/20   [provider]     Review of Systems  Positive ROS: As above  All other systems have been reviewed and were otherwise negative with the exception of those mentioned in the HPI and as above.  Objective: Vital signs in last 24 hours: Temp:  [98.4 F (36.9 C)] 98.4 F (36.9 C) (05/22 1200) Pulse Rate:  [78] 78 (05/22 1200) Resp:  [18] 18 (05/22 1200) BP: (115)/(65) 115/65 (05/22 1200) SpO2:  [93 %] 93 % (05/22 1200) Weight:  [62.1 kg] 62.1 kg (05/22 1200) Estimated body mass index is 30.71 kg/m as calculated from the following:   Height as of this encounter: '4\' 8"'  (1.422 m).   Weight as of this encounter: 62.1 kg.   General  Appearance: Alert Head: Normocephalic, without obvious abnormality, atraumatic Eyes: PERRL, conjunctiva/corneas clear, EOM's intact,    Ears: Normal  Throat: Normal  Neck: Supple, Back: Her lumbar incision is well-healed. Lungs: Clear to auscultation bilaterally, respirations unlabored Heart: Regular rate and rhythm, no murmur, rub or gallop Abdomen: Soft, non-tender Extremities: Extremities normal, atraumatic, no cyanosis or edema Skin: unremarkable  NEUROLOGIC:   Mental status: alert and oriented,Motor Exam - grossly normal Sensory Exam - grossly normal Reflexes:  Coordination - grossly normal Gait - grossly normal Balance - grossly normal Cranial Nerves: I: smell Not tested  II: visual acuity  OS: Normal  OD: Normal   II: visual fields Full to confrontation  II: pupils Equal, round, reactive to light  III,VII: ptosis None  III,IV,VI: extraocular muscles  Full ROM  V: mastication Normal  V: facial light touch sensation  Normal  V,VII: corneal reflex  Present  VII: facial muscle function - upper  Normal  VII: facial muscle function - lower Normal  VIII: hearing Not tested  IX: soft palate elevation  Normal  IX,X: gag reflex Present  XI: trapezius strength  5/5  XI: sternocleidomastoid strength 5/5  XI: neck flexion strength  5/5  XII: tongue strength  Normal    Data Review Lab Results  Component Value Date   WBC 7.5 04/09/2022   HGB 11.7 (L) 04/09/2022   HCT 37.5 04/09/2022   MCV 87.4 04/09/2022   PLT 341 04/09/2022   Lab Results  Component Value Date   NA 138 04/09/2022   K 4.6 04/09/2022   CL 100 04/09/2022   CO2 28 04/09/2022   BUN 26 (H) 04/09/2022   CREATININE 0.93 04/09/2022   GLUCOSE 88 04/09/2022   Lab Results  Component Value Date   INR 1.1 01/26/2022    Assessment/Plan: Thoracic epidural abscess, osteomyelitis, thoracic myelopathy, thoracic spine pain: I have discussed the situation with the patient.  I reviewed her MRI scan with her and  pointed out the abnormalities.  We have discussed the various treatment options including surgery.  I have described the surgical treatment option of a thoracic  laminectomy.  I have shown her surgical models.  I have given her a surgical pamphlet.  We have discussed the risk, benefits, alternatives, expected postop course, and likelihood of achieving our goals with surgery.  I have answered all her questions.  She has decided proceed with surgery.   Ophelia Charter 04/13/2022 1:49 PM

## 2022-04-13 NOTE — Progress Notes (Signed)
Subjective: The patient is alert and pleasant.  She looks well.  She is in no apparent distress.  Objective: Vital signs in last 24 hours: Temp:  [98.4 F (36.9 C)] 98.4 F (36.9 C) (05/22 1200) Pulse Rate:  [78] 78 (05/22 1200) Resp:  [18] 18 (05/22 1200) BP: (115)/(65) 115/65 (05/22 1200) SpO2:  [93 %] 93 % (05/22 1200) Weight:  [62.1 kg] 62.1 kg (05/22 1200) Estimated body mass index is 30.71 kg/m as calculated from the following:   Height as of this encounter: '4\' 8"'$  (1.422 m).   Weight as of this encounter: 62.1 kg.   Intake/Output from previous day: No intake/output data recorded. Intake/Output this shift: Total I/O In: 1550 [I.V.:1400; IV Piggyback:150] Out: 150 [Blood:150]  Physical exam the patient is alert and pleasant.  She is moving her lower extremities well.  Lab Results: No results for input(s): WBC, HGB, HCT, PLT in the last 72 hours. BMET No results for input(s): NA, K, CL, CO2, GLUCOSE, BUN, CREATININE, CALCIUM in the last 72 hours.  Studies/Results: DG Thoracic Spine 2 View  Result Date: 04/13/2022 CLINICAL DATA:  Localization films for T7 through T10 laminectomy. EXAM: THORACIC SPINE 2 VIEWS COMPARISON:  MRI thoracic spine 03/26/2022 FINDINGS: Two prone intraoperative images are provided, including the chest and abdomen. On the chest image, an endotracheal tube is present. The tip appears to be in the left mainstem bronchus and can be withdrawn about 2 cm. There is suggestion of atelectasis in the right lung base. There appear to be 4 true lumbar type vertebral bodies with 12 rib-bearing thoracic vertebrae. On the chest view, a localization marker is projected over the T10-11 interspace with retractors and sponge markers present. On the abdominal view, a localization marker is projected horizontally over the T7-8 interspace. There are diffuse degenerative changes shown throughout the thoracic and lumbar spine. IMPRESSION: 1. Intraoperative localization views are  obtained with localization markers projected over the T10-11 and T7-8 interspaces. 2. Note 4 true lumbar type vertebral bodies with 12 rib-bearing thoracic vertebrae. 3. Endotracheal tube tip appears to be in the left mainstem bronchus. These results were called by telephone at the time of interpretation on 04/13/2022 at 3:19 pm to provider Sarah in the [facility] , who verbally acknowledged these results. Electronically Signed   By: Lucienne Capers M.D.   On: 04/13/2022 15:31    Assessment/Plan: The patient is doing well.  I spoke with her daughter.  LOS: 0 days     Ophelia Charter 04/13/2022, 5:09 PM

## 2022-04-13 NOTE — Anesthesia Procedure Notes (Signed)
Procedure Name: Intubation Date/Time: 04/13/2022 2:17 PM Performed by: Rande Brunt, CRNA Pre-anesthesia Checklist: Patient identified, Emergency Drugs available, Suction available and Patient being monitored Patient Re-evaluated:Patient Re-evaluated prior to induction Oxygen Delivery Method: Circle System Utilized Preoxygenation: Pre-oxygenation with 100% oxygen Induction Type: IV induction Ventilation: Mask ventilation without difficulty Laryngoscope Size: Mac and 3 Grade View: Grade I Tube type: Oral Number of attempts: 1 Airway Equipment and Method: Stylet Placement Confirmation: ETT inserted through vocal cords under direct vision, positive ETCO2 and breath sounds checked- equal and bilateral Secured at: 21 cm Tube secured with: Tape Dental Injury: Teeth and Oropharynx as per pre-operative assessment

## 2022-04-13 NOTE — Op Note (Signed)
Brief history: The patient is a 76 year old black female presenting with back pain.  She was found to have a thoracic osteomyelitis/discitis with epidural abscess.  Blood cultures were positive.  The patient was treated with antibiotics per ID.  The patient has completed her course of IV antibiotics and a follow-up MRI scan was obtained which demonstrated worsening of her discitis osteomyelitis and epidural abscess.  I discussed the various treatment options with the patient.  She has decided proceed with surgery.  Preop diagnosis: Thoracic osteomyelitis, discitis, epidural abscess, stenosis, thoracic myelopathy, thoracic spine pain.  Postop diagnosis: The same  Procedure: T7, T8, T9, T10 and T11 laminectomy for drainage of epidural abscess; T7-8, T8-9, T9-10, T10-11 posterior lateral arthrodesis with local morselized autograft bone  Surgeon: Dr. Earle Gell  Assistant: Arnetha Massy, NP  Anesthesia: General tracheal  Estimated blood loss 150 cc  Specimens: Epidural cultures  Drains: 1 medium Hemovac drain in the epidural space  Complications: None  Description of procedure: The patient was brought to the operating room by the anesthesia team.  General endotracheal anesthesia was induced.  She was turned to the prone position with arms tucked by her side on the chest rolls.  The patient's thoracolumbar region was then prepared with Betadine scrub and Betadine solution.  Sterile drapes were applied.  I then injected the area to be incised with Marcaine with epinephrine solution.  I used a scalpel to make a linear midline incision from approximately T7-T11.  I used electrocautery to perform bilateral subperiosteal dissection exposing the spinous process lamina and facets from T7-T11.  We obtained an intraoperative radiograph to confirm our location.  We inserted the cerebellar and we Lander retractors for exposure.  We began the decompression by the soft tissues from the spinous process at T7,  T8, T9, T10 and T11.  We then used a Leksell rongeur to remove the T7, T8, T9, T10 and T11 spinous process.  This bone did not appear to be infected.  We later use it as local morselized autograft bone.  I then used a high-speed drill to perform laminotomies bilaterally at T6-7, T7-8, T8-9, T9-10, T10-11 and T11-12.  I carefully dissected the epidural tissue from the dura.  The epidural tissue was somewhat adherent.  We did not find any obvious purulent material.  We obtain epidural cultures at approximately T7-8.  I then used the Kerrison punches to complete the laminectomy at T6-7, T7-8, T8-9, T9-10, T10-11 and T11-12, decompressing the thecal sac/spinal cord.  We then obtained hemostasis using bipolar cautery.  We irrigated the wound out with saline solution.  Because I was concerned the patient may have developed a kyphosis after her laminectomy we used a high-speed drill to decorticate the facets at T6-7, T7-8, T8-9, T9-10, T10-11 and T11-12.  We then laid local morselized autograft bone over these decorticated structures completing the posterior lateral thesis at these levels.  We then placed a medium Hemovac drain in the epidural space and tunneled it out through a separate stab wound.  We then remove the retractors.  We reapproximated the patient's thoracic fascia with interrupted 0 Vicryl suture.  We reapproximated the subcutaneous tissue with interrupted 2-0 Vicryl suture.  We reapproximate the skin with Steri-Strips and benzoin.  The wound was then coated with bacitracin ointment.  A sterile dressing was applied.  The drapes were removed.  By report all sponge, instrument, and needle counts were correct at the end of this case.

## 2022-04-13 NOTE — Progress Notes (Signed)
Orthopedic Tech Progress Note Patient Details:  [name] June 22, 1946 [id number]  PACU RN called requesting a TLSO BRACE  Ortho Devices Type of Ortho Device: Thoracolumbar corset (TLSO) Ortho Device/Splint Location: BACK Ortho Device/Splint Interventions: Ordered   Post Interventions Patient Tolerated: Well Instructions Provided: [facility] [place] 04/13/2022, 5:34 PM

## 2022-04-13 NOTE — Transfer of Care (Signed)
Immediate Anesthesia Transfer of Care Note  Patient: [name]  Procedure(s) Performed: Thoracic seven,Thoracic eight, Thoracic nine, Thoracic ten LAMINECTOMY (Spine Thoracic)  Patient Location: PACU  Anesthesia Type:General  Level of Consciousness: awake, alert  and oriented  Airway & Oxygen Therapy: Patient Spontanous Breathing  Post-op Assessment: Report given to RN, Post -op Vital signs reviewed and stable and Patient moving all extremities  Post vital signs: Reviewed and stable  Last Vitals:  Vitals Value Taken Time  BP 160/87 04/13/22 1700  Temp    Pulse    Resp 10 04/13/22 1701  SpO2    Vitals shown include unvalidated device data.  Last Pain:  Vitals:   04/13/22 1228  TempSrc:   PainSc: 0-No pain         Complications: No notable events documented.

## 2022-04-14 ENCOUNTER — Encounter (HOSPITAL_COMMUNITY): Payer: Self-pay | Admitting: Neurosurgery

## 2022-04-14 ENCOUNTER — Telehealth: Payer: Self-pay

## 2022-04-14 ENCOUNTER — Ambulatory Visit: Payer: Self-pay

## 2022-04-14 DIAGNOSIS — M4624 Osteomyelitis of vertebra, thoracic region: Secondary | ICD-10-CM | POA: Diagnosis not present

## 2022-04-14 LAB — GLUCOSE, CAPILLARY: Glucose-Capillary: 172 mg/dL — ABNORMAL HIGH (ref 70–99)

## 2022-04-14 MED ORDER — CEFAZOLIN IV (FOR PTA / DISCHARGE USE ONLY): 2.0000 g | Freq: Three times a day (TID) | INTRAVENOUS | 0 refills | Status: AC

## 2022-04-14 MED ORDER — SODIUM CHLORIDE 0.9% FLUSH: 10.0000 mL | Freq: Two times a day (BID) | INTRAVENOUS | Status: DC

## 2022-04-14 MED ORDER — SODIUM CHLORIDE 0.9% FLUSH: 10.0000 mL | INTRAVENOUS | Status: DC | PRN

## 2022-04-14 MED ORDER — PANTOPRAZOLE SODIUM 40 MG PO TBEC
40.0000 mg | DELAYED_RELEASE_TABLET | Freq: Every day | ORAL | Status: DC
  Administered 2022-04-14: 40 mg via ORAL
  Filled 2022-04-14: qty 1

## 2022-04-14 MED ORDER — CEFAZOLIN IV (FOR PTA / DISCHARGE USE ONLY): 2.0000 g | Freq: Three times a day (TID) | INTRAVENOUS | 0 refills | Status: DC

## 2022-04-14 MED ORDER — CYCLOBENZAPRINE HCL 5 MG PO TABS: 5.0000 mg | ORAL_TABLET | Freq: Three times a day (TID) | ORAL | Status: DC | PRN

## 2022-04-14 MED ORDER — OXYCODONE-ACETAMINOPHEN 5-325 MG PO TABS: 1.0000 | ORAL_TABLET | ORAL | Status: DC | PRN

## 2022-04-14 MED ORDER — CYCLOBENZAPRINE HCL 5 MG PO TABS
5.0000 mg | ORAL_TABLET | Freq: Three times a day (TID) | ORAL | 0 refills | Status: DC | PRN
Start: 2022-04-14 — End: 2022-08-04

## 2022-04-14 MED ORDER — OXYCODONE-ACETAMINOPHEN 5-325 MG PO TABS: 1.0000 | ORAL_TABLET | ORAL | 0 refills | Status: DC | PRN

## 2022-04-14 MED ORDER — CHLORHEXIDINE GLUCONATE CLOTH 2 % EX PADS
6.0000 | MEDICATED_PAD | Freq: Every day | CUTANEOUS | Status: DC
  Administered 2022-04-14: 6 via TOPICAL

## 2022-04-14 MED ORDER — HEPARIN SOD (PORK) LOCK FLUSH 100 UNIT/ML IV SOLN
250.0000 [IU] | INTRAVENOUS | Status: AC | PRN
  Administered 2022-04-14: 250 [IU]

## 2022-04-14 MED FILL — Thrombin For Soln 5000 Unit: CUTANEOUS | Qty: 5000 | Status: AC

## 2022-04-14 NOTE — Progress Notes (Signed)
PHARMACY CONSULT NOTE FOR:  OUTPATIENT  PARENTERAL ANTIBIOTIC THERAPY (OPAT)  Indication: MSSA thoracic/cervical spine infection Regimen: Cefazolin 2 gm IV Q 8 hours End date: 05/27/22  IV antibiotic discharge orders are pended. To discharging provider:  please sign these orders via discharge navigator,  Select New Orders & click on the button choice - Manage This Unsigned Work.     Thank you for allowing pharmacy to be a part of this patient's care.  Jimmy Footman, PharmD, BCPS, BCIDP Infectious Diseases Clinical Pharmacist Phone: [phone] 04/14/2022, 9:53 AM

## 2022-04-14 NOTE — TOC Transition Note (Addendum)
Transition of Care [facility]) - CM/SW Discharge Note   Patient Details  Name: [name] MRN: [id number] Date of Birth: 09/25/1946  Transition of Care [facility]) CM/SW Contact:  Pollie Friar, RN Phone Number: 04/14/2022, 10:01 AM   Clinical Narrative:    Patient is from home and was already active with [facility] and [facility] for home IV abx. These will be resumed at d/c. CM has updated Pam with [facility] who needs new orders through ID. HH orders re-entered for [facility].  Pt to have PICC line exchange prior to d/c  as it has been pulled some.  Pt has transport home.    Final next level of care: [place] Barriers to Discharge: No Barriers Identified   Patient Goals and CMS Choice     Choice offered to / list presented to : Patient  Discharge Placement                       Discharge Plan and Services                          HH Arranged: RN [facility] Agency: [place] Date [place]: 04/14/22   Representative spoke with at [place]: [place] ([place]) Interventions     Readmission Risk Interventions     View : No data to display.

## 2022-04-14 NOTE — Discharge Summary (Signed)
Physician Discharge Summary  Patient ID: [name] MRN: [id number] DOB/AGE: 1946-02-17 76 y.o.  Admit date: 04/13/2022 Discharge date: 04/14/2022  Admission Diagnoses: Thoracic osteomyelitis, discitis, epidural abscess, stenosis, thoracic spine pain, thoracic myelopathy  Discharge Diagnoses: The same Principal Problem:   Osteomyelitis of thoracic region [facility])   Discharged Condition: good  Hospital Course: I performed a thoracic laminectomy on the patient on 04/13/2022.  The surgery went well.  The patient's postoperative course was unremarkable.  On postoperative day #1 she requested discharge to home.  Her legs feel stronger.  The patient, and her mother, were given verbal and written discharge instructions all her questions answered.  She is already receiving home IV antibiotics.  Consults: PT, OT, care management Significant Diagnostic Studies: None Treatments: Thoracic laminectomy Discharge Exam: Blood pressure 114/79, pulse 87, temperature 98.5 F (36.9 C), temperature source Oral, resp. rate 16, height '4\' 8"'  (1.422 m), weight 62.1 kg, SpO2 98 %. The patient is alert and pleasant.  Her strength is normal.  Disposition: Home  Discharge Instructions     Call MD for:  difficulty breathing, headache or visual disturbances   Complete by: As directed    Call MD for:  extreme fatigue   Complete by: As directed    Call MD for:  hives   Complete by: As directed    Call MD for:  persistant dizziness or light-headedness   Complete by: As directed    Call MD for:  persistant nausea and vomiting   Complete by: As directed    Call MD for:  redness, tenderness, or signs of infection (pain, swelling, redness, odor or green/yellow discharge around incision site)   Complete by: As directed    Call MD for:  severe uncontrolled pain   Complete by: As directed    Call MD for:  temperature >100.4   Complete by: As directed    Diet - low sodium heart healthy   Complete by: As  directed    Discharge instructions   Complete by: As directed    Call [phone] for a followup appointment. Take a stool softener while you are using pain medications.   Driving Restrictions   Complete by: As directed    Do not drive for 2 weeks.   Increase activity slowly   Complete by: As directed    Lifting restrictions   Complete by: As directed    Do not lift more than 5 pounds. No excessive bending or twisting.   May shower / Bathe   Complete by: As directed    Remove the dressing for 3 days after surgery.  You may shower, but leave the incision alone.   Remove dressing in 48 hours   Complete by: As directed       Allergies as of 04/14/2022       Reactions   Ivp Dye [iodinated Contrast Media] Nausea And Vomiting   Prednisone Other (See Comments)   reflux        Medication List     STOP taking these medications    acetaminophen 500 MG tablet Commonly known as: TYLENOL   diclofenac 75 MG EC tablet Commonly known as: VOLTAREN   methocarbamol 500 MG tablet Commonly known as: ROBAXIN       TAKE these medications    albuterol 108 (90 Base) MCG/ACT inhaler Commonly known as: VENTOLIN HFA Inhale 2 puffs into the lungs every 6 (six) hours as needed for wheezing or shortness of breath.   albuterol 1.25 MG/3ML  nebulizer solution Commonly known as: ACCUNEB Take 1 ampule by nebulization every 4 (four) hours as needed for wheezing.   betamethasone dipropionate 0.05 % cream Apply 1 application. topically daily as needed (EAR).   bumetanide 1 MG tablet Commonly known as: BUMEX Take 1 mg by mouth 2 (two) times daily.   cetirizine 10 MG tablet Commonly known as: ZYRTEC Take 10 mg by mouth daily.   cholecalciferol 25 MCG (1000 UNIT) tablet Commonly known as: VITAMIN D3 Take 1,000 Units by mouth daily.   cyclobenzaprine 5 MG tablet Commonly known as: FLEXERIL Take 1 tablet (5 mg total) by mouth 3 (three) times daily as needed for muscle spasms.    diclofenac Sodium 1 % Gel Commonly known as: VOLTAREN Apply 4 g topically 4 (four) times daily. What changed:  when to take this reasons to take this   diltiazem 180 MG 24 hr capsule Commonly known as: CARDIZEM CD Take 180 mg by mouth daily.   docusate sodium 100 MG capsule Commonly known as: COLACE Take 100 mg by mouth daily as needed for mild constipation.   enalapril 20 MG tablet Commonly known as: VASOTEC Take 20 mg by mouth 2 (two) times daily.   folic acid 248 MCG tablet Commonly known as: FOLVITE Take 800 mcg by mouth daily.   gabapentin 300 MG capsule Commonly known as: NEURONTIN Take 300 mg by mouth at bedtime.   hydrOXYzine 10 MG tablet Commonly known as: ATARAX Take 10-20 mg by mouth 2 (two) times daily as needed for anxiety.   lovastatin 40 MG tablet Commonly known as: MEVACOR Take 40 mg by mouth at bedtime.   meclizine 12.5 MG tablet Commonly known as: ANTIVERT Take 12.5 mg by mouth 2 (two) times daily as needed for dizziness.   metFORMIN 500 MG 24 hr tablet Commonly known as: GLUCOPHAGE-XR Take 500 mg by mouth in the morning and at bedtime.   multivitamin with minerals Tabs tablet Take 1 tablet by mouth daily.   omeprazole 20 MG tablet Commonly known as: PRILOSEC OTC Take 20 mg by mouth daily as needed (heartburn).   ONE TOUCH ULTRA 2 w/Device Kit Check blood sugar twice per week   OneTouch Delica Plus [id number] Misc Use to check blood sugar   glucose blood test strip   OneTouch Ultra test strip Generic drug: glucose blood   oxyCODONE-acetaminophen 5-325 MG tablet Commonly known as: PERCOCET/ROXICET Take 1-2 tablets by mouth every 4 (four) hours as needed for moderate pain.   Potassium Chloride ER 20 MEQ Tbcr 20 mEq daily.   sodium chloride 0.65 % Soln nasal spray Commonly known as: OCEAN Place 1-2 sprays into the nose 4 (four) times daily as needed for congestion.   Trelegy Ellipta 200-62.5-25 MCG/ACT Aepb Generic drug:  Fluticasone-Umeclidin-Vilant Take 1 puff by mouth daily.         Signed: Ophelia Charter 04/14/2022, 7:45 AM

## 2022-04-14 NOTE — Progress Notes (Signed)
Patient awaiting to be transported to her vehicle for discharge home; in no acute distress nor complaints of pain nor discomfort; moves all  extremities well; incision on her back with honeycomb dressing and is clean, dry and intact with TLSO brace on; room was checked for all belongings and were taken along with her on the way to her vehicle; discharge instructions concerning her medications, incision care, follow up appointment and when to call the doctor as needed were all discussed to patient by RN and she verbalized understanding on the instructions given.

## 2022-04-14 NOTE — Progress Notes (Signed)
PT Cancellation Note  Patient Details Name: NAVADA OSTERHOUT MRN: [id number] DOB: [birth year]   Cancelled Treatment:    Reason Eval/Treat Not Completed: PT screened, no needs identified; see Occupational Therapy Evaluation notes for further details. Acute PT will sign off.  Mabeline Caras, PT, DPT Acute Rehabilitation Services  Pager [phone] Office [place] 04/14/2022, 8:28 AM

## 2022-04-14 NOTE — Evaluation (Signed)
Occupational Therapy Evaluation Patient Details Name: [name] MRN: [id number] DOB: Apr 10, 1946 Today's Date: 04/14/2022   History of Present Illness 75 yo female presented with a fracture spine pain and a thoracic epidural abscess. S/p T7-T11 lami on 5/22. PMH including arthritis, HTN, DM type ll, ACDF, back sx, THA, and R shoulder repair.   Clinical Impression   PTA, pt was living with her husband and was independent. Planning on dc to daughter's home for increased support with IV port management. Currently, pt performing at Supervision level for ADLs and functional mobility using RW. Provided education and handout on back precautions, bed mobility, grooming, brace management, UB ADLs, LB ADLs, toileting, and tub transfer with 3n1; pt demonstrated understanding. Answered all pt questions. Recommend dc home once medically stable per physician. All acute OT needs met and will sign off. Thank you.     Recommendations for follow up therapy are one component of a multi-disciplinary discharge planning process, led by the attending physician.  Recommendations may be updated based on patient status, additional functional criteria and insurance authorization.   Follow Up Recommendations  No OT follow up    Assistance Recommended at Discharge PRN  Patient can return home with the following A little help with bathing/dressing/bathroom;Assistance with cooking/housework    Functional Status Assessment  Patient has had a recent decline in their functional status and demonstrates the ability to make significant improvements in function in a reasonable and predictable amount of time.  Equipment Recommendations  None recommended by OT    Recommendations for Other Services       Precautions / Restrictions Precautions Precautions: Back Precaution Booklet Issued: Yes (comment) Required Braces or Orthoses: Spinal Brace Spinal Brace: Thoracolumbosacral orthotic;Applied in sitting  position Restrictions Weight Bearing Restrictions: No      Mobility Bed Mobility Overal bed mobility: Needs Assistance Bed Mobility: Rolling, Sidelying to Sit, Sit to Sidelying Rolling: Supervision Sidelying to sit: Supervision     Sit to sidelying: Supervision General bed mobility comments: Supervision for log roll    Transfers Overall transfer level: Needs assistance Equipment used: None Transfers: Sit to/from Stand Sit to Stand: Supervision                  Balance Overall balance assessment: No apparent balance deficits (not formally assessed)                                         ADL either performed or assessed with clinical judgement   ADL Overall ADL's : Needs assistance/impaired                   Upper Body Dressing Details (indicate cue type and reason): Min A for managing TLSO; otherwise Supervision for UB ADLs                   General ADL Comments: Pt performing ADLs at Supervision with increased time. Providing edcuation on back precautions, bed mobility, grooming, UB ADLs, LB ADLs with AE, TOileting, and tub transfers with shower seat.     Vision Baseline Vision/History: 1 Wears glasses       Perception     Praxis      Pertinent Vitals/Pain Pain Assessment Pain Assessment: Faces Faces Pain Scale: Hurts a little bit Pain Location: Back Pain Descriptors / Indicators: Discomfort Pain Intervention(s): Monitored during session, Limited activity within patient's tolerance, Repositioned  Hand Dominance Right   Extremity/Trunk Assessment Upper Extremity Assessment Upper Extremity Assessment: Overall WFL for tasks assessed   Lower Extremity Assessment Lower Extremity Assessment: Overall WFL for tasks assessed   Cervical / Trunk Assessment Cervical / Trunk Assessment: Back Surgery   Communication Communication Communication: No difficulties   Cognition Arousal/Alertness: Awake/alert Behavior During  Therapy: WFL for tasks assessed/performed Overall Cognitive Status: Within Functional Limits for tasks assessed                                       General Comments  mother present    Exercises     Shoulder Instructions      Home Living Family/patient expects to be discharged to:: Private residence Living Arrangements: Spouse/significant other;Children (Husband has dementia) Available Help at Discharge: Family;Available 24 hours/day (Planning to dc to daughters home) Type of Home: House (condo) Home Access: Level entry     Home Layout: One level     Bathroom Shower/Tub: Teacher, early years/pre: Standard     Home Equipment: Rollator (4 wheels);Rolling Walker (2 wheels);Adaptive equipment;BSC/3in1 (using 3n1 as shower seat) Adaptive Equipment: Reacher;Sock aid;Long-handled shoe horn;Long-handled sponge Additional Comments: Above information for daughters home      Prior Functioning/Environment Prior Level of Function : Independent/Modified Independent;Working/employed;Driving               ADLs Comments: Was performing ADLs. Recently stopped working in [place] as she got sick, but wants to return.        OT Problem List: Decreased activity tolerance;Decreased knowledge of use of DME or AE;Decreased knowledge of precautions      OT Treatment/Interventions:      OT Goals(Current goals can be found in the care plan section) Acute Rehab OT Goals Patient Stated Goal: Go home OT Goal Formulation: All assessment and education complete, DC therapy  OT Frequency:      Co-evaluation              AM-PAC OT "6 Clicks" Daily Activity     Outcome Measure Help from another person eating meals?: A Little Help from another person taking care of personal grooming?: A Little Help from another person toileting, which includes using toliet, bedpan, or urinal?: A Little Help from another person bathing (including washing, rinsing, drying)?: A  Little Help from another person to put on and taking off regular upper body clothing?: A Little Help from another person to put on and taking off regular lower body clothing?: A Little 6 Click Score: 18   End of Session Equipment Utilized During Treatment: Rolling walker (2 wheels);Back brace Nurse Communication: Mobility status  Activity Tolerance: Patient tolerated treatment well Patient left: in bed;with call bell/phone within reach;with family/visitor present  OT Visit Diagnosis: Muscle weakness (generalized) (M62.81);Pain Pain - part of body:  (Back)                Time: 8828-0034 OT Time Calculation (min): 25 min Charges:  OT General Charges $OT Visit: 1 Visit OT Evaluation $OT Eval Low Complexity: 1 Low OT Treatments $Self Care/Home Management : 8-22 mins  Margit Batte MSOT, OTR/L Acute Rehab Pager: [phone] Office: [place] 04/14/2022, 9:03 AM

## 2022-04-14 NOTE — Progress Notes (Signed)
Peripherally Inserted Central Catheter Placement  The IV Nurse has discussed with the patient and/or persons authorized to consent for the patient, the purpose of this procedure and the potential benefits and risks involved with this procedure.  The benefits include less needle sticks, lab draws from the catheter, and the patient may be discharged home with the catheter. Risks include, but not limited to, infection, bleeding, blood clot (thrombus formation), and puncture of an artery; nerve damage and irregular heartbeat and possibility to perform a PICC exchange if needed/ordered by physician.  Alternatives to this procedure were also discussed.  Bard Power PICC patient education guide, fact sheet on infection prevention and patient information card has been provided to patient /or left at bedside.    PICC Placement Documentation  PICC Single Lumen 04/14/22 Right Brachial 33 cm 0 cm (Active)  Indication for Insertion or Continuance of Line Home intravenous therapies (PICC only) 04/14/22 1027  Exposed Catheter (cm) 0 cm 04/14/22 1027  Site Assessment Clean, Dry, Intact 04/14/22 1027  Line Status Flushed;Saline locked;Blood return noted 04/14/22 1027  Dressing Type Securing device;Transparent 04/14/22 1027  Dressing Status Antimicrobial disc in place 04/14/22 1027  Dressing Intervention New dressing;Other (Comment) 04/14/22 1027  Dressing Change Due 04/21/22 04/14/22 1027       [name] 04/14/2022, 10:29 AM

## 2022-04-14 NOTE — Progress Notes (Signed)
Assessed R SL PICC. Site C/D/I with chg dressing, no redness or swelling noted. Noted 4cm out. Primary RN will get hold of provider if PICC still needed for home. RN will consult IV team for PICC exchange if needed.

## 2022-04-14 NOTE — Anesthesia Postprocedure Evaluation (Signed)
Anesthesia Post Note  Patient: [name]  Procedure(s) Performed: Thoracic seven,Thoracic eight, Thoracic nine, Thoracic ten LAMINECTOMY (Spine Thoracic)     Patient location during evaluation: PACU Anesthesia Type: General Level of consciousness: awake and alert Pain management: pain level controlled Vital Signs Assessment: post-procedure vital signs reviewed and stable Respiratory status: spontaneous breathing, nonlabored ventilation, respiratory function stable and patient connected to nasal cannula oxygen Cardiovascular status: blood pressure returned to baseline and stable Postop Assessment: no apparent nausea or vomiting Anesthetic complications: no   No notable events documented.  Last Vitals:  Vitals:   04/14/22 0556 04/14/22 0731  BP: 120/86 114/79  Pulse: 95 87  Resp: 18 16  Temp: 36.6 C 36.9 C  SpO2: 100% 98%    Last Pain:  Vitals:   04/14/22 0731  TempSrc: Oral  PainSc: 3                  Aaronmichael Brumbaugh P Morad Tal

## 2022-04-14 NOTE — Telephone Encounter (Signed)
Called patient to schedule hospital follow up, no answer. Left HIPAA compliant voicemail requesting callback.   Beryle Flock, RN

## 2022-04-14 NOTE — Telephone Encounter (Signed)
Patient returned call, scheduled for 04/21/22 with Dr. Tommy Medal.  Beryle Flock, RN

## 2022-04-14 NOTE — Telephone Encounter (Signed)
-----   Message from Susa Raring, [facility] sent at 04/14/2022 10:00 AM EDT ----- Regarding: Discitis patient Hey team,   This patient has a MSSA spine infection and has been on OPAT. She saw Dr.  Tommy Medal on 5/8 and he recommended restarting the clock so I put in new OPAT to stop 7/5. She had surgery yesterday. I think she will likely need a follow-up appt sometime in the next few weeks though if we can get that scheduled. Thanks!!   Raquel Sarna

## 2022-04-14 NOTE — Plan of Care (Signed)

## 2022-04-17 DIAGNOSIS — Z452 Encounter for adjustment and management of vascular access device: Secondary | ICD-10-CM | POA: Diagnosis not present

## 2022-04-17 DIAGNOSIS — G062 Extradural and subdural abscess, unspecified: Secondary | ICD-10-CM | POA: Diagnosis not present

## 2022-04-17 DIAGNOSIS — M4624 Osteomyelitis of vertebra, thoracic region: Secondary | ICD-10-CM | POA: Diagnosis not present

## 2022-04-17 DIAGNOSIS — E119 Type 2 diabetes mellitus without complications: Secondary | ICD-10-CM | POA: Diagnosis not present

## 2022-04-18 DIAGNOSIS — A419 Sepsis, unspecified organism: Secondary | ICD-10-CM | POA: Diagnosis not present

## 2022-04-18 LAB — AEROBIC/ANAEROBIC CULTURE W GRAM STAIN (SURGICAL/DEEP WOUND)
Culture: NO GROWTH
Gram Stain: NONE SEEN

## 2022-04-21 ENCOUNTER — Other Ambulatory Visit: Payer: Self-pay

## 2022-04-21 ENCOUNTER — Encounter: Payer: Self-pay | Admitting: Infectious Disease

## 2022-04-21 ENCOUNTER — Ambulatory Visit (INDEPENDENT_AMBULATORY_CARE_PROVIDER_SITE_OTHER): Payer: HMO | Admitting: Infectious Disease

## 2022-04-21 ENCOUNTER — Telehealth: Payer: Self-pay

## 2022-04-21 ENCOUNTER — Encounter: Payer: Self-pay | Admitting: Infectious Diseases

## 2022-04-21 VITALS — BP 119/70 | HR 92 | Temp 97.8°F | Ht <= 58 in | Wt 144.0 lb

## 2022-04-21 DIAGNOSIS — G062 Extradural and subdural abscess, unspecified: Secondary | ICD-10-CM

## 2022-04-21 DIAGNOSIS — R7881 Bacteremia: Secondary | ICD-10-CM | POA: Diagnosis not present

## 2022-04-21 DIAGNOSIS — M4624 Osteomyelitis of vertebra, thoracic region: Secondary | ICD-10-CM

## 2022-04-21 DIAGNOSIS — M069 Rheumatoid arthritis, unspecified: Secondary | ICD-10-CM | POA: Diagnosis not present

## 2022-04-21 DIAGNOSIS — Z5181 Encounter for therapeutic drug level monitoring: Secondary | ICD-10-CM | POA: Diagnosis not present

## 2022-04-21 DIAGNOSIS — A4101 Sepsis due to Methicillin susceptible Staphylococcus aureus: Secondary | ICD-10-CM | POA: Diagnosis not present

## 2022-04-21 DIAGNOSIS — B9561 Methicillin susceptible Staphylococcus aureus infection as the cause of diseases classified elsewhere: Secondary | ICD-10-CM

## 2022-04-21 DIAGNOSIS — M4644 Discitis, unspecified, thoracic region: Secondary | ICD-10-CM | POA: Diagnosis not present

## 2022-04-21 DIAGNOSIS — Z792 Long term (current) use of antibiotics: Secondary | ICD-10-CM | POA: Diagnosis not present

## 2022-04-21 MED ORDER — CEFADROXIL 500 MG PO CAPS: 1000.0000 mg | ORAL_CAPSULE | Freq: Two times a day (BID) | ORAL | 11 refills | Status: DC

## 2022-04-21 NOTE — Telephone Encounter (Signed)
Sent order to Carolynn Sayers, RN with [facility] that okay to pull PICC after last dose on 05/27/22 per Dr. Tommy Medal. [facility] pharmacy team updated.   Beryle Flock, RN

## 2022-04-21 NOTE — Progress Notes (Signed)
Subjective:   Chief complaint: Follow-up for severe thoracic spine infection having some itching at her stitch suture sites   Patient ID: [name], female    DOB: February 01, 1946, 76 y.o.   MRN: [id number]  HPI  76 year old black woman with a past medical history significant for rheumatoid arthritis having been on etanercept and methotrexate diabetes mellitus hypertension hyperlipidemia who has had bilateral hip replacements lumbar laminectomy decompression and microdiscectomy in 2019 prior bilateral septic shoulders and MSSA bacteremia treated in 2012 or was it 2004 per patient and daughter with 6 weeks of cefazolin after successful surgery.  She presented to the ER on 25 January 2022   with acute on chronic worsening of her upper thoracic back pain.  Then seen by PCP and noted to be confused came to the ER with worsening fevers and chills.  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  She had initially been on broad-spectrum antibiotics in the form of vancomycin and cefepime and then ceftriaxone.  Blood cultures were without growth and IR guided aspirate of the disc space yielded methicillin sensitive Staphylococcus aureus.   I saw her in the clinic last time there is concern for possible cervical spine diseaseWith her having neck pain as well as trembling and left-sided weakness in her grip in particular  MRI of her thoracic spine was performed on May 4 which showed progressive findings in the thoracic spine from T8-T10 with progression of osteomyelitis with inflammation and spinal cord compression maximal at T8 with continued presence of an epidural phlegmon and dural thickening and enhancement.  She also had more degeneration of her thoracic spine 10 through 11.  MRI of the cervical spine fortunately did not show infectious pathology.  She was taken to the operating room by Dr. Wendee Copp T7, T8, T9, T10 and T11 laminectomy for  drainage of epidural abscess; T7-8, T8-9, T9-10, T10-11 posterior lateral arthrodesis with local morselized autograft bone   Cultures were unrevealing but she had been on antibiotics.  The clock was restarted on her IV cefazolin with end date now planned in early July.  Then surgery she is dramatically better and is no longer has a tremor in either hand.  She is not walking with any dragging of her foot.  Her back pain is nearly nonexistent.     Past Medical History:  Diagnosis Date   Arthritis    "knees, ankles, fingers" (07/06/2018)   Asthma    Chronic lower back pain    Dyspnea    with asthma attacks    Dysrhythmia    Family history of adverse reaction to anesthesia    my first cousin had difficulty waking up    Fibroid    ovarian   GERD (gastroesophageal reflux disease)    Hip osteoarthritis    Left   History of staph infection    in hospital for 11 days/ in 2007   Hyperlipidemia    Hypertension    Radiculopathy    Tremor 03/30/2022   Type II diabetes mellitus ([place])    Upper extremity weakness 03/30/2022   Wears glasses     Past Surgical History:  Procedure Laterality Date   ANTERIOR CERVICAL DECOMP/DISCECTOMY FUSION N/A 12/11/2015   Procedure: ANTERIOR CERVICAL DECOMPRESSION/DISCECTOMY FUSION 2 LEVELS;  Surgeon: Phylliss Bob, MD;  Location: [facility];  Service: Orthopedics;  Laterality: N/A;  Anterior cervical decompression fusion, cervical 6-7, cervical 7-thoracic 1 with instrumentation and allograft   BACK SURGERY  CARDIAC CATHETERIZATION  1998   CATARACT EXTRACTION W/ INTRAOCULAR LENS  IMPLANT, BILATERAL Bilateral 2015   [place]   COLONOSCOPY     CRYOABLATION  1988   "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"   INCISION AND DRAINAGE Bilateral 2007>   "cleaned staph out of shoulders"   IR FLUORO GUIDED NEEDLE PLC ASPIRATION/INJECTION LOC  01/27/2022   IR FLUORO GUIDED NEEDLE PLC ASPIRATION/INJECTION LOC  01/27/2022   JOINT REPLACEMENT     LAPAROSCOPIC  OVARIAN CYSTECTOMY  1970   LUMBAR LAMINECTOMY/DECOMPRESSION MICRODISCECTOMY N/A 01/31/2018   Procedure: LAMINECTOMY AND FORAMINOTOMY LUMBAR TWO- LUMBAR THREE, LUMBAR THREE- LUMBAR FOUR, LUMBAR FOUR- LUMBAR FIVE ;  Surgeon: Newman Pies, MD;  Location: [facility];  Service: Neurosurgery;  Laterality: N/A;   LUMBAR LAMINECTOMY/DECOMPRESSION MICRODISCECTOMY N/A 04/13/2022   Procedure: Thoracic seven,Thoracic eight, Thoracic nine, Thoracic ten LAMINECTOMY;  Surgeon: Newman Pies, MD;  Location: [facility];  Service: Neurosurgery;  Laterality: N/A;   POSTERIOR CERVICAL LAMINECTOMY  ~ [place] Right 02/2003   THUMB FUSION Right 2010   right thumb   TONSILLECTOMY     TOTAL ABDOMINAL HYSTERECTOMY  1998   TAH,BSO   TOTAL HIP ARTHROPLASTY Right 05/18/2018   Procedure: TOTAL HIP ARTHROPLASTY ANTERIOR APPROACH;  Surgeon: Frederik Pear, MD;  Location: [facility];  Service: Orthopedics;  Laterality: Right;   TOTAL HIP ARTHROPLASTY Left 07/06/2018   TOTAL HIP ARTHROPLASTY Left 07/06/2018   Procedure: LEFT TOTAL HIP ARTHROPLASTY ANTERIOR APPROACH;  Surgeon: Frederik Pear, MD;  Location: [facility];  Service: Orthopedics;  Laterality: Left;  Needs RNFA   TUBAL LIGATION      Family History  Problem Relation Age of Onset   Diabetes Sister    Diabetes Brother    Diabetes Brother    Diabetes Paternal Uncle    Cancer Paternal Aunt        UTERINE   Hypertension Maternal Grandmother    Heart disease Maternal Grandmother    Colon cancer Neg Hx       Social History   Socioeconomic History   Marital status: Married    Spouse name: Not on file   Number of children: Not on file   Years of education: Not on file   Highest education level: Not on file  Occupational History   Not on file  Tobacco Use   Smoking status: Former    Packs/day: 1.00    Years: 25.00    Pack years: 25.00    Types: Cigarettes    Quit date: 11/23/1992    Years since quitting: 29.4   Smokeless tobacco: Never   Vaping Use   Vaping Use: Never used  Substance and Sexual Activity   Alcohol use: Not Currently   Drug use: Never   Sexual activity: Not Currently  Other Topics Concern   Not on file  Social History Narrative   Not on file   Social Determinants of Health   Financial Resource Strain: Not on file  Food Insecurity: Not on file  Transportation Needs: Not on file  Physical Activity: Not on file  Stress: Not on file  Social Connections: Not on file    Allergies  Allergen Reactions   Ivp Dye [Iodinated Contrast Media] Nausea And Vomiting   Prednisone Other (See Comments)    reflux     Current Outpatient Medications:    albuterol (ACCUNEB) 1.25 MG/3ML nebulizer solution, Take 1 ampule by nebulization every 4 (four) hours as needed for wheezing.,  Disp: , Rfl:    albuterol (VENTOLIN HFA) 108 (90 Base) MCG/ACT inhaler, Inhale 2 puffs into the lungs every 6 (six) hours as needed for wheezing or shortness of breath., Disp: , Rfl:    betamethasone dipropionate 0.05 % cream, Apply 1 application. topically daily as needed (EAR)., Disp: , Rfl:    Blood Glucose Monitoring Suppl (ONE TOUCH ULTRA 2) w/Device KIT, Check blood sugar twice per week, Disp: , Rfl:    bumetanide (BUMEX) 1 MG tablet, Take 1 mg by mouth 2 (two) times daily., Disp: , Rfl:    ceFAZolin (ANCEF) IVPB, Inject 2 g into the vein every 8 (eight) hours. Indication:  MSSA infection of thoracic/cervical spine First Dose: Yes Last Day of Therapy:  05/27/22 Labs - Once weekly:  CBC/D and BMP, Labs - Every other week:  ESR and CRP Method of administration: IV Push Method of administration may be changed at the discretion of home infusion pharmacist based upon assessment of the patient and/or caregiver's ability to self-administer the medication ordered., Disp: 129 Units, Rfl: 0   cetirizine (ZYRTEC) 10 MG tablet, Take 10 mg by mouth daily., Disp: , Rfl:    cholecalciferol (VITAMIN D3) 25 MCG (1000 UNIT) tablet, Take 1,000 Units by mouth  daily., Disp: , Rfl:    cyclobenzaprine (FLEXERIL) 5 MG tablet, Take 1 tablet (5 mg total) by mouth 3 (three) times daily as needed for muscle spasms., Disp: 30 tablet, Rfl: 0   diclofenac Sodium (VOLTAREN) 1 % GEL, Apply 4 g topically 4 (four) times daily. (Patient taking differently: Apply 4 g topically daily as needed (Knee pain).), Disp: 100 g, Rfl: 0   diltiazem (CARDIZEM CD) 180 MG 24 hr capsule, Take 180 mg by mouth daily., Disp: , Rfl:    docusate sodium (COLACE) 100 MG capsule, Take 100 mg by mouth daily as needed for mild constipation., Disp: , Rfl:    enalapril (VASOTEC) 20 MG tablet, Take 20 mg by mouth 2 (two) times daily., Disp: , Rfl:    folic acid (FOLVITE) 588 MCG tablet, Take 800 mcg by mouth daily., Disp: , Rfl:    gabapentin (NEURONTIN) 300 MG capsule, Take 300 mg by mouth at bedtime., Disp: , Rfl:    glucose blood test strip, , Disp: , Rfl:    hydrOXYzine (ATARAX) 10 MG tablet, Take 10-20 mg by mouth 2 (two) times daily as needed for anxiety., Disp: , Rfl:    Lancets (ONETOUCH DELICA PLUS FOYDXA12I) MISC, Use to check blood sugar, Disp: , Rfl:    lovastatin (MEVACOR) 40 MG tablet, Take 40 mg by mouth at bedtime. , Disp: , Rfl:    meclizine (ANTIVERT) 12.5 MG tablet, Take 12.5 mg by mouth 2 (two) times daily as needed for dizziness., Disp: , Rfl:    metFORMIN (GLUCOPHAGE-XR) 500 MG 24 hr tablet, Take 500 mg by mouth in the morning and at bedtime., Disp: , Rfl:    Multiple Vitamin (MULTIVITAMIN WITH MINERALS) TABS tablet, Take 1 tablet by mouth daily., Disp: , Rfl:    omeprazole (PRILOSEC OTC) 20 MG tablet, Take 20 mg by mouth daily as needed (heartburn)., Disp: , Rfl:    ONETOUCH ULTRA test strip, , Disp: , Rfl:    oxyCODONE-acetaminophen (PERCOCET/ROXICET) 5-325 MG tablet, Take 1-2 tablets by mouth every 4 (four) hours as needed for moderate pain., Disp: 30 tablet, Rfl: 0   Potassium Chloride ER 20 MEQ TBCR, 20 mEq daily., Disp: , Rfl:    sodium chloride (OCEAN) 0.65 % SOLN  nasal spray, Place 1-2 sprays into the nose 4 (four) times daily as needed for congestion., Disp: , Rfl:    TRELEGY ELLIPTA 200-62.5-25 MCG/ACT AEPB, Take 1 puff by mouth daily., Disp: , Rfl:   Review of Systems  Constitutional:  Negative for activity change, appetite change, chills, diaphoresis, fatigue, fever and unexpected weight change.  HENT:  Negative for congestion, rhinorrhea, sinus pressure, sneezing, sore throat and trouble swallowing.   Eyes:  Negative for photophobia and visual disturbance.  Respiratory:  Negative for cough, chest tightness, shortness of breath, wheezing and stridor.   Cardiovascular:  Negative for chest pain, palpitations and leg swelling.  Gastrointestinal:  Negative for abdominal distention, abdominal pain, anal bleeding, blood in stool, constipation, diarrhea, nausea and vomiting.  Genitourinary:  Negative for difficulty urinating, dysuria, flank pain and hematuria.  Musculoskeletal:  Negative for arthralgias, back pain, gait problem, joint swelling and myalgias.  Skin:  Negative for color change, pallor, rash and wound.  Neurological:  Negative for dizziness, tremors, weakness and light-headedness.  Hematological:  Negative for adenopathy. Does not bruise/bleed easily.  Psychiatric/Behavioral:  Negative for agitation, behavioral problems, confusion, decreased concentration, dysphoric mood and sleep disturbance.       Objective:   Physical Exam Constitutional:      General: She is not in acute distress.    Appearance: She is well-developed. She is not diaphoretic.  HENT:     Head: Normocephalic and atraumatic.     Mouth/Throat:     Pharynx: No oropharyngeal exudate.  Eyes:     General: No scleral icterus.    Conjunctiva/sclera: Conjunctivae normal.  Cardiovascular:     Rate and Rhythm: Normal rate and regular rhythm.  Pulmonary:     Effort: Pulmonary effort is normal. No respiratory distress.     Breath sounds: No wheezing.  Abdominal:     General:  There is no distension.  Musculoskeletal:        General: No tenderness.     Cervical back: Normal range of motion and neck supple.  Skin:    General: Skin is warm and dry.     Coloration: Skin is not pale.     Findings: No erythema or rash.  Neurological:     General: No focal deficit present.     Mental Status: She is alert and oriented to person, place, and time.     Motor: No abnormal muscle tone.     Coordination: Coordination normal.  Psychiatric:        Mood and Affect: Mood normal.        Behavior: Behavior normal.        Thought Content: Thought content normal.        Judgment: Judgment normal.     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  I will plan on her starting cefadroxil 2 tablets twice a day afterwards and following up with me in August.  She does not have hardware involved but I have concerns given the extent of the infection in her spine would prefer protracted antibiotics in the setting.  RA: Should avoid potent Biologics going forward  I spent 34 minutes with the patient including than 50% of the time in face to face counseling of the patient guarding her spinal infection prior bacteremia personally reviewing  thoracic spine along with review of medical records in preparation for the visit and during the visit and in coordination of her care.

## 2022-04-24 DIAGNOSIS — M4726 Other spondylosis with radiculopathy, lumbar region: Secondary | ICD-10-CM | POA: Diagnosis not present

## 2022-04-24 DIAGNOSIS — M5134 Other intervertebral disc degeneration, thoracic region: Secondary | ICD-10-CM | POA: Diagnosis not present

## 2022-04-24 DIAGNOSIS — Z792 Long term (current) use of antibiotics: Secondary | ICD-10-CM | POA: Diagnosis not present

## 2022-04-24 DIAGNOSIS — I119 Hypertensive heart disease without heart failure: Secondary | ICD-10-CM | POA: Diagnosis not present

## 2022-04-24 DIAGNOSIS — G062 Extradural and subdural abscess, unspecified: Secondary | ICD-10-CM | POA: Diagnosis not present

## 2022-04-24 DIAGNOSIS — Z7951 Long term (current) use of inhaled steroids: Secondary | ICD-10-CM | POA: Diagnosis not present

## 2022-04-24 DIAGNOSIS — M5136 Other intervertebral disc degeneration, lumbar region: Secondary | ICD-10-CM | POA: Diagnosis not present

## 2022-04-24 DIAGNOSIS — Z452 Encounter for adjustment and management of vascular access device: Secondary | ICD-10-CM | POA: Diagnosis not present

## 2022-04-24 DIAGNOSIS — Z7984 Long term (current) use of oral hypoglycemic drugs: Secondary | ICD-10-CM | POA: Diagnosis not present

## 2022-04-24 DIAGNOSIS — E119 Type 2 diabetes mellitus without complications: Secondary | ICD-10-CM | POA: Diagnosis not present

## 2022-04-24 DIAGNOSIS — M47814 Spondylosis without myelopathy or radiculopathy, thoracic region: Secondary | ICD-10-CM | POA: Diagnosis not present

## 2022-04-24 DIAGNOSIS — M4805 Spinal stenosis, thoracolumbar region: Secondary | ICD-10-CM | POA: Diagnosis not present

## 2022-04-24 DIAGNOSIS — M4624 Osteomyelitis of vertebra, thoracic region: Secondary | ICD-10-CM | POA: Diagnosis not present

## 2022-04-24 DIAGNOSIS — M4804 Spinal stenosis, thoracic region: Secondary | ICD-10-CM | POA: Diagnosis not present

## 2022-04-24 DIAGNOSIS — E785 Hyperlipidemia, unspecified: Secondary | ICD-10-CM | POA: Diagnosis not present

## 2022-04-24 DIAGNOSIS — J432 Centrilobular emphysema: Secondary | ICD-10-CM | POA: Diagnosis not present

## 2022-04-24 DIAGNOSIS — M4316 Spondylolisthesis, lumbar region: Secondary | ICD-10-CM | POA: Diagnosis not present

## 2022-04-24 DIAGNOSIS — M069 Rheumatoid arthritis, unspecified: Secondary | ICD-10-CM | POA: Diagnosis not present

## 2022-04-24 DIAGNOSIS — M19012 Primary osteoarthritis, left shoulder: Secondary | ICD-10-CM | POA: Diagnosis not present

## 2022-04-24 DIAGNOSIS — Z79899 Other long term (current) drug therapy: Secondary | ICD-10-CM | POA: Diagnosis not present

## 2022-04-24 DIAGNOSIS — I081 Rheumatic disorders of both mitral and tricuspid valves: Secondary | ICD-10-CM | POA: Diagnosis not present

## 2022-04-24 DIAGNOSIS — M48061 Spinal stenosis, lumbar region without neurogenic claudication: Secondary | ICD-10-CM | POA: Diagnosis not present

## 2022-04-24 DIAGNOSIS — I700 Atherosclerosis of aorta: Secondary | ICD-10-CM | POA: Diagnosis not present

## 2022-04-24 DIAGNOSIS — Z96643 Presence of artificial hip joint, bilateral: Secondary | ICD-10-CM | POA: Diagnosis not present

## 2022-04-25 DIAGNOSIS — A419 Sepsis, unspecified organism: Secondary | ICD-10-CM | POA: Diagnosis not present

## 2022-04-27 DIAGNOSIS — Z792 Long term (current) use of antibiotics: Secondary | ICD-10-CM | POA: Diagnosis not present

## 2022-04-27 DIAGNOSIS — Z5181 Encounter for therapeutic drug level monitoring: Secondary | ICD-10-CM | POA: Diagnosis not present

## 2022-04-27 DIAGNOSIS — G062 Extradural and subdural abscess, unspecified: Secondary | ICD-10-CM | POA: Diagnosis not present

## 2022-04-27 DIAGNOSIS — M4624 Osteomyelitis of vertebra, thoracic region: Secondary | ICD-10-CM | POA: Diagnosis not present

## 2022-04-27 DIAGNOSIS — A4101 Sepsis due to Methicillin susceptible Staphylococcus aureus: Secondary | ICD-10-CM | POA: Diagnosis not present

## 2022-04-30 DIAGNOSIS — G062 Extradural and subdural abscess, unspecified: Secondary | ICD-10-CM | POA: Diagnosis not present

## 2022-04-30 DIAGNOSIS — E119 Type 2 diabetes mellitus without complications: Secondary | ICD-10-CM | POA: Diagnosis not present

## 2022-04-30 DIAGNOSIS — M4624 Osteomyelitis of vertebra, thoracic region: Secondary | ICD-10-CM | POA: Diagnosis not present

## 2022-04-30 DIAGNOSIS — Z452 Encounter for adjustment and management of vascular access device: Secondary | ICD-10-CM | POA: Diagnosis not present

## 2022-05-02 DIAGNOSIS — A419 Sepsis, unspecified organism: Secondary | ICD-10-CM | POA: Diagnosis not present

## 2022-05-04 DIAGNOSIS — Z5181 Encounter for therapeutic drug level monitoring: Secondary | ICD-10-CM | POA: Diagnosis not present

## 2022-05-04 DIAGNOSIS — M4624 Osteomyelitis of vertebra, thoracic region: Secondary | ICD-10-CM | POA: Diagnosis not present

## 2022-05-04 DIAGNOSIS — A4101 Sepsis due to Methicillin susceptible Staphylococcus aureus: Secondary | ICD-10-CM | POA: Diagnosis not present

## 2022-05-04 DIAGNOSIS — G062 Extradural and subdural abscess, unspecified: Secondary | ICD-10-CM | POA: Diagnosis not present

## 2022-05-04 DIAGNOSIS — Z792 Long term (current) use of antibiotics: Secondary | ICD-10-CM | POA: Diagnosis not present

## 2022-05-09 DIAGNOSIS — A419 Sepsis, unspecified organism: Secondary | ICD-10-CM | POA: Diagnosis not present

## 2022-05-11 DIAGNOSIS — E119 Type 2 diabetes mellitus without complications: Secondary | ICD-10-CM | POA: Diagnosis not present

## 2022-05-11 DIAGNOSIS — M47814 Spondylosis without myelopathy or radiculopathy, thoracic region: Secondary | ICD-10-CM | POA: Diagnosis not present

## 2022-05-11 DIAGNOSIS — A4101 Sepsis due to Methicillin susceptible Staphylococcus aureus: Secondary | ICD-10-CM | POA: Diagnosis not present

## 2022-05-11 DIAGNOSIS — M4316 Spondylolisthesis, lumbar region: Secondary | ICD-10-CM | POA: Diagnosis not present

## 2022-05-11 DIAGNOSIS — J432 Centrilobular emphysema: Secondary | ICD-10-CM | POA: Diagnosis not present

## 2022-05-11 DIAGNOSIS — M48061 Spinal stenosis, lumbar region without neurogenic claudication: Secondary | ICD-10-CM | POA: Diagnosis not present

## 2022-05-11 DIAGNOSIS — Z7951 Long term (current) use of inhaled steroids: Secondary | ICD-10-CM | POA: Diagnosis not present

## 2022-05-11 DIAGNOSIS — Z96643 Presence of artificial hip joint, bilateral: Secondary | ICD-10-CM | POA: Diagnosis not present

## 2022-05-11 DIAGNOSIS — Z79899 Other long term (current) drug therapy: Secondary | ICD-10-CM | POA: Diagnosis not present

## 2022-05-11 DIAGNOSIS — I700 Atherosclerosis of aorta: Secondary | ICD-10-CM | POA: Diagnosis not present

## 2022-05-11 DIAGNOSIS — M5134 Other intervertebral disc degeneration, thoracic region: Secondary | ICD-10-CM | POA: Diagnosis not present

## 2022-05-11 DIAGNOSIS — Z792 Long term (current) use of antibiotics: Secondary | ICD-10-CM | POA: Diagnosis not present

## 2022-05-11 DIAGNOSIS — Z5181 Encounter for therapeutic drug level monitoring: Secondary | ICD-10-CM | POA: Diagnosis not present

## 2022-05-11 DIAGNOSIS — M4804 Spinal stenosis, thoracic region: Secondary | ICD-10-CM | POA: Diagnosis not present

## 2022-05-11 DIAGNOSIS — Z7984 Long term (current) use of oral hypoglycemic drugs: Secondary | ICD-10-CM | POA: Diagnosis not present

## 2022-05-11 DIAGNOSIS — Z452 Encounter for adjustment and management of vascular access device: Secondary | ICD-10-CM | POA: Diagnosis not present

## 2022-05-11 DIAGNOSIS — M4726 Other spondylosis with radiculopathy, lumbar region: Secondary | ICD-10-CM | POA: Diagnosis not present

## 2022-05-11 DIAGNOSIS — M069 Rheumatoid arthritis, unspecified: Secondary | ICD-10-CM | POA: Diagnosis not present

## 2022-05-11 DIAGNOSIS — G062 Extradural and subdural abscess, unspecified: Secondary | ICD-10-CM | POA: Diagnosis not present

## 2022-05-11 DIAGNOSIS — M4805 Spinal stenosis, thoracolumbar region: Secondary | ICD-10-CM | POA: Diagnosis not present

## 2022-05-11 DIAGNOSIS — M19012 Primary osteoarthritis, left shoulder: Secondary | ICD-10-CM | POA: Diagnosis not present

## 2022-05-11 DIAGNOSIS — M5136 Other intervertebral disc degeneration, lumbar region: Secondary | ICD-10-CM | POA: Diagnosis not present

## 2022-05-11 DIAGNOSIS — M4624 Osteomyelitis of vertebra, thoracic region: Secondary | ICD-10-CM | POA: Diagnosis not present

## 2022-05-11 DIAGNOSIS — I081 Rheumatic disorders of both mitral and tricuspid valves: Secondary | ICD-10-CM | POA: Diagnosis not present

## 2022-05-11 DIAGNOSIS — I119 Hypertensive heart disease without heart failure: Secondary | ICD-10-CM | POA: Diagnosis not present

## 2022-05-11 DIAGNOSIS — E785 Hyperlipidemia, unspecified: Secondary | ICD-10-CM | POA: Diagnosis not present

## 2022-05-12 DIAGNOSIS — M4624 Osteomyelitis of vertebra, thoracic region: Secondary | ICD-10-CM | POA: Diagnosis not present

## 2022-05-16 DIAGNOSIS — A419 Sepsis, unspecified organism: Secondary | ICD-10-CM | POA: Diagnosis not present

## 2022-05-18 DIAGNOSIS — M4624 Osteomyelitis of vertebra, thoracic region: Secondary | ICD-10-CM | POA: Diagnosis not present

## 2022-05-23 DIAGNOSIS — A419 Sepsis, unspecified organism: Secondary | ICD-10-CM | POA: Diagnosis not present

## 2022-05-25 DIAGNOSIS — Z79899 Other long term (current) drug therapy: Secondary | ICD-10-CM | POA: Diagnosis not present

## 2022-05-25 DIAGNOSIS — M4805 Spinal stenosis, thoracolumbar region: Secondary | ICD-10-CM | POA: Diagnosis not present

## 2022-05-25 DIAGNOSIS — G062 Extradural and subdural abscess, unspecified: Secondary | ICD-10-CM | POA: Diagnosis not present

## 2022-05-25 DIAGNOSIS — J432 Centrilobular emphysema: Secondary | ICD-10-CM | POA: Diagnosis not present

## 2022-05-25 DIAGNOSIS — Z452 Encounter for adjustment and management of vascular access device: Secondary | ICD-10-CM | POA: Diagnosis not present

## 2022-05-25 DIAGNOSIS — M4804 Spinal stenosis, thoracic region: Secondary | ICD-10-CM | POA: Diagnosis not present

## 2022-05-25 DIAGNOSIS — M5136 Other intervertebral disc degeneration, lumbar region: Secondary | ICD-10-CM | POA: Diagnosis not present

## 2022-05-25 DIAGNOSIS — M4726 Other spondylosis with radiculopathy, lumbar region: Secondary | ICD-10-CM | POA: Diagnosis not present

## 2022-05-25 DIAGNOSIS — Z96643 Presence of artificial hip joint, bilateral: Secondary | ICD-10-CM | POA: Diagnosis not present

## 2022-05-25 DIAGNOSIS — M48061 Spinal stenosis, lumbar region without neurogenic claudication: Secondary | ICD-10-CM | POA: Diagnosis not present

## 2022-05-25 DIAGNOSIS — M4624 Osteomyelitis of vertebra, thoracic region: Secondary | ICD-10-CM | POA: Diagnosis not present

## 2022-05-25 DIAGNOSIS — I081 Rheumatic disorders of both mitral and tricuspid valves: Secondary | ICD-10-CM | POA: Diagnosis not present

## 2022-05-25 DIAGNOSIS — A4101 Sepsis due to Methicillin susceptible Staphylococcus aureus: Secondary | ICD-10-CM | POA: Diagnosis not present

## 2022-05-25 DIAGNOSIS — Z5181 Encounter for therapeutic drug level monitoring: Secondary | ICD-10-CM | POA: Diagnosis not present

## 2022-05-25 DIAGNOSIS — I119 Hypertensive heart disease without heart failure: Secondary | ICD-10-CM | POA: Diagnosis not present

## 2022-05-25 DIAGNOSIS — Z7951 Long term (current) use of inhaled steroids: Secondary | ICD-10-CM | POA: Diagnosis not present

## 2022-05-25 DIAGNOSIS — M5134 Other intervertebral disc degeneration, thoracic region: Secondary | ICD-10-CM | POA: Diagnosis not present

## 2022-05-25 DIAGNOSIS — Z792 Long term (current) use of antibiotics: Secondary | ICD-10-CM | POA: Diagnosis not present

## 2022-05-25 DIAGNOSIS — I700 Atherosclerosis of aorta: Secondary | ICD-10-CM | POA: Diagnosis not present

## 2022-05-25 DIAGNOSIS — Z7984 Long term (current) use of oral hypoglycemic drugs: Secondary | ICD-10-CM | POA: Diagnosis not present

## 2022-05-25 DIAGNOSIS — M4316 Spondylolisthesis, lumbar region: Secondary | ICD-10-CM | POA: Diagnosis not present

## 2022-05-25 DIAGNOSIS — E785 Hyperlipidemia, unspecified: Secondary | ICD-10-CM | POA: Diagnosis not present

## 2022-05-25 DIAGNOSIS — M47814 Spondylosis without myelopathy or radiculopathy, thoracic region: Secondary | ICD-10-CM | POA: Diagnosis not present

## 2022-05-25 DIAGNOSIS — E119 Type 2 diabetes mellitus without complications: Secondary | ICD-10-CM | POA: Diagnosis not present

## 2022-05-25 DIAGNOSIS — M19012 Primary osteoarthritis, left shoulder: Secondary | ICD-10-CM | POA: Diagnosis not present

## 2022-05-25 DIAGNOSIS — M069 Rheumatoid arthritis, unspecified: Secondary | ICD-10-CM | POA: Diagnosis not present

## 2022-05-28 DIAGNOSIS — M25561 Pain in right knee: Secondary | ICD-10-CM | POA: Diagnosis not present

## 2022-05-28 DIAGNOSIS — M25551 Pain in right hip: Secondary | ICD-10-CM | POA: Diagnosis not present

## 2022-05-28 DIAGNOSIS — Z96641 Presence of right artificial hip joint: Secondary | ICD-10-CM | POA: Diagnosis not present

## 2022-06-01 ENCOUNTER — Telehealth: Payer: Self-pay

## 2022-06-01 DIAGNOSIS — J45909 Unspecified asthma, uncomplicated: Secondary | ICD-10-CM | POA: Diagnosis not present

## 2022-06-01 DIAGNOSIS — I10 Essential (primary) hypertension: Secondary | ICD-10-CM | POA: Diagnosis not present

## 2022-06-01 DIAGNOSIS — M10072 Idiopathic gout, left ankle and foot: Secondary | ICD-10-CM | POA: Diagnosis not present

## 2022-06-01 DIAGNOSIS — Z87891 Personal history of nicotine dependence: Secondary | ICD-10-CM | POA: Diagnosis not present

## 2022-06-01 DIAGNOSIS — Z7984 Long term (current) use of oral hypoglycemic drugs: Secondary | ICD-10-CM | POA: Diagnosis not present

## 2022-06-01 DIAGNOSIS — M0600 Rheumatoid arthritis without rheumatoid factor, unspecified site: Secondary | ICD-10-CM | POA: Diagnosis not present

## 2022-06-01 DIAGNOSIS — M545 Low back pain, unspecified: Secondary | ICD-10-CM | POA: Diagnosis not present

## 2022-06-01 DIAGNOSIS — E119 Type 2 diabetes mellitus without complications: Secondary | ICD-10-CM | POA: Diagnosis not present

## 2022-06-01 DIAGNOSIS — G629 Polyneuropathy, unspecified: Secondary | ICD-10-CM | POA: Diagnosis not present

## 2022-06-01 NOTE — Telephone Encounter (Signed)
Patient is calling stating Dr. Mayer Camel with [facility] is stating patient will need a second hip replacement on her right hip. Patient stating Dr. Mayer Camel is wanting clearance from Dr. Tommy Medal before proceeding with the surgery.  Please advise

## 2022-06-02 ENCOUNTER — Encounter: Payer: Self-pay | Admitting: Podiatry

## 2022-06-02 ENCOUNTER — Ambulatory Visit: Payer: HMO | Admitting: Podiatry

## 2022-06-02 DIAGNOSIS — Q828 Other specified congenital malformations of skin: Secondary | ICD-10-CM | POA: Diagnosis not present

## 2022-06-02 DIAGNOSIS — M79675 Pain in left toe(s): Secondary | ICD-10-CM

## 2022-06-02 DIAGNOSIS — L84 Corns and callosities: Secondary | ICD-10-CM

## 2022-06-02 DIAGNOSIS — E1121 Type 2 diabetes mellitus with diabetic nephropathy: Secondary | ICD-10-CM | POA: Insufficient documentation

## 2022-06-02 DIAGNOSIS — F329 Major depressive disorder, single episode, unspecified: Secondary | ICD-10-CM | POA: Insufficient documentation

## 2022-06-02 DIAGNOSIS — M79674 Pain in right toe(s): Secondary | ICD-10-CM | POA: Diagnosis not present

## 2022-06-02 DIAGNOSIS — B351 Tinea unguium: Secondary | ICD-10-CM | POA: Diagnosis not present

## 2022-06-02 DIAGNOSIS — G629 Polyneuropathy, unspecified: Secondary | ICD-10-CM | POA: Insufficient documentation

## 2022-06-02 DIAGNOSIS — M21969 Unspecified acquired deformity of unspecified lower leg: Secondary | ICD-10-CM | POA: Insufficient documentation

## 2022-06-02 DIAGNOSIS — E1142 Type 2 diabetes mellitus with diabetic polyneuropathy: Secondary | ICD-10-CM | POA: Insufficient documentation

## 2022-06-02 DIAGNOSIS — M8580 Other specified disorders of bone density and structure, unspecified site: Secondary | ICD-10-CM | POA: Insufficient documentation

## 2022-06-02 DIAGNOSIS — E119 Type 2 diabetes mellitus without complications: Secondary | ICD-10-CM | POA: Diagnosis not present

## 2022-06-02 DIAGNOSIS — I700 Atherosclerosis of aorta: Secondary | ICD-10-CM | POA: Insufficient documentation

## 2022-06-02 DIAGNOSIS — K219 Gastro-esophageal reflux disease without esophagitis: Secondary | ICD-10-CM | POA: Insufficient documentation

## 2022-06-02 DIAGNOSIS — Z720 Tobacco use: Secondary | ICD-10-CM | POA: Insufficient documentation

## 2022-06-02 DIAGNOSIS — D509 Iron deficiency anemia, unspecified: Secondary | ICD-10-CM | POA: Insufficient documentation

## 2022-06-02 DIAGNOSIS — E669 Obesity, unspecified: Secondary | ICD-10-CM | POA: Insufficient documentation

## 2022-06-02 DIAGNOSIS — M1990 Unspecified osteoarthritis, unspecified site: Secondary | ICD-10-CM | POA: Insufficient documentation

## 2022-06-02 NOTE — Telephone Encounter (Signed)
Patient ok waiting until Dr. Tommy Medal return. If she feels she needs to be seen sooner for a clearance she will call.  [name] T Brooks Sailors

## 2022-06-07 NOTE — Progress Notes (Signed)
  Subjective:  Patient ID: [name], female    DOB: 1946/08/07,  MRN: [id number]  [name] presents to clinic today for preventative diabetic foot care and callus(es) left lower extremity, porokeratotic lesion(s) left lower extremity, and painful mycotic nails. Painful toenails interfere with ambulation. Aggravating factors include wearing enclosed shoe gear. Pain is relieved with periodic professional debridement. Painful callus(es) and porokeratotic lesion(s) are aggravated when weightbearing with and without shoegear. Pain is relieved with periodic professional debridement.  Patient states blood glucose was 150 mg/dl today.  Last A1c was 5.9%.  New problem(s): None.   PCP is Merrilee Seashore, MD , and last visit was  June 01, 2022  Allergies  Allergen Reactions   Ivp Dye [Iodinated Contrast Media] Nausea And Vomiting   Prednisone Other (See Comments)    reflux    Review of Systems: Negative except as noted in the HPI.  Objective:   [name] is a pleasant 76 y.o. female in NAD. AAO X 3.  Vascular Examination: CFT immediate b/l LE. Palpable DP/PT pulses b/l LE. Digital hair sparse b/l. Skin temperature gradient WNL b/l. No pain with calf compression b/l. No edema noted b/l. No cyanosis or clubbing noted b/l LE.  Dermatological Examination: Pedal skin is warm and supple b/l LE. No open wounds b/l LE. No interdigital macerations noted b/l LE. Anonychia noted right great toe. Nailbed(s) epithelialized.  Toenails left hallux and 2-5 bilaterally severely elongated, thickened with discoloration, also possessing excessive curvature and impinging onto the distal tips of the digits. No erythema, no edema, no drainage, no fluctuance. Hyperkeratotic lesion(s) L hallux. No erythema, no edema, no drainage, no fluctuance. Porokeratotic lesion(s) L 5th toe. No erythema, no edema, no drainage, no fluctuance.  Musculoskeletal Examination: Muscle strength 5/5 to all lower extremity  muscle groups bilaterally. No pain, crepitus or joint limitation noted with ROM bilateral LE. No gross bony deformities bilaterally.  Neurological Examination: Protective sensation intact 5/5 intact bilaterally with 10g monofilament b/l. Vibratory sensation intact b/l.     Latest Ref Rng & Units 04/09/2022   10:09 AM 01/27/2022    6:10 PM  Hemoglobin A1C  Hemoglobin-A1c 4.8 - 5.6 % 5.5  5.4    Assessment/Plan: 1. Pain due to onychomycosis of toenails of both feet   2. Callus   3. Porokeratosis   4. Controlled type 2 diabetes mellitus without complication, without long-term current use of insulin ([place])     -Examined patient. -Patient to continue soft, supportive shoe gear daily. -Toenails 2-5 bilaterally and L hallux debrided in length and girth without iatrogenic bleeding with sterile nail nipper and dremel.  -Callus(es) medial IPJ of left great toe pared utilizing sterile scalpel blade without complication or incident. Total number debrided =1. -Porokeratotic lesion(s) PIPJ of L 5th toe pared and enucleated with sterile scalpel blade without incident. Total number of lesions debrided=1. -Patient/POA to call should there be question/concern in the interim.   Return in about 3 months (around 09/02/2022).  Marzetta Board, DPM

## 2022-06-11 ENCOUNTER — Telehealth: Payer: Self-pay

## 2022-06-11 NOTE — Telephone Encounter (Signed)
Received call today from patient requesting clearance for hip replacement surgery. States Dr. Mayer Camel from [facility] is requesting clearance before she can be scheduled. Requested office have clearance form faxed office. Will forward message to Dr. Tommy Medal to advise on this. Leatrice Jewels, RMA

## 2022-06-16 NOTE — Telephone Encounter (Signed)
Received call from [facility] asking about surgical clearance. Advised her that Dr. Tommy Medal is not opposed to surgery, but that she will need to have her surgeon's office fax a surgical clearance form. Provided her with triage fax number.   Beryle Flock, RN

## 2022-06-17 DIAGNOSIS — Z87891 Personal history of nicotine dependence: Secondary | ICD-10-CM | POA: Diagnosis not present

## 2022-06-17 DIAGNOSIS — J449 Chronic obstructive pulmonary disease, unspecified: Secondary | ICD-10-CM | POA: Diagnosis not present

## 2022-06-17 DIAGNOSIS — Z7951 Long term (current) use of inhaled steroids: Secondary | ICD-10-CM | POA: Diagnosis not present

## 2022-06-19 NOTE — Telephone Encounter (Signed)
Received surgical clearance form via fax. Placed in provider's box for completion.   Beryle Flock, RN

## 2022-06-22 NOTE — Telephone Encounter (Signed)
Form completed and signed by provider. Faxed to Marlin Canary, scheduler.   Beryle Flock, RN

## 2022-06-23 DIAGNOSIS — Z01818 Encounter for other preprocedural examination: Secondary | ICD-10-CM | POA: Diagnosis not present

## 2022-06-23 DIAGNOSIS — E1142 Type 2 diabetes mellitus with diabetic polyneuropathy: Secondary | ICD-10-CM | POA: Diagnosis not present

## 2022-06-29 ENCOUNTER — Ambulatory Visit (INDEPENDENT_AMBULATORY_CARE_PROVIDER_SITE_OTHER): Payer: HMO | Admitting: Infectious Disease

## 2022-06-29 ENCOUNTER — Other Ambulatory Visit: Payer: Self-pay

## 2022-06-29 VITALS — Wt 144.0 lb

## 2022-06-29 DIAGNOSIS — A4101 Sepsis due to Methicillin susceptible Staphylococcus aureus: Secondary | ICD-10-CM

## 2022-06-29 DIAGNOSIS — T84010A Broken internal right hip prosthesis, initial encounter: Secondary | ICD-10-CM | POA: Diagnosis not present

## 2022-06-29 DIAGNOSIS — M4624 Osteomyelitis of vertebra, thoracic region: Secondary | ICD-10-CM | POA: Diagnosis not present

## 2022-06-29 DIAGNOSIS — T84018A Broken internal joint prosthesis, other site, initial encounter: Secondary | ICD-10-CM

## 2022-06-29 DIAGNOSIS — M4620 Osteomyelitis of vertebra, site unspecified: Secondary | ICD-10-CM

## 2022-06-29 DIAGNOSIS — M4644 Discitis, unspecified, thoracic region: Secondary | ICD-10-CM | POA: Diagnosis not present

## 2022-06-29 DIAGNOSIS — M069 Rheumatoid arthritis, unspecified: Secondary | ICD-10-CM

## 2022-06-29 MED ORDER — CEFADROXIL 500 MG PO CAPS: 500.0000 mg | ORAL_CAPSULE | Freq: Two times a day (BID) | ORAL | 1 refills | Status: DC

## 2022-06-29 NOTE — Progress Notes (Signed)
Subjective:  Chief complaint: Hearing and noise when she walks at her prosthetic hip as well as pain in that hip and pain in her knee more so at night after a day of walking  Patient ID: [name], female    DOB: 1946-08-17, 76 y.o.   MRN: [id number]  HPI     76 year old black woman with a past medical history significant for rheumatoid arthritis having been on etanercept and methotrexate diabetes mellitus hypertension hyperlipidemia who has had bilateral hip replacements lumbar laminectomy decompression and microdiscectomy in 2019 prior bilateral septic shoulders and MSSA bacteremia treated in 2012 or was it 2004 per patient and daughter with 6 weeks of cefazolin after successful surgery.  She presented to the ER on 25 January 2022   with acute on chronic worsening of her upper thoracic back pain.  Then seen by PCP and noted to be confused came to the ER with worsening fevers and chills.   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  She had initially been on broad-spectrum antibiotics in the form of vancomycin and cefepime and then ceftriaxone.  Blood cultures were without growth and IR guided aspirate of the disc space yielded methicillin sensitive Staphylococcus aureus.   There additionally been concern for possible cervical spine disease.  MRI of her thoracic spine was performed on May 4 which showed progressive findings in the thoracic spine from T8-T10 with progression of osteomyelitis with inflammation and spinal cord compression maximal at T8 with continued presence of an epidural phlegmon and dural thickening and enhancement.  She also had more degeneration of her thoracic spine 10 through 11.  MRI of the cervical spine fortunately did not show infectious pathology.  She was taken to the operating room by Dr. Wendee Copp T7, T8, T9, T10 and T11 laminectomy for drainage of epidural abscess; T7-8, T8-9, T9-10, T10-11  posterior lateral arthrodesis with local morselized autograft bone    Cultures were unrevealing but she had been on antibiotics.  The clock was restarted on IV antibiotics and she was placed on cefazolin.  She was dramatically better after surgery.  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 Mayer Camel is going to need to perform surgery to place at least a component of it.  He has asked for operative clearance.  From infectious standpoint I think she is clear for surgery though would like her to remain on antibiotics for her spine given how much she is gone through with surgery and given my concern about risk for recurrence despite the fact that she does not have hardware   Past Medical History:  Diagnosis Date   Arthritis    "knees, ankles, fingers" (07/06/2018)   Asthma    Chronic lower back pain    Dyspnea    with asthma attacks    Dysrhythmia    Family history of adverse reaction to anesthesia    my first cousin had difficulty waking up    Fibroid    ovarian   GERD (gastroesophageal reflux disease)    Hip osteoarthritis    Left   History of staph infection    in hospital for 11 days/ in 2007   Hyperlipidemia    Hypertension    Radiculopathy    Tremor 03/30/2022   Type II diabetes mellitus ([place])    Upper extremity weakness 03/30/2022   Wears glasses     Past Surgical History:  Procedure  Laterality Date   ANTERIOR CERVICAL DECOMP/DISCECTOMY FUSION N/A 12/11/2015   Procedure: ANTERIOR CERVICAL DECOMPRESSION/DISCECTOMY FUSION 2 LEVELS;  Surgeon: Phylliss Bob, MD;  Location: [facility];  Service: Orthopedics;  Laterality: N/A;  Anterior cervical decompression fusion, cervical 6-7, cervical 7-thoracic 1 with instrumentation and allograft   BACK SURGERY     CARDIAC CATHETERIZATION  1998   CATARACT EXTRACTION W/ INTRAOCULAR LENS  IMPLANT, BILATERAL Bilateral 2015   Bil   COLONOSCOPY     CRYOABLATION  1988   "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  10 yr before hysterectomy"   INCISION AND DRAINAGE Bilateral 2007>   "cleaned staph out of shoulders"   IR FLUORO GUIDED NEEDLE PLC ASPIRATION/INJECTION LOC  01/27/2022   IR FLUORO GUIDED NEEDLE PLC ASPIRATION/INJECTION LOC  01/27/2022   JOINT REPLACEMENT     LAPAROSCOPIC OVARIAN CYSTECTOMY  1970   LUMBAR LAMINECTOMY/DECOMPRESSION MICRODISCECTOMY N/A 01/31/2018   Procedure: LAMINECTOMY AND FORAMINOTOMY LUMBAR TWO- LUMBAR THREE, LUMBAR THREE- LUMBAR FOUR, LUMBAR FOUR- LUMBAR FIVE ;  Surgeon: Newman Pies, MD;  Location: [facility];  Service: Neurosurgery;  Laterality: N/A;   LUMBAR LAMINECTOMY/DECOMPRESSION MICRODISCECTOMY N/A 04/13/2022   Procedure: Thoracic seven,Thoracic eight, Thoracic nine, Thoracic ten LAMINECTOMY;  Surgeon: Newman Pies, MD;  Location: [facility];  Service: Neurosurgery;  Laterality: N/A;   POSTERIOR CERVICAL LAMINECTOMY  ~ [place] Right 02/2003   THUMB FUSION Right 2010   right thumb   TONSILLECTOMY     TOTAL ABDOMINAL HYSTERECTOMY  1998   TAH,BSO   TOTAL HIP ARTHROPLASTY Right 05/18/2018   Procedure: TOTAL HIP ARTHROPLASTY ANTERIOR APPROACH;  Surgeon: Frederik Pear, MD;  Location: [facility];  Service: Orthopedics;  Laterality: Right;   TOTAL HIP ARTHROPLASTY Left 07/06/2018   TOTAL HIP ARTHROPLASTY Left 07/06/2018   Procedure: LEFT TOTAL HIP ARTHROPLASTY ANTERIOR APPROACH;  Surgeon: Frederik Pear, MD;  Location: [facility];  Service: Orthopedics;  Laterality: Left;  Needs RNFA   TUBAL LIGATION      Family History  Problem Relation Age of Onset   Diabetes Sister    Diabetes Brother    Diabetes Brother    Diabetes Paternal Uncle    Cancer Paternal Aunt        UTERINE   Hypertension Maternal Grandmother    Heart disease Maternal Grandmother    Colon cancer Neg Hx       Social History   Socioeconomic History   Marital status: Married    Spouse name: Not on file   Number of children: Not on file   Years of education: Not on file   Highest  education level: Not on file  Occupational History   Not on file  Tobacco Use   Smoking status: Former    Packs/day: 1.00    Years: 25.00    Total pack years: 25.00    Types: Cigarettes    Quit date: 11/23/1992    Years since quitting: 29.6   Smokeless tobacco: Never  Vaping Use   Vaping Use: Never used  Substance and Sexual Activity   Alcohol use: Not Currently   Drug use: Never   Sexual activity: Not Currently  Other Topics Concern   Not on file  Social History Narrative   Not on file   Social Determinants of Health   Financial Resource Strain: Not on file  Food Insecurity: No Food Insecurity (05/20/2020)   Hunger Vital Sign    Worried About Running Out of Food in the Last Year: Never true  Ran Out of Food in the Last Year: Never true  Transportation Needs: No Transportation Needs (05/20/2020)   PRAPARE - Hydrologist (Medical): No    Lack of Transportation (Non-Medical): No  Physical Activity: Not on file  Stress: Not on file  Social Connections: Not on file    Allergies  Allergen Reactions   Ivp Dye [Iodinated Contrast Media] Nausea And Vomiting   Prednisone Other (See Comments)    reflux     Current Outpatient Medications:    albuterol (ACCUNEB) 1.25 MG/3ML nebulizer solution, Take 1 ampule by nebulization every 4 (four) hours as needed for wheezing., Disp: , Rfl:    albuterol (VENTOLIN HFA) 108 (90 Base) MCG/ACT inhaler, Inhale 2 puffs into the lungs every 6 (six) hours as needed for wheezing or shortness of breath., Disp: , Rfl:    Blood Glucose Monitoring Suppl (ONE TOUCH ULTRA 2) w/Device KIT, Check blood sugar twice per week, Disp: , Rfl:    bumetanide (BUMEX) 1 MG tablet, Take 1 mg by mouth 2 (two) times daily., Disp: , Rfl:    cetirizine (ZYRTEC) 10 MG tablet, Take 10 mg by mouth daily., Disp: , Rfl:    cholecalciferol (VITAMIN D3) 25 MCG (1000 UNIT) tablet, Take 1,000 Units by mouth daily., Disp: , Rfl:    colchicine 0.6 MG  tablet, , Disp: , Rfl:    cyclobenzaprine (FLEXERIL) 5 MG tablet, Take 1 tablet (5 mg total) by mouth 3 (three) times daily as needed for muscle spasms., Disp: 30 tablet, Rfl: 0   diltiazem (CARDIZEM CD) 180 MG 24 hr capsule, Take 180 mg by mouth daily., Disp: , Rfl:    docusate sodium (COLACE) 100 MG capsule, Take 100 mg by mouth daily as needed for mild constipation., Disp: , Rfl:    enalapril (VASOTEC) 20 MG tablet, Take 20 mg by mouth 2 (two) times daily., Disp: , Rfl:    folic acid (FOLVITE) 989 MCG tablet, Take 800 mcg by mouth daily., Disp: , Rfl:    gabapentin (NEURONTIN) 300 MG capsule, Take 300 mg by mouth at bedtime., Disp: , Rfl:    glucose blood test strip, , Disp: , Rfl:    hydrOXYzine (ATARAX) 10 MG tablet, Take 10-20 mg by mouth 2 (two) times daily as needed for anxiety., Disp: , Rfl:    Lancets (ONETOUCH DELICA PLUS [id number]) MISC, Use to check blood sugar, Disp: , Rfl:    lovastatin (MEVACOR) 40 MG tablet, Take 40 mg by mouth at bedtime. , Disp: , Rfl:    meclizine (ANTIVERT) 12.5 MG tablet, Take 12.5 mg by mouth 2 (two) times daily as needed for dizziness., Disp: , Rfl:    metFORMIN (GLUCOPHAGE-XR) 500 MG 24 hr tablet, Take 500 mg by mouth in the morning and at bedtime., Disp: , Rfl:    Multiple Vitamin (MULTIVITAMIN WITH MINERALS) TABS tablet, Take 1 tablet by mouth daily., Disp: , Rfl:    omeprazole (PRILOSEC OTC) 20 MG tablet, Take 20 mg by mouth daily as needed (heartburn)., Disp: , Rfl:    ONETOUCH ULTRA test strip, , Disp: , Rfl:    oxyCODONE-acetaminophen (PERCOCET/ROXICET) 5-325 MG tablet, Take 1-2 tablets by mouth every 4 (four) hours as needed for moderate pain., Disp: 30 tablet, Rfl: 0   sodium chloride (OCEAN) 0.65 % SOLN nasal spray, Place 1-2 sprays into the nose 4 (four) times daily as needed for congestion., Disp: , Rfl:    TRELEGY ELLIPTA 200-62.5-25 MCG/ACT AEPB, Take 1 puff  by mouth daily., Disp: , Rfl:    betamethasone dipropionate 0.05 % cream, Apply 1  application. topically daily as needed (EAR). (Patient not taking: Reported on 04/21/2022), Disp: , Rfl:    ceFAZolin (ANCEF) 10 g injection, , Disp: , Rfl:    diclofenac Sodium (VOLTAREN) 1 % GEL, Apply 4 g topically 4 (four) times daily. (Patient not taking: Reported on 04/21/2022), Disp: 100 g, Rfl: 0   Potassium Chloride ER 20 MEQ TBCR, 20 mEq daily. (Patient not taking: Reported on 04/21/2022), Disp: , Rfl:     Review of Systems  Constitutional:  Negative for activity change, appetite change, chills, diaphoresis, fatigue, fever and unexpected weight change.  HENT:  Negative for congestion, rhinorrhea, sinus pressure, sneezing, sore throat and trouble swallowing.   Eyes:  Negative for photophobia and visual disturbance.  Respiratory:  Negative for cough, chest tightness, shortness of breath, wheezing and stridor.   Cardiovascular:  Negative for chest pain, palpitations and leg swelling.  Gastrointestinal:  Negative for abdominal distention, abdominal pain, anal bleeding, blood in stool, constipation, diarrhea, nausea and vomiting.  Genitourinary:  Negative for difficulty urinating, dysuria, flank pain and hematuria.  Musculoskeletal:  Positive for arthralgias and gait problem. Negative for back pain, joint swelling and myalgias.  Skin:  Negative for color change, pallor, rash and wound.  Neurological:  Negative for dizziness, tremors, weakness and light-headedness.  Hematological:  Negative for adenopathy. Does not bruise/bleed easily.  Psychiatric/Behavioral:  Negative for agitation, behavioral problems, confusion, decreased concentration, dysphoric mood and sleep disturbance.        Objective:   Physical Exam Constitutional:      General: She is not in acute distress.    Appearance: Normal appearance. She is well-developed. She is not ill-appearing or diaphoretic.  HENT:     Head: Normocephalic and atraumatic.     Right Ear: Hearing and external ear normal.     Left Ear: Hearing and  external ear normal.     Nose: No nasal deformity or rhinorrhea.  Eyes:     General: No scleral icterus.    Conjunctiva/sclera: Conjunctivae normal.     Right eye: Right conjunctiva is not injected.     Left eye: Left conjunctiva is not injected.     Pupils: Pupils are equal, round, and reactive to light.  Neck:     Vascular: No JVD.  Cardiovascular:     Rate and Rhythm: Normal rate and regular rhythm.     Heart sounds: S1 normal and S2 normal.  Abdominal:     General: Bowel sounds are normal. There is no distension.     Palpations: Abdomen is soft.  Musculoskeletal:        General: Normal range of motion.     Right shoulder: Normal.     Left shoulder: Normal.     Cervical back: Normal range of motion and neck supple.     Right hip: Normal.     Left hip: Normal.     Right knee: Normal.     Left knee: Normal.  Lymphadenopathy:     Head:     Right side of head: No submandibular, preauricular or posterior auricular adenopathy.     Left side of head: No submandibular, preauricular or posterior auricular adenopathy.     Cervical: No cervical adenopathy.     Right cervical: No superficial or deep cervical adenopathy.    Left cervical: No superficial or deep cervical adenopathy.  Skin:    General: Skin is warm and  dry.     Coloration: Skin is not pale.     Findings: No abrasion, bruising, ecchymosis, erythema, lesion or rash.     Nails: There is no clubbing.  Neurological:     Mental Status: She is alert and oriented to person, place, and time.     Sensory: No sensory deficit.     Coordination: Coordination normal.     Gait: Gait normal.  Psychiatric:        Attention and Perception: She is attentive.        Mood and Affect: Mood normal.        Speech: Speech normal.        Behavior: Behavior normal. Behavior is cooperative.        Thought Content: Thought content normal.        Judgment: Judgment normal.           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  Her infection seems to be under control if not cured.  I would prefer however to err on the side of caution and protract her antibiotics further.  I have sent in 81-monthworth of medications with a refill on it.    We will check a sed rate CRP keeping in mind the confounding factor of her rheumatoid arthritis and repeat BMP with GFR and CBC with differential  Right prosthetic hip dysfunction: I feel she is safe from an infectious disease standpoint to proceed with surgical intervention with Dr. RMayer Camel  RA: she will avoid biologics.  We will also keep in mind her autoimmune condition and when interpreting her inflammatory markers

## 2022-06-30 DIAGNOSIS — E669 Obesity, unspecified: Secondary | ICD-10-CM | POA: Diagnosis not present

## 2022-06-30 DIAGNOSIS — I700 Atherosclerosis of aorta: Secondary | ICD-10-CM | POA: Diagnosis not present

## 2022-06-30 DIAGNOSIS — E782 Mixed hyperlipidemia: Secondary | ICD-10-CM | POA: Diagnosis not present

## 2022-06-30 DIAGNOSIS — N182 Chronic kidney disease, stage 2 (mild): Secondary | ICD-10-CM | POA: Diagnosis not present

## 2022-06-30 DIAGNOSIS — E1142 Type 2 diabetes mellitus with diabetic polyneuropathy: Secondary | ICD-10-CM | POA: Diagnosis not present

## 2022-06-30 DIAGNOSIS — J432 Centrilobular emphysema: Secondary | ICD-10-CM | POA: Diagnosis not present

## 2022-06-30 DIAGNOSIS — Z01818 Encounter for other preprocedural examination: Secondary | ICD-10-CM | POA: Diagnosis not present

## 2022-06-30 DIAGNOSIS — M0609 Rheumatoid arthritis without rheumatoid factor, multiple sites: Secondary | ICD-10-CM | POA: Diagnosis not present

## 2022-06-30 LAB — BASIC METABOLIC PANEL WITH GFR
BUN/Creatinine Ratio: 29 (calc) — ABNORMAL HIGH (ref 6–22)
BUN: 26 mg/dL — ABNORMAL HIGH (ref 7–25)
CO2: 30 mmol/L (ref 20–32)
Calcium: 9.8 mg/dL (ref 8.6–10.4)
Chloride: 106 mmol/L (ref 98–110)
Creat: 0.91 mg/dL (ref 0.60–1.00)
Glucose, Bld: 113 mg/dL — ABNORMAL HIGH (ref 65–99)
Potassium: 3.8 mmol/L (ref 3.5–5.3)
Sodium: 145 mmol/L (ref 135–146)
eGFR: 66 mL/min/{1.73_m2} (ref >=60)

## 2022-06-30 LAB — CBC WITH DIFFERENTIAL/PLATELET
Absolute Monocytes: 517 cells/uL (ref 200–950)
Basophils Absolute: 61 cells/uL (ref 0–200)
Basophils Relative: 0.9 %
Eosinophils Absolute: 204 cells/uL (ref 15–500)
Eosinophils Relative: 3 %
HCT: 32.7 % — ABNORMAL LOW (ref 35.0–45.0)
Hemoglobin: 10.8 g/dL — ABNORMAL LOW (ref 11.7–15.5)
Lymphs Abs: 1659 cells/uL (ref 850–3900)
MCH: 26.6 pg — ABNORMAL LOW (ref 27.0–33.0)
MCHC: 33 g/dL (ref 32.0–36.0)
MCV: 80.5 fL (ref 80.0–100.0)
MPV: 10.5 fL (ref 7.5–12.5)
Monocytes Relative: 7.6 %
Neutro Abs: 4359 cells/uL (ref 1500–7800)
Neutrophils Relative %: 64.1 %
Platelets: 339 10*3/uL (ref 140–400)
RBC: 4.06 10*6/uL (ref 3.80–5.10)
RDW: 14.1 % (ref 11.0–15.0)
Total Lymphocyte: 24.4 %
WBC: 6.8 10*3/uL (ref 3.8–10.8)

## 2022-06-30 LAB — C-REACTIVE PROTEIN: CRP: 4.3 mg/L (ref <8.0)

## 2022-06-30 LAB — SEDIMENTATION RATE: Sed Rate: 29 mm/h (ref 0–30)

## 2022-07-01 DIAGNOSIS — M25551 Pain in right hip: Secondary | ICD-10-CM | POA: Diagnosis not present

## 2022-07-01 DIAGNOSIS — M0609 Rheumatoid arthritis without rheumatoid factor, multiple sites: Secondary | ICD-10-CM | POA: Diagnosis not present

## 2022-07-01 DIAGNOSIS — M4620 Osteomyelitis of vertebra, site unspecified: Secondary | ICD-10-CM | POA: Diagnosis not present

## 2022-07-01 DIAGNOSIS — M1990 Unspecified osteoarthritis, unspecified site: Secondary | ICD-10-CM | POA: Diagnosis not present

## 2022-07-01 DIAGNOSIS — Z79899 Other long term (current) drug therapy: Secondary | ICD-10-CM | POA: Diagnosis not present

## 2022-07-01 DIAGNOSIS — M25512 Pain in left shoulder: Secondary | ICD-10-CM | POA: Diagnosis not present

## 2022-07-06 ENCOUNTER — Other Ambulatory Visit: Payer: Self-pay

## 2022-07-06 MED ORDER — CEFADROXIL 500 MG PO CAPS: 1000.0000 mg | ORAL_CAPSULE | Freq: Two times a day (BID) | ORAL | 1 refills | Status: DC

## 2022-07-07 DIAGNOSIS — M4624 Osteomyelitis of vertebra, thoracic region: Secondary | ICD-10-CM | POA: Diagnosis not present

## 2022-07-08 ENCOUNTER — Other Ambulatory Visit: Payer: Self-pay | Admitting: Orthopedic Surgery

## 2022-07-16 DIAGNOSIS — I10 Essential (primary) hypertension: Secondary | ICD-10-CM | POA: Diagnosis not present

## 2022-07-16 DIAGNOSIS — N182 Chronic kidney disease, stage 2 (mild): Secondary | ICD-10-CM | POA: Diagnosis not present

## 2022-07-16 DIAGNOSIS — J432 Centrilobular emphysema: Secondary | ICD-10-CM | POA: Diagnosis not present

## 2022-07-16 DIAGNOSIS — E782 Mixed hyperlipidemia: Secondary | ICD-10-CM | POA: Diagnosis not present

## 2022-07-16 DIAGNOSIS — I700 Atherosclerosis of aorta: Secondary | ICD-10-CM | POA: Diagnosis not present

## 2022-07-16 DIAGNOSIS — E1142 Type 2 diabetes mellitus with diabetic polyneuropathy: Secondary | ICD-10-CM | POA: Diagnosis not present

## 2022-07-16 DIAGNOSIS — M0609 Rheumatoid arthritis without rheumatoid factor, multiple sites: Secondary | ICD-10-CM | POA: Diagnosis not present

## 2022-07-22 NOTE — Progress Notes (Signed)
COVID Vaccine Completed: yes x3  Date of COVID positive in last 90 days:  PCP - Merrilee Seashore, MD Cardiologist -   Chest x-ray - 01/26/22 Epic EKG - 01/26/22 tachy and LBBB Stress Test -  ECHO - 01/27/22 Epic Cardiac Cath -  Pacemaker/ICD device last checked: Spinal Cord Stimulator:  Bowel Prep -   Sleep Study -  CPAP -   Fasting Blood Sugar -  Checks Blood Sugar _____ times a day  Blood Thinner Instructions: Aspirin Instructions: Last Dose:  Activity level:  Can go up a flight of stairs and perform activities of daily living without stopping and without symptoms of chest pain or shortness of breath.  Able to exercise without symptoms  Unable to go up a flight of stairs without symptoms of     Anesthesia review: sepsis march 2023, HTN, DM2  Patient denies shortness of breath, fever, cough and chest pain at PAT appointment  Patient verbalized understanding of instructions that were given to them at the PAT appointment. Patient was also instructed that they will need to review over the PAT instructions again at home before surgery.

## 2022-07-22 NOTE — Patient Instructions (Signed)
SURGICAL WAITING ROOM VISITATION Patients having surgery or a procedure may have no more than 2 support people in the waiting area - these visitors may rotate.   Children under the age of 58 must have an adult with them who is not the patient. If the patient needs to stay at the hospital during part of their recovery, the visitor guidelines for inpatient rooms apply. Pre-op nurse will coordinate an appropriate time for 1 support person to accompany patient in pre-op.  This support person may not rotate.    Please refer to the [facility] website for the visitor guidelines for Inpatients (after your surgery is over and you are in a regular room).    Your procedure is scheduled on: 08/03/22   Report to [facility] Main Entrance    Report to admitting at 5:15 AM   Call this number if you have problems the morning of surgery [phone]   Do not eat food :After Midnight.   After Midnight you may have the following liquids until 4:15 AM DAY OF SURGERY  Water Non-Citrus Juices (without pulp, NO RED) Carbonated Beverages Black Coffee (NO MILK/CREAM OR CREAMERS, sugar ok)  Clear Tea (NO MILK/CREAM OR CREAMERS, sugar ok) regular and decaf                             Plain Jell-O (NO RED)                                           Fruit ices (not with fruit pulp, NO RED)                                     Popsicles (NO RED)                                                               Sports drinks like Gatorade (NO RED)                 The day of surgery:  Drink ONE (1) Pre-Surgery G2 at 4:15 AM the morning of surgery. Drink in one sitting. Do not sip.  This drink was given to you during your hospital  pre-op appointment visit. Nothing else to drink after completing the  Pre-Surgery G2.          If you have questions, please contact your surgeon's office.   FOLLOW BOWEL PREP AND ANY ADDITIONAL PRE OP INSTRUCTIONS YOU RECEIVED FROM YOUR SURGEON'S OFFICE!!!     Oral Hygiene  is also important to reduce your risk of infection.                                    Remember - BRUSH YOUR TEETH THE MORNING OF SURGERY WITH YOUR REGULAR TOOTHPASTE   Take these medicines the morning of surgery with A SIP OF WATER: Inhalers, Zyrtec, Diltiazem, Omeprazole  DO NOT TAKE ANY ORAL DIABETIC MEDICATIONS DAY OF YOUR SURGERY  How to Manage Your Diabetes  Before and After Surgery  Why is it important to control my blood sugar before and after surgery? Improving blood sugar levels before and after surgery helps healing and can limit problems. A way of improving blood sugar control is eating a healthy diet by:  Eating less sugar and carbohydrates  Increasing activity/exercise  Talking with your doctor about reaching your blood sugar goals High blood sugars (greater than 180 mg/dL) can raise your risk of infections and slow your recovery, so you will need to focus on controlling your diabetes during the weeks before surgery. Make sure that the doctor who takes care of your diabetes knows about your planned surgery including the date and location.  How do I manage my blood sugar before surgery? Check your blood sugar at least 4 times a day, starting 2 days before surgery, to make sure that the level is not too high or low. Check your blood sugar the morning of your surgery when you wake up and every 2 hours until you get to the Short Stay unit. If your blood sugar is less than 70 mg/dL, you will need to treat for low blood sugar: Do not take insulin. Treat a low blood sugar (less than 70 mg/dL) with  cup of clear juice (cranberry or apple), 4 glucose tablets, OR glucose gel. Recheck blood sugar in 15 minutes after treatment (to make sure it is greater than 70 mg/dL). If your blood sugar is not greater than 70 mg/dL on recheck, call [phone] for further instructions. Report your blood sugar to the short stay nurse when you get to Short Stay.  If you are admitted to the hospital  after surgery: Your blood sugar will be checked by the staff and you will probably be given insulin after surgery (instead of oral diabetes medicines) to make sure you have good blood sugar levels. The goal for blood sugar control after surgery is 80-180 mg/dL.   WHAT DO I DO ABOUT MY DIABETES MEDICATION?  Do not take oral diabetes medicines (pills) the morning of surgery.  THE NIGHT BEFORE SURGERY, take Metformin as prescribed.     THE MORNING OF SURGERY, do not take Metformin.  Reviewed and Endorsed by [facility] Patient Education Committee, August 2015              You may not have any metal on your body including hair pins, jewelry, and body piercing             Do not wear make-up, lotions, powders, perfumes/cologne, or deodorant  Do not wear nail polish including gel and S&S, artificial/acrylic nails, or any other type of covering on natural nails including finger and toenails. If you have artificial nails, gel coating, etc. that needs to be removed by a nail salon please have this removed prior to surgery or surgery may need to be canceled/ delayed if the surgeon/ anesthesia feels like they are unable to be safely monitored.   Do not shave  48 hours prior to surgery.               Men may shave face and neck.   Do not bring valuables to the hospital. [place].   Bring small overnight bag day of surgery.   DO NOT [place]. PHARMACY WILL DISPENSE MEDICATIONS LISTED ON YOUR MEDICATION LIST TO YOU DURING YOUR ADMISSION IN  [place]!               Please read over the following fact sheets you were given: IF YOU HAVE QUESTIONS ABOUT YOUR PRE-OP INSTRUCTIONS PLEASE CALL [facility] - Preparing for Surgery Before surgery, you can play an important role.  Because skin is not sterile, your skin needs to be as free of germs as possible.  You can reduce the number of germs  on your skin by washing with CHG (chlorahexidine gluconate) soap before surgery.  CHG is an antiseptic cleaner which kills germs and bonds with the skin to continue killing germs even after washing. Please DO NOT use if you have an allergy to CHG or antibacterial soaps.  If your skin becomes reddened/irritated stop using the CHG and inform your nurse when you arrive at Short Stay. Do not shave (including legs and underarms) for at least 48 hours prior to the first CHG shower.  You may shave your face/neck.  Please follow these instructions carefully:  1.  Shower with CHG Soap the night before surgery and the  morning of surgery.  2.  If you choose to wash your hair, wash your hair first as usual with your normal  shampoo.  3.  After you shampoo, rinse your hair and body thoroughly to remove the shampoo.                             4.  Use CHG as you would any other liquid soap.  You can apply chg directly to the skin and wash.  Gently with a scrungie or clean washcloth.  5.  Apply the CHG Soap to your body ONLY FROM THE NECK DOWN.   Do   not use on face/ open                           Wound or open sores. Avoid contact with eyes, ears mouth and   genitals (private parts).                       Wash face,  Genitals (private parts) with your normal soap.             6.  Wash thoroughly, paying special attention to the area where your    surgery  will be performed.  7.  Thoroughly rinse your body with warm water from the neck down.  8.  DO NOT shower/wash with your normal soap after using and rinsing off the CHG Soap.                9.  Pat yourself dry with a clean towel.            10.  Wear clean pajamas.            11.  Place clean sheets on your bed the night of your first shower and do not  sleep with pets. Day of Surgery : Do not apply any lotions/deodorants the morning of surgery.  Please wear clean clothes to the hospital/surgery center.  FAILURE TO FOLLOW THESE INSTRUCTIONS MAY RESULT IN  THE CANCELLATION OF YOUR SURGERY  PATIENT SIGNATURE_________________________________  NURSE SIGNATURE__________________________________  ________________________________________________________________________   Adam Phenix  An incentive spirometer is a tool that can help keep your lungs clear and active. This tool measures how well you are filling your  lungs with each breath. Taking long deep breaths may help reverse or decrease the chance of developing breathing (pulmonary) problems (especially infection) following: A long period of time when you are unable to move or be active. BEFORE THE PROCEDURE  If the spirometer includes an indicator to show your best effort, your nurse or respiratory therapist will set it to a desired goal. If possible, sit up straight or lean slightly forward. Try not to slouch. Hold the incentive spirometer in an upright position. INSTRUCTIONS FOR USE  Sit on the edge of your bed if possible, or sit up as far as you can in bed or on a chair. Hold the incentive spirometer in an upright position. Breathe out normally. Place the mouthpiece in your mouth and seal your lips tightly around it. Breathe in slowly and as deeply as possible, raising the piston or the ball toward the top of the column. Hold your breath for 3-5 seconds or for as long as possible. Allow the piston or ball to fall to the bottom of the column. Remove the mouthpiece from your mouth and breathe out normally. Rest for a few seconds and repeat Steps 1 through 7 at least 10 times every 1-2 hours when you are awake. Take your time and take a few normal breaths between deep breaths. The spirometer may include an indicator to show your best effort. Use the indicator as a goal to work toward during each repetition. After each set of 10 deep breaths, practice coughing to be sure your lungs are clear. If you have an incision (the cut made at the time of surgery), support your incision when  coughing by placing a pillow or rolled up towels firmly against it. Once you are able to get out of bed, walk around indoors and cough well. You may stop using the incentive spirometer when instructed by your caregiver.  RISKS AND COMPLICATIONS Take your time so you do not get dizzy or light-headed. If you are in pain, you may need to take or ask for pain medication before doing incentive spirometry. It is harder to take a deep breath if you are having pain. AFTER USE Rest and breathe slowly and easily. It can be helpful to keep track of a log of your progress. Your caregiver can provide you with a simple table to help with this. If you are using the spirometer at home, follow these instructions: [place] IF:  You are having difficultly using the spirometer. You have trouble using the spirometer as often as instructed. Your pain medication is not giving enough relief while using the spirometer. You develop fever of 100.5 F (38.1 C) or higher. SEEK IMMEDIATE MEDICAL CARE IF:  You cough up bloody sputum that had not been present before. You develop fever of 102 F (38.9 C) or greater. You develop worsening pain at or near the incision site. MAKE SURE YOU:  Understand these instructions. Will watch your condition. Will get help right away if you are not doing well or get worse. Document Released: 03/22/2007 Document Revised: 02/01/2012 Document Reviewed: 05/23/2007 ExitCare Patient Information 2014 Exit[employer], [place].   ________________________________________________________________________  WHAT IS A BLOOD TRANSFUSION? Blood Transfusion Information  A transfusion is the replacement of blood or some of its parts. Blood is made up of multiple cells which provide different functions. Red blood cells carry oxygen and are used for blood loss replacement. White blood cells fight against infection. Platelets control bleeding. Plasma helps clot blood. Other blood products are  available for specialized needs, such as hemophilia or other clotting disorders. BEFORE THE TRANSFUSION  Who gives blood for transfusions?  Healthy volunteers who are fully evaluated to make sure their blood is safe. This is blood bank blood. Transfusion therapy is the safest it has ever been in the practice of medicine. Before blood is taken from a donor, a complete history is taken to make sure that person has no history of diseases nor engages in risky social behavior (examples are intravenous drug use or sexual activity with multiple partners). The donor's travel history is screened to minimize risk of transmitting infections, such as malaria. The donated blood is tested for signs of infectious diseases, such as HIV and hepatitis. The blood is then tested to be sure it is compatible with you in order to minimize the chance of a transfusion reaction. If you or a relative donates blood, this is often done in anticipation of surgery and is not appropriate for emergency situations. It takes many days to process the donated blood. RISKS AND COMPLICATIONS Although transfusion therapy is very safe and saves many lives, the main dangers of transfusion include:  Getting an infectious disease. Developing a transfusion reaction. This is an allergic reaction to something in the blood you were given. Every precaution is taken to prevent this. The decision to have a blood transfusion has been considered carefully by your caregiver before blood is given. Blood is not given unless the benefits outweigh the risks. AFTER THE TRANSFUSION Right after receiving a blood transfusion, you will usually feel much better and more energetic. This is especially true if your red blood cells have gotten low (anemic). The transfusion raises the level of the red blood cells which carry oxygen, and this usually causes an energy increase. The nurse administering the transfusion will monitor you carefully for complications. HOME CARE  INSTRUCTIONS  No special instructions are needed after a transfusion. You may find your energy is better. Speak with your caregiver about any limitations on activity for underlying diseases you may have. SEEK MEDICAL CARE IF:  Your condition is not improving after your transfusion. You develop redness or irritation at the intravenous (IV) site. SEEK IMMEDIATE MEDICAL CARE IF:  Any of the following symptoms occur over the next 12 hours: Shaking chills. You have a temperature by mouth above 102 F (38.9 C), not controlled by medicine. Chest, back, or muscle pain. People around you feel you are not acting correctly or are confused. Shortness of breath or difficulty breathing. Dizziness and fainting. You get a rash or develop hives. You have a decrease in urine output. Your urine turns a dark color or changes to pink, red, or brown. Any of the following symptoms occur over the next 10 days: You have a temperature by mouth above 102 F (38.9 C), not controlled by medicine. Shortness of breath. Weakness after normal activity. The white part of the eye turns yellow (jaundice). You have a decrease in the amount of urine or are urinating less often. Your urine turns a dark color or changes to pink, red, or brown. Document Released: 11/06/2000 Document Revised: 02/01/2012 Document Reviewed: 06/25/2008 [facility] Patient Information 2014 [place], [place].  _______________________________________________________________________

## 2022-07-23 ENCOUNTER — Encounter (HOSPITAL_COMMUNITY)
Admission: RE | Admit: 2022-07-23 | Discharge: 2022-07-23 | Disposition: A | Discharge status: Home / Self Care | Payer: HMO | Source: Ambulatory Visit | Attending: Orthopedic Surgery | Admitting: Orthopedic Surgery

## 2022-07-23 ENCOUNTER — Encounter (HOSPITAL_COMMUNITY): Payer: Self-pay

## 2022-07-23 VITALS — BP 125/74 | HR 100 | Temp 98.5°F | Resp 16 | Ht <= 58 in | Wt 141.0 lb

## 2022-07-23 DIAGNOSIS — N182 Chronic kidney disease, stage 2 (mild): Secondary | ICD-10-CM | POA: Diagnosis not present

## 2022-07-23 DIAGNOSIS — N189 Chronic kidney disease, unspecified: Secondary | ICD-10-CM | POA: Insufficient documentation

## 2022-07-23 DIAGNOSIS — Z01818 Encounter for other preprocedural examination: Secondary | ICD-10-CM | POA: Insufficient documentation

## 2022-07-23 DIAGNOSIS — I129 Hypertensive chronic kidney disease with stage 1 through stage 4 chronic kidney disease, or unspecified chronic kidney disease: Secondary | ICD-10-CM | POA: Diagnosis not present

## 2022-07-23 DIAGNOSIS — E1122 Type 2 diabetes mellitus with diabetic chronic kidney disease: Secondary | ICD-10-CM | POA: Insufficient documentation

## 2022-07-23 DIAGNOSIS — I700 Atherosclerosis of aorta: Secondary | ICD-10-CM | POA: Diagnosis not present

## 2022-07-23 DIAGNOSIS — E1142 Type 2 diabetes mellitus with diabetic polyneuropathy: Secondary | ICD-10-CM | POA: Diagnosis not present

## 2022-07-23 DIAGNOSIS — J432 Centrilobular emphysema: Secondary | ICD-10-CM | POA: Diagnosis not present

## 2022-07-23 DIAGNOSIS — Z87891 Personal history of nicotine dependence: Secondary | ICD-10-CM | POA: Diagnosis not present

## 2022-07-23 DIAGNOSIS — J45909 Unspecified asthma, uncomplicated: Secondary | ICD-10-CM | POA: Diagnosis not present

## 2022-07-23 DIAGNOSIS — E1121 Type 2 diabetes mellitus with diabetic nephropathy: Secondary | ICD-10-CM

## 2022-07-23 DIAGNOSIS — Z7984 Long term (current) use of oral hypoglycemic drugs: Secondary | ICD-10-CM | POA: Insufficient documentation

## 2022-07-23 DIAGNOSIS — M0609 Rheumatoid arthritis without rheumatoid factor, multiple sites: Secondary | ICD-10-CM | POA: Diagnosis not present

## 2022-07-23 DIAGNOSIS — E669 Obesity, unspecified: Secondary | ICD-10-CM | POA: Diagnosis not present

## 2022-07-23 DIAGNOSIS — E782 Mixed hyperlipidemia: Secondary | ICD-10-CM | POA: Diagnosis not present

## 2022-07-23 DIAGNOSIS — Z96641 Presence of right artificial hip joint: Secondary | ICD-10-CM | POA: Insufficient documentation

## 2022-07-23 HISTORY — DX: Anxiety disorder, unspecified: F41.9

## 2022-07-23 LAB — BASIC METABOLIC PANEL
Anion gap: 11 (ref 5–15)
BUN: 24 mg/dL — ABNORMAL HIGH (ref 8–23)
CO2: 25 mmol/L (ref 22–32)
Calcium: 10 mg/dL (ref 8.9–10.3)
Chloride: 108 mmol/L (ref 98–111)
Creatinine, Ser: 0.99 mg/dL (ref 0.44–1.00)
GFR, Estimated: 60 mL/min — ABNORMAL LOW (ref >60)
Glucose, Bld: 100 mg/dL — ABNORMAL HIGH (ref 70–99)
Potassium: 4.5 mmol/L (ref 3.5–5.1)
Sodium: 144 mmol/L (ref 135–145)

## 2022-07-23 LAB — TYPE AND SCREEN
ABO/RH(D): AB NEG
Antibody Screen: NEGATIVE

## 2022-07-23 LAB — CBC
HCT: 37.8 % (ref 36.0–46.0)
Hemoglobin: 11.9 g/dL — ABNORMAL LOW (ref 12.0–15.0)
MCH: 25.9 pg — ABNORMAL LOW (ref 26.0–34.0)
MCHC: 31.5 g/dL (ref 30.0–36.0)
MCV: 82.4 fL (ref 80.0–100.0)
Platelets: 414 10*3/uL — ABNORMAL HIGH (ref 150–400)
RBC: 4.59 MIL/uL (ref 3.87–5.11)
RDW: 16.8 % — ABNORMAL HIGH (ref 11.5–15.5)
WBC: 7.7 10*3/uL (ref 4.0–10.5)
nRBC: 0 % (ref 0.0–0.2)

## 2022-07-23 LAB — SURGICAL PCR SCREEN
MRSA, PCR: NEGATIVE
Staphylococcus aureus: NEGATIVE

## 2022-07-23 LAB — GLUCOSE, CAPILLARY: Glucose-Capillary: 86 mg/dL (ref 70–99)

## 2022-07-23 LAB — HEMOGLOBIN A1C
Hgb A1c MFr Bld: 5.8 % — ABNORMAL HIGH (ref 4.8–5.6)
Mean Plasma Glucose: 119.76 mg/dL

## 2022-07-24 NOTE — Anesthesia Preprocedure Evaluation (Deleted)
Anesthesia Evaluation    Airway        Dental   Pulmonary former smoker,           Cardiovascular hypertension,      Neuro/Psych    GI/Hepatic   Endo/Other  diabetes  Renal/GU      Musculoskeletal   Abdominal   Peds  Hematology   Anesthesia Other Findings   Reproductive/Obstetrics                             Anesthesia Physical Anesthesia Plan  ASA:   Anesthesia Plan:    Post-op Pain Management:    Induction:   PONV Risk Score and Plan:   Airway Management Planned:   Additional Equipment:   Intra-op Plan:   Post-operative Plan:   Informed Consent:   Plan Discussed with:   Anesthesia Plan Comments: (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  S/p T7-T11 laminectomy. Last seen by ID 06/29/2022. Per Dr. Tommy Medal, "From infectious standpoint I think she is clear for surgery though would like her to remain on antibiotics for her spine given how much she is gone through with surgery and given my concern about risk for recurrence despite the fact that she does not have hardware".  She has completed 6 weeks of cefazolin and is now on oral cefadroxil. See PAT note 07/23/2022.)        Anesthesia Quick Evaluation

## 2022-07-24 NOTE — Progress Notes (Signed)
Anesthesia Chart Review   Case: [id number] Date/Time: 08/03/22 0700   Procedure: RIGHT TOTAL HIP REVISION (Right: Hip)   Anesthesia type: Spinal   Pre-op diagnosis: RIGHT HIP REPLACEMENT PLOY WEAR   Location: [facility] ROOM 07 / [facility]   Surgeons: Frederik Pear, MD       DISCUSSION:76 y.o.m former smoker with h/o HTN, DM II, asthma, right hip replacement poly wear scheduled for above procedure 08/03/2022 with Dr. Frederik Pear.   History of C5-T1 cervical fusion.  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  S/p T7-T11 laminectomy. Last seen by ID 06/29/2022. Per Dr. Tommy Medal, "From infectious standpoint I think she is clear for surgery though would like her to remain on antibiotics for her spine given how much she is gone through with surgery and given my concern about risk for recurrence despite the fact that she does not have hardware".  She has completed 6 weeks of cefazolin and is now on oral cefadroxil.   Anticipate pt can proceed with planned procedure barring acute status change.   VS: BP 125/74   Pulse 100   Temp 36.9 C (Oral)   Resp 16   Ht _0  (1.422 m)   Wt 64 kg   SpO2 100%   BMI 31.61 kg/m   PROVIDERS: Merrilee Seashore, MD is PCP    LABS: Labs reviewed: Acceptable for surgery. (all labs ordered are listed, but only abnormal results are displayed)  Labs Reviewed  HEMOGLOBIN A1C - Abnormal; Notable for the following components:      Result Value   Hgb A1c MFr Bld 5.8 (*)    All other components within normal limits  BASIC METABOLIC PANEL - Abnormal; Notable for the following components:   Glucose, Bld 100 (*)    BUN 24 (*)    GFR, Estimated 60 (*)    All other components within normal limits  CBC - Abnormal; Notable for the following components:   Hemoglobin 11.9 (*)    MCH 25.9 (*)    RDW 16.8 (*)    Platelets 414 (*)    All other components within normal limits  SURGICAL PCR SCREEN  GLUCOSE, CAPILLARY  TYPE AND SCREEN      IMAGES:   EKG:   CV: Echo 01/27/2022  1. Left ventricular ejection fraction, by estimation, is 60 to 65%. Left  ventricular ejection fraction by PLAX is 63 %. The left ventricle has  normal function. The left ventricle has no regional wall motion  abnormalities. There is moderate asymmetric  left ventricular hypertrophy of the basal-septal segment. Left ventricular  diastolic parameters are consistent with Grade I diastolic dysfunction  (impaired relaxation). Elevated left ventricular end-diastolic pressure.  The E/e' is 60.   2. Right ventricular systolic function is normal. The right ventricular  size is normal. There is normal pulmonary artery systolic pressure. The  estimated right ventricular systolic pressure is 26.9 mmHg.   3. The mitral valve is grossly normal. Trivial mitral valve  regurgitation.   4. The aortic valve is tricuspid. Aortic valve regurgitation is not  visualized.   5. The inferior vena cava is normal in size with greater than 50%  respiratory variability, suggesting right atrial pressure of 3 mmHg.  Past Medical History:  Diagnosis Date   Anxiety    Arthritis    "knees, ankles, fingers" (07/06/2018)   Asthma    Chronic lower back pain    Dyspnea    with asthma attacks    Dysrhythmia  Family history of adverse reaction to anesthesia    my first cousin had difficulty waking up    Fibroid    ovarian   GERD (gastroesophageal reflux disease)    Hip osteoarthritis    Left   History of staph infection    in hospital for 11 days/ in 2007   Hyperlipidemia    Hypertension    Radiculopathy    Tremor 03/30/2022   Type II diabetes mellitus ([place])    Upper extremity weakness 03/30/2022   Wears glasses     Past Surgical History:  Procedure Laterality Date   ANTERIOR CERVICAL DECOMP/DISCECTOMY FUSION N/A 12/11/2015   Procedure: ANTERIOR CERVICAL DECOMPRESSION/DISCECTOMY FUSION 2 LEVELS;  Surgeon: Phylliss Bob, MD;  Location: [place];  Service:  Orthopedics;  Laterality: N/A;  Anterior cervical decompression fusion, cervical 6-7, cervical 7-thoracic 1 with instrumentation and allograft   BACK SURGERY     CARDIAC CATHETERIZATION  1998   CATARACT EXTRACTION W/ INTRAOCULAR LENS  IMPLANT, BILATERAL Bilateral 2015   [place]   COLONOSCOPY     CRYOABLATION  1988   "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"   INCISION AND DRAINAGE Bilateral 2007>   "cleaned staph out of shoulders"   IR FLUORO GUIDED NEEDLE PLC ASPIRATION/INJECTION LOC  01/27/2022   IR FLUORO GUIDED NEEDLE PLC ASPIRATION/INJECTION LOC  01/27/2022   JOINT REPLACEMENT     LAPAROSCOPIC OVARIAN CYSTECTOMY  1970   LUMBAR LAMINECTOMY/DECOMPRESSION MICRODISCECTOMY N/A 01/31/2018   Procedure: LAMINECTOMY AND FORAMINOTOMY LUMBAR TWO- LUMBAR THREE, LUMBAR THREE- LUMBAR FOUR, LUMBAR FOUR- LUMBAR FIVE ;  Surgeon: Newman Pies, MD;  Location: [place];  Service: Neurosurgery;  Laterality: N/A;   LUMBAR LAMINECTOMY/DECOMPRESSION MICRODISCECTOMY N/A 04/13/2022   Procedure: Thoracic seven,Thoracic eight, Thoracic nine, Thoracic ten LAMINECTOMY;  Surgeon: Newman Pies, MD;  Location: [place];  Service: Neurosurgery;  Laterality: N/A;   POSTERIOR CERVICAL LAMINECTOMY  ~ [place] Right 02/2003   THUMB FUSION Right 2010   right thumb   TONSILLECTOMY     TOTAL ABDOMINAL HYSTERECTOMY  1998   TAH,BSO   TOTAL HIP ARTHROPLASTY Right 05/18/2018   Procedure: TOTAL HIP ARTHROPLASTY ANTERIOR APPROACH;  Surgeon: Frederik Pear, MD;  Location: [place];  Service: Orthopedics;  Laterality: Right;   TOTAL HIP ARTHROPLASTY Left 07/06/2018   TOTAL HIP ARTHROPLASTY Left 07/06/2018   Procedure: LEFT TOTAL HIP ARTHROPLASTY ANTERIOR APPROACH;  Surgeon: Frederik Pear, MD;  Location: [place];  Service: Orthopedics;  Laterality: Left;  Needs RNFA   TUBAL LIGATION      MEDICATIONS:  albuterol (ACCUNEB) 1.25 MG/3ML nebulizer solution   albuterol (VENTOLIN HFA) 108 (90 Base)  MCG/ACT inhaler   Blood Glucose Monitoring Suppl (ONE TOUCH ULTRA 2) w/Device KIT   bumetanide (BUMEX) 1 MG tablet   cefadroxil (DURICEF) 500 MG capsule   cetirizine (ZYRTEC) 10 MG tablet   cholecalciferol (VITAMIN D3) 25 MCG (1000 UNIT) tablet   cyclobenzaprine (FLEXERIL) 5 MG tablet   diclofenac Sodium (VOLTAREN) 1 % GEL   diltiazem (CARDIZEM CD) 180 MG 24 hr capsule   enalapril (VASOTEC) 20 MG tablet   folic acid (FOLVITE) 947 MCG tablet   gabapentin (NEURONTIN) 300 MG capsule   glucose blood test strip   hydrOXYzine (ATARAX) 10 MG tablet   Lancets (ONETOUCH DELICA PLUS [id number]) MISC   lovastatin (MEVACOR) 40 MG tablet   meclizine (ANTIVERT) 12.5 MG tablet   metFORMIN (GLUCOPHAGE-XR) 500 MG 24 hr tablet   Multiple Vitamin (MULTIVITAMIN WITH  MINERALS) TABS tablet   omeprazole (PRILOSEC OTC) 20 MG tablet   ONETOUCH ULTRA test strip   oxyCODONE-acetaminophen (PERCOCET/ROXICET) 5-325 MG tablet   sodium chloride (OCEAN) 0.65 % SOLN nasal spray   TRELEGY ELLIPTA 200-62.5-25 MCG/ACT AEPB   No current facility-administered medications for this encounter.     Konrad Felix Ward, PA-C [facility] [phone]

## 2022-07-31 DIAGNOSIS — T84018A Broken internal joint prosthesis, other site, initial encounter: Principal | ICD-10-CM

## 2022-07-31 DIAGNOSIS — Z96649 Presence of unspecified artificial hip joint: Secondary | ICD-10-CM

## 2022-07-31 NOTE — H&P (Signed)
TOTAL HIP REVISION ADMISSION H&P  Patient is admitted for right revision total hip arthroplasty.  Subjective:  Chief Complaint: right hip pain  HPI: [name], 76 y.o. female, has a history of pain and functional disability in the right hip due to  worn liner  and patient has failed non-surgical conservative treatments for greater than 12 weeks to include NSAID's and/or analgesics, use of assistive devices, weight reduction as appropriate, and activity modification. The indications for the revision total hip arthroplasty are bearing surface wear leading to  implant or hip misalignment.  Onset of symptoms was gradual starting 2 years ago with gradually worsening course since that time.  Prior procedures on the right hip include arthroplasty.  Patient currently rates pain in the right hip at 10 out of 10 with activity.  There is night pain, worsening of pain with activity and weight bearing, trendelenberg gait, and pain that interfers with activities of daily living. Patient has evidence of joint space narrowing by imaging studies.  This condition presents safety issues increasing the risk of falls.   There is no current active infection.  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/02/2022   Gastroesophageal reflux disease 06/02/2022   Hardening of the aorta (main artery of the heart) ([place]) 06/02/2022   Iron deficiency anemia 06/02/2022   Major depressive disorder, single episode, unspecified 06/02/2022   Neuropathy 06/02/2022   Obesity 06/02/2022   Osteoarthritis 06/02/2022   Osteopenia 06/02/2022   Polyneuropathy due to type 2 diabetes mellitus ([place]) 06/02/2022   Tobacco user 06/02/2022   Osteomyelitis of thoracic region ([place]) 04/13/2022   Upper extremity weakness 03/30/2022   Tremor 03/30/2022   PICC (peripherally inserted central catheter) in place 02/20/2022    Vertebral osteomyelitis ([place]) 02/20/2022   Medication monitoring encounter    Epidural abscess 01/28/2022   Acute urinary retention 01/28/2022   Hypokalemia 01/28/2022   Rheumatoid arthritis ([place]) 01/27/2022   Hyperlipidemia 01/27/2022   Immunocompromised ([place])    Sepsis ([place]) discitis/osteomyelitis, epidural phlegmon 01/26/2022   Discitis 01/26/2022   Centrilobular emphysema ([place]) 11/04/2021   Chronic kidney disease, stage 2 (mild) 11/04/2021   Acute exacerbation of chronic obstructive airways disease ([place]) 11/04/2021   Primary osteoarthritis of left hip 07/06/2018   Osteoarthritis of left hip 05/18/2018   Osteoarthritis of right hip 05/17/2018   Lumbar stenosis with neurogenic claudication 01/31/2018   HTN (hypertension) 09/03/2016   Diabetes ([place]) 09/03/2016   Radiculopathy 12/11/2015   Past Medical History:  Diagnosis Date   Anxiety    Arthritis    "knees, ankles, fingers" (07/06/2018)   Asthma    Chronic lower back pain    Dyspnea    with asthma attacks    Dysrhythmia    Family history of adverse reaction to anesthesia    my first cousin had difficulty waking up    Fibroid    ovarian   GERD (gastroesophageal reflux disease)    Hip osteoarthritis    Left   History of staph infection    in hospital for 11 days/ in 2007   Hyperlipidemia    Hypertension    Radiculopathy    Tremor 03/30/2022   Type II diabetes mellitus ([place])    Upper extremity weakness 03/30/2022   Wears glasses     Past Surgical History:  Procedure Laterality Date   ANTERIOR CERVICAL DECOMP/DISCECTOMY FUSION N/A 12/11/2015  Procedure: ANTERIOR CERVICAL DECOMPRESSION/DISCECTOMY FUSION 2 LEVELS;  Surgeon: Phylliss Bob, MD;  Location: [facility];  Service: Orthopedics;  Laterality: N/A;  Anterior cervical decompression fusion, cervical 6-7, cervical 7-thoracic 1 with instrumentation and allograft   BACK SURGERY     CARDIAC CATHETERIZATION  1998   CATARACT EXTRACTION W/ INTRAOCULAR LENS  IMPLANT,  BILATERAL Bilateral 2015   Bil   COLONOSCOPY     CRYOABLATION  1988   "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"   INCISION AND DRAINAGE Bilateral 2007>   "cleaned staph out of shoulders"   IR FLUORO GUIDED NEEDLE PLC ASPIRATION/INJECTION LOC  01/27/2022   IR FLUORO GUIDED NEEDLE PLC ASPIRATION/INJECTION LOC  01/27/2022   JOINT REPLACEMENT     LAPAROSCOPIC OVARIAN CYSTECTOMY  1970   LUMBAR LAMINECTOMY/DECOMPRESSION MICRODISCECTOMY N/A 01/31/2018   Procedure: LAMINECTOMY AND FORAMINOTOMY LUMBAR TWO- LUMBAR THREE, LUMBAR THREE- LUMBAR FOUR, LUMBAR FOUR- LUMBAR FIVE ;  Surgeon: Newman Pies, MD;  Location: [facility];  Service: Neurosurgery;  Laterality: N/A;   LUMBAR LAMINECTOMY/DECOMPRESSION MICRODISCECTOMY N/A 04/13/2022   Procedure: Thoracic seven,Thoracic eight, Thoracic nine, Thoracic ten LAMINECTOMY;  Surgeon: Newman Pies, MD;  Location: [facility];  Service: Neurosurgery;  Laterality: N/A;   POSTERIOR CERVICAL LAMINECTOMY  ~ [place] Right 02/2003   THUMB FUSION Right 2010   right thumb   TONSILLECTOMY     TOTAL ABDOMINAL HYSTERECTOMY  1998   TAH,BSO   TOTAL HIP ARTHROPLASTY Right 05/18/2018   Procedure: TOTAL HIP ARTHROPLASTY ANTERIOR APPROACH;  Surgeon: Frederik Pear, MD;  Location: [facility];  Service: Orthopedics;  Laterality: Right;   TOTAL HIP ARTHROPLASTY Left 07/06/2018   TOTAL HIP ARTHROPLASTY Left 07/06/2018   Procedure: LEFT TOTAL HIP ARTHROPLASTY ANTERIOR APPROACH;  Surgeon: Frederik Pear, MD;  Location: [facility];  Service: Orthopedics;  Laterality: Left;  Needs RNFA   TUBAL LIGATION      No current facility-administered medications for this encounter.   Current Outpatient Medications  Medication Sig Dispense Refill Last Dose   albuterol (ACCUNEB) 1.25 MG/3ML nebulizer solution Take 1 ampule by nebulization every 4 (four) hours as needed for wheezing.      albuterol (VENTOLIN HFA) 108 (90 Base) MCG/ACT inhaler Inhale 2 puffs into the  lungs every 6 (six) hours as needed for wheezing or shortness of breath.      bumetanide (BUMEX) 1 MG tablet Take 1 mg by mouth 2 (two) times daily.      cefadroxil (DURICEF) 500 MG capsule Take 2 capsules (1,000 mg total) by mouth 2 (two) times daily. 360 capsule 1    cetirizine (ZYRTEC) 10 MG tablet Take 10 mg by mouth daily.      cholecalciferol (VITAMIN D3) 25 MCG (1000 UNIT) tablet Take 1,000 Units by mouth daily.      diclofenac Sodium (VOLTAREN) 1 % GEL Apply 4 g topically 4 (four) times daily. (Patient taking differently: Apply 4 g topically daily as needed (pain).) 100 g 0    diltiazem (CARDIZEM CD) 180 MG 24 hr capsule Take 180 mg by mouth daily.      enalapril (VASOTEC) 20 MG tablet Take 20 mg by mouth 2 (two) times daily.      folic acid (FOLVITE) 267 MCG tablet Take 800 mcg by mouth daily.      gabapentin (NEURONTIN) 300 MG capsule Take 300 mg by mouth at bedtime.      hydrOXYzine (ATARAX) 10 MG tablet Take 10-20 mg by mouth 2 (two) times  daily as needed for anxiety.      lovastatin (MEVACOR) 40 MG tablet Take 40 mg by mouth at bedtime.       meclizine (ANTIVERT) 12.5 MG tablet Take 12.5 mg by mouth 2 (two) times daily as needed for dizziness.      metFORMIN (GLUCOPHAGE-XR) 500 MG 24 hr tablet Take 500 mg by mouth in the morning and at bedtime.      Multiple Vitamin (MULTIVITAMIN WITH MINERALS) TABS tablet Take 1 tablet by mouth daily.      omeprazole (PRILOSEC OTC) 20 MG tablet Take 20 mg by mouth daily as needed (heartburn).      sodium chloride (OCEAN) 0.65 % SOLN nasal spray Place 1-2 sprays into the nose 4 (four) times daily as needed for congestion.      TRELEGY ELLIPTA 200-62.5-25 MCG/ACT AEPB Take 1 puff by mouth daily.      Blood Glucose Monitoring Suppl (ONE TOUCH ULTRA 2) w/Device KIT Check blood sugar twice per week      cyclobenzaprine (FLEXERIL) 5 MG tablet Take 1 tablet (5 mg total) by mouth 3 (three) times daily as needed for muscle spasms. (Patient not taking:  Reported on 07/20/2022) 30 tablet 0 Not Taking   glucose blood test strip       Lancets (ONETOUCH DELICA PLUS [id number]) MISC Use to check blood sugar      ONETOUCH ULTRA test strip       oxyCODONE-acetaminophen (PERCOCET/ROXICET) 5-325 MG tablet Take 1-2 tablets by mouth every 4 (four) hours as needed for moderate pain. (Patient not taking: Reported on 07/20/2022) 30 tablet 0 Not Taking   Allergies  Allergen Reactions   Ivp Dye [Iodinated Contrast Media] Nausea And Vomiting   Prednisone Other (See Comments)    reflux    Social History   Tobacco Use   Smoking status: Former    Packs/day: 1.00    Years: 25.00    Total pack years: 25.00    Types: Cigarettes    Quit date: 11/23/1992    Years since quitting: 29.7   Smokeless tobacco: Never  Substance Use Topics   Alcohol use: Not Currently    Family History  Problem Relation Age of Onset   Diabetes Sister    Diabetes Brother    Diabetes Brother    Diabetes Paternal Uncle    Cancer Paternal Aunt        UTERINE   Hypertension Maternal Grandmother    Heart disease Maternal Grandmother    Colon cancer Neg Hx       Review of Systems  Constitutional: Negative.   HENT: Negative.    Eyes: Negative.   Respiratory: Negative.    Cardiovascular: Negative.   Gastrointestinal: Negative.   Endocrine: Negative.   Genitourinary: Negative.   Musculoskeletal:  Positive for arthralgias.  Allergic/Immunologic: Negative.   Neurological: Negative.   Hematological: Negative.   Psychiatric/Behavioral: Negative.      Objective:  Physical Exam Constitutional:      Appearance: Normal appearance. She is normal weight.  HENT:     Head: Normocephalic and atraumatic.     Nose: Nose normal.  Eyes:     Pupils: Pupils are equal, round, and reactive to light.  Cardiovascular:     Pulses: Normal pulses.  Pulmonary:     Effort: Pulmonary effort is normal.  Musculoskeletal:     Cervical back: Normal range of motion and neck supple.      Comments: Surgical scars to both hips are well-healed internal and  external rotation is 40 each, foot taps are negative.  In the supine position she can straight leg raise without difficulty logroll causes no discomfort good power to testing of flexors abductors abductors and extensors.  Neurologically intact distally.  Both knees come to full extension, and flex 140.  Good power to testing the quadriceps and hamstring musculature.  Skin:    General: Skin is warm and dry.  Neurological:     General: No focal deficit present.     Mental Status: She is alert and oriented to person, place, and time. Mental status is at baseline.  Psychiatric:        Mood and Affect: Mood normal.        Behavior: Behavior normal.        Thought Content: Thought content normal.        Judgment: Judgment normal.     Vital signs in last 24 hours:     Labs:   Estimated body mass index is 31.61 kg/m as calculated from the following:   Height as of 07/23/22: '4\' 8"'  (1.422 m).   Weight as of 07/23/22: 64 kg.  Imaging Review:  Plain radiographs demonstrate  AP Rosenberg lateral and sunrise x-rays show mild arthritic changes to both knees maintenance of the least 60-70% of the articular cartilage medially and laterally.  No subluxation.  AP pelvis and crosstable lateral both hips on the left normal on the right the patient is worn through 2-3 mm of the polyethylene of the right hip.  Anteversion of the left hip is 56 and eversion of the right hip 77 although should be noticed the pelvis itself is anteverted a great deal with very open obturator for a months.    Assessment/Plan:  End stage arthritis, right hip(s) with failed previous arthroplasty.  The patient history, physical examination, clinical judgement of the provider and imaging studies are consistent with end stage degenerative joint disease of the right hip(s), previous total hip arthroplasty. Revision total hip arthroplasty is deemed medically  necessary. The treatment options including medical management, injection therapy, arthroscopy and arthroplasty were discussed at length. The risks and benefits of total hip arthroplasty were presented and reviewed. The risks due to aseptic loosening, infection, stiffness, dislocation/subluxation,  thromboembolic complications and other imponderables were discussed.  The patient acknowledged the explanation, agreed to proceed with the plan and consent was signed. Patient is being admitted for inpatient treatment for surgery, pain control, PT, OT, prophylactic antibiotics, VTE prophylaxis, progressive ambulation and ADL's and discharge planning. The patient is planning to be discharged home with home health services

## 2022-08-02 NOTE — Anesthesia Preprocedure Evaluation (Signed)
Anesthesia Evaluation  Patient identified by MRN, date of birth, ID band Patient awake    Reviewed: Allergy & Precautions, NPO status , Patient's Chart, lab work & pertinent test results  History of Anesthesia Complications Negative for: history of anesthetic complications  Airway Mallampati: II  TM Distance: >3 FB Neck ROM: Full    Dental no notable dental hx. (+) Dental Advisory Given,    Pulmonary asthma , COPD, former smoker,    Pulmonary exam normal        Cardiovascular hypertension, Pt. on medications Normal cardiovascular exam+ dysrhythmias   Echo 3/23 IMPRESSIONS    1. Left ventricular ejection fraction, by estimation, is 60 to 65%. Left  ventricular ejection fraction by PLAX is 63 %. The left ventricle has  normal function. The left ventricle has no regional wall motion  abnormalities. There is moderate asymmetric  left ventricular hypertrophy of the basal-septal segment. Left ventricular  diastolic parameters are consistent with Grade I diastolic dysfunction  (impaired relaxation). Elevated left ventricular end-diastolic pressure.  The E/e' is 76.  2. Right ventricular systolic function is normal. The right ventricular  size is normal. There is normal pulmonary artery systolic pressure. The  estimated right ventricular systolic pressure is 70.4 mmHg.  3. The mitral valve is grossly normal. Trivial mitral valve  regurgitation.  4. The aortic valve is tricuspid. Aortic valve regurgitation is not  visualized.  5. The inferior vena cava is normal in size with greater than 50%  respiratory variability, suggesting right atrial pressure of 3 mmHg.    Neuro/Psych Anxiety Depression negative neurological ROS  negative psych ROS   GI/Hepatic Neg liver ROS, GERD  Medicated,  Endo/Other  diabetes, Type 2, Oral Hypoglycemic Agents  Renal/GU   negative genitourinary   Musculoskeletal  (+) Arthritis ,  Osteoarthritis,  Osteomyelitis thoracic spine   Abdominal   Peds  Hematology negative hematology ROS (+)   Anesthesia Other Findings   Reproductive/Obstetrics                            Anesthesia Physical  Anesthesia Plan  ASA: 3  Anesthesia Plan: General   Post-op Pain Management: Celebrex PO (pre-op)* and Tylenol PO (pre-op)*   Induction: Intravenous  PONV Risk Score and Plan: 3 and Ondansetron, Dexamethasone, Treatment may vary due to age or medical condition and Midazolam  Airway Management Planned: Oral ETT  Additional Equipment: None  Intra-op Plan:   Post-operative Plan: Extubation in OR  Informed Consent: I have reviewed the patients History and Physical, chart, labs and discussed the procedure including the risks, benefits and alternatives for the proposed anesthesia with the patient or authorized representative who has indicated his/her understanding and acceptance.     Dental advisory given  Plan Discussed with: Anesthesiologist and CRNA  Anesthesia Plan Comments: ( )       Anesthesia Quick Evaluation

## 2022-08-03 ENCOUNTER — Other Ambulatory Visit: Payer: Self-pay

## 2022-08-03 ENCOUNTER — Inpatient Hospital Stay (HOSPITAL_COMMUNITY): Payer: HMO

## 2022-08-03 ENCOUNTER — Inpatient Hospital Stay (HOSPITAL_COMMUNITY): Payer: HMO | Admitting: Physician Assistant

## 2022-08-03 ENCOUNTER — Encounter (HOSPITAL_COMMUNITY)
Admission: RE | Disposition: A | Discharge status: Home / Self Care | Payer: Self-pay | Source: Home / Self Care | Attending: Orthopedic Surgery

## 2022-08-03 ENCOUNTER — Inpatient Hospital Stay (HOSPITAL_BASED_OUTPATIENT_CLINIC_OR_DEPARTMENT_OTHER): Payer: HMO | Admitting: Physician Assistant

## 2022-08-03 ENCOUNTER — Inpatient Hospital Stay (HOSPITAL_COMMUNITY)
Admission: RE | Admit: 2022-08-03 | Discharge: 2022-08-04 | DRG: 467 | Disposition: A | Discharge status: Home / Self Care | Payer: HMO | Attending: Orthopedic Surgery | Admitting: Orthopedic Surgery

## 2022-08-03 ENCOUNTER — Encounter (HOSPITAL_COMMUNITY): Payer: Self-pay | Admitting: Orthopedic Surgery

## 2022-08-03 DIAGNOSIS — M48062 Spinal stenosis, lumbar region with neurogenic claudication: Secondary | ICD-10-CM | POA: Diagnosis present

## 2022-08-03 DIAGNOSIS — M19042 Primary osteoarthritis, left hand: Secondary | ICD-10-CM | POA: Diagnosis present

## 2022-08-03 DIAGNOSIS — Z471 Aftercare following joint replacement surgery: Secondary | ICD-10-CM | POA: Diagnosis not present

## 2022-08-03 DIAGNOSIS — Z96649 Presence of unspecified artificial hip joint: Secondary | ICD-10-CM

## 2022-08-03 DIAGNOSIS — F418 Other specified anxiety disorders: Secondary | ICD-10-CM | POA: Diagnosis not present

## 2022-08-03 DIAGNOSIS — Z96611 Presence of right artificial shoulder joint: Secondary | ICD-10-CM | POA: Diagnosis present

## 2022-08-03 DIAGNOSIS — F419 Anxiety disorder, unspecified: Secondary | ICD-10-CM | POA: Diagnosis not present

## 2022-08-03 DIAGNOSIS — Z808 Family history of malignant neoplasm of other organs or systems: Secondary | ICD-10-CM

## 2022-08-03 DIAGNOSIS — T84090A Other mechanical complication of internal right hip prosthesis, initial encounter: Secondary | ICD-10-CM | POA: Diagnosis not present

## 2022-08-03 DIAGNOSIS — T84060A Wear of articular bearing surface of internal prosthetic right hip joint, initial encounter: Secondary | ICD-10-CM | POA: Diagnosis present

## 2022-08-03 DIAGNOSIS — E1122 Type 2 diabetes mellitus with diabetic chronic kidney disease: Secondary | ICD-10-CM | POA: Diagnosis not present

## 2022-08-03 DIAGNOSIS — E669 Obesity, unspecified: Secondary | ICD-10-CM | POA: Diagnosis present

## 2022-08-03 DIAGNOSIS — N182 Chronic kidney disease, stage 2 (mild): Secondary | ICD-10-CM | POA: Diagnosis not present

## 2022-08-03 DIAGNOSIS — E1142 Type 2 diabetes mellitus with diabetic polyneuropathy: Secondary | ICD-10-CM | POA: Diagnosis present

## 2022-08-03 DIAGNOSIS — T84030A Mechanical loosening of internal right hip prosthetic joint, initial encounter: Secondary | ICD-10-CM | POA: Diagnosis not present

## 2022-08-03 DIAGNOSIS — Y792 Prosthetic and other implants, materials and accessory orthopedic devices associated with adverse incidents: Secondary | ICD-10-CM | POA: Diagnosis present

## 2022-08-03 DIAGNOSIS — T84018A Broken internal joint prosthesis, other site, initial encounter: Principal | ICD-10-CM

## 2022-08-03 DIAGNOSIS — M19071 Primary osteoarthritis, right ankle and foot: Secondary | ICD-10-CM | POA: Diagnosis not present

## 2022-08-03 DIAGNOSIS — F329 Major depressive disorder, single episode, unspecified: Secondary | ICD-10-CM | POA: Diagnosis not present

## 2022-08-03 DIAGNOSIS — Z7982 Long term (current) use of aspirin: Secondary | ICD-10-CM

## 2022-08-03 DIAGNOSIS — M19041 Primary osteoarthritis, right hand: Secondary | ICD-10-CM | POA: Diagnosis present

## 2022-08-03 DIAGNOSIS — Z7951 Long term (current) use of inhaled steroids: Secondary | ICD-10-CM

## 2022-08-03 DIAGNOSIS — Z9181 History of falling: Secondary | ICD-10-CM

## 2022-08-03 DIAGNOSIS — M19072 Primary osteoarthritis, left ankle and foot: Secondary | ICD-10-CM | POA: Diagnosis present

## 2022-08-03 DIAGNOSIS — M8580 Other specified disorders of bone density and structure, unspecified site: Secondary | ICD-10-CM | POA: Diagnosis present

## 2022-08-03 DIAGNOSIS — E785 Hyperlipidemia, unspecified: Secondary | ICD-10-CM | POA: Diagnosis present

## 2022-08-03 DIAGNOSIS — Z90710 Acquired absence of both cervix and uterus: Secondary | ICD-10-CM

## 2022-08-03 DIAGNOSIS — Z9851 Tubal ligation status: Secondary | ICD-10-CM

## 2022-08-03 DIAGNOSIS — Z7984 Long term (current) use of oral hypoglycemic drugs: Secondary | ICD-10-CM

## 2022-08-03 DIAGNOSIS — Z01818 Encounter for other preprocedural examination: Secondary | ICD-10-CM | POA: Diagnosis not present

## 2022-08-03 DIAGNOSIS — Z981 Arthrodesis status: Secondary | ICD-10-CM

## 2022-08-03 DIAGNOSIS — D849 Immunodeficiency, unspecified: Secondary | ICD-10-CM | POA: Diagnosis not present

## 2022-08-03 DIAGNOSIS — Z23 Encounter for immunization: Secondary | ICD-10-CM

## 2022-08-03 DIAGNOSIS — Z96643 Presence of artificial hip joint, bilateral: Secondary | ICD-10-CM | POA: Diagnosis present

## 2022-08-03 DIAGNOSIS — D509 Iron deficiency anemia, unspecified: Secondary | ICD-10-CM | POA: Diagnosis not present

## 2022-08-03 DIAGNOSIS — M069 Rheumatoid arthritis, unspecified: Secondary | ICD-10-CM | POA: Diagnosis present

## 2022-08-03 DIAGNOSIS — I129 Hypertensive chronic kidney disease with stage 1 through stage 4 chronic kidney disease, or unspecified chronic kidney disease: Secondary | ICD-10-CM | POA: Diagnosis not present

## 2022-08-03 DIAGNOSIS — Z833 Family history of diabetes mellitus: Secondary | ICD-10-CM

## 2022-08-03 DIAGNOSIS — D62 Acute posthemorrhagic anemia: Secondary | ICD-10-CM | POA: Diagnosis not present

## 2022-08-03 DIAGNOSIS — Z79899 Other long term (current) drug therapy: Secondary | ICD-10-CM

## 2022-08-03 DIAGNOSIS — Z87891 Personal history of nicotine dependence: Secondary | ICD-10-CM | POA: Diagnosis not present

## 2022-08-03 DIAGNOSIS — Z888 Allergy status to other drugs, medicaments and biological substances status: Secondary | ICD-10-CM

## 2022-08-03 DIAGNOSIS — M170 Bilateral primary osteoarthritis of knee: Secondary | ICD-10-CM | POA: Diagnosis not present

## 2022-08-03 DIAGNOSIS — K219 Gastro-esophageal reflux disease without esophagitis: Secondary | ICD-10-CM | POA: Diagnosis present

## 2022-08-03 DIAGNOSIS — I10 Essential (primary) hypertension: Secondary | ICD-10-CM

## 2022-08-03 DIAGNOSIS — Z8619 Personal history of other infectious and parasitic diseases: Secondary | ICD-10-CM

## 2022-08-03 DIAGNOSIS — Z91041 Radiographic dye allergy status: Secondary | ICD-10-CM

## 2022-08-03 DIAGNOSIS — Z8249 Family history of ischemic heart disease and other diseases of the circulatory system: Secondary | ICD-10-CM

## 2022-08-03 DIAGNOSIS — J432 Centrilobular emphysema: Secondary | ICD-10-CM | POA: Diagnosis present

## 2022-08-03 DIAGNOSIS — M4624 Osteomyelitis of vertebra, thoracic region: Secondary | ICD-10-CM | POA: Diagnosis not present

## 2022-08-03 DIAGNOSIS — T84020A Dislocation of internal right hip prosthesis, initial encounter: Secondary | ICD-10-CM | POA: Diagnosis not present

## 2022-08-03 DIAGNOSIS — Z6831 Body mass index (BMI) 31.0-31.9, adult: Secondary | ICD-10-CM

## 2022-08-03 DIAGNOSIS — J449 Chronic obstructive pulmonary disease, unspecified: Secondary | ICD-10-CM | POA: Diagnosis not present

## 2022-08-03 DIAGNOSIS — M160 Bilateral primary osteoarthritis of hip: Secondary | ICD-10-CM | POA: Diagnosis present

## 2022-08-03 DIAGNOSIS — G8929 Other chronic pain: Secondary | ICD-10-CM | POA: Diagnosis present

## 2022-08-03 DIAGNOSIS — M21969 Unspecified acquired deformity of unspecified lower leg: Secondary | ICD-10-CM | POA: Diagnosis present

## 2022-08-03 DIAGNOSIS — R251 Tremor, unspecified: Secondary | ICD-10-CM | POA: Diagnosis present

## 2022-08-03 DIAGNOSIS — Z96641 Presence of right artificial hip joint: Secondary | ICD-10-CM | POA: Diagnosis not present

## 2022-08-03 HISTORY — PX: TOTAL HIP REVISION: SHX763

## 2022-08-03 LAB — GLUCOSE, CAPILLARY
Glucose-Capillary: 83 mg/dL (ref 70–99)
Glucose-Capillary: 122 mg/dL — ABNORMAL HIGH (ref 70–99)
Glucose-Capillary: 118 mg/dL — ABNORMAL HIGH (ref 70–99)
Glucose-Capillary: 192 mg/dL — ABNORMAL HIGH (ref 70–99)
Glucose-Capillary: 157 mg/dL — ABNORMAL HIGH (ref 70–99)

## 2022-08-03 SURGERY — TOTAL HIP REVISION
Anesthesia: General | Site: Hip | Laterality: Right

## 2022-08-03 MED ORDER — ONDANSETRON HCL 4 MG/2ML IJ SOLN
INTRAMUSCULAR | Status: AC
Start: 2022-08-03 — End: ?
  Filled 2022-08-03: qty 2

## 2022-08-03 MED ORDER — ACETAMINOPHEN 500 MG PO TABS
1000.0000 mg | ORAL_TABLET | Freq: Once | ORAL | Status: AC
  Administered 2022-08-03: 1000 mg via ORAL
  Filled 2022-08-03: qty 2

## 2022-08-03 MED ORDER — 0.9 % SODIUM CHLORIDE (POUR BTL) OPTIME
TOPICAL | Status: DC | PRN
  Administered 2022-08-03: 1000 mL

## 2022-08-03 MED ORDER — METOCLOPRAMIDE HCL 5 MG PO TABS: 5.0000 mg | ORAL_TABLET | Freq: Three times a day (TID) | ORAL | Status: DC | PRN

## 2022-08-03 MED ORDER — BUPIVACAINE-EPINEPHRINE (PF) 0.25% -1:200000 IJ SOLN
INTRAMUSCULAR | Status: AC
Start: 2022-08-03 — End: ?
  Filled 2022-08-03: qty 30

## 2022-08-03 MED ORDER — SODIUM CHLORIDE (PF) 0.9 % IJ SOLN
INTRAMUSCULAR | Status: AC
  Filled 2022-08-03: qty 30

## 2022-08-03 MED ORDER — BISACODYL 5 MG PO TBEC: 5.0000 mg | DELAYED_RELEASE_TABLET | Freq: Every day | ORAL | Status: DC | PRN

## 2022-08-03 MED ORDER — ALUM & MAG HYDROXIDE-SIMETH 200-200-20 MG/5ML PO SUSP: 30.0000 mL | ORAL | Status: DC | PRN

## 2022-08-03 MED ORDER — HYDROMORPHONE HCL 2 MG PO TABS: 2.0000 mg | ORAL_TABLET | ORAL | Status: DC | PRN

## 2022-08-03 MED ORDER — PHENYLEPHRINE 80 MCG/ML (10ML) SYRINGE FOR IV PUSH (FOR BLOOD PRESSURE SUPPORT)
PREFILLED_SYRINGE | INTRAVENOUS | Status: AC
Start: 2022-08-03 — End: ?
  Filled 2022-08-03: qty 10

## 2022-08-03 MED ORDER — LIDOCAINE HCL (PF) 2 % IJ SOLN
INTRAMUSCULAR | Status: AC
Start: 2022-08-03 — End: ?
  Filled 2022-08-03: qty 5

## 2022-08-03 MED ORDER — ROCURONIUM BROMIDE 10 MG/ML (PF) SYRINGE
PREFILLED_SYRINGE | INTRAVENOUS | Status: AC
Start: 2022-08-03 — End: ?
  Filled 2022-08-03: qty 10

## 2022-08-03 MED ORDER — INFLUENZA VAC A&B SA ADJ QUAD 0.5 ML IM PRSY
0.5000 mL | PREFILLED_SYRINGE | INTRAMUSCULAR | Status: DC
  Filled 2022-08-03: qty 0.5

## 2022-08-03 MED ORDER — PROPOFOL 10 MG/ML IV BOLUS
INTRAVENOUS | Status: AC
  Filled 2022-08-03: qty 20

## 2022-08-03 MED ORDER — DEXAMETHASONE SODIUM PHOSPHATE 10 MG/ML IJ SOLN
10.0000 mg | Freq: Once | INTRAMUSCULAR | Status: AC
  Administered 2022-08-04: 10 mg via INTRAVENOUS
  Filled 2022-08-03: qty 1

## 2022-08-03 MED ORDER — FENTANYL CITRATE PF 50 MCG/ML IJ SOSY
25.0000 ug | PREFILLED_SYRINGE | INTRAMUSCULAR | Status: DC | PRN
  Administered 2022-08-03 (×3): 50 ug via INTRAVENOUS

## 2022-08-03 MED ORDER — DEXAMETHASONE SODIUM PHOSPHATE 10 MG/ML IJ SOLN
INTRAMUSCULAR | Status: AC
Start: 2022-08-03 — End: ?
  Filled 2022-08-03: qty 1

## 2022-08-03 MED ORDER — LACTATED RINGERS IV SOLN
INTRAVENOUS | Status: DC
  Administered 2022-08-03: 1000 mL via INTRAVENOUS

## 2022-08-03 MED ORDER — CEFAZOLIN SODIUM-DEXTROSE 1-4 GM/50ML-% IV SOLN
1.0000 g | Freq: Four times a day (QID) | INTRAVENOUS | Status: AC
  Administered 2022-08-03 (×2): 1 g via INTRAVENOUS
  Filled 2022-08-03 (×2): qty 50

## 2022-08-03 MED ORDER — METHOCARBAMOL 500 MG IVPB - SIMPLE MED
INTRAVENOUS | Status: AC
  Filled 2022-08-03: qty 55

## 2022-08-03 MED ORDER — OXYCODONE-ACETAMINOPHEN 5-325 MG PO TABS: 1.0000 | ORAL_TABLET | ORAL | 0 refills | Status: DC | PRN

## 2022-08-03 MED ORDER — CEFAZOLIN SODIUM-DEXTROSE 2-4 GM/100ML-% IV SOLN
2.0000 g | INTRAVENOUS | Status: AC
  Administered 2022-08-03: 2 g via INTRAVENOUS
  Filled 2022-08-03: qty 100

## 2022-08-03 MED ORDER — ONDANSETRON HCL 4 MG PO TABS: 4.0000 mg | ORAL_TABLET | Freq: Four times a day (QID) | ORAL | Status: DC | PRN

## 2022-08-03 MED ORDER — ALBUTEROL SULFATE HFA 108 (90 BASE) MCG/ACT IN AERS: 2.0000 | INHALATION_SPRAY | Freq: Four times a day (QID) | RESPIRATORY_TRACT | Status: DC | PRN

## 2022-08-03 MED ORDER — MECLIZINE HCL 25 MG PO TABS: 12.5000 mg | ORAL_TABLET | Freq: Two times a day (BID) | ORAL | Status: DC | PRN

## 2022-08-03 MED ORDER — PROMETHAZINE HCL 25 MG/ML IJ SOLN: 6.2500 mg | INTRAMUSCULAR | Status: DC | PRN

## 2022-08-03 MED ORDER — ACETAMINOPHEN 500 MG PO TABS
1000.0000 mg | ORAL_TABLET | Freq: Four times a day (QID) | ORAL | Status: AC
  Administered 2022-08-03 (×3): 1000 mg via ORAL
  Filled 2022-08-03 (×4): qty 2

## 2022-08-03 MED ORDER — FENTANYL CITRATE (PF) 100 MCG/2ML IJ SOLN
INTRAMUSCULAR | Status: AC
  Filled 2022-08-03: qty 2

## 2022-08-03 MED ORDER — TRANEXAMIC ACID-NACL 1000-0.7 MG/100ML-% IV SOLN
1000.0000 mg | INTRAVENOUS | Status: AC
  Administered 2022-08-03: 1000 mg via INTRAVENOUS
  Filled 2022-08-03: qty 100

## 2022-08-03 MED ORDER — ROCURONIUM BROMIDE 10 MG/ML (PF) SYRINGE
PREFILLED_SYRINGE | INTRAVENOUS | Status: DC | PRN
  Administered 2022-08-03: 60 mg via INTRAVENOUS

## 2022-08-03 MED ORDER — FLEET ENEMA 7-19 GM/118ML RE ENEM: 1.0000 | ENEMA | Freq: Once | RECTAL | Status: DC | PRN

## 2022-08-03 MED ORDER — TRANEXAMIC ACID 1000 MG/10ML IV SOLN
INTRAVENOUS | Status: DC | PRN
  Administered 2022-08-03: 2000 mg via TOPICAL

## 2022-08-03 MED ORDER — ACETAMINOPHEN 325 MG PO TABS: 325.0000 mg | ORAL_TABLET | Freq: Four times a day (QID) | ORAL | Status: DC | PRN

## 2022-08-03 MED ORDER — ENALAPRIL MALEATE 10 MG PO TABS
20.0000 mg | ORAL_TABLET | Freq: Two times a day (BID) | ORAL | Status: DC
  Administered 2022-08-04: 20 mg via ORAL
  Filled 2022-08-03 (×2): qty 2

## 2022-08-03 MED ORDER — UMECLIDINIUM BROMIDE 62.5 MCG/ACT IN AEPB
1.0000 | INHALATION_SPRAY | Freq: Every day | RESPIRATORY_TRACT | Status: DC
  Administered 2022-08-04: 1 via RESPIRATORY_TRACT
  Filled 2022-08-03: qty 7

## 2022-08-03 MED ORDER — INSULIN ASPART 100 UNIT/ML IJ SOLN
0.0000 [IU] | Freq: Three times a day (TID) | INTRAMUSCULAR | Status: DC
  Administered 2022-08-03 – 2022-08-04 (×2): 3 [IU] via SUBCUTANEOUS
  Administered 2022-08-04: 2 [IU] via SUBCUTANEOUS

## 2022-08-03 MED ORDER — CHLORHEXIDINE GLUCONATE 0.12 % MT SOLN
15.0000 mL | Freq: Once | OROMUCOSAL | Status: AC
Start: 2022-08-03 — End: 2022-08-03
  Administered 2022-08-03: 15 mL via OROMUCOSAL

## 2022-08-03 MED ORDER — POLYETHYLENE GLYCOL 3350 17 G PO PACK: 17.0000 g | PACK | Freq: Every day | ORAL | Status: DC | PRN

## 2022-08-03 MED ORDER — KCL IN DEXTROSE-NACL 20-5-0.45 MEQ/L-%-% IV SOLN
INTRAVENOUS | Status: DC
  Filled 2022-08-03 (×3): qty 1000

## 2022-08-03 MED ORDER — PROPOFOL 10 MG/ML IV BOLUS
INTRAVENOUS | Status: DC | PRN
  Administered 2022-08-03: 110 mg via INTRAVENOUS

## 2022-08-03 MED ORDER — LORATADINE 10 MG PO TABS
10.0000 mg | ORAL_TABLET | Freq: Every day | ORAL | Status: DC
  Administered 2022-08-04: 10 mg via ORAL
  Filled 2022-08-03: qty 1

## 2022-08-03 MED ORDER — HYDROMORPHONE HCL 1 MG/ML IJ SOLN: 0.5000 mg | INTRAMUSCULAR | Status: DC | PRN

## 2022-08-03 MED ORDER — FLUTICASONE FUROATE-VILANTEROL 200-25 MCG/ACT IN AEPB
1.0000 | INHALATION_SPRAY | Freq: Every day | RESPIRATORY_TRACT | Status: DC
  Administered 2022-08-04: 1 via RESPIRATORY_TRACT
  Filled 2022-08-03: qty 28

## 2022-08-03 MED ORDER — METOCLOPRAMIDE HCL 5 MG/ML IJ SOLN: 5.0000 mg | Freq: Three times a day (TID) | INTRAMUSCULAR | Status: DC | PRN

## 2022-08-03 MED ORDER — SUGAMMADEX SODIUM 200 MG/2ML IV SOLN
INTRAVENOUS | Status: DC | PRN
  Administered 2022-08-03: 150 mg via INTRAVENOUS

## 2022-08-03 MED ORDER — ORAL CARE MOUTH RINSE
15.0000 mL | Freq: Once | OROMUCOSAL | Status: AC
Start: 2022-08-03 — End: 2022-08-03

## 2022-08-03 MED ORDER — PANTOPRAZOLE SODIUM 40 MG PO TBEC
40.0000 mg | DELAYED_RELEASE_TABLET | Freq: Every day | ORAL | Status: DC
  Administered 2022-08-04: 40 mg via ORAL
  Filled 2022-08-03: qty 1

## 2022-08-03 MED ORDER — STERILE WATER FOR IRRIGATION IR SOLN
Status: DC | PRN
  Administered 2022-08-03: 2000 mL

## 2022-08-03 MED ORDER — LIDOCAINE 2% (20 MG/ML) 5 ML SYRINGE
INTRAMUSCULAR | Status: DC | PRN
  Administered 2022-08-03: 60 mg via INTRAVENOUS

## 2022-08-03 MED ORDER — ASPIRIN 81 MG PO TBEC: 81.0000 mg | DELAYED_RELEASE_TABLET | Freq: Two times a day (BID) | ORAL | 0 refills | Status: AC

## 2022-08-03 MED ORDER — FENTANYL CITRATE PF 50 MCG/ML IJ SOSY
PREFILLED_SYRINGE | INTRAMUSCULAR | Status: AC
  Filled 2022-08-03: qty 3

## 2022-08-03 MED ORDER — OXYCODONE HCL 5 MG PO TABS
5.0000 mg | ORAL_TABLET | ORAL | Status: DC | PRN
  Administered 2022-08-04: 5 mg via ORAL
  Filled 2022-08-03: qty 1

## 2022-08-03 MED ORDER — BUPIVACAINE LIPOSOME 1.3 % IJ SUSP: 10.0000 mL | Freq: Once | INTRAMUSCULAR | Status: DC

## 2022-08-03 MED ORDER — CELECOXIB 200 MG PO CAPS
200.0000 mg | ORAL_CAPSULE | Freq: Once | ORAL | Status: AC
  Administered 2022-08-03: 200 mg via ORAL
  Filled 2022-08-03: qty 1

## 2022-08-03 MED ORDER — HYDROXYZINE HCL 10 MG PO TABS: 10.0000 mg | ORAL_TABLET | Freq: Two times a day (BID) | ORAL | Status: DC | PRN

## 2022-08-03 MED ORDER — DEXAMETHASONE SODIUM PHOSPHATE 10 MG/ML IJ SOLN
INTRAMUSCULAR | Status: DC | PRN
  Administered 2022-08-03: 10 mg via INTRAVENOUS

## 2022-08-03 MED ORDER — BUMETANIDE 1 MG PO TABS
1.0000 mg | ORAL_TABLET | Freq: Two times a day (BID) | ORAL | Status: DC
  Administered 2022-08-03 – 2022-08-04 (×2): 1 mg via ORAL
  Filled 2022-08-03 (×2): qty 1

## 2022-08-03 MED ORDER — PANTOPRAZOLE SODIUM 40 MG PO TBEC: 40.0000 mg | DELAYED_RELEASE_TABLET | Freq: Every day | ORAL | Status: DC | PRN

## 2022-08-03 MED ORDER — LACTATED RINGERS IV BOLUS: 250.0000 mL | Freq: Once | INTRAVENOUS | Status: DC

## 2022-08-03 MED ORDER — CEFADROXIL 500 MG PO CAPS
1000.0000 mg | ORAL_CAPSULE | Freq: Two times a day (BID) | ORAL | Status: DC
  Administered 2022-08-03 – 2022-08-04 (×2): 1000 mg via ORAL
  Filled 2022-08-03 (×2): qty 2

## 2022-08-03 MED ORDER — ALBUTEROL SULFATE (2.5 MG/3ML) 0.083% IN NEBU: 1.2500 mg | INHALATION_SOLUTION | RESPIRATORY_TRACT | Status: DC | PRN

## 2022-08-03 MED ORDER — SODIUM CHLORIDE (PF) 0.9 % IJ SOLN
INTRAMUSCULAR | Status: DC | PRN
  Administered 2022-08-03: 30 mL

## 2022-08-03 MED ORDER — TRAMADOL HCL 50 MG PO TABS
50.0000 mg | ORAL_TABLET | Freq: Four times a day (QID) | ORAL | Status: DC
  Administered 2022-08-03 – 2022-08-04 (×5): 50 mg via ORAL
  Filled 2022-08-03 (×5): qty 1

## 2022-08-03 MED ORDER — BUPIVACAINE LIPOSOME 1.3 % IJ SUSP
INTRAMUSCULAR | Status: AC
  Filled 2022-08-03: qty 10

## 2022-08-03 MED ORDER — AMISULPRIDE (ANTIEMETIC) 5 MG/2ML IV SOLN: 10.0000 mg | Freq: Once | INTRAVENOUS | Status: DC | PRN

## 2022-08-03 MED ORDER — METFORMIN HCL ER 500 MG PO TB24
500.0000 mg | ORAL_TABLET | Freq: Two times a day (BID) | ORAL | Status: DC
  Administered 2022-08-03 – 2022-08-04 (×2): 500 mg via ORAL
  Filled 2022-08-03 (×2): qty 1

## 2022-08-03 MED ORDER — POVIDONE-IODINE 10 % EX SWAB
2.0000 | Freq: Once | CUTANEOUS | Status: AC
  Administered 2022-08-03: 2 via TOPICAL

## 2022-08-03 MED ORDER — BUPIVACAINE-EPINEPHRINE 0.25% -1:200000 IJ SOLN
INTRAMUSCULAR | Status: DC | PRN
  Administered 2022-08-03: 30 mL

## 2022-08-03 MED ORDER — ONDANSETRON HCL 4 MG/2ML IJ SOLN: 4.0000 mg | Freq: Four times a day (QID) | INTRAMUSCULAR | Status: DC | PRN

## 2022-08-03 MED ORDER — LACTATED RINGERS IV BOLUS
500.0000 mL | Freq: Once | INTRAVENOUS | Status: AC
  Administered 2022-08-03: 500 mL via INTRAVENOUS

## 2022-08-03 MED ORDER — TRANEXAMIC ACID-NACL 1000-0.7 MG/100ML-% IV SOLN
1000.0000 mg | Freq: Once | INTRAVENOUS | Status: AC
  Administered 2022-08-03: 1000 mg via INTRAVENOUS
  Filled 2022-08-03: qty 100

## 2022-08-03 MED ORDER — PHENOL 1.4 % MT LIQD: 1.0000 | OROMUCOSAL | Status: DC | PRN

## 2022-08-03 MED ORDER — METHOCARBAMOL 500 MG PO TABS
500.0000 mg | ORAL_TABLET | Freq: Four times a day (QID) | ORAL | Status: DC | PRN
  Administered 2022-08-04: 500 mg via ORAL
  Filled 2022-08-03: qty 1

## 2022-08-03 MED ORDER — FENTANYL CITRATE (PF) 250 MCG/5ML IJ SOLN
INTRAMUSCULAR | Status: DC | PRN
  Administered 2022-08-03: 25 ug via INTRAVENOUS
  Administered 2022-08-03: 50 ug via INTRAVENOUS
  Administered 2022-08-03: 25 ug via INTRAVENOUS

## 2022-08-03 MED ORDER — BUPIVACAINE LIPOSOME 1.3 % IJ SUSP
INTRAMUSCULAR | Status: DC | PRN
  Administered 2022-08-03: 10 mL

## 2022-08-03 MED ORDER — LACTATED RINGERS IV SOLN: INTRAVENOUS | Status: DC

## 2022-08-03 MED ORDER — PHENYLEPHRINE 80 MCG/ML (10ML) SYRINGE FOR IV PUSH (FOR BLOOD PRESSURE SUPPORT)
PREFILLED_SYRINGE | INTRAVENOUS | Status: DC | PRN
  Administered 2022-08-03: 80 ug via INTRAVENOUS

## 2022-08-03 MED ORDER — OMEPRAZOLE MAGNESIUM 20 MG PO TBEC: 20.0000 mg | DELAYED_RELEASE_TABLET | Freq: Every day | ORAL | Status: DC | PRN

## 2022-08-03 MED ORDER — DILTIAZEM HCL ER COATED BEADS 180 MG PO CP24
180.0000 mg | ORAL_CAPSULE | Freq: Every day | ORAL | Status: DC
  Administered 2022-08-04: 180 mg via ORAL
  Filled 2022-08-03: qty 1

## 2022-08-03 MED ORDER — ASPIRIN 81 MG PO CHEW
81.0000 mg | CHEWABLE_TABLET | Freq: Two times a day (BID) | ORAL | Status: DC
  Administered 2022-08-03 – 2022-08-04 (×2): 81 mg via ORAL
  Filled 2022-08-03 (×2): qty 1

## 2022-08-03 MED ORDER — GABAPENTIN 300 MG PO CAPS
300.0000 mg | ORAL_CAPSULE | Freq: Every day | ORAL | Status: DC
  Administered 2022-08-03: 300 mg via ORAL
  Filled 2022-08-03: qty 1

## 2022-08-03 MED ORDER — FOLIC ACID 1 MG PO TABS
1.0000 mg | ORAL_TABLET | Freq: Every day | ORAL | Status: DC
  Administered 2022-08-04: 1 mg via ORAL
  Filled 2022-08-03: qty 1

## 2022-08-03 MED ORDER — METHOCARBAMOL 500 MG IVPB - SIMPLE MED
500.0000 mg | Freq: Four times a day (QID) | INTRAVENOUS | Status: DC | PRN
  Administered 2022-08-03: 500 mg via INTRAVENOUS

## 2022-08-03 MED ORDER — MENTHOL 3 MG MT LOZG: 1.0000 | LOZENGE | OROMUCOSAL | Status: DC | PRN

## 2022-08-03 MED ORDER — DOCUSATE SODIUM 100 MG PO CAPS
100.0000 mg | ORAL_CAPSULE | Freq: Two times a day (BID) | ORAL | Status: DC
  Administered 2022-08-03 – 2022-08-04 (×2): 100 mg via ORAL
  Filled 2022-08-03 (×2): qty 1

## 2022-08-03 MED ORDER — TIZANIDINE HCL 2 MG PO TABS: 2.0000 mg | ORAL_TABLET | Freq: Four times a day (QID) | ORAL | 0 refills | Status: DC | PRN

## 2022-08-03 MED ORDER — TRANEXAMIC ACID 1000 MG/10ML IV SOLN
2000.0000 mg | INTRAVENOUS | Status: AC
  Filled 2022-08-03: qty 20

## 2022-08-03 MED ORDER — ONDANSETRON HCL 4 MG/2ML IJ SOLN
INTRAMUSCULAR | Status: DC | PRN
  Administered 2022-08-03: 4 mg via INTRAVENOUS

## 2022-08-03 SURGICAL SUPPLY — 46 items
BAG COUNTER SPONGE SURGICOUNT (BAG) ×1 IMPLANT
BAG DECANTER FOR FLEXI CONT (MISCELLANEOUS) ×2 IMPLANT
BAG SPNG CNTER NS LX DISP (BAG) ×1
BLADE SAW SGTL 18X1.27X75 (BLADE) ×1 IMPLANT
COVER PERINEAL POST (MISCELLANEOUS) ×1 IMPLANT
COVER SURGICAL LIGHT HANDLE (MISCELLANEOUS) ×1 IMPLANT
DRAPE STERI IOBAN 125X83 (DRAPES) ×1 IMPLANT
DRAPE U-SHAPE 47X51 STRL (DRAPES) ×2 IMPLANT
DRESSING AQUACEL AG SP 3.5X10 (GAUZE/BANDAGES/DRESSINGS) IMPLANT
DRSG AQUACEL AG ADV 3.5X10 (GAUZE/BANDAGES/DRESSINGS) ×1 IMPLANT
DRSG AQUACEL AG SP 3.5X10 (GAUZE/BANDAGES/DRESSINGS) ×1
DURAPREP 26ML APPLICATOR (WOUND CARE) ×1 IMPLANT
ELECT BLADE TIP CTD 4 INCH (ELECTRODE) ×1 IMPLANT
ELECT REM PT RETURN 15FT ADLT (MISCELLANEOUS) ×1 IMPLANT
ELIMINATOR HOLE APEX DEPUY (Hips) IMPLANT
GLOVE BIO SURGEON STRL SZ7.5 (GLOVE) ×1 IMPLANT
GLOVE BIO SURGEON STRL SZ8.5 (GLOVE) ×1 IMPLANT
GLOVE BIOGEL PI IND STRL 8 (GLOVE) ×1 IMPLANT
GLOVE BIOGEL PI IND STRL 9 (GLOVE) ×1 IMPLANT
GOWN STRL REUS W/ TWL XL LVL3 (GOWN DISPOSABLE) ×2 IMPLANT
GOWN STRL REUS W/TWL XL LVL3 (GOWN DISPOSABLE) ×2
HEAD FEM DLT TS CER 36X+5.0 (Head) IMPLANT
HOLDER FOLEY CATH W/STRAP (MISCELLANEOUS) ×1 IMPLANT
KIT TURNOVER KIT A (KITS) IMPLANT
LINER NEUTRAL 52X36MM PLUS 4 (Liner) IMPLANT
MANIFOLD NEPTUNE II (INSTRUMENTS) ×1 IMPLANT
NDL HYPO 21X1.5 SAFETY (NEEDLE) ×2 IMPLANT
NEEDLE HYPO 21X1.5 SAFETY (NEEDLE) ×2 IMPLANT
NS IRRIG 1000ML POUR BTL (IV SOLUTION) ×1 IMPLANT
PACK ANTERIOR HIP CUSTOM (KITS) ×1 IMPLANT
PIN SECTOR W/GRIP ACE CUP 52MM (Hips) IMPLANT
SCREW 6.5MMX25MM (Screw) IMPLANT
SCREW 6.5MMX30MM (Screw) IMPLANT
SCREW 6.5MMX35MM (Screw) IMPLANT
SPIKE FLUID TRANSFER (MISCELLANEOUS) ×1 IMPLANT
SUT ETHIBOND NAB CT1 #1 30IN (SUTURE) ×1 IMPLANT
SUT VIC AB 0 CT1 27 (SUTURE)
SUT VIC AB 0 CT1 27XBRD ANBCTR (SUTURE) IMPLANT
SUT VIC AB 1 CTX 36 (SUTURE) ×1
SUT VIC AB 1 CTX36XBRD ANBCTR (SUTURE) ×1 IMPLANT
SUT VIC AB 2-0 CT1 27 (SUTURE) ×1
SUT VIC AB 2-0 CT1 TAPERPNT 27 (SUTURE) ×1 IMPLANT
SUT VIC AB 3-0 CT1 27 (SUTURE) ×1
SUT VIC AB 3-0 CT1 TAPERPNT 27 (SUTURE) ×1 IMPLANT
SYR CONTROL 10ML LL (SYRINGE) ×3 IMPLANT
TRAY FOLEY MTR SLVR 16FR STAT (SET/KITS/TRAYS/PACK) IMPLANT

## 2022-08-03 NOTE — Transfer of Care (Signed)
Immediate Anesthesia Transfer of Care Note  Patient: [name]  Procedure(s) Performed: TOTAL HIP REVISION (Right: Hip)  Patient Location: PACU  Anesthesia Type:General  Level of Consciousness: awake, alert  and oriented  Airway & Oxygen Therapy: Patient Spontanous Breathing and Patient connected to face mask oxygen  Post-op Assessment: Report given to RN and Post -op Vital signs reviewed and stable  Post vital signs: Reviewed and stable  Last Vitals:  Vitals Value Taken Time  BP 118/82 08/03/22 0936  Temp    Pulse    Resp    SpO2    Vitals shown include unvalidated device data.  Last Pain:  Vitals:   08/03/22 0657  TempSrc: Oral         Complications: No notable events documented.

## 2022-08-03 NOTE — Interval H&P Note (Signed)
History and Physical Interval Note:  08/03/2022 7:02 AM  [name]  has presented today for surgery, with the diagnosis of RIGHT HIP [place].  The various methods of treatment have been discussed with the patient and family. After consideration of risks, benefits and other options for treatment, the patient has consented to  Procedure(s): TOTAL HIP REVISION (Right) as a surgical intervention.  The patient's history has been reviewed, patient examined, no change in status, stable for surgery.  I have reviewed the patient's chart and labs.  Questions were answered to the patient's satisfaction.     Kerin Salen

## 2022-08-03 NOTE — Op Note (Signed)
PATIENT ID:      [name]  MRN:     [id number] DOB/AGE:    06/07/1946 / 76 y.o.  OPERATIVE REPORT   DATE OF PROCEDURE:  08/03/2022      PREOPERATIVE DIAGNOSIS:  RIGHT HIP REPLACEMENT loose Depuy Pinnacle cup                                                        POSTOPERATIVE DIAGNOSIS:  Same                                                         PROCEDURE: Revision Anterior R total hip arthroplasty with removal of loose 48 mm Pinnacle shell and revision to a 52 mm DePuy Gryption sector shell with 3 down screws and an apex hole illuminate her.  We also placed a new +4 polyliner and a +5 x 36 mm ceramic head, with a titanium liner. SURGEON: Kerin Salen  ASSISTANT:   Kerry Hough. Barton Dubois  (present throughout entire procedure and necessary for timely completion of the procedure)   ANESTHESIA: Spinal, Exparel '133mg'$  injection BLOOD LOSS: 400 cc FLUID REPLACEMENT: 1800 cc crystalloid TRANEXAMIC ACID: 1gm IV, 2gm Topical COMPLICATIONS: none  Specimens: Synovial tissue was sent for Gram stain and culture.   INDICATIONS FOR PROCEDURE: A 76 y.o. year-old had a primary uncemented anterior right total hip arthroplasty in 2017.  Did well until this year when she had sensation of subluxing or clunking in the hip.  New x-rays showed that the cuff is gone from 50 degrees of abduction to almost vertical and was loose.  There was no obvious osteolysis she had no fevers or chills her inflammatory markers were negative.  To decrease pain and increase function she desires revision of the acetabular component.  The risk and benefits of surgery were discussed and all questions answered.      PROCEDURE IN DETAIL: The patient was identified by armband,   received preoperative IV antibiotics in the holding area at [facility], taken to the operating room , appropriate anesthetic monitors   were attached and anesthesia was induced with the patient on the gurney. HANA boots were applied to the feet,  and the patient  was transferred to the HANA table with a peroneal post and support underneath the non-operative leg. Theoperative lower extremity was then prepped and draped in the usual sterile fashion from just above the iliac crest to the knee. And a timeout procedure was performed. Kerry Hough. Hardin Negus [facility] was present and scrubbed throughout the case, critical for assistance with, positioning, exposure, retraction, instrumentation, and closure.Skin along incision area was injected with 10 cc of Exparel solution. We then made a 14 cm incision along the interval at the leading edge of the tensor fascia lata of starting at 2 cm lateral to the ASIS. Small bleeders in the skin and subcutaneous tissue identified and cauterized we dissected down to the fascia and made an incision in the fascia allowing [place] to elevate the fascia of the tensor muscle and exploited the interval between the rectus and the tensor fascia lata.  Once we isolated  the tensor fascia lata a small Cobra retractor was placed between the tensor fascia lata and the superior pseudocapsule of the total hip and then a second 1 between the inferior pseudocapsule and the rectus femoris.  This allowed [place] to remove the scar tissue from the anterior aspect of the neck of the femoral implant and we did find small pieces of polyethylene in the synovium.  Traction was applied with external rotation dislocating the femoral head which was then removed with a small metal cylinder and a mallet.  We then continued to dissected scar tissue from around the rim of the acetabular implant.  A skinny mini retractor was placed between the origin of the rectus femoris and the acetabulum this medium was placed along the inferior aspect of the acetabulum and after removing the scar tissue were able to tuck the femoral neck between the posterior lateral acetabulum and muscle using a small Cobra.  We then removed all scar tissue from around the rim of the acetabular component.  We  then removed the apex hole eliminator threaded in the driving rod and the acetabular component was not grossly loose.  At this point we placed a neutral trial liner and using the 48 mm short Innomed curved osteotome work to arrive around the periphery of the acetabular component without too much difficulty.  We then removed the Innomed osteotome and the trial liner once again threaded in the driving rod and at this point the acetabulum is loose enough to remove manually.  Tissue was sent for Gram stain and culture.  Overall the tissue did not look inflamed or infected.  We then started out with a 47 mm reamer and gradually worked our way up to a 52 mm reamer obtaining good fit and fill.  Because of the thinness of the acetabulum we selected a 52 mm Gryption cup which was hammered into place and 45 degrees of abduction and 20 degrees of anteversion.  3 supplemental screws were placed under C arm control.  We then placed the apex hole illuminate her a +4 polyliner and trialed with a +1 and +5 36 mm head and found leg lengths were equal with the +5 head.  The trial head was removed and a new +5 ceramic head with titanium liner was selected and hammered onto the stem.  The hip was again reduced stability checked and found to be excellent.  The wound was thoroughly irrigated out with normal saline solution.  Exparel was injected in the soft tissues with a 22-gauge by 1/2 inch needle.  Finally the tissue was coated with trans [place] acid using a soaked sponge.  We then closed in layers using 3-0 Vicryl suture on the fascia subcutaneous and subcuticular tissues.  A dressing of Aquacel was applied.  The patient was awakened extubated taken off of the [place] table onto a gurney and transported to the recovery room without difficulty.     The hip was reduced and final C-arm images obtained. The wound was thoroughly irrigated with normal saline solution. We repaired the ant capsule and the tensor fascia lot a with running 0  vicryl suture. the subcutaneous tissue was closed with 2-0 and 3-0 Vicryl suture followed by an Aquacil dressing. At this point the patient was awaken and transferred to hospital gurney without difficulty.   Kerin Salen 08/03/2022, 7:04 AM

## 2022-08-03 NOTE — Discharge Instructions (Signed)

## 2022-08-03 NOTE — Anesthesia Postprocedure Evaluation (Signed)
Anesthesia Post Note  Patient: [name]  Procedure(s) Performed: TOTAL HIP REVISION (Right: Hip)     Patient location during evaluation: PACU Anesthesia Type: General Level of consciousness: sedated Pain management: pain level controlled Vital Signs Assessment: post-procedure vital signs reviewed and stable Respiratory status: spontaneous breathing and respiratory function stable Cardiovascular status: stable Postop Assessment: no apparent nausea or vomiting Anesthetic complications: no   No notable events documented.  Last Vitals:  Vitals:   08/03/22 1100 08/03/22 1129  BP: 119/68 127/61  Pulse: 72 67  Resp: 16 17  Temp: 36.8 C 36.8 C  SpO2: 93% 95%    Last Pain:  Vitals:   08/03/22 1129  TempSrc: Oral  PainSc: 3                  Mava Suares DANIEL

## 2022-08-03 NOTE — Evaluation (Signed)
Physical Therapy Evaluation Patient Details Name: [name] MRN: [id number] DOB: 20-Sep-1946 Today's Date: 08/03/2022  History of Present Illness  76 yo female s/p R THA revision on 08/03/22. PMH: neuropathy, RA, OA, DM, HTN, L THA  Clinical Impression  Pt is s/p THA revision resulting in the deficits listed below (see PT Problem List).  Pt is pleasant and motivated to mobilize. Amb 20' and 15' with RW and min/guard assist. HEP initiated  Anticipate steady progress in acute setting  Pt will benefit from skilled PT to increase their independence and safety with mobility to allow discharge to the venue listed below.         Recommendations for follow up therapy are one component of a multi-disciplinary discharge planning process, led by the attending physician.  Recommendations may be updated based on patient status, additional functional criteria and insurance authorization.  Follow Up Recommendations Follow physician's recommendations for discharge plan and follow up therapies      Assistance Recommended at Discharge Set up Supervision/Assistance  Patient can return home with the following  Help with stairs or ramp for entrance;Assist for transportation;Assistance with cooking/housework    Equipment Recommendations None recommended by PT  Recommendations for Other Services       Functional Status Assessment Patient has had a recent decline in their functional status and demonstrates the ability to make significant improvements in function in a reasonable and predictable amount of time.     Precautions / Restrictions Precautions Precautions: Fall Restrictions Weight Bearing Restrictions: No Other Position/Activity Restrictions: WBAT      Mobility  Bed Mobility Overal bed mobility: Needs Assistance Bed Mobility: Supine to Sit     Supine to sit: Min assist     General bed mobility comments: light assist with RLE, incr time, good pt effort    Transfers Overall  transfer level: Needs assistance Equipment used: Rolling walker (2 wheels) Transfers: Sit to/from Stand Sit to Stand: Min guard           General transfer comment: for safety  to come to stand    Ambulation/Gait Ambulation/Gait assistance: Min guard Gait Distance (Feet): 20 Feet (15') Assistive device: Rolling walker (2 wheels) Gait Pattern/deviations: Step-to pattern, Step-through pattern       General Gait Details: cues for sequence adn RW position, good stability, no LOB  Stairs            Wheelchair Mobility    Modified Rankin (Stroke Patients Only)       Balance                                             Pertinent Vitals/Pain Pain Assessment Pain Assessment: 0-10 Pain Score: 2  Pain Location: right hip Pain Descriptors / Indicators: Aching, Sore Pain Intervention(s): Limited activity within patient's tolerance, Monitored during session, Premedicated before session, Repositioned    Home Living Family/patient expects to be discharged to:: Private residence Living Arrangements: Other relatives;Spouse/significant other Available Help at Discharge: Family;Available 24 hours/day   Home Access: Level entry       Home Layout: One level Home Equipment: Rollator (4 wheels);Rolling Walker (2 wheels);Adaptive equipment;BSC/3in1      Prior Function Prior Level of Function : Independent/Modified Independent;Working/employed;Driving             Mobility Comments: 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  Hand Dominance        Extremity/Trunk Assessment   Upper Extremity Assessment Upper Extremity Assessment: Overall WFL for tasks assessed    Lower Extremity Assessment Lower Extremity Assessment: RLE deficits/detail RLE Deficits / Details: grossly 3/5       Communication   Communication: No difficulties  Cognition Arousal/Alertness: Awake/alert Behavior During Therapy: WFL for  tasks assessed/performed Overall Cognitive Status: Within Functional Limits for tasks assessed                                          General Comments      Exercises Total Joint Exercises Ankle Circles/Pumps: AROM, Both, 10 reps Quad Sets: Both, 10 reps, AROM Gluteal Sets: AROM, Both, 5 reps   Assessment/Plan    PT Assessment Patient needs continued PT services  PT Problem List Decreased strength;Decreased range of motion;Decreased activity tolerance;Decreased balance;Decreased knowledge of use of DME;Decreased mobility       PT Treatment Interventions DME instruction;Therapeutic exercise;Gait training;Therapeutic activities;Patient/family education;Functional mobility training    PT Goals (Current goals can be found in the Care Plan section)  Acute Rehab PT Goals Patient Stated Goal: be able to walk with less hip pain PT Goal Formulation: With patient Time For Goal Achievement: 08/10/22 Potential to Achieve Goals: Good    Frequency 7X/week     Co-evaluation               AM-PAC PT "6 Clicks" Mobility  Outcome Measure Help needed turning from your back to your side while in a flat bed without using bedrails?: A Little Help needed moving from lying on your back to sitting on the side of a flat bed without using bedrails?: A Little Help needed moving to and from a bed to a chair (including a wheelchair)?: A Little Help needed standing up from a chair using your arms (e.g., wheelchair or bedside chair)?: A Little Help needed to walk in hospital room?: A Little Help needed climbing 3-5 steps with a railing? : A Little 6 Click Score: 18    End of Session Equipment Utilized During Treatment: Gait belt Activity Tolerance: Patient tolerated treatment well Patient left: in chair;with chair alarm set;with call bell/phone within reach;with family/visitor present Nurse Communication: Mobility status PT Visit Diagnosis: Other abnormalities of gait and  mobility (R26.89);Difficulty in walking, not elsewhere classified (R26.2)    Time: 4944-9675 PT Time Calculation (min) (ACUTE ONLY): 12 min   Charges:   PT Evaluation $PT Eval Low Complexity: [place], PT  Acute Rehab Dept [facility]) [phone]  [facility] Weekend Pager [facility] only)  [phone]  08/03/2022   [facility] 08/03/2022, 5:05 PM

## 2022-08-03 NOTE — Anesthesia Procedure Notes (Signed)
Procedure Name: Intubation Date/Time: 08/03/2022 7:23 AM  Performed by: Mounir Skipper, Myrna D, CRNAPre-anesthesia Checklist: Patient identified, Emergency Drugs available, Suction available and Patient being monitored Patient Re-evaluated:Patient Re-evaluated prior to induction Oxygen Delivery Method: Circle system utilized Preoxygenation: Pre-oxygenation with 100% oxygen Induction Type: IV induction Ventilation: Mask ventilation without difficulty Laryngoscope Size: Mac and 3 Grade View: Grade I Tube type: Oral Number of attempts: 1 Airway Equipment and Method: Stylet and Oral airway Placement Confirmation: ETT inserted through vocal cords under direct vision, positive ETCO2 and breath sounds checked- equal and bilateral Secured at: 21 cm Tube secured with: Tape Dental Injury: Teeth and Oropharynx as per pre-operative assessment

## 2022-08-04 ENCOUNTER — Encounter (HOSPITAL_COMMUNITY): Payer: Self-pay | Admitting: Orthopedic Surgery

## 2022-08-04 LAB — GLUCOSE, CAPILLARY
Glucose-Capillary: 150 mg/dL — ABNORMAL HIGH (ref 70–99)
Glucose-Capillary: 154 mg/dL — ABNORMAL HIGH (ref 70–99)

## 2022-08-04 NOTE — Progress Notes (Signed)
PATIENT ID: [name]  MRN: [id number]  DOB/AGE:  [birth year] / 76 y.o.  1 Day Post-Op Procedure(s) (LRB): TOTAL HIP REVISION (Right)    PROGRESS NOTE Subjective: Patient is alert, oriented, no Nausea, no Vomiting, yes passing gas, . Taking PO well. Denies SOB, Chest or Calf Pain. Using Incentive Spirometer, PAS in place. Ambulate 3' Patient reports pain as  2/10  .    Objective: Vital signs in last 24 hours: Vitals:   08/03/22 1357 08/03/22 2026 08/04/22 0207 08/04/22 0446  BP: 113/61 98/85 (!) 110/56 (!) 111/53  Pulse: 75 68 78 62  Resp: '18 18 18 18  '$ Temp: 98.4 F (36.9 C) 98.1 F (36.7 C) 98.1 F (36.7 C) 97.7 F (36.5 C)  TempSrc: Oral Oral Oral Oral  SpO2: 99% 97% 98% 100%  Weight:      Height:          Intake/Output from previous day: I/O last 3 completed shifts: In: 4562 [P.O.:840; I.V.:3072; IV Piggyback:649.9] Out: 2450 [Urine:2200; Blood:250]   Intake/Output this shift: No intake/output data recorded.   LABORATORY DATA: Recent Labs    08/03/22 1156 08/03/22 1658 08/03/22 2210  GLUCAP 118* 192* 157*    Examination: Neurologically intact ABD soft Neurovascular intact Sensation intact distally Intact pulses distally Dorsiflexion/Plantar flexion intact Incision: dressing C/D/I No cellulitis present Compartment soft} XR AP&Lat of hip shows well placed\fixed THA  Assessment:   1 Day Post-Op Procedure(s) (LRB): TOTAL HIP REVISION (Right) ADDITIONAL DIAGNOSIS:  Expected Acute Blood Loss Anemia, RA, Tremor, DM, COPD Anticipated LOS equal to or greater than 2 midnights due to - Age 74 and older with one or more of the following:  - Obesity  - Expected need for hospital services (PT, OT, Nursing) required for safe  discharge  - Anticipated need for postoperative skilled nursing care or inpatient rehab  OR  Inpt only procedure  Plan: PT/OT WBAT, THA  DVT Prophylaxis: SCDx72 hrs, ASA 81 mg BID x 2 weeks  DISCHARGE PLAN: Home, today if passes  PT.  DISCHARGE NEEDS: HHPT, Walker, and 3-in-1 comode seat Patient ID: [name], female   DOB: 1946-06-09, 76 y.o.   MRN: [id number]

## 2022-08-04 NOTE — Plan of Care (Signed)
  Problem: Education: Goal: Knowledge of the prescribed therapeutic regimen will improve Outcome: Progressing   Problem: Activity: Goal: Ability to tolerate increased activity will improve Outcome: Progressing   Problem: Pain Management: Goal: Pain level will decrease with appropriate interventions Outcome: Progressing   Problem: Safety: Goal: Ability to remain free from injury will improve Outcome: Progressing   

## 2022-08-04 NOTE — Discharge Summary (Signed)
Patient ID: [name] MRN: [id number] DOB/AGE: 26-Mar-1946 76 y.o.  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[facility]) Active Problems:   H/O total shoulder replacement, right   Discharge Diagnoses:  Same  Past Medical History:  Diagnosis Date   Anxiety    Arthritis    "knees, ankles, fingers" (07/06/2018)   Asthma    Chronic lower back pain    Dyspnea    with asthma attacks    Dysrhythmia    Family history of adverse reaction to anesthesia    my first cousin had difficulty waking up    Fibroid    ovarian   GERD (gastroesophageal reflux disease)    Hip osteoarthritis    Left   History of staph infection    in hospital for 11 days/ in 2007   Hyperlipidemia    Hypertension    Radiculopathy    Tremor 03/30/2022   Type II diabetes mellitus ([place])    Upper extremity weakness 03/30/2022   Wears glasses     Surgeries: Procedure(s): TOTAL HIP REVISION on 08/03/2022   Consultants:   Discharged Condition: Improved  Hospital Course: ELLESE JULIUS is an 76 y.o. female who was admitted 08/03/2022 for operative treatment ofFailed total hip arthroplasty ([place]). Patient has severe unremitting pain that affects sleep, daily activities, and work/hobbies. After pre-op clearance the patient was taken to the operating room on 08/03/2022 and underwent  Procedure(s): TOTAL HIP REVISION.    Patient was given perioperative antibiotics:  Anti-infectives (From admission, onward)    Start     Dose/Rate Route Frequency Ordered Stop   08/03/22 2200  cefadroxil (DURICEF) capsule 1,000 mg        1,000 mg Oral 2 times daily 08/03/22 1115     08/03/22 1300  ceFAZolin (ANCEF) IVPB 1 g/50 mL premix        1 g 100 mL/hr over 30 Minutes Intravenous Every 6 hours 08/03/22 1115 08/03/22 1820   08/03/22 0600  ceFAZolin (ANCEF) IVPB 2g/100 mL premix        2 g 200 mL/hr over 30 Minutes Intravenous On call to O.R.  08/03/22 0211 08/03/22 0724        Patient was given sequential compression devices, early ambulation, and chemoprophylaxis to prevent DVT.  Patient benefited maximally from hospital stay and there were no complications.    Recent vital signs: Patient Vitals for the past 24 hrs:  BP Temp Temp src Pulse Resp SpO2  08/04/22 0818 118/64 97.8 F (36.6 C) Oral 75 16 100 %  08/04/22 0446 (!) 111/53 97.7 F (36.5 C) Oral 62 18 100 %  08/04/22 0207 (!) 110/56 98.1 F (36.7 C) Oral 78 18 98 %  08/03/22 2026 98/85 98.1 F (36.7 C) Oral 68 18 97 %  08/03/22 1357 113/61 98.4 F (36.9 C) Oral 75 18 99 %     Recent laboratory studies: No results for input(s): "WBC", "HGB", "HCT", "PLT", "NA", "K", "CL", "CO2", "BUN", "CREATININE", "GLUCOSE", "INR", "CALCIUM" in the last 72 hours.  Invalid input(s): "PT", "2"   Discharge Medications:   Allergies as of 08/04/2022       Reactions   Ivp Dye [iodinated Contrast Media] Nausea And Vomiting   Prednisone Other (See Comments)   reflux        Medication List     STOP taking these medications    cyclobenzaprine 5 MG tablet Commonly known as: FLEXERIL  TAKE these medications    albuterol 108 (90 Base) MCG/ACT inhaler Commonly known as: VENTOLIN HFA Inhale 2 puffs into the lungs every 6 (six) hours as needed for wheezing or shortness of breath.   albuterol 1.25 MG/3ML nebulizer solution Commonly known as: ACCUNEB Take 1 ampule by nebulization every 4 (four) hours as needed for wheezing.   aspirin EC 81 MG tablet Take 1 tablet (81 mg total) by mouth 2 (two) times daily.   bumetanide 1 MG tablet Commonly known as: BUMEX Take 1 mg by mouth 2 (two) times daily.   cefadroxil 500 MG capsule Commonly known as: DURICEF Take 2 capsules (1,000 mg total) by mouth 2 (two) times daily.   cetirizine 10 MG tablet Commonly known as: ZYRTEC Take 10 mg by mouth daily.   cholecalciferol 25 MCG (1000 UNIT) tablet Commonly known as:  VITAMIN D3 Take 1,000 Units by mouth daily.   diclofenac Sodium 1 % Gel Commonly known as: VOLTAREN Apply 4 g topically 4 (four) times daily. What changed:  when to take this reasons to take this   diltiazem 180 MG 24 hr capsule Commonly known as: CARDIZEM CD Take 180 mg by mouth daily.   enalapril 20 MG tablet Commonly known as: VASOTEC Take 20 mg by mouth 2 (two) times daily.   folic acid 644 MCG tablet Commonly known as: FOLVITE Take 800 mcg by mouth daily.   gabapentin 300 MG capsule Commonly known as: NEURONTIN Take 300 mg by mouth at bedtime.   hydrOXYzine 10 MG tablet Commonly known as: ATARAX Take 10-20 mg by mouth 2 (two) times daily as needed for anxiety.   lovastatin 40 MG tablet Commonly known as: MEVACOR Take 40 mg by mouth at bedtime.   meclizine 12.5 MG tablet Commonly known as: ANTIVERT Take 12.5 mg by mouth 2 (two) times daily as needed for dizziness.   metFORMIN 500 MG 24 hr tablet Commonly known as: GLUCOPHAGE-XR Take 500 mg by mouth in the morning and at bedtime.   multivitamin with minerals Tabs tablet Take 1 tablet by mouth daily.   omeprazole 20 MG tablet Commonly known as: PRILOSEC OTC Take 20 mg by mouth daily as needed (heartburn).   ONE TOUCH ULTRA 2 w/Device Kit Check blood sugar twice per week   OneTouch Delica Plus [id number] Misc Use to check blood sugar   glucose blood test strip   OneTouch Ultra test strip Generic drug: glucose blood   oxyCODONE-acetaminophen 5-325 MG tablet Commonly known as: PERCOCET/ROXICET Take 1 tablet by mouth every 4 (four) hours as needed for severe pain. What changed:  how much to take reasons to take this   sodium chloride 0.65 % Soln nasal spray Commonly known as: OCEAN Place 1-2 sprays into the nose 4 (four) times daily as needed for congestion.   tiZANidine 2 MG tablet Commonly known as: ZANAFLEX Take 1 tablet (2 mg total) by mouth every 6 (six) hours as needed.   Trelegy Ellipta  200-62.5-25 MCG/ACT Aepb Generic drug: Fluticasone-Umeclidin-Vilant Take 1 puff by mouth daily.               Durable Medical Equipment  (From admission, onward)           Start     Ordered   08/03/22 1115  DME 3 n 1  Once        08/03/22 1115   08/03/22 0951  DME Walker rolling  Once       Question:  Patient needs a walker  to treat with the following condition  Answer:  Status post right hip replacement   08/03/22 0950              Discharge Care Instructions  (From admission, onward)           Start     Ordered   08/04/22 0000  Weight bearing as tolerated        08/04/22 1333   08/03/22 0000  Weight bearing as tolerated        08/03/22 0944            Diagnostic Studies: DG HIP UNILAT WITH PELVIS 1V RIGHT  Result Date: 08/03/2022 CLINICAL DATA:  RIGHT anterior hip revision EXAM: DG HIP (WITH OR WITHOUT PELVIS) 1V RIGHT COMPARISON:  05/18/2018 Fluoroscopy time: 0 minutes 42 seconds Dose: 5.5862 mGy Images: Three FINDINGS: Intraoperative images during revision of RIGHT hip prosthesis. No fracture or dislocation seen on AP imaging. Indwelling LEFT hip prosthesis. Bones demineralized. IMPRESSION: RIGHT hip prosthesis without acute osseous abnormalities. Electronically Signed   By: Lavonia Dana M.D.   On: 08/03/2022 09:16   DG C-Arm 1-60 Min-No Report  Result Date: 08/03/2022 Fluoroscopy was utilized by the requesting physician.  No radiographic interpretation.   DG Chest 2 View  Result Date: 08/03/2022 CLINICAL DATA:  [id number]. Preoperative for hip arthroplasty revision. No current chest complaints. History of asthma. EXAM: CHEST - 2 VIEW COMPARISON:  Portable chest 01/26/2022 FINDINGS: The heart size and mediastinal contours are stable normal cardiac size mild aortic atherosclerotic changes and tortuosity. Both lungs are clear of infiltrates, with mild short perifissural atelectasis noted on the right. The visualized skeletal structures are intact, with again  noted 3 level lower cervical ACDF plating. IMPRESSION: No active cardiopulmonary disease. Electronically Signed   By: Telford Nab M.D.   On: 08/03/2022 06:40    Disposition: Discharge disposition: 01-Home or Self Care       Discharge Instructions     Call MD / Call 911   Complete by: As directed    If you experience chest pain or shortness of breath, CALL 911 and be transported to the hospital emergency room.  If you develope a fever above 101 F, pus (white drainage) or increased drainage or redness at the wound, or calf pain, call your surgeon's office.   Call MD / Call 911   Complete by: As directed    If you experience chest pain or shortness of breath, CALL 911 and be transported to the hospital emergency room.  If you develope a fever above 101 F, pus (white drainage) or increased drainage or redness at the wound, or calf pain, call your surgeon's office.   Constipation Prevention   Complete by: As directed    Drink plenty of fluids.  Prune juice may be helpful.  You may use a stool softener, such as Colace (over the counter) 100 mg twice a day.  Use MiraLax (over the counter) for constipation as needed.   Constipation Prevention   Complete by: As directed    Drink plenty of fluids.  Prune juice may be helpful.  You may use a stool softener, such as Colace (over the counter) 100 mg twice a day.  Use MiraLax (over the counter) for constipation as needed.   Diet - low sodium heart healthy   Complete by: As directed    Driving restrictions   Complete by: As directed    No driving for 2 weeks   Driving restrictions  Complete by: As directed    No driving for 2 weeks   Increase activity slowly as tolerated   Complete by: As directed    Increase activity slowly as tolerated   Complete by: As directed    Patient may shower   Complete by: As directed    You may shower without a dressing once there is no drainage.  Do not wash over the wound.  If drainage remains, cover wound  with plastic wrap and then shower.   Patient may shower   Complete by: As directed    You may shower without a dressing once there is no drainage.  Do not wash over the wound.  If drainage remains, cover wound with plastic wrap and then shower.   Post-operative opioid taper instructions:   Complete by: As directed    POST-OPERATIVE OPIOID TAPER INSTRUCTIONS: It is important to wean off of your opioid medication as soon as possible. If you do not need pain medication after your surgery it is ok to stop day one. Opioids include: Codeine, Hydrocodone(Norco, Vicodin), Oxycodone(Percocet, oxycontin) and hydromorphone amongst others.  Long term and even short term use of opiods can cause: Increased pain response Dependence Constipation Depression Respiratory depression And more.  Withdrawal symptoms can include Flu like symptoms Nausea, vomiting And more Techniques to manage these symptoms Hydrate well Eat regular healthy meals Stay active Use relaxation techniques(deep breathing, meditating, yoga) Do Not substitute Alcohol to help with tapering If you have been on opioids for less than two weeks and do not have pain than it is ok to stop all together.  Plan to wean off of opioids This plan should start within one week post op of your joint replacement. Maintain the same interval or time between taking each dose and first decrease the dose.  Cut the total daily intake of opioids by one tablet each day Next start to increase the time between doses. The last dose that should be eliminated is the evening dose.      Post-operative opioid taper instructions:   Complete by: As directed    POST-OPERATIVE OPIOID TAPER INSTRUCTIONS: It is important to wean off of your opioid medication as soon as possible. If you do not need pain medication after your surgery it is ok to stop day one. Opioids include: Codeine, Hydrocodone(Norco, Vicodin), Oxycodone(Percocet, oxycontin) and hydromorphone  amongst others.  Long term and even short term use of opiods can cause: Increased pain response Dependence Constipation Depression Respiratory depression And more.  Withdrawal symptoms can include Flu like symptoms Nausea, vomiting And more Techniques to manage these symptoms Hydrate well Eat regular healthy meals Stay active Use relaxation techniques(deep breathing, meditating, yoga) Do Not substitute Alcohol to help with tapering If you have been on opioids for less than two weeks and do not have pain than it is ok to stop all together.  Plan to wean off of opioids This plan should start within one week post op of your joint replacement. Maintain the same interval or time between taking each dose and first decrease the dose.  Cut the total daily intake of opioids by one tablet each day Next start to increase the time between doses. The last dose that should be eliminated is the evening dose.      Weight bearing as tolerated   Complete by: As directed    Weight bearing as tolerated   Complete by: As directed         Follow-up Information  Frederik Pear, MD Follow up in 2 week(s).   Specialty: Orthopedic Surgery Contact information: [place] [address] [phone]                  Signed: Joanell Rising 08/04/2022, 1:34 PM

## 2022-08-04 NOTE — Progress Notes (Signed)
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 PMH: neuropathy, RA, OA, DM, HTN, L THA    PT Comments    Pt progressing well, incr amb distance/tol with pain well controlled.will see again to review HEP and pt should be ready for  d/c later today   Recommendations for follow up therapy are one component of a multi-disciplinary discharge planning process, led by the attending physician.  Recommendations may be updated based on patient status, additional functional criteria and insurance authorization.  Follow Up Recommendations  Follow physician's recommendations for discharge plan and follow up therapies     Assistance Recommended at Discharge Set up Supervision/Assistance  Patient can return home with the following Help with stairs or ramp for entrance;Assist for transportation;Assistance with cooking/housework   Equipment Recommendations  None recommended by PT    Recommendations for Other Services       Precautions / Restrictions Precautions Precautions: Fall Restrictions Weight Bearing Restrictions: No Other Position/Activity Restrictions: WBAT     Mobility  Bed Mobility Overal bed mobility: Needs Assistance Bed Mobility: Supine to Sit     Supine to sit: Supervision     General bed mobility comments: for safety    Transfers Overall transfer level: Needs assistance Equipment used: Rolling walker (2 wheels) Transfers: Sit to/from Stand Sit to Stand: Supervision           General transfer comment: for safety, cues for hand placement    Ambulation/Gait Ambulation/Gait assistance: Supervision, Min guard Gait Distance (Feet): 160 Feet Assistive device: Rolling walker (2 wheels) Gait Pattern/deviations: Step-through pattern       General Gait Details: cues for sequence and RW position, good stability, no  LOB   Stairs             Wheelchair Mobility    Modified Rankin (Stroke Patients Only)       Balance                                            Cognition Arousal/Alertness: Awake/alert Behavior During Therapy: WFL for tasks assessed/performed Overall Cognitive Status: Within Functional Limits for tasks assessed                                          Exercises      General Comments        Pertinent Vitals/Pain Pain Assessment Pain Assessment: 0-10 Pain Score: 4  Pain Location: right hip Pain Descriptors / Indicators: Aching, Sore Pain Intervention(s): Limited activity within patient's tolerance, Monitored during session, Premedicated before session, Repositioned    Home Living                          Prior Function            PT Goals (current goals can now be found in the care plan section) Acute Rehab PT Goals Patient Stated Goal: be able to walk with less hip pain PT Goal Formulation: With patient Time For Goal Achievement: 08/10/22 Potential to Achieve Goals: Good Progress towards PT goals: Progressing toward goals    Frequency    7X/week  PT Plan Current plan remains appropriate    Co-evaluation              AM-PAC PT "6 Clicks" Mobility   Outcome Measure  Help needed turning from your back to your side while in a flat bed without using bedrails?: A Little Help needed moving from lying on your back to sitting on the side of a flat bed without using bedrails?: A Little Help needed moving to and from a bed to a chair (including a wheelchair)?: A Little Help needed standing up from a chair using your arms (e.g., wheelchair or bedside chair)?: A Little Help needed to walk in hospital room?: A Little Help needed climbing 3-5 steps with a railing? : A Little 6 Click Score: 18    End of Session Equipment Utilized During Treatment: Gait belt Activity Tolerance: Patient tolerated  treatment well Patient left: with call bell/phone within reach;in chair;with chair alarm set;with family/visitor present Nurse Communication: Mobility status PT Visit Diagnosis: Other abnormalities of gait and mobility (R26.89);Difficulty in walking, not elsewhere classified (R26.2)     Time: 1916-6060 PT Time Calculation (min) (ACUTE ONLY): 12 min  Charges:  $Gait Training: 8-22 mins                     Baxter Flattery, PT  Acute Rehab Dept [facility]) [phone]  [facility] Weekend Pager [facility] only)  [phone]  08/04/2022    [facility] 08/04/2022, 12:48 PM

## 2022-08-04 NOTE — Plan of Care (Signed)
All discharge instructions were given to the Pt. All questions were answered. Discussed pain management. 

## 2022-08-04 NOTE — Progress Notes (Signed)
08/04/22 1250  PT Visit Information  Last PT Received On 08/04/22 Pt meeting goals, making excellent progress. Very motivated to recover. Reviewed HEP and progression with pt and her mother. 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 PMH: neuropathy, RA, OA, DM, HTN, L THA  Subjective Data  Patient Stated Goal be able to walk with less hip pain  Precautions  Precautions Fall  Restrictions  Weight Bearing Restrictions No  Other Position/Activity Restrictions WBAT  Pain Assessment  Pain Assessment 0-10  Pain Score 3  Pain Location right hip  Pain Descriptors / Indicators Aching;Sore  Pain Intervention(s) Limited activity within patient's tolerance;Monitored during session;Premedicated before session;Repositioned;Ice applied  Cognition  Arousal/Alertness Awake/alert  Behavior During Therapy WFL for tasks assessed/performed  Overall Cognitive Status Within Functional Limits for tasks assessed  Bed Mobility  Overal bed mobility Needs Assistance  Bed Mobility Supine to Sit;Sit to Supine  Supine to sit Modified independent (Device/Increase time)  General bed mobility comments no assist  Transfers  Overall transfer level Needs assistance  Equipment used Rolling walker (2 wheels)  Transfers Sit to/from Stand  Sit to Stand Supervision;Modified independent (Device/Increase time)  General transfer comment self cues for hand placment  Ambulation/Gait  Ambulation/Gait assistance Supervision  Gait Distance (Feet) 165 Feet  Assistive device Rolling walker (2 wheels)  Gait Pattern/deviations Step-through pattern  General Gait Details no incr pain with gait, good stability, no LOB; pt reports hip feels much more stable than prior to revision  Stairs  (no steps, has ramp)  Total Joint Exercises  Ankle Circles/Pumps AROM;Both;10 reps  Quad Sets Both;10 reps;AROM  Heel Slides  AAROM;Right;10 reps  Hip ABduction/ADduction AROM;Right;10 reps  Long Arc Quad AROM;Right;15 reps;Seated  PT - End of Session  Equipment Utilized During Treatment Gait belt  Activity Tolerance Patient tolerated treatment well  Patient left with call bell/phone within reach;in chair;with chair alarm set;with family/visitor present  Nurse Communication Mobility status   PT - Assessment/Plan  PT Plan Current plan remains appropriate  PT Visit Diagnosis Other abnormalities of gait and mobility (R26.89);Difficulty in walking, not elsewhere classified (R26.2)  PT Frequency (ACUTE ONLY) 7X/week  Follow Up Recommendations Follow physician's recommendations for discharge plan and follow up therapies  Assistance recommended at discharge Set up Supervision/Assistance  Patient can return home with the following Help with stairs or ramp for entrance;Assist for transportation;Assistance with cooking/housework  PT equipment None recommended by PT  AM-PAC PT "6 Clicks" Mobility Outcome Measure (Version 2)  Help needed turning from your back to your side while in a flat bed without using bedrails? 4  Help needed moving from lying on your back to sitting on the side of a flat bed without using bedrails? 4  Help needed moving to and from a bed to a chair (including a wheelchair)? 3  Help needed standing up from a chair using your arms (e.g., wheelchair or bedside chair)? 3  Help needed to walk in hospital room? 3  Help needed climbing 3-5 steps with a railing?  3  6 Click Score 20  Consider Recommendation of Discharge To: Home with no services  Progressive Mobility  What is the highest level of mobility based on the progressive mobility assessment? Level 5 (Walks with assist in room/hall) - Balance while stepping forward/back and can walk in room with assist - Complete  Activity Ambulated with assistance in  hallway  PT Goal Progression  Progress towards PT goals Progressing toward goals  Acute Rehab PT  Goals  PT Goal Formulation With patient  Time For Goal Achievement 08/10/22  Potential to Achieve Goals Good  PT Time Calculation  PT Start Time (ACUTE ONLY) 1204  PT Stop Time (ACUTE ONLY) 1221  PT Time Calculation (min) (ACUTE ONLY) 17 min  PT General Charges  $$ ACUTE PT VISIT 1 Visit  PT Treatments  $Gait Training 8-22 mins

## 2022-08-05 DIAGNOSIS — Z792 Long term (current) use of antibiotics: Secondary | ICD-10-CM | POA: Diagnosis not present

## 2022-08-05 DIAGNOSIS — J45909 Unspecified asthma, uncomplicated: Secondary | ICD-10-CM | POA: Diagnosis not present

## 2022-08-05 DIAGNOSIS — E785 Hyperlipidemia, unspecified: Secondary | ICD-10-CM | POA: Diagnosis not present

## 2022-08-05 DIAGNOSIS — N182 Chronic kidney disease, stage 2 (mild): Secondary | ICD-10-CM | POA: Diagnosis not present

## 2022-08-05 DIAGNOSIS — M8580 Other specified disorders of bone density and structure, unspecified site: Secondary | ICD-10-CM | POA: Diagnosis not present

## 2022-08-05 DIAGNOSIS — J432 Centrilobular emphysema: Secondary | ICD-10-CM | POA: Diagnosis not present

## 2022-08-05 DIAGNOSIS — M5416 Radiculopathy, lumbar region: Secondary | ICD-10-CM | POA: Diagnosis not present

## 2022-08-05 DIAGNOSIS — E1142 Type 2 diabetes mellitus with diabetic polyneuropathy: Secondary | ICD-10-CM | POA: Diagnosis not present

## 2022-08-05 DIAGNOSIS — T84090A Other mechanical complication of internal right hip prosthesis, initial encounter: Secondary | ICD-10-CM | POA: Diagnosis not present

## 2022-08-05 DIAGNOSIS — Z96642 Presence of left artificial hip joint: Secondary | ICD-10-CM | POA: Diagnosis not present

## 2022-08-05 DIAGNOSIS — D509 Iron deficiency anemia, unspecified: Secondary | ICD-10-CM | POA: Diagnosis not present

## 2022-08-05 DIAGNOSIS — F329 Major depressive disorder, single episode, unspecified: Secondary | ICD-10-CM | POA: Diagnosis not present

## 2022-08-05 DIAGNOSIS — I129 Hypertensive chronic kidney disease with stage 1 through stage 4 chronic kidney disease, or unspecified chronic kidney disease: Secondary | ICD-10-CM | POA: Diagnosis not present

## 2022-08-05 DIAGNOSIS — Z96611 Presence of right artificial shoulder joint: Secondary | ICD-10-CM | POA: Diagnosis not present

## 2022-08-05 DIAGNOSIS — F419 Anxiety disorder, unspecified: Secondary | ICD-10-CM | POA: Diagnosis not present

## 2022-08-05 DIAGNOSIS — M069 Rheumatoid arthritis, unspecified: Secondary | ICD-10-CM | POA: Diagnosis not present

## 2022-08-05 DIAGNOSIS — G8929 Other chronic pain: Secondary | ICD-10-CM | POA: Diagnosis not present

## 2022-08-05 DIAGNOSIS — K219 Gastro-esophageal reflux disease without esophagitis: Secondary | ICD-10-CM | POA: Diagnosis not present

## 2022-08-05 DIAGNOSIS — Z7984 Long term (current) use of oral hypoglycemic drugs: Secondary | ICD-10-CM | POA: Diagnosis not present

## 2022-08-05 DIAGNOSIS — Z7982 Long term (current) use of aspirin: Secondary | ICD-10-CM | POA: Diagnosis not present

## 2022-08-05 DIAGNOSIS — E669 Obesity, unspecified: Secondary | ICD-10-CM | POA: Diagnosis not present

## 2022-08-05 DIAGNOSIS — Z7951 Long term (current) use of inhaled steroids: Secondary | ICD-10-CM | POA: Diagnosis not present

## 2022-08-05 DIAGNOSIS — M1990 Unspecified osteoarthritis, unspecified site: Secondary | ICD-10-CM | POA: Diagnosis not present

## 2022-08-05 DIAGNOSIS — E1122 Type 2 diabetes mellitus with diabetic chronic kidney disease: Secondary | ICD-10-CM | POA: Diagnosis not present

## 2022-08-06 DIAGNOSIS — Z09 Encounter for follow-up examination after completed treatment for conditions other than malignant neoplasm: Secondary | ICD-10-CM | POA: Diagnosis not present

## 2022-08-06 DIAGNOSIS — Z9889 Other specified postprocedural states: Secondary | ICD-10-CM | POA: Diagnosis not present

## 2022-08-08 LAB — AEROBIC/ANAEROBIC CULTURE W GRAM STAIN (SURGICAL/DEEP WOUND)
Culture: NO GROWTH
Gram Stain: NONE SEEN

## 2022-08-11 DIAGNOSIS — T84090D Other mechanical complication of internal right hip prosthesis, subsequent encounter: Secondary | ICD-10-CM | POA: Diagnosis not present

## 2022-08-11 DIAGNOSIS — I10 Essential (primary) hypertension: Secondary | ICD-10-CM | POA: Diagnosis not present

## 2022-08-14 DIAGNOSIS — Z96641 Presence of right artificial hip joint: Secondary | ICD-10-CM | POA: Diagnosis not present

## 2022-08-14 DIAGNOSIS — Z471 Aftercare following joint replacement surgery: Secondary | ICD-10-CM | POA: Diagnosis not present

## 2022-08-20 DIAGNOSIS — M6281 Muscle weakness (generalized): Secondary | ICD-10-CM | POA: Diagnosis not present

## 2022-08-20 DIAGNOSIS — M25651 Stiffness of right hip, not elsewhere classified: Secondary | ICD-10-CM | POA: Diagnosis not present

## 2022-08-20 DIAGNOSIS — Z96641 Presence of right artificial hip joint: Secondary | ICD-10-CM | POA: Diagnosis not present

## 2022-09-02 ENCOUNTER — Encounter: Payer: Self-pay | Admitting: Podiatry

## 2022-09-02 ENCOUNTER — Ambulatory Visit: Payer: HMO | Admitting: Podiatry

## 2022-09-02 DIAGNOSIS — B351 Tinea unguium: Secondary | ICD-10-CM | POA: Diagnosis not present

## 2022-09-02 DIAGNOSIS — E119 Type 2 diabetes mellitus without complications: Secondary | ICD-10-CM | POA: Diagnosis not present

## 2022-09-02 DIAGNOSIS — L84 Corns and callosities: Secondary | ICD-10-CM | POA: Diagnosis not present

## 2022-09-02 DIAGNOSIS — M79674 Pain in right toe(s): Secondary | ICD-10-CM | POA: Diagnosis not present

## 2022-09-02 DIAGNOSIS — M79675 Pain in left toe(s): Secondary | ICD-10-CM

## 2022-09-02 DIAGNOSIS — Q828 Other specified congenital malformations of skin: Secondary | ICD-10-CM

## 2022-09-02 NOTE — Progress Notes (Signed)
  Subjective:  Patient ID: [name], female    DOB: 1945/12/04,  MRN: [id number]  [name] presents to clinic today for:  Chief Complaint  Patient presents with   Nail Problem    Diabetic foot care BS-95 A1C-5.7 PCP-Ramachandran PCP VST-07/23/2022   New problem(s): None.   PCP is Merrilee Seashore, MD.  Allergies  Allergen Reactions   Ivp Dye [Iodinated Contrast Media] Nausea And Vomiting   Prednisone Other (See Comments)    reflux   Review of Systems: Negative except as noted in the HPI.  Objective: No changes noted in today's physical examination.  [name] is a pleasant 76 y.o. female WD, WN in NAD. AAO x 3.  Vascular Examination: CFT immediate b/l LE. Palpable DP/PT pulses b/l LE. Digital hair sparse b/l. Skin temperature gradient WNL b/l. No pain with calf compression b/l. No edema noted b/l. No cyanosis or clubbing noted b/l LE.  Dermatological Examination: Pedal skin is warm and supple b/l LE. No open wounds b/l LE. No interdigital macerations noted b/l LE. Anonychia noted right great toe. Nailbed(s) epithelialized.  Toenails left hallux and 2-5 bilaterally severely elongated, thickened with discoloration, also possessing excessive curvature and impinging onto the distal tips of the digits. No erythema, no edema, no drainage, no fluctuance.   Hyperkeratotic lesion(s) L hallux. No erythema, no edema, no drainage, no fluctuance. Porokeratotic lesion(s) L 5th toe. No erythema, no edema, no drainage, no fluctuance.  Musculoskeletal Examination: Muscle strength 5/5 to all lower extremity muscle groups bilaterally. No pain, crepitus or joint limitation noted with ROM bilateral LE. No gross bony deformities bilaterally.  Neurological Examination: Protective sensation intact 5/5 intact bilaterally with 10g monofilament b/l. Vibratory sensation intact b/l. Assessment/Plan: 1. Pain due to onychomycosis of toenails of both feet   2. Callus   3. Porokeratosis    4. Controlled type 2 diabetes mellitus without complication, without long-term current use of insulin ([place])     No orders of the defined types were placed in this encounter.   -Patient was evaluated and treated. All patient's and/or POA's questions/concerns answered on today's visit. -No new findings. No new orders. -Mycotic toenails 2-5 bilaterally and left great toe were debrided in length and girth with sterile nail nippers and dremel without iatrogenic bleeding. -Callus(es) medial IPJ of left great toe pared utilizing sterile scalpel blade without complication or incident. Total number debrided =1. -Porokeratotic lesion(s) left fifth digit pared and enucleated with sterile currette without incident. Total number of lesions debrided=1. -Patient/POA to call should there be question/concern in the interim.   Return in about 3 months (around 12/03/2022).  Marzetta Board, DPM

## 2022-09-03 DIAGNOSIS — M1711 Unilateral primary osteoarthritis, right knee: Secondary | ICD-10-CM | POA: Diagnosis not present

## 2022-09-28 ENCOUNTER — Ambulatory Visit (INDEPENDENT_AMBULATORY_CARE_PROVIDER_SITE_OTHER): Payer: HMO

## 2022-09-28 ENCOUNTER — Encounter: Payer: Self-pay | Admitting: Infectious Disease

## 2022-09-28 ENCOUNTER — Other Ambulatory Visit: Payer: Self-pay

## 2022-09-28 ENCOUNTER — Ambulatory Visit (INDEPENDENT_AMBULATORY_CARE_PROVIDER_SITE_OTHER): Payer: HMO | Admitting: Infectious Disease

## 2022-09-28 VITALS — BP 118/68 | HR 82 | Temp 97.9°F | Ht <= 58 in | Wt 148.0 lb

## 2022-09-28 DIAGNOSIS — Z96643 Presence of artificial hip joint, bilateral: Secondary | ICD-10-CM

## 2022-09-28 DIAGNOSIS — T84010D Broken internal right hip prosthesis, subsequent encounter: Secondary | ICD-10-CM | POA: Diagnosis not present

## 2022-09-28 DIAGNOSIS — M4620 Osteomyelitis of vertebra, site unspecified: Secondary | ICD-10-CM

## 2022-09-28 DIAGNOSIS — Z7185 Encounter for immunization safety counseling: Secondary | ICD-10-CM

## 2022-09-28 DIAGNOSIS — M069 Rheumatoid arthritis, unspecified: Secondary | ICD-10-CM

## 2022-09-28 DIAGNOSIS — T84018D Broken internal joint prosthesis, other site, subsequent encounter: Secondary | ICD-10-CM

## 2022-09-28 DIAGNOSIS — Z23 Encounter for immunization: Secondary | ICD-10-CM | POA: Diagnosis not present

## 2022-09-28 DIAGNOSIS — M4624 Osteomyelitis of vertebra, thoracic region: Secondary | ICD-10-CM | POA: Diagnosis not present

## 2022-09-28 MED ORDER — CEFADROXIL 500 MG PO CAPS: 1000.0000 mg | ORAL_CAPSULE | Freq: Two times a day (BID) | ORAL | 1 refills | Status: DC

## 2022-09-28 NOTE — Progress Notes (Signed)
Subjective:  Chief complaint:  some loose bowel movements     Patient ID: [name], female    DOB: [birth year], 76 y.o.   MRN: [id number]  HPI     76 year-old black woman with a past medical history significant for rheumatoid arthritis having been on etanercept and methotrexate diabetes mellitus hypertension hyperlipidemia who has had bilateral hip replacements lumbar laminectomy decompression and microdiscectomy in 2019 prior bilateral septic shoulders and MSSA bacteremia treated in 2012 or was it 2004 per patient and daughter with 6 weeks of cefazolin after successful surgery.  She presented to the ER on 25 January 2022   with acute on chronic worsening of her upper thoracic back pain.  Then seen by PCP and noted to be confused came to the ER with worsening fevers and chills.   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  She had initially been on broad-spectrum antibiotics in the form of vancomycin and cefepime and then ceftriaxone.  Blood cultures were without growth and IR guided aspirate of the disc space yielded methicillin sensitive Staphylococcus aureus.   There additionally been concern for possible cervical spine disease.  MRI of her thoracic spine was performed on May 4 which showed progressive findings in the thoracic spine from T8-T10 with progression of osteomyelitis with inflammation and spinal cord compression maximal at T8 with continued presence of an epidural phlegmon and dural thickening and enhancement.  She also had more degeneration of her thoracic spine 10 through 11.  MRI of the cervical spine fortunately did not show infectious pathology.  She was taken to the operating room by Dr. Wendee Copp T7, T8, T9, T10 and T11 laminectomy for drainage of epidural abscess; T7-8, T8-9, T9-10, T10-11 posterior lateral arthrodesis with local morselized autograft bone    Cultures were unrevealing but she had been on  antibiotics.  The clock was restarted on IV antibiotics and she was placed on cefazolin.  She was dramatically better after surgery.  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 Mayer Camel is going to need to perform surgery to place at least a component of it.  She has successfully undergone surgery for this.  She is tolerating antibiotics relatively well though she does have loose stools appears while she is on the cefadroxil.  She is comfortable staying on it for now.  Her back pain is stable.        Past Medical History:  Diagnosis Date   Anxiety    Arthritis    "knees, ankles, fingers" (07/06/2018)   Asthma    Chronic lower back pain    Dyspnea    with asthma attacks    Dysrhythmia    Family history of adverse reaction to anesthesia    my first cousin had difficulty waking up    Fibroid    ovarian   GERD (gastroesophageal reflux disease)    Hip osteoarthritis    Left   History of staph infection    in hospital for 11 days/ in 2007   Hyperlipidemia    Hypertension    Radiculopathy    Tremor 03/30/2022   Type II diabetes mellitus ([place])    Upper extremity weakness 03/30/2022   Wears glasses     Past Surgical History:  Procedure Laterality Date   ANTERIOR CERVICAL DECOMP/DISCECTOMY FUSION N/A 12/11/2015   Procedure: ANTERIOR CERVICAL DECOMPRESSION/DISCECTOMY FUSION 2 LEVELS;  Surgeon: Phylliss Bob, MD;  Location: [facility];  Service: Orthopedics;  Laterality: N/A;  Anterior cervical decompression fusion, cervical 6-7, cervical 7-thoracic 1 with instrumentation and allograft   BACK SURGERY     CARDIAC CATHETERIZATION  1998   CATARACT EXTRACTION W/ INTRAOCULAR LENS  IMPLANT, BILATERAL Bilateral 2015   [place]   COLONOSCOPY     CRYOABLATION  1988   "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"   INCISION AND DRAINAGE Bilateral 2007>   "cleaned staph out of shoulders"   IR FLUORO GUIDED NEEDLE PLC ASPIRATION/INJECTION  LOC  01/27/2022   IR FLUORO GUIDED NEEDLE PLC ASPIRATION/INJECTION LOC  01/27/2022   JOINT REPLACEMENT     LAPAROSCOPIC OVARIAN CYSTECTOMY  1970   LUMBAR LAMINECTOMY/DECOMPRESSION MICRODISCECTOMY N/A 01/31/2018   Procedure: LAMINECTOMY AND FORAMINOTOMY LUMBAR TWO- LUMBAR THREE, LUMBAR THREE- LUMBAR FOUR, LUMBAR FOUR- LUMBAR FIVE ;  Surgeon: Newman Pies, MD;  Location: [facility];  Service: Neurosurgery;  Laterality: N/A;   LUMBAR LAMINECTOMY/DECOMPRESSION MICRODISCECTOMY N/A 04/13/2022   Procedure: Thoracic seven,Thoracic eight, Thoracic nine, Thoracic ten LAMINECTOMY;  Surgeon: Newman Pies, MD;  Location: [facility];  Service: Neurosurgery;  Laterality: N/A;   POSTERIOR CERVICAL LAMINECTOMY  ~ [place] Right 02/2003   THUMB FUSION Right 2010   right thumb   TONSILLECTOMY     TOTAL ABDOMINAL HYSTERECTOMY  1998   TAH,BSO   TOTAL HIP ARTHROPLASTY Right 05/18/2018   Procedure: TOTAL HIP ARTHROPLASTY ANTERIOR APPROACH;  Surgeon: Frederik Pear, MD;  Location: [facility];  Service: Orthopedics;  Laterality: Right;   TOTAL HIP ARTHROPLASTY Left 07/06/2018   TOTAL HIP ARTHROPLASTY Left 07/06/2018   Procedure: LEFT TOTAL HIP ARTHROPLASTY ANTERIOR APPROACH;  Surgeon: Frederik Pear, MD;  Location: [facility];  Service: Orthopedics;  Laterality: Left;  Needs RNFA   TOTAL HIP REVISION Right 08/03/2022   Procedure: TOTAL HIP REVISION;  Surgeon: Frederik Pear, MD;  Location: [facility];  Service: Orthopedics;  Laterality: Right;   TUBAL LIGATION      Family History  Problem Relation Age of Onset   Diabetes Sister    Diabetes Brother    Diabetes Brother    Diabetes Paternal Uncle    Cancer Paternal Aunt        UTERINE   Hypertension Maternal Grandmother    Heart disease Maternal Grandmother    Colon cancer Neg Hx       Social History   Socioeconomic History   Marital status: Married    Spouse name: Not on file   Number of children: Not on file   Years of education: Not on file    Highest education level: Not on file  Occupational History   Not on file  Tobacco Use   Smoking status: Former    Packs/day: 1.00    Years: 25.00    Total pack years: 25.00    Types: Cigarettes    Quit date: 11/23/1992    Years since quitting: 29.8   Smokeless tobacco: Never  Vaping Use   Vaping Use: Never used  Substance and Sexual Activity   Alcohol use: Not Currently   Drug use: Never   Sexual activity: Not Currently  Other Topics Concern   Not on file  Social History Narrative   Not on file   Social Determinants of Health   Financial Resource Strain: Not on file  Food Insecurity: No Food Insecurity (05/20/2020)   Hunger Vital Sign    Worried About Running Out of Food in the Last Year:  Never true    Ran Out of Food in the Last Year: Never true  Transportation Needs: No Transportation Needs (05/20/2020)   PRAPARE - Hydrologist (Medical): No    Lack of Transportation (Non-Medical): No  Physical Activity: Not on file  Stress: Not on file  Social Connections: Not on file    Allergies  Allergen Reactions   Ivp Dye [Iodinated Contrast Media] Nausea And Vomiting   Prednisone Other (See Comments)    reflux     Current Outpatient Medications:    albuterol (ACCUNEB) 1.25 MG/3ML nebulizer solution, Take 1 ampule by nebulization every 4 (four) hours as needed for wheezing., Disp: , Rfl:    albuterol (VENTOLIN HFA) 108 (90 Base) MCG/ACT inhaler, Inhale 2 puffs into the lungs every 6 (six) hours as needed for wheezing or shortness of breath., Disp: , Rfl:    aspirin EC 81 MG tablet, Take 1 tablet (81 mg total) by mouth 2 (two) times daily., Disp: 60 tablet, Rfl: 0   Blood Glucose Monitoring Suppl (ONE TOUCH ULTRA 2) w/Device KIT, Check blood sugar twice per week, Disp: , Rfl:    bumetanide (BUMEX) 1 MG tablet, Take 1 mg by mouth 2 (two) times daily., Disp: , Rfl:    cefadroxil (DURICEF) 500 MG capsule, Take 2 capsules (1,000 mg total) by mouth 2  (two) times daily., Disp: 360 capsule, Rfl: 1   cetirizine (ZYRTEC) 10 MG tablet, Take 10 mg by mouth daily., Disp: , Rfl:    cholecalciferol (VITAMIN D3) 25 MCG (1000 UNIT) tablet, Take 1,000 Units by mouth daily., Disp: , Rfl:    diclofenac Sodium (VOLTAREN) 1 % GEL, Apply 4 g topically 4 (four) times daily. (Patient taking differently: Apply 4 g topically daily as needed (pain).), Disp: 100 g, Rfl: 0   diltiazem (CARDIZEM CD) 180 MG 24 hr capsule, Take 180 mg by mouth daily., Disp: , Rfl:    enalapril (VASOTEC) 20 MG tablet, Take 20 mg by mouth 2 (two) times daily., Disp: , Rfl:    folic acid (FOLVITE) 810 MCG tablet, Take 800 mcg by mouth daily., Disp: , Rfl:    gabapentin (NEURONTIN) 300 MG capsule, Take 300 mg by mouth at bedtime., Disp: , Rfl:    glucose blood test strip, , Disp: , Rfl:    hydrOXYzine (ATARAX) 10 MG tablet, Take 10-20 mg by mouth 2 (two) times daily as needed for anxiety., Disp: , Rfl:    Lancets (ONETOUCH DELICA PLUS [id number]) MISC, Use to check blood sugar, Disp: , Rfl:    lovastatin (MEVACOR) 40 MG tablet, Take 40 mg by mouth at bedtime. , Disp: , Rfl:    meclizine (ANTIVERT) 12.5 MG tablet, Take 12.5 mg by mouth 2 (two) times daily as needed for dizziness., Disp: , Rfl:    metFORMIN (GLUCOPHAGE-XR) 500 MG 24 hr tablet, Take 500 mg by mouth in the morning and at bedtime., Disp: , Rfl:    Multiple Vitamin (MULTIVITAMIN WITH MINERALS) TABS tablet, Take 1 tablet by mouth daily., Disp: , Rfl:    omeprazole (PRILOSEC OTC) 20 MG tablet, Take 20 mg by mouth daily as needed (heartburn)., Disp: , Rfl:    ONETOUCH ULTRA test strip, , Disp: , Rfl:    oxyCODONE-acetaminophen (PERCOCET/ROXICET) 5-325 MG tablet, Take 1 tablet by mouth every 4 (four) hours as needed for severe pain., Disp: 30 tablet, Rfl: 0   sodium chloride (OCEAN) 0.65 % SOLN nasal spray, Place 1-2 sprays into the nose  4 (four) times daily as needed for congestion., Disp: , Rfl:    tiZANidine (ZANAFLEX) 2 MG tablet,  Take 1 tablet (2 mg total) by mouth every 6 (six) hours as needed., Disp: 60 tablet, Rfl: 0   TRELEGY ELLIPTA 200-62.5-25 MCG/ACT AEPB, Take 1 puff by mouth daily., Disp: , Rfl:     Review of Systems  Constitutional:  Negative for activity change, appetite change, chills, diaphoresis, fatigue, fever and unexpected weight change.  HENT:  Negative for congestion, rhinorrhea, sinus pressure, sneezing, sore throat and trouble swallowing.   Eyes:  Negative for photophobia and visual disturbance.  Respiratory:  Negative for cough, chest tightness, shortness of breath, wheezing and stridor.   Cardiovascular:  Negative for chest pain, palpitations and leg swelling.  Gastrointestinal:  Positive for diarrhea. Negative for abdominal distention, abdominal pain, anal bleeding, blood in stool, constipation, nausea and vomiting.  Genitourinary:  Negative for difficulty urinating, dysuria, flank pain and hematuria.  Musculoskeletal:  Negative for arthralgias, back pain, gait problem, joint swelling and myalgias.  Skin:  Negative for color change, pallor, rash and wound.  Neurological:  Negative for dizziness, tremors, weakness and light-headedness.  Hematological:  Negative for adenopathy. Does not bruise/bleed easily.  Psychiatric/Behavioral:  Negative for agitation, behavioral problems, confusion, decreased concentration, dysphoric mood and sleep disturbance.        Objective:   Physical Exam Constitutional:      General: She is not in acute distress.    Appearance: Normal appearance. She is well-developed. She is not ill-appearing or diaphoretic.  HENT:     Head: Normocephalic and atraumatic.     Right Ear: Hearing and external ear normal.     Left Ear: Hearing and external ear normal.     Nose: No nasal deformity or rhinorrhea.  Eyes:     General: No scleral icterus.    Conjunctiva/sclera: Conjunctivae normal.     Right eye: Right conjunctiva is not injected.     Left eye: Left conjunctiva is  not injected.     Pupils: Pupils are equal, round, and reactive to light.  Neck:     Vascular: No JVD.  Cardiovascular:     Rate and Rhythm: Normal rate and regular rhythm.     Heart sounds: S1 normal and S2 normal.  Pulmonary:     Effort: Pulmonary effort is normal. No respiratory distress.     Breath sounds: No wheezing.  Abdominal:     General: Bowel sounds are normal. There is no distension.     Palpations: Abdomen is soft.     Tenderness: There is no abdominal tenderness.  Musculoskeletal:        General: Normal range of motion.     Right shoulder: Normal.     Left shoulder: Normal.     Cervical back: Normal range of motion and neck supple.     Right hip: Normal.     Left hip: Normal.     Right knee: Normal.     Left knee: Normal.  Lymphadenopathy:     Head:     Right side of head: No submandibular, preauricular or posterior auricular adenopathy.     Left side of head: No submandibular, preauricular or posterior auricular adenopathy.     Cervical: No cervical adenopathy.     Right cervical: No superficial or deep cervical adenopathy.    Left cervical: No superficial or deep cervical adenopathy.  Skin:    General: Skin is warm and dry.     Coloration:  Skin is not pale.     Findings: No abrasion, bruising, ecchymosis, erythema, lesion or rash.     Nails: There is no clubbing.  Neurological:     General: No focal deficit present.     Mental Status: She is alert and oriented to person, place, and time.     Sensory: No sensory deficit.     Coordination: Coordination normal.     Gait: Gait normal.  Psychiatric:        Attention and Perception: She is attentive.        Mood and Affect: Mood normal.        Speech: Speech normal.        Behavior: Behavior normal. Behavior is cooperative.        Thought Content: Thought content normal.        Judgment: Judgment normal.           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

## 2022-09-29 LAB — CBC WITH DIFFERENTIAL/PLATELET
Absolute Monocytes: 432 cells/uL (ref 200–950)
Basophils Absolute: 72 cells/uL (ref 0–200)
Basophils Relative: 1 %
Eosinophils Absolute: 209 cells/uL (ref 15–500)
Eosinophils Relative: 2.9 %
HCT: 32.5 % — ABNORMAL LOW (ref 35.0–45.0)
Hemoglobin: 10.4 g/dL — ABNORMAL LOW (ref 11.7–15.5)
Lymphs Abs: 1850 cells/uL (ref 850–3900)
MCH: 25.5 pg — ABNORMAL LOW (ref 27.0–33.0)
MCHC: 32 g/dL (ref 32.0–36.0)
MCV: 79.7 fL — ABNORMAL LOW (ref 80.0–100.0)
MPV: 11.2 fL (ref 7.5–12.5)
Monocytes Relative: 6 %
Neutro Abs: 4637 cells/uL (ref 1500–7800)
Neutrophils Relative %: 64.4 %
Platelets: 375 10*3/uL (ref 140–400)
RBC: 4.08 10*6/uL (ref 3.80–5.10)
RDW: 15 % (ref 11.0–15.0)
Total Lymphocyte: 25.7 %
WBC: 7.2 10*3/uL (ref 3.8–10.8)

## 2022-09-29 LAB — SEDIMENTATION RATE: Sed Rate: 19 mm/h (ref 0–30)

## 2022-09-29 LAB — BASIC METABOLIC PANEL WITH GFR
BUN: 23 mg/dL (ref 7–25)
CO2: 26 mmol/L (ref 20–32)
Calcium: 9.5 mg/dL (ref 8.6–10.4)
Chloride: 108 mmol/L (ref 98–110)
Creat: 0.85 mg/dL (ref 0.60–1.00)
Glucose, Bld: 120 mg/dL — ABNORMAL HIGH (ref 65–99)
Potassium: 4 mmol/L (ref 3.5–5.3)
Sodium: 145 mmol/L (ref 135–146)
eGFR: 72 mL/min/{1.73_m2} (ref >=60)

## 2022-09-29 LAB — C-REACTIVE PROTEIN: CRP: 3.4 mg/L (ref <8.0)

## 2022-09-30 ENCOUNTER — Ambulatory Visit: Payer: PPO | Admitting: Infectious Disease

## 2022-10-02 IMAGING — CR DG THORACIC SPINE 2V
2 series · 2 of 2 positions shown · non-contrast
Comparison: MRI thoracic spine 03/26/2022

CLINICAL DATA: Localization films for T7 through T10 laminectomy.

EXAM:
THORACIC SPINE 2 VIEWS

[AP (1 of 2)]
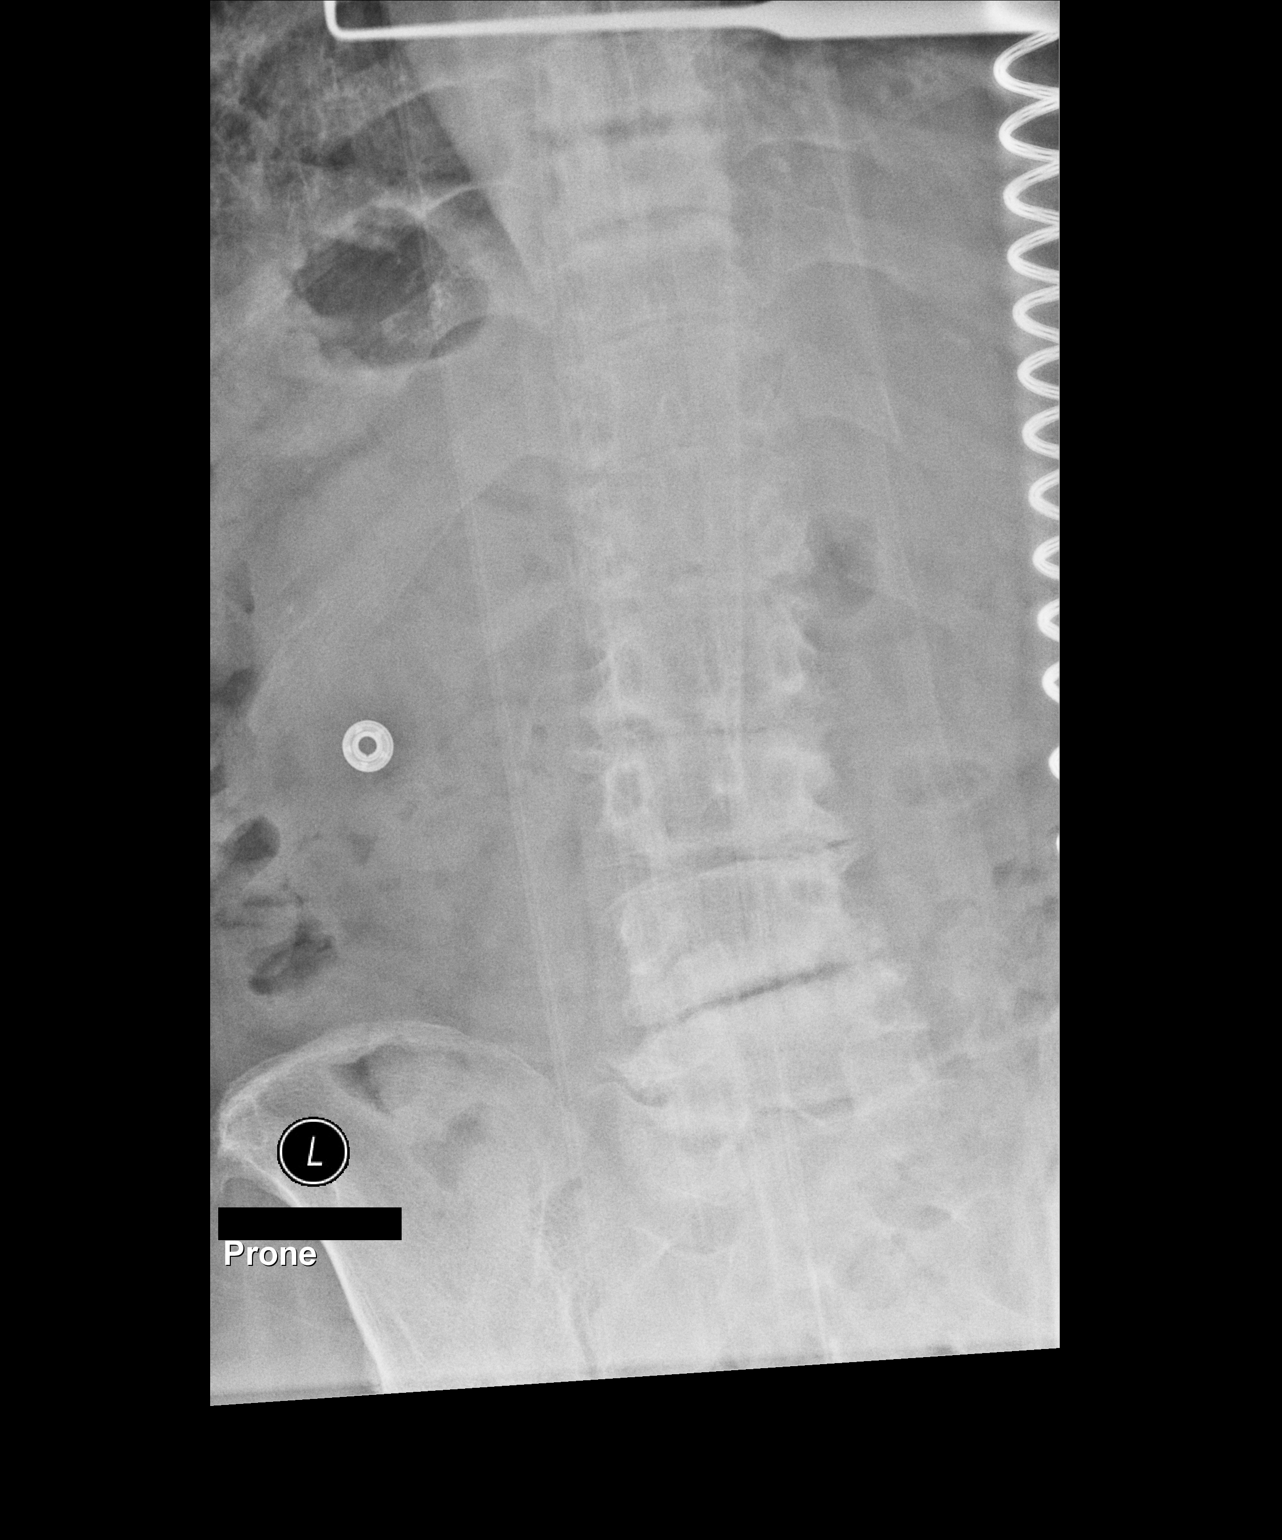

[AP (2 of 2)]
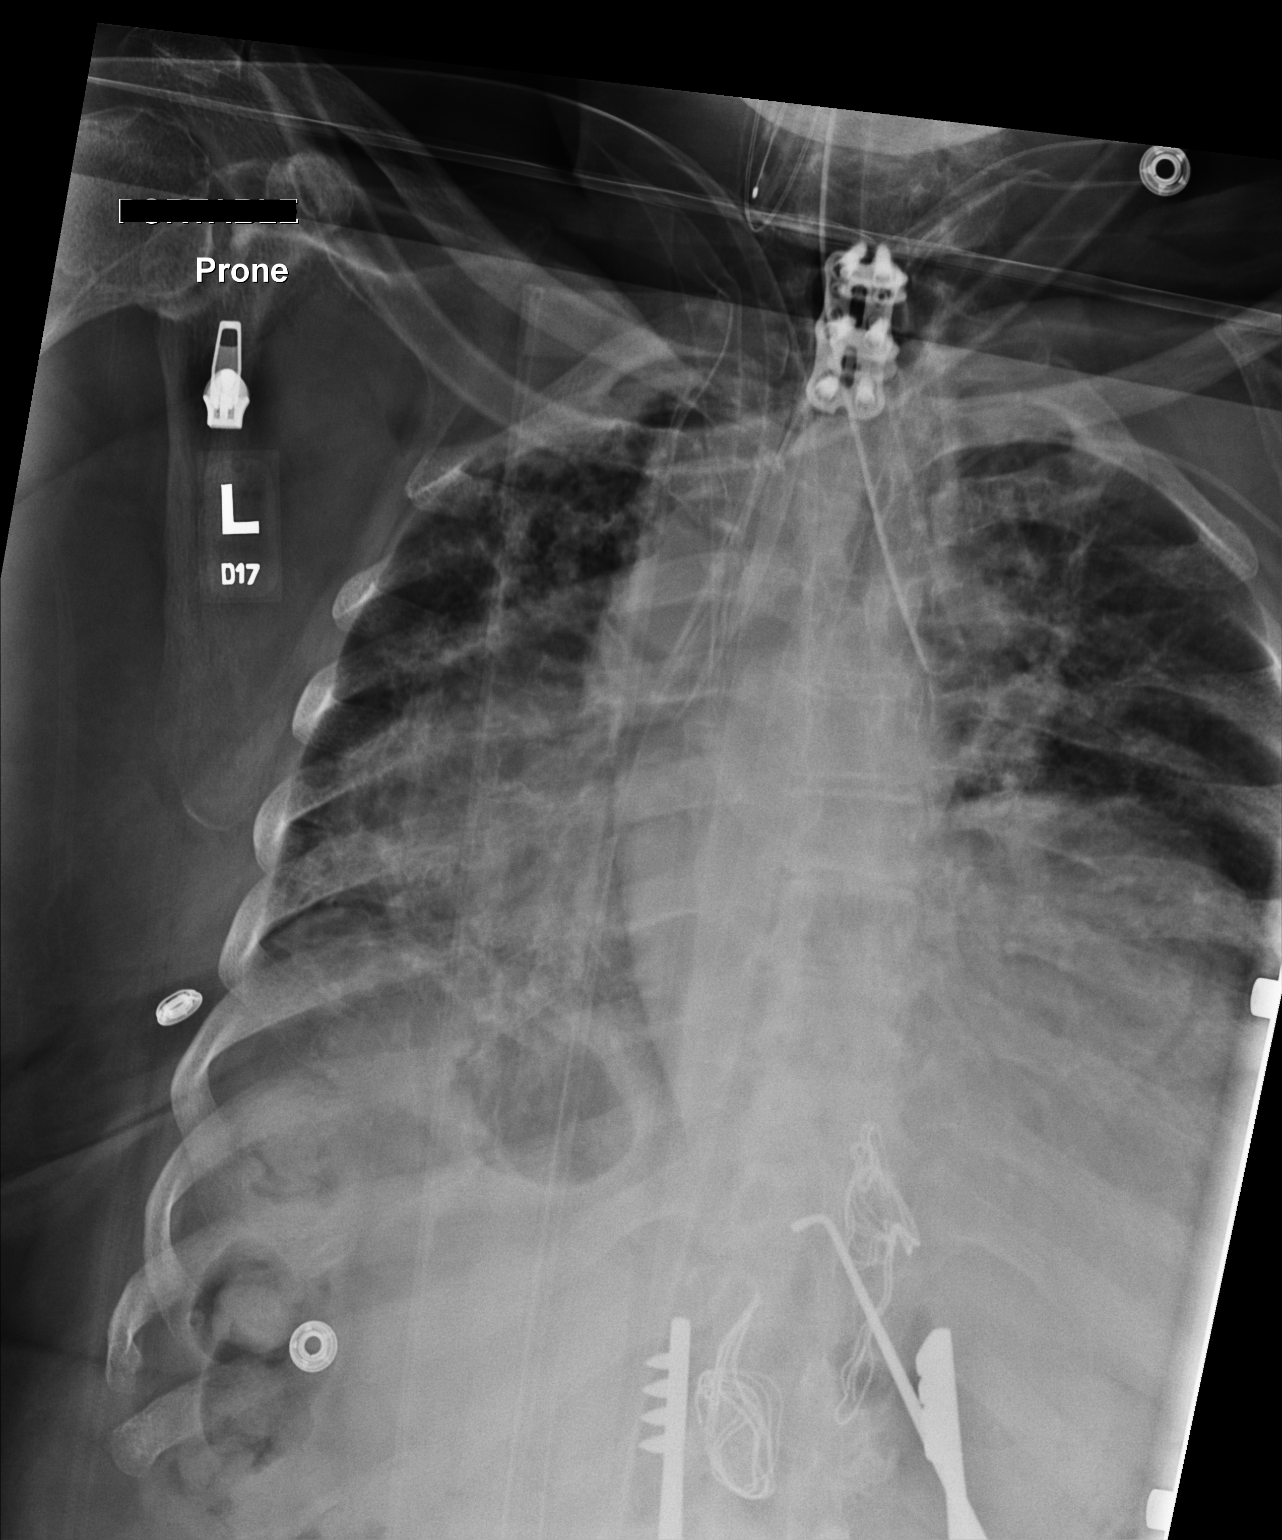

[2 of 2 positions shown; findings below may reference images not displayed]

FINDINGS: Two prone intraoperative images are provided, including the chest
and abdomen.

On the chest image, an endotracheal tube is present. The tip appears
to be in the left mainstem bronchus and can be withdrawn about 2 cm.
There is suggestion of atelectasis in the right lung base.

There appear to be 4 true lumbar type vertebral bodies with 12
rib-bearing thoracic vertebrae. On the chest view, a localization
marker is projected over the T10-11 interspace with retractors and
sponge markers present. On the abdominal view, a localization marker
is projected horizontally over the T7-8 interspace.

There are diffuse degenerative changes shown throughout the thoracic
and lumbar spine.
IMPRESSION: 1. Intraoperative localization views are obtained with localization
markers projected over the T10-11 and T7-8 interspaces.
2. Note 4 true lumbar type vertebral bodies with 12 rib-bearing
thoracic vertebrae.
3. Endotracheal tube tip appears to be in the left mainstem
bronchus.

These results were called by telephone at the time of interpretation
on 04/13/2022 at [DATE] to provider Eeckhout in the OR , who verbally
acknowledged these results.

## 2022-11-24 DIAGNOSIS — M25651 Stiffness of right hip, not elsewhere classified: Secondary | ICD-10-CM | POA: Diagnosis not present

## 2022-12-09 DIAGNOSIS — E11618 Type 2 diabetes mellitus with other diabetic arthropathy: Secondary | ICD-10-CM | POA: Diagnosis not present

## 2022-12-09 DIAGNOSIS — M0600 Rheumatoid arthritis without rheumatoid factor, unspecified site: Secondary | ICD-10-CM | POA: Diagnosis not present

## 2022-12-09 DIAGNOSIS — Z7984 Long term (current) use of oral hypoglycemic drugs: Secondary | ICD-10-CM | POA: Diagnosis not present

## 2022-12-09 DIAGNOSIS — Z9989 Dependence on other enabling machines and devices: Secondary | ICD-10-CM | POA: Diagnosis not present

## 2022-12-09 DIAGNOSIS — Z96641 Presence of right artificial hip joint: Secondary | ICD-10-CM | POA: Diagnosis not present

## 2022-12-09 DIAGNOSIS — I10 Essential (primary) hypertension: Secondary | ICD-10-CM | POA: Diagnosis not present

## 2022-12-15 DIAGNOSIS — H35033 Hypertensive retinopathy, bilateral: Secondary | ICD-10-CM | POA: Diagnosis not present

## 2022-12-15 DIAGNOSIS — H524 Presbyopia: Secondary | ICD-10-CM | POA: Diagnosis not present

## 2022-12-15 DIAGNOSIS — H04123 Dry eye syndrome of bilateral lacrimal glands: Secondary | ICD-10-CM | POA: Diagnosis not present

## 2022-12-15 DIAGNOSIS — H40013 Open angle with borderline findings, low risk, bilateral: Secondary | ICD-10-CM | POA: Diagnosis not present

## 2022-12-15 DIAGNOSIS — E119 Type 2 diabetes mellitus without complications: Secondary | ICD-10-CM | POA: Diagnosis not present

## 2022-12-16 ENCOUNTER — Ambulatory Visit: Payer: Self-pay | Admitting: Infectious Disease

## 2022-12-23 DIAGNOSIS — Z1231 Encounter for screening mammogram for malignant neoplasm of breast: Secondary | ICD-10-CM | POA: Diagnosis not present

## 2022-12-28 ENCOUNTER — Ambulatory Visit (INDEPENDENT_AMBULATORY_CARE_PROVIDER_SITE_OTHER): Payer: PPO | Admitting: Podiatry

## 2022-12-28 VITALS — BP 157/80

## 2022-12-28 DIAGNOSIS — M79675 Pain in left toe(s): Secondary | ICD-10-CM

## 2022-12-28 DIAGNOSIS — B351 Tinea unguium: Secondary | ICD-10-CM | POA: Diagnosis not present

## 2022-12-28 DIAGNOSIS — L84 Corns and callosities: Secondary | ICD-10-CM | POA: Diagnosis not present

## 2022-12-28 DIAGNOSIS — Q828 Other specified congenital malformations of skin: Secondary | ICD-10-CM

## 2022-12-28 DIAGNOSIS — E119 Type 2 diabetes mellitus without complications: Secondary | ICD-10-CM | POA: Diagnosis not present

## 2022-12-28 DIAGNOSIS — M79674 Pain in right toe(s): Secondary | ICD-10-CM | POA: Diagnosis not present

## 2022-12-29 ENCOUNTER — Encounter: Payer: Self-pay | Admitting: Podiatry

## 2022-12-29 NOTE — Progress Notes (Signed)
  Subjective:  Patient ID: [name], female    DOB: Jul 27, 1946,  MRN: [id number]  [name] presents to clinic today for preventative diabetic foot care and callus(es) left foot, porokeratotic lesion(s) left lower extremity, and painful mycotic nails. Painful toenails interfere with ambulation. Aggravating factors include wearing enclosed shoe gear. Pain is relieved with periodic professional debridement. Painful callus(es) and porokeratotic lesion(s) are aggravated when weightbearing with and without shoegear. Pain is relieved with periodic professional debridement.  Chief Complaint  Patient presents with   Nail Problem    [facility] [id number] yesterday A1C-5.9 PCP-Ramachandran PCP VST-06/2023   New problem(s): None.   PCP is Merrilee Seashore, MD.  Allergies  Allergen Reactions   Ivp Dye [Iodinated Contrast Media] Nausea And Vomiting   Prednisone Other (See Comments)    reflux    Review of Systems: Negative except as noted in the HPI.  Objective: No changes noted in today's physical examination. Vitals:   12/28/22 1717  BP: (!) 157/80   [name] is a pleasant 77 y.o. female WD, WN in NAD. AAO x 3. Vascular Examination: CFT immediate b/l LE. Palpable DP/PT pulses b/l LE. Digital hair sparse b/l. Skin temperature gradient WNL b/l. No pain with calf compression b/l. No edema noted b/l. No cyanosis or clubbing noted b/l LE.  Dermatological Examination: Pedal skin is warm and supple b/l LE. No open wounds b/l LE. No interdigital macerations noted b/l LE. Anonychia noted right great toe. Nailbed(s) epithelialized.  Toenails left hallux and 2-5 bilaterally severely elongated, thickened with discoloration, also possessing excessive curvature and impinging onto the distal tips of the digits. No erythema, no edema, no drainage, no fluctuance.   Hyperkeratotic lesion(s) L hallux. No erythema, no edema, no drainage, no fluctuance. Porokeratotic lesion(s) L 5th toe. No erythema, no  edema, no drainage, no fluctuance.  Musculoskeletal Examination: Muscle strength 5/5 to all lower extremity muscle groups bilaterally. No pain, crepitus or joint limitation noted with ROM bilateral LE. No gross bony deformities bilaterally.  Neurological Examination: Protective sensation intact 5/5 intact bilaterally with 10g monofilament b/l. Vibratory sensation intact b/l. Assessment/Plan: 1. Pain due to onychomycosis of toenails of both feet   2. Callus   3. Porokeratosis   4. Controlled type 2 diabetes mellitus without complication, without long-term current use of insulin ([place])     -Consent given for treatment as described below: -Examined patient. -Toenails 2-5 bilaterally debrided in length and girth without iatrogenic bleeding with sterile nail nipper and dremel.  -Callus(es) L hallux pared utilizing sterile scalpel blade without complication or incident. Total number debrided =1. -Porokeratotic lesion(s) L 5th toe pared and enucleated with sterile currette without incident. Total number of lesions debrided=1. -Patient/POA to call should there be question/concern in the interim.   Return in about 3 months (around 03/28/2023).  Marzetta Board, DPM

## 2023-01-20 ENCOUNTER — Encounter: Payer: Self-pay | Admitting: Infectious Disease

## 2023-01-20 ENCOUNTER — Other Ambulatory Visit: Payer: Self-pay

## 2023-01-20 ENCOUNTER — Ambulatory Visit (INDEPENDENT_AMBULATORY_CARE_PROVIDER_SITE_OTHER): Payer: PPO | Admitting: Infectious Disease

## 2023-01-20 VITALS — BP 150/80 | HR 76 | Temp 97.1°F | Ht <= 58 in | Wt 152.0 lb

## 2023-01-20 DIAGNOSIS — A4101 Sepsis due to Methicillin susceptible Staphylococcus aureus: Secondary | ICD-10-CM

## 2023-01-20 DIAGNOSIS — M4620 Osteomyelitis of vertebra, site unspecified: Secondary | ICD-10-CM

## 2023-01-20 DIAGNOSIS — T84010D Broken internal right hip prosthesis, subsequent encounter: Secondary | ICD-10-CM

## 2023-01-20 DIAGNOSIS — T84018D Broken internal joint prosthesis, other site, subsequent encounter: Secondary | ICD-10-CM

## 2023-01-20 DIAGNOSIS — B351 Tinea unguium: Secondary | ICD-10-CM | POA: Diagnosis not present

## 2023-01-20 DIAGNOSIS — M069 Rheumatoid arthritis, unspecified: Secondary | ICD-10-CM | POA: Diagnosis not present

## 2023-01-20 HISTORY — DX: Tinea unguium: B35.1

## 2023-01-20 MED ORDER — CEFADROXIL 500 MG PO CAPS: 1000.0000 mg | ORAL_CAPSULE | Freq: Two times a day (BID) | ORAL | 11 refills | Status: DC

## 2023-01-20 MED ORDER — TERBINAFINE HCL 250 MG PO TABS: 250.0000 mg | ORAL_TABLET | Freq: Every day | ORAL | 2 refills | Status: AC

## 2023-01-20 NOTE — Progress Notes (Signed)
Subjective:  Chief complaint: Follow-up for MSSA bacteremia and T-spine infection  Patient ID: [name], female    DOB: May 08, 1946, 77 y.o.   MRN: [id number]  HPI     77 year-old black woman with a past medical history significant for rheumatoid arthritis having been on etanercept and methotrexate diabetes mellitus hypertension hyperlipidemia who has had bilateral hip replacements lumbar laminectomy decompression and microdiscectomy in 2019 prior bilateral septic shoulders and MSSA bacteremia treated in 2012 or was it 2004 per patient and daughter with 6 weeks of cefazolin after successful surgery.  She presented to the ER on 25 January 2022   with acute on chronic worsening of her upper thoracic back pain.  Then seen by PCP and noted to be confused came to the ER with worsening fevers and chills.   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  She had initially been on broad-spectrum antibiotics in the form of vancomycin and cefepime and then ceftriaxone.  Blood cultures were without growth and IR guided aspirate of the disc space yielded methicillin sensitive Staphylococcus aureus.   There additionally been concern for possible cervical spine disease.  MRI of her thoracic spine was performed on May 4 which showed progressive findings in the thoracic spine from T8-T10 with progression of osteomyelitis with inflammation and spinal cord compression maximal at T8 with continued presence of an epidural phlegmon and dural thickening and enhancement.  She also had more degeneration of her thoracic spine 10 through 11.  MRI of the cervical spine fortunately did not show infectious pathology.  She was taken to the operating room by Dr. Wendee Copp T7, T8, T9, T10 and T11 laminectomy for drainage of epidural abscess; T7-8, T8-9, T9-10, T10-11 posterior lateral arthrodesis with local morselized autograft bone    Cultures were unrevealing  but she had been on antibiotics.  The clock was restarted on IV antibiotics and she was placed on cefazolin.  She was dramatically better after surgery.  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 Mayer Camel is going to need to perform surgery to place at least a component of it.  She has successfully undergone surgery for this.  She is tolerating antibiotics relatively well though she does have loose stools appears while she is on the cefadroxil.  She is comfortable staying on it for now.  Her back pain is stable.  She has had some onychomycosis for which she is having trimming by podiatry and I have offered her Lamisil      Past Medical History:  Diagnosis Date   Anxiety    Arthritis    "knees, ankles, fingers" (07/06/2018)   Asthma    Chronic lower back pain    Dyspnea    with asthma attacks    Dysrhythmia    Family history of adverse reaction to anesthesia    my first cousin had difficulty waking up    Fibroid    ovarian   GERD (gastroesophageal reflux disease)    Hip osteoarthritis    Left   History of staph infection    in hospital for 11 days/ in 2007   Hyperlipidemia    Hypertension    Radiculopathy    Tremor 03/30/2022   Type II diabetes mellitus ([place])    Upper extremity weakness 03/30/2022   Wears glasses     Past Surgical History:  Procedure Laterality Date   ANTERIOR CERVICAL DECOMP/DISCECTOMY FUSION  N/A 12/11/2015   Procedure: ANTERIOR CERVICAL DECOMPRESSION/DISCECTOMY FUSION 2 LEVELS;  Surgeon: Phylliss Bob, MD;  Location: [facility];  Service: Orthopedics;  Laterality: N/A;  Anterior cervical decompression fusion, cervical 6-7, cervical 7-thoracic 1 with instrumentation and allograft   BACK SURGERY     CARDIAC CATHETERIZATION  1998   CATARACT EXTRACTION W/ INTRAOCULAR LENS  IMPLANT, BILATERAL Bilateral 2015   Bil   COLONOSCOPY     CRYOABLATION  1988   "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"    INCISION AND DRAINAGE Bilateral 2007>   "cleaned staph out of shoulders"   IR FLUORO GUIDED NEEDLE PLC ASPIRATION/INJECTION LOC  01/27/2022   IR FLUORO GUIDED NEEDLE PLC ASPIRATION/INJECTION LOC  01/27/2022   JOINT REPLACEMENT     LAPAROSCOPIC OVARIAN CYSTECTOMY  1970   LUMBAR LAMINECTOMY/DECOMPRESSION MICRODISCECTOMY N/A 01/31/2018   Procedure: LAMINECTOMY AND FORAMINOTOMY LUMBAR TWO- LUMBAR THREE, LUMBAR THREE- LUMBAR FOUR, LUMBAR FOUR- LUMBAR FIVE ;  Surgeon: Newman Pies, MD;  Location: [facility];  Service: Neurosurgery;  Laterality: N/A;   LUMBAR LAMINECTOMY/DECOMPRESSION MICRODISCECTOMY N/A 04/13/2022   Procedure: Thoracic seven,Thoracic eight, Thoracic nine, Thoracic ten LAMINECTOMY;  Surgeon: Newman Pies, MD;  Location: [facility];  Service: Neurosurgery;  Laterality: N/A;   POSTERIOR CERVICAL LAMINECTOMY  ~ [place] Right 02/2003   THUMB FUSION Right 2010   right thumb   TONSILLECTOMY     TOTAL ABDOMINAL HYSTERECTOMY  1998   TAH,BSO   TOTAL HIP ARTHROPLASTY Right 05/18/2018   Procedure: TOTAL HIP ARTHROPLASTY ANTERIOR APPROACH;  Surgeon: Frederik Pear, MD;  Location: [facility];  Service: Orthopedics;  Laterality: Right;   TOTAL HIP ARTHROPLASTY Left 07/06/2018   TOTAL HIP ARTHROPLASTY Left 07/06/2018   Procedure: LEFT TOTAL HIP ARTHROPLASTY ANTERIOR APPROACH;  Surgeon: Frederik Pear, MD;  Location: [facility];  Service: Orthopedics;  Laterality: Left;  Needs RNFA   TOTAL HIP REVISION Right 08/03/2022   Procedure: TOTAL HIP REVISION;  Surgeon: Frederik Pear, MD;  Location: [facility];  Service: Orthopedics;  Laterality: Right;   TUBAL LIGATION      Family History  Problem Relation Age of Onset   Diabetes Sister    Diabetes Brother    Diabetes Brother    Diabetes Paternal Uncle    Cancer Paternal Aunt        UTERINE   Hypertension Maternal Grandmother    Heart disease Maternal Grandmother    Colon cancer Neg Hx       Social History   Socioeconomic History    Marital status: Married    Spouse name: Not on file   Number of children: Not on file   Years of education: Not on file   Highest education level: Not on file  Occupational History   Not on file  Tobacco Use   Smoking status: Former    Packs/day: 1.00    Years: 25.00    Total pack years: 25.00    Types: Cigarettes    Quit date: 11/23/1992    Years since quitting: 30.1   Smokeless tobacco: Never  Vaping Use   Vaping Use: Never used  Substance and Sexual Activity   Alcohol use: Not Currently   Drug use: Never   Sexual activity: Not Currently  Other Topics Concern   Not on file  Social History Narrative   Not on file   Social Determinants of Health   Financial Resource Strain: Not on file  Food Insecurity: No Food Insecurity (05/20/2020)  Hunger Vital Sign    Worried About Running Out of Food in the Last Year: Never true    Ran Out of Food in the Last Year: Never true  Transportation Needs: No Transportation Needs (05/20/2020)   PRAPARE - Hydrologist (Medical): No    Lack of Transportation (Non-Medical): No  Physical Activity: Not on file  Stress: Not on file  Social Connections: Not on file    Allergies  Allergen Reactions   Ivp Dye [Iodinated Contrast Media] Nausea And Vomiting   Prednisone Other (See Comments)    reflux     Current Outpatient Medications:    albuterol (ACCUNEB) 1.25 MG/3ML nebulizer solution, Take 1 ampule by nebulization every 4 (four) hours as needed for wheezing., Disp: , Rfl:    albuterol (VENTOLIN HFA) 108 (90 Base) MCG/ACT inhaler, Inhale 2 puffs into the lungs every 6 (six) hours as needed for wheezing or shortness of breath., Disp: , Rfl:    aspirin EC 81 MG tablet, Take 1 tablet (81 mg total) by mouth 2 (two) times daily., Disp: 60 tablet, Rfl: 0   Blood Glucose Monitoring Suppl (ONE TOUCH ULTRA 2) w/Device KIT, Check blood sugar twice per week, Disp: , Rfl:    bumetanide (BUMEX) 1 MG tablet, Take 1 mg by  mouth 2 (two) times daily., Disp: , Rfl:    cefadroxil (DURICEF) 500 MG capsule, Take 2 capsules (1,000 mg total) by mouth 2 (two) times daily., Disp: 360 capsule, Rfl: 1   cetirizine (ZYRTEC) 10 MG tablet, Take 10 mg by mouth daily., Disp: , Rfl:    cholecalciferol (VITAMIN D3) 25 MCG (1000 UNIT) tablet, Take 1,000 Units by mouth daily., Disp: , Rfl:    diclofenac Sodium (VOLTAREN) 1 % GEL, Apply 4 g topically 4 (four) times daily. (Patient taking differently: Apply 4 g topically daily as needed (pain).), Disp: 100 g, Rfl: 0   diltiazem (CARDIZEM CD) 180 MG 24 hr capsule, Take 180 mg by mouth daily., Disp: , Rfl:    enalapril (VASOTEC) 20 MG tablet, Take 20 mg by mouth 2 (two) times daily., Disp: , Rfl:    folic acid (FOLVITE) Q000111Q MCG tablet, Take 800 mcg by mouth daily., Disp: , Rfl:    gabapentin (NEURONTIN) 300 MG capsule, Take 300 mg by mouth at bedtime., Disp: , Rfl:    glucose blood test strip, , Disp: , Rfl:    hydrOXYzine (ATARAX) 10 MG tablet, Take 10-20 mg by mouth 2 (two) times daily as needed for anxiety., Disp: , Rfl:    Lancets (ONETOUCH DELICA PLUS [id number]) MISC, Use to check blood sugar, Disp: , Rfl:    lovastatin (MEVACOR) 40 MG tablet, Take 40 mg by mouth at bedtime. , Disp: , Rfl:    meclizine (ANTIVERT) 12.5 MG tablet, Take 12.5 mg by mouth 2 (two) times daily as needed for dizziness., Disp: , Rfl:    metFORMIN (GLUCOPHAGE-XR) 500 MG 24 hr tablet, Take 500 mg by mouth in the morning and at bedtime., Disp: , Rfl:    Multiple Vitamin (MULTIVITAMIN WITH MINERALS) TABS tablet, Take 1 tablet by mouth daily., Disp: , Rfl:    omeprazole (PRILOSEC OTC) 20 MG tablet, Take 20 mg by mouth daily as needed (heartburn)., Disp: , Rfl:    ONETOUCH ULTRA test strip, , Disp: , Rfl:    oxyCODONE-acetaminophen (PERCOCET/ROXICET) 5-325 MG tablet, Take 1 tablet by mouth every 4 (four) hours as needed for severe pain., Disp: 30 tablet, Rfl: 0  sodium chloride (OCEAN) 0.65 % SOLN nasal spray,  Place 1-2 sprays into the nose 4 (four) times daily as needed for congestion., Disp: , Rfl:    tiZANidine (ZANAFLEX) 2 MG tablet, Take 1 tablet (2 mg total) by mouth every 6 (six) hours as needed., Disp: 60 tablet, Rfl: 0   TRELEGY ELLIPTA 200-62.5-25 MCG/ACT AEPB, Take 1 puff by mouth daily., Disp: , Rfl:     Review of Systems  Constitutional:  Negative for activity change, appetite change, chills, diaphoresis, fatigue, fever and unexpected weight change.  HENT:  Negative for congestion, rhinorrhea, sinus pressure, sneezing, sore throat and trouble swallowing.   Eyes:  Negative for photophobia and visual disturbance.  Respiratory:  Negative for cough, chest tightness, shortness of breath, wheezing and stridor.   Cardiovascular:  Negative for chest pain, palpitations and leg swelling.  Gastrointestinal:  Negative for abdominal distention, abdominal pain, anal bleeding, blood in stool, constipation, diarrhea, nausea and vomiting.  Genitourinary:  Negative for difficulty urinating, dysuria, flank pain and hematuria.  Musculoskeletal:  Negative for arthralgias, back pain, gait problem, joint swelling and myalgias.  Skin:  Negative for color change, pallor, rash and wound.  Neurological:  Negative for dizziness, tremors, weakness and light-headedness.  Hematological:  Negative for adenopathy. Does not bruise/bleed easily.  Psychiatric/Behavioral:  Negative for agitation, behavioral problems, confusion, decreased concentration, dysphoric mood and sleep disturbance.        Objective:   Physical Exam Constitutional:      General: She is not in acute distress.    Appearance: Normal appearance. She is well-developed. She is not ill-appearing or diaphoretic.  HENT:     Head: Normocephalic and atraumatic.     Right Ear: Hearing and external ear normal.     Left Ear: Hearing and external ear normal.     Nose: No nasal deformity or rhinorrhea.  Eyes:     General: No scleral icterus.     Conjunctiva/sclera: Conjunctivae normal.     Right eye: Right conjunctiva is not injected.     Left eye: Left conjunctiva is not injected.     Pupils: Pupils are equal, round, and reactive to light.  Neck:     Vascular: No JVD.  Cardiovascular:     Rate and Rhythm: Normal rate and regular rhythm.     Heart sounds: S1 normal and S2 normal.  Pulmonary:     Effort: Pulmonary effort is normal. No respiratory distress.     Breath sounds: No wheezing.  Abdominal:     General: Bowel sounds are normal. There is no distension.     Palpations: Abdomen is soft.     Tenderness: There is no abdominal tenderness.  Musculoskeletal:        General: Normal range of motion.     Right shoulder: Normal.     Left shoulder: Normal.     Cervical back: Normal range of motion and neck supple.     Right hip: Normal.     Left hip: Normal.     Right knee: Normal.     Left knee: Normal.  Lymphadenopathy:     Head:     Right side of head: No submandibular, preauricular or posterior auricular adenopathy.     Left side of head: No submandibular, preauricular or posterior auricular adenopathy.     Cervical: No cervical adenopathy.     Right cervical: No superficial or deep cervical adenopathy.    Left cervical: No superficial or deep cervical adenopathy.  Skin:  General: Skin is warm and dry.     Coloration: Skin is not pale.     Findings: No abrasion, bruising, ecchymosis, erythema, lesion or rash.     Nails: There is no clubbing.  Neurological:     Mental Status: She is alert and oriented to person, place, and time.     Sensory: No sensory deficit.     Coordination: Coordination normal.     Gait: Gait normal.  Psychiatric:        Attention and Perception: She is attentive.        Mood and Affect: Mood normal.        Speech: Speech normal.        Behavior: Behavior normal. Behavior is cooperative.        Thought Content: Thought content normal.        Judgment: Judgment normal.             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  RA she is going to see allergy: I would not be opposed to methotrexate long as she maintains on oral antibiotics but I would avoid potent biologic drugs.   Static hip loosening status post revision  Onychomycosis will give Lamisil for 3 months

## 2023-01-21 LAB — BASIC METABOLIC PANEL WITH GFR
BUN: 17 mg/dL (ref 7–25)
CO2: 24 mmol/L (ref 20–32)
Calcium: 10.1 mg/dL (ref 8.6–10.4)
Chloride: 106 mmol/L (ref 98–110)
Creat: 0.9 mg/dL (ref 0.60–1.00)
Glucose, Bld: 77 mg/dL (ref 65–99)
Potassium: 4.1 mmol/L (ref 3.5–5.3)
Sodium: 140 mmol/L (ref 135–146)
eGFR: 67 mL/min/{1.73_m2} (ref >=60)

## 2023-01-21 LAB — C-REACTIVE PROTEIN: CRP: 5.1 mg/L (ref <8.0)

## 2023-01-21 LAB — CBC WITH DIFFERENTIAL/PLATELET
Absolute Monocytes: 628 cells/uL (ref 200–950)
Basophils Absolute: 62 cells/uL (ref 0–200)
Basophils Relative: 0.6 %
Eosinophils Absolute: 237 cells/uL (ref 15–500)
Eosinophils Relative: 2.3 %
HCT: 35.9 % (ref 35.0–45.0)
Hemoglobin: 11.4 g/dL — ABNORMAL LOW (ref 11.7–15.5)
Lymphs Abs: 2668 cells/uL (ref 850–3900)
MCH: 24.4 pg — ABNORMAL LOW (ref 27.0–33.0)
MCHC: 31.8 g/dL — ABNORMAL LOW (ref 32.0–36.0)
MCV: 76.9 fL — ABNORMAL LOW (ref 80.0–100.0)
MPV: 10.7 fL (ref 7.5–12.5)
Monocytes Relative: 6.1 %
Neutro Abs: 6705 cells/uL (ref 1500–7800)
Neutrophils Relative %: 65.1 %
Platelets: 383 10*3/uL (ref 140–400)
RBC: 4.67 10*6/uL (ref 3.80–5.10)
RDW: 16.6 % — ABNORMAL HIGH (ref 11.0–15.0)
Total Lymphocyte: 25.9 %
WBC: 10.3 10*3/uL (ref 3.8–10.8)

## 2023-01-21 LAB — SEDIMENTATION RATE: Sed Rate: 17 mm/h (ref 0–30)

## 2023-01-25 DIAGNOSIS — M25572 Pain in left ankle and joints of left foot: Secondary | ICD-10-CM | POA: Diagnosis not present

## 2023-01-25 DIAGNOSIS — M79672 Pain in left foot: Secondary | ICD-10-CM | POA: Diagnosis not present

## 2023-01-25 DIAGNOSIS — M0609 Rheumatoid arthritis without rheumatoid factor, multiple sites: Secondary | ICD-10-CM | POA: Diagnosis not present

## 2023-01-25 DIAGNOSIS — M79671 Pain in right foot: Secondary | ICD-10-CM | POA: Diagnosis not present

## 2023-01-25 DIAGNOSIS — M4620 Osteomyelitis of vertebra, site unspecified: Secondary | ICD-10-CM | POA: Diagnosis not present

## 2023-01-25 DIAGNOSIS — M79642 Pain in left hand: Secondary | ICD-10-CM | POA: Diagnosis not present

## 2023-01-25 DIAGNOSIS — M25571 Pain in right ankle and joints of right foot: Secondary | ICD-10-CM | POA: Diagnosis not present

## 2023-01-25 DIAGNOSIS — M1990 Unspecified osteoarthritis, unspecified site: Secondary | ICD-10-CM | POA: Diagnosis not present

## 2023-01-25 DIAGNOSIS — M79641 Pain in right hand: Secondary | ICD-10-CM | POA: Diagnosis not present

## 2023-01-25 DIAGNOSIS — Z79899 Other long term (current) drug therapy: Secondary | ICD-10-CM | POA: Diagnosis not present

## 2023-01-25 DIAGNOSIS — M25551 Pain in right hip: Secondary | ICD-10-CM | POA: Diagnosis not present

## 2023-01-25 DIAGNOSIS — M25512 Pain in left shoulder: Secondary | ICD-10-CM | POA: Diagnosis not present

## 2023-01-26 DIAGNOSIS — M25651 Stiffness of right hip, not elsewhere classified: Secondary | ICD-10-CM | POA: Diagnosis not present

## 2023-02-22 ENCOUNTER — Other Ambulatory Visit: Payer: Self-pay

## 2023-02-22 MED ORDER — CEFADROXIL 500 MG PO CAPS: 1000.0000 mg | ORAL_CAPSULE | Freq: Two times a day (BID) | ORAL | 11 refills | Status: DC

## 2023-02-22 NOTE — Progress Notes (Signed)
Cefadroxil prescription from 01/20/23 was ordered as "phone in" and [place] did not receive Rx. New Rx sent electronically.   Beryle Flock, RN

## 2023-02-27 ENCOUNTER — Other Ambulatory Visit: Payer: Self-pay | Admitting: Infectious Disease

## 2023-02-27 MED ORDER — CEFADROXIL 500 MG PO CAPS: 1000.0000 mg | ORAL_CAPSULE | Freq: Two times a day (BID) | ORAL | 0 refills | Status: DC

## 2023-03-09 DIAGNOSIS — M25551 Pain in right hip: Secondary | ICD-10-CM | POA: Diagnosis not present

## 2023-03-09 DIAGNOSIS — M4620 Osteomyelitis of vertebra, site unspecified: Secondary | ICD-10-CM | POA: Diagnosis not present

## 2023-03-09 DIAGNOSIS — M0609 Rheumatoid arthritis without rheumatoid factor, multiple sites: Secondary | ICD-10-CM | POA: Diagnosis not present

## 2023-03-09 DIAGNOSIS — M79643 Pain in unspecified hand: Secondary | ICD-10-CM | POA: Diagnosis not present

## 2023-03-09 DIAGNOSIS — Z79899 Other long term (current) drug therapy: Secondary | ICD-10-CM | POA: Diagnosis not present

## 2023-03-09 DIAGNOSIS — M25512 Pain in left shoulder: Secondary | ICD-10-CM | POA: Diagnosis not present

## 2023-03-09 DIAGNOSIS — M1990 Unspecified osteoarthritis, unspecified site: Secondary | ICD-10-CM | POA: Diagnosis not present

## 2023-03-10 DIAGNOSIS — R5383 Other fatigue: Secondary | ICD-10-CM | POA: Diagnosis not present

## 2023-03-10 DIAGNOSIS — E782 Mixed hyperlipidemia: Secondary | ICD-10-CM | POA: Diagnosis not present

## 2023-03-10 DIAGNOSIS — Z79899 Other long term (current) drug therapy: Secondary | ICD-10-CM | POA: Diagnosis not present

## 2023-03-10 DIAGNOSIS — I700 Atherosclerosis of aorta: Secondary | ICD-10-CM | POA: Diagnosis not present

## 2023-03-10 DIAGNOSIS — N182 Chronic kidney disease, stage 2 (mild): Secondary | ICD-10-CM | POA: Diagnosis not present

## 2023-03-10 DIAGNOSIS — E669 Obesity, unspecified: Secondary | ICD-10-CM | POA: Diagnosis not present

## 2023-03-10 DIAGNOSIS — M0609 Rheumatoid arthritis without rheumatoid factor, multiple sites: Secondary | ICD-10-CM | POA: Diagnosis not present

## 2023-03-10 DIAGNOSIS — Z0000 Encounter for general adult medical examination without abnormal findings: Secondary | ICD-10-CM | POA: Diagnosis not present

## 2023-03-10 DIAGNOSIS — J432 Centrilobular emphysema: Secondary | ICD-10-CM | POA: Diagnosis not present

## 2023-03-10 DIAGNOSIS — E1142 Type 2 diabetes mellitus with diabetic polyneuropathy: Secondary | ICD-10-CM | POA: Diagnosis not present

## 2023-03-10 DIAGNOSIS — Z23 Encounter for immunization: Secondary | ICD-10-CM | POA: Diagnosis not present

## 2023-03-17 DIAGNOSIS — I700 Atherosclerosis of aorta: Secondary | ICD-10-CM | POA: Diagnosis not present

## 2023-03-17 DIAGNOSIS — J432 Centrilobular emphysema: Secondary | ICD-10-CM | POA: Diagnosis not present

## 2023-03-17 DIAGNOSIS — E1142 Type 2 diabetes mellitus with diabetic polyneuropathy: Secondary | ICD-10-CM | POA: Diagnosis not present

## 2023-03-17 DIAGNOSIS — N182 Chronic kidney disease, stage 2 (mild): Secondary | ICD-10-CM | POA: Diagnosis not present

## 2023-03-17 DIAGNOSIS — M0609 Rheumatoid arthritis without rheumatoid factor, multiple sites: Secondary | ICD-10-CM | POA: Diagnosis not present

## 2023-03-17 DIAGNOSIS — E782 Mixed hyperlipidemia: Secondary | ICD-10-CM | POA: Diagnosis not present

## 2023-03-17 DIAGNOSIS — Z0000 Encounter for general adult medical examination without abnormal findings: Secondary | ICD-10-CM | POA: Diagnosis not present

## 2023-03-17 DIAGNOSIS — I10 Essential (primary) hypertension: Secondary | ICD-10-CM | POA: Diagnosis not present

## 2023-04-07 ENCOUNTER — Ambulatory Visit (INDEPENDENT_AMBULATORY_CARE_PROVIDER_SITE_OTHER): Payer: PPO | Admitting: Podiatry

## 2023-04-07 ENCOUNTER — Telehealth: Payer: Self-pay | Admitting: Podiatry

## 2023-04-07 DIAGNOSIS — M79674 Pain in right toe(s): Secondary | ICD-10-CM

## 2023-04-07 DIAGNOSIS — M79675 Pain in left toe(s): Secondary | ICD-10-CM | POA: Diagnosis not present

## 2023-04-07 DIAGNOSIS — E119 Type 2 diabetes mellitus without complications: Secondary | ICD-10-CM | POA: Diagnosis not present

## 2023-04-07 DIAGNOSIS — B351 Tinea unguium: Secondary | ICD-10-CM

## 2023-04-07 NOTE — Telephone Encounter (Signed)
Pt left message today at 1213pm stating she had received a call stating Dr Eloy End went home sick and she was upset and stated that she needs her nails cut and trimmed today as she has been waiting 3 months.   Pt is been seen today by Dr Allena Katz.

## 2023-04-07 NOTE — Progress Notes (Signed)
  Subjective:  Patient ID: [name], female    DOB: December 23, 1945,  MRN: [id number]  [name] presents to clinic today for preventative diabetic foot care and callus(es) left foot, porokeratotic lesion(s) left lower extremity, and painful mycotic nails. Painful toenails interfere with ambulation. Aggravating factors include wearing enclosed shoe gear. Pain is relieved with periodic professional debridement. Painful callus(es) and porokeratotic lesion(s) are aggravated when weightbearing with and without shoegear. Pain is relieved with periodic professional debridement.  Chief Complaint  Patient presents with   Diabetes    Nail trim    New problem(s): None.   PCP is Georgianne Fick, MD.  Allergies  Allergen Reactions   Ivp Dye [Iodinated Contrast Media] Nausea And Vomiting   Prednisone Other (See Comments)    reflux    Review of Systems: Negative except as noted in the HPI.  Objective: No changes noted in today's physical examination. There were no vitals filed for this visit.  [name] is a pleasant 77 y.o. female WD, WN in NAD. AAO x 3. Vascular Examination: CFT immediate b/l LE. Palpable DP/PT pulses b/l LE. Digital hair sparse b/l. Skin temperature gradient WNL b/l. No pain with calf compression b/l. No edema noted b/l. No cyanosis or clubbing noted b/l LE.  Dermatological Examination: Pedal skin is warm and supple b/l LE. No open wounds b/l LE. No interdigital macerations noted b/l LE. Anonychia noted right great toe. Nailbed(s) epithelialized.  Toenails left hallux and 2-5 bilaterally severely elongated, thickened with discoloration, also possessing excessive curvature and impinging onto the distal tips of the digits. No erythema, no edema, no drainage, no fluctuance.   Hyperkeratotic lesion(s) L hallux. No erythema, no edema, no drainage, no fluctuance. Porokeratotic lesion(s) L 5th toe. No erythema, no edema, no drainage, no fluctuance.  Musculoskeletal  Examination: Muscle strength 5/5 to all lower extremity muscle groups bilaterally. No pain, crepitus or joint limitation noted with ROM bilateral LE. No gross bony deformities bilaterally.  Neurological Examination: Protective sensation intact 5/5 intact bilaterally with 10g monofilament b/l. Vibratory sensation intact b/l. Assessment/Plan: No diagnosis found.   -Consent given for treatment as described below: -Examined patient. -Toenails 2-5 bilaterally debrided in length and girth without iatrogenic bleeding with sterile nail nipper and dremel.  -Callus(es) L hallux pared utilizing sterile scalpel blade without complication or incident. Total number debrided =1. -Porokeratotic lesion(s) L 5th toe pared and enucleated with sterile currette without incident. Total number of lesions debrided=1. -Patient/POA to call should there be question/concern in the interim.   Return in about 3 months (around 07/08/2023).  Candelaria Stagers, DPM

## 2023-05-20 ENCOUNTER — Other Ambulatory Visit: Payer: Self-pay

## 2023-05-20 ENCOUNTER — Ambulatory Visit: Payer: PPO | Admitting: Infectious Disease

## 2023-05-20 ENCOUNTER — Encounter: Payer: Self-pay | Admitting: Infectious Disease

## 2023-05-20 VITALS — BP 121/73 | HR 79 | Temp 97.6°F | Ht <= 58 in | Wt 155.0 lb

## 2023-05-20 DIAGNOSIS — G062 Extradural and subdural abscess, unspecified: Secondary | ICD-10-CM | POA: Diagnosis not present

## 2023-05-20 DIAGNOSIS — B9561 Methicillin susceptible Staphylococcus aureus infection as the cause of diseases classified elsewhere: Secondary | ICD-10-CM | POA: Diagnosis not present

## 2023-05-20 DIAGNOSIS — N182 Chronic kidney disease, stage 2 (mild): Secondary | ICD-10-CM

## 2023-05-20 DIAGNOSIS — M069 Rheumatoid arthritis, unspecified: Secondary | ICD-10-CM

## 2023-05-20 DIAGNOSIS — R7881 Bacteremia: Secondary | ICD-10-CM | POA: Diagnosis not present

## 2023-05-20 DIAGNOSIS — T84018D Broken internal joint prosthesis, other site, subsequent encounter: Secondary | ICD-10-CM

## 2023-05-20 HISTORY — DX: Methicillin susceptible Staphylococcus aureus infection as the cause of diseases classified elsewhere: B95.61

## 2023-05-20 NOTE — Progress Notes (Signed)
Subjective:  Chief complaint: followup for MSSA bacteremia and T spine infection   Patient ID: [name], female    DOB: [birth year], 77 y.o.   MRN: [id number]  HPI     77 year-old black woman with a past medical history significant for rheumatoid arthritis having been on etanercept and methotrexate diabetes mellitus hypertension hyperlipidemia who has had bilateral hip replacements lumbar laminectomy decompression and microdiscectomy in 2019 prior bilateral septic shoulders and MSSA bacteremia treated in 2012 or was it 2004 per patient and daughter with 6 weeks of cefazolin after successful surgery.  She presented to the ER on 25 January 2022   with acute on chronic worsening of her upper thoracic back pain.  Then seen by PCP and noted to be confused came to the ER with worsening fevers and chills.   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  She had initially been on broad-spectrum antibiotics in the form of vancomycin and cefepime and then ceftriaxone.  Blood cultures were without growth and IR guided aspirate of the disc space yielded methicillin sensitive Staphylococcus aureus.   There additionally been concern for possible cervical spine disease.  MRI of her thoracic spine was performed on Mar 26, 2022 which showed progressive findings in the thoracic spine from T8-T10 with progression of osteomyelitis with inflammation and spinal cord compression maximal at T8 with continued presence of an epidural phlegmon and dural thickening and enhancement.  She also had more degeneration of her thoracic spine 10 through 11.  MRI of the cervical spine fortunately did not show infectious pathology.  She was taken to the operating room by Dr. Patricia Nettle T7, T8, T9, T10 and T11 laminectomy for drainage of epidural abscess; T7-8, T8-9, T9-10, T10-11 posterior lateral arthrodesis with local morselized autograft bone    Cultures were  unrevealing but she had been on antibiotics.  The clock was restarted on IV antibiotics and she was placed on cefazolin.  She was dramatically better after surgery. 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 Turner Daniels is going to need to perform surgery to place at least a component of it.  She has successfully undergone surgery for this.  She has been consistently taking her cefadroxil and is not had antibiotics for MORE than a year.  She denies having back pain whatsoever.      Past Medical History:  Diagnosis Date   Anxiety    Arthritis    "knees, ankles, fingers" (07/06/2018)   Asthma    Chronic lower back pain    Dyspnea    with asthma attacks    Dysrhythmia    Family history of adverse reaction to anesthesia    my first cousin had difficulty waking up    Fibroid    ovarian   GERD (gastroesophageal reflux disease)    Hip osteoarthritis    Left   History of staph infection    in hospital for 11 days/ in 2007   Hyperlipidemia    Hypertension    Onychomycosis 01/20/2023   Radiculopathy    Tremor 03/30/2022   Type II diabetes mellitus (HCC)    Upper extremity weakness 03/30/2022   Wears glasses     Past Surgical History:  Procedure Laterality Date   ANTERIOR CERVICAL DECOMP/DISCECTOMY FUSION N/A 12/11/2015   Procedure: ANTERIOR CERVICAL DECOMPRESSION/DISCECTOMY FUSION 2 LEVELS;  Surgeon: Estill Bamberg, MD;  Location: [facility] OR;  Service: Orthopedics;  Laterality: N/A;  Anterior cervical decompression fusion, cervical 6-7, cervical 7-thoracic 1 with instrumentation and allograft   BACK SURGERY     CARDIAC CATHETERIZATION  1998   CATARACT EXTRACTION W/ INTRAOCULAR LENS  IMPLANT, BILATERAL Bilateral 2015   Bil   COLONOSCOPY     CRYOABLATION  1988   "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"   INCISION AND DRAINAGE Bilateral 2007>   "cleaned staph out of shoulders"   IR  FLUORO GUIDED NEEDLE PLC ASPIRATION/INJECTION LOC  01/27/2022   IR FLUORO GUIDED NEEDLE PLC ASPIRATION/INJECTION LOC  01/27/2022   JOINT REPLACEMENT     LAPAROSCOPIC OVARIAN CYSTECTOMY  1970   LUMBAR LAMINECTOMY/DECOMPRESSION MICRODISCECTOMY N/A 01/31/2018   Procedure: LAMINECTOMY AND FORAMINOTOMY LUMBAR TWO- LUMBAR THREE, LUMBAR THREE- LUMBAR FOUR, LUMBAR FOUR- LUMBAR FIVE ;  Surgeon: Tressie Stalker, MD;  Location: [facility] OR;  Service: Neurosurgery;  Laterality: N/A;   LUMBAR LAMINECTOMY/DECOMPRESSION MICRODISCECTOMY N/A 04/13/2022   Procedure: Thoracic seven,Thoracic eight, Thoracic nine, Thoracic ten LAMINECTOMY;  Surgeon: Tressie Stalker, MD;  Location: [facility] OR;  Service: Neurosurgery;  Laterality: N/A;   POSTERIOR CERVICAL LAMINECTOMY  ~ 1999   SHOULDER OPEN ROTATOR CUFF REPAIR Right 02/2003   THUMB FUSION Right 2010   right thumb   TONSILLECTOMY     TOTAL ABDOMINAL HYSTERECTOMY  1998   TAH,BSO   TOTAL HIP ARTHROPLASTY Right 05/18/2018   Procedure: TOTAL HIP ARTHROPLASTY ANTERIOR APPROACH;  Surgeon: Gean Birchwood, MD;  Location: [facility] OR;  Service: Orthopedics;  Laterality: Right;   TOTAL HIP ARTHROPLASTY Left 07/06/2018   TOTAL HIP ARTHROPLASTY Left 07/06/2018   Procedure: LEFT TOTAL HIP ARTHROPLASTY ANTERIOR APPROACH;  Surgeon: Gean Birchwood, MD;  Location: [facility] OR;  Service: Orthopedics;  Laterality: Left;  Needs RNFA   TOTAL HIP REVISION Right 08/03/2022   Procedure: TOTAL HIP REVISION;  Surgeon: Gean Birchwood, MD;  Location: [facility] OR[facility];  Service: Orthopedics;  Laterality: Right;   TUBAL LIGATION      Family History  Problem Relation Age of Onset   Diabetes Sister    Diabetes Brother    Diabetes Brother    Diabetes Paternal Uncle    Cancer Paternal Aunt        UTERINE   Hypertension Maternal Grandmother    Heart disease Maternal Grandmother    Colon cancer Neg Hx       Social History   Socioeconomic History   Marital status: Married    Spouse name: Not on file   Number of children: Not  on file   Years of education: Not on file   Highest education level: Not on file  Occupational History   Not on file  Tobacco Use   Smoking status: Former    Packs/day: 1.00    Years: 25.00    Additional pack years: 0.00    Total pack years: 25.00    Types: Cigarettes    Quit date: 11/23/1992    Years since quitting: 30.5   Smokeless tobacco: Never  Vaping Use   Vaping Use: Never used  Substance and Sexual Activity   Alcohol use: Not Currently   Drug use: Never   Sexual activity: Not Currently  Other Topics Concern   Not on file  Social History Narrative   Not on file   Social Determinants of Health   Financial Resource Strain: Not on file  Food Insecurity: No Food Insecurity (05/20/2020)   Hunger Vital Sign    Worried About Running  Out of Food in the Last Year: Never true    Ran Out of Food in the Last Year: Never true  Transportation Needs: No Transportation Needs (05/20/2020)   PRAPARE - Administrator, Civil Service (Medical): No    Lack of Transportation (Non-Medical): No  Physical Activity: Not on file  Stress: Not on file  Social Connections: Not on file    Allergies  Allergen Reactions   Ivp Dye [Iodinated Contrast Media] Nausea And Vomiting   Prednisone Other (See Comments)    reflux     Current Outpatient Medications:    albuterol (ACCUNEB) 1.25 MG/3ML nebulizer solution, Take 1 ampule by nebulization every 4 (four) hours as needed for wheezing., Disp: , Rfl:    albuterol (VENTOLIN HFA) 108 (90 Base) MCG/ACT inhaler, Inhale 2 puffs into the lungs every 6 (six) hours as needed for wheezing or shortness of breath., Disp: , Rfl:    aspirin EC 81 MG tablet, Take 1 tablet (81 mg total) by mouth 2 (two) times daily., Disp: 60 tablet, Rfl: 0   Blood Glucose Monitoring Suppl (ONE TOUCH ULTRA 2) w/Device KIT, Check blood sugar twice per week, Disp: , Rfl:    bumetanide (BUMEX) 1 MG tablet, Take 1 mg by mouth 2 (two) times daily., Disp: , Rfl:     cefadroxil (DURICEF) 500 MG capsule, Take 2 capsules (1,000 mg total) by mouth 2 (two) times daily., Disp: 40 capsule, Rfl: 0   cetirizine (ZYRTEC) 10 MG tablet, Take 10 mg by mouth daily., Disp: , Rfl:    cholecalciferol (VITAMIN D3) 25 MCG (1000 UNIT) tablet, Take 1,000 Units by mouth daily., Disp: , Rfl:    diclofenac Sodium (VOLTAREN) 1 % GEL, Apply 4 g topically 4 (four) times daily. (Patient taking differently: Apply 4 g topically daily as needed (pain).), Disp: 100 g, Rfl: 0   diltiazem (CARDIZEM CD) 180 MG 24 hr capsule, Take 180 mg by mouth daily., Disp: , Rfl:    enalapril (VASOTEC) 20 MG tablet, Take 20 mg by mouth 2 (two) times daily., Disp: , Rfl:    folic acid (FOLVITE) 800 MCG tablet, Take 800 mcg by mouth daily., Disp: , Rfl:    gabapentin (NEURONTIN) 300 MG capsule, Take 300 mg by mouth at bedtime., Disp: , Rfl:    glucose blood test strip, , Disp: , Rfl:    hydrOXYzine (ATARAX) 10 MG tablet, Take 10-20 mg by mouth 2 (two) times daily as needed for anxiety., Disp: , Rfl:    Lancets (ONETOUCH DELICA PLUS LANCET33G) MISC, Use to check blood sugar, Disp: , Rfl:    lovastatin (MEVACOR) 40 MG tablet, Take 40 mg by mouth at bedtime. , Disp: , Rfl:    meclizine (ANTIVERT) 12.5 MG tablet, Take 12.5 mg by mouth 2 (two) times daily as needed for dizziness., Disp: , Rfl:    metFORMIN (GLUCOPHAGE-XR) 500 MG 24 hr tablet, Take 500 mg by mouth in the morning and at bedtime., Disp: , Rfl:    Multiple Vitamin (MULTIVITAMIN WITH MINERALS) TABS tablet, Take 1 tablet by mouth daily., Disp: , Rfl:    omeprazole (PRILOSEC OTC) 20 MG tablet, Take 20 mg by mouth daily as needed (heartburn)., Disp: , Rfl:    ONETOUCH ULTRA test strip, , Disp: , Rfl:    oxyCODONE-acetaminophen (PERCOCET/ROXICET) 5-325 MG tablet, Take 1 tablet by mouth every 4 (four) hours as needed for severe pain., Disp: 30 tablet, Rfl: 0   sodium chloride (OCEAN) 0.65 % SOLN nasal  spray, Place 1-2 sprays into the nose 4 (four) times  daily as needed for congestion., Disp: , Rfl:    terbinafine (LAMISIL) 250 MG tablet, Take 1 tablet (250 mg total) by mouth daily., Disp: 30 tablet, Rfl: 2   tiZANidine (ZANAFLEX) 2 MG tablet, Take 1 tablet (2 mg total) by mouth every 6 (six) hours as needed., Disp: 60 tablet, Rfl: 0   TRELEGY ELLIPTA 200-62.5-25 MCG/ACT AEPB, Take 1 puff by mouth daily., Disp: , Rfl:     Review of Systems  Constitutional:  Negative for activity change, appetite change, chills, diaphoresis, fatigue, fever and unexpected weight change.  HENT:  Negative for congestion, rhinorrhea, sinus pressure, sneezing, sore throat and trouble swallowing.   Eyes:  Negative for photophobia and visual disturbance.  Respiratory:  Negative for cough, chest tightness, shortness of breath, wheezing and stridor.   Cardiovascular:  Negative for chest pain, palpitations and leg swelling.  Gastrointestinal:  Negative for abdominal distention, abdominal pain, anal bleeding, blood in stool, constipation, diarrhea, nausea and vomiting.  Genitourinary:  Negative for difficulty urinating, dysuria, flank pain and hematuria.  Musculoskeletal:  Negative for arthralgias, back pain, gait problem, joint swelling and myalgias.  Skin:  Negative for color change, pallor, rash and wound.  Neurological:  Negative for dizziness, tremors, weakness and light-headedness.  Hematological:  Negative for adenopathy. Does not bruise/bleed easily.  Psychiatric/Behavioral:  Negative for agitation, behavioral problems, confusion, decreased concentration, dysphoric mood and sleep disturbance.        Objective:   Physical Exam Constitutional:      General: She is not in acute distress.    Appearance: Normal appearance. She is well-developed. She is not ill-appearing or diaphoretic.  HENT:     Head: Normocephalic and atraumatic.     Right Ear: Carpenter and external ear normal.     Left Ear: Carpenter and external ear normal.     Nose: No nasal deformity or  rhinorrhea.  Eyes:     General: No scleral icterus.    Conjunctiva/sclera: Conjunctivae normal.     Right eye: Right conjunctiva is not injected.     Left eye: Left conjunctiva is not injected.     Pupils: Pupils are equal, round, and reactive to light.  Neck:     Vascular: No JVD.  Cardiovascular:     Rate and Rhythm: Normal rate and regular rhythm.     Heart sounds: S1 normal and S2 normal.  Pulmonary:     Effort: Pulmonary effort is normal. No respiratory distress.     Breath sounds: No wheezing.  Abdominal:     General: Bowel sounds are normal. There is no distension.     Palpations: Abdomen is soft.     Tenderness: There is no abdominal tenderness.  Musculoskeletal:        General: Normal range of motion.     Right shoulder: Normal.     Left shoulder: Normal.     Cervical back: Normal range of motion and neck supple.     Right hip: Normal.     Left hip: Normal.     Right knee: Normal.     Left knee: Normal.  Lymphadenopathy:     Head:     Right side of head: No submandibular, preauricular or posterior auricular adenopathy.     Left side of head: No submandibular, preauricular or posterior auricular adenopathy.     Cervical: No cervical adenopathy.     Right cervical: No superficial or deep cervical adenopathy.  Left cervical: No superficial or deep cervical adenopathy.  Skin:    General: Skin is warm and dry.     Coloration: Skin is not pale.     Findings: No abrasion, bruising, ecchymosis, erythema, lesion or rash.     Nails: There is no clubbing.  Neurological:     Mental Status: She is alert and oriented to person, place, and time.     Sensory: No sensory deficit.     Coordination: Coordination normal.     Gait: Gait normal.  Psychiatric:        Attention and Perception: She is attentive.        Mood and Affect: Mood normal.        Speech: Speech normal.        Behavior: Behavior normal. Behavior is cooperative.        Thought Content: Thought content  normal.        Judgment: Judgment normal.            Assessment & Plan:    MSSA T spine infecction with epidural abscess sp NS  She has had MORE than a year of antibiotics  IF her ESR and CRP are reassuring we will trial her off of antibiotics though I want her to keep her bottle of cefadroxil with her in case her pain and infection recurs  I will see her again in 3 months time.  RA: she is seeing Rheumatology. I would not recommend hazarding potent biologics with her  I have personally spent 27 minutes involved in face-to-face and non-face-to-face activities for this patient on the day of the visit. Professional time spent includes the following activities: Preparing to see the patient (review of tests), Obtaining and/or reviewing separately obtained history (admission/discharge record), Performing a medically appropriate examination and/or evaluation , Ordering medications/tests/procedures, referring and communicating with other health care professionals, Documenting clinical information in the EMR, Independently interpreting results (not separately reported), Communicating results to the patient/family/caregiver, Counseling and educating the patient/family/caregiver and Care coordination (not separately reported).

## 2023-05-21 LAB — BASIC METABOLIC PANEL WITH GFR
BUN: 21 mg/dL (ref 7–25)
CO2: 25 mmol/L (ref 20–32)
Calcium: 9.7 mg/dL (ref 8.6–10.4)
Chloride: 102 mmol/L (ref 98–110)
Creat: 0.9 mg/dL (ref 0.60–1.00)
Glucose, Bld: 105 mg/dL — ABNORMAL HIGH (ref 65–99)
Potassium: 4.3 mmol/L (ref 3.5–5.3)
Sodium: 140 mmol/L (ref 135–146)
eGFR: 67 mL/min/{1.73_m2} (ref >=60)

## 2023-05-21 LAB — CBC WITH DIFFERENTIAL/PLATELET
Absolute Monocytes: 504 cells/uL (ref 200–950)
Basophils Absolute: 64 cells/uL (ref 0–200)
Basophils Relative: 0.8 %
Eosinophils Absolute: 168 cells/uL (ref 15–500)
Eosinophils Relative: 2.1 %
HCT: 34.9 % — ABNORMAL LOW (ref 35.0–45.0)
Hemoglobin: 11.6 g/dL — ABNORMAL LOW (ref 11.7–15.5)
Lymphs Abs: 2080 cells/uL (ref 850–3900)
MCH: 26.6 pg — ABNORMAL LOW (ref 27.0–33.0)
MCHC: 33.2 g/dL (ref 32.0–36.0)
MCV: 80 fL (ref 80.0–100.0)
MPV: 10.9 fL (ref 7.5–12.5)
Monocytes Relative: 6.3 %
Neutro Abs: 5184 cells/uL (ref 1500–7800)
Neutrophils Relative %: 64.8 %
Platelets: 347 10*3/uL (ref 140–400)
RBC: 4.36 10*6/uL (ref 3.80–5.10)
RDW: 16.9 % — ABNORMAL HIGH (ref 11.0–15.0)
Total Lymphocyte: 26 %
WBC: 8 10*3/uL (ref 3.8–10.8)

## 2023-05-21 LAB — C-REACTIVE PROTEIN: CRP: 4.7 mg/L (ref <8.0)

## 2023-05-21 LAB — SEDIMENTATION RATE: Sed Rate: 25 mm/h (ref 0–30)

## 2023-06-08 DIAGNOSIS — M25551 Pain in right hip: Secondary | ICD-10-CM | POA: Diagnosis not present

## 2023-06-08 DIAGNOSIS — M79643 Pain in unspecified hand: Secondary | ICD-10-CM | POA: Diagnosis not present

## 2023-06-08 DIAGNOSIS — M4620 Osteomyelitis of vertebra, site unspecified: Secondary | ICD-10-CM | POA: Diagnosis not present

## 2023-06-08 DIAGNOSIS — M1990 Unspecified osteoarthritis, unspecified site: Secondary | ICD-10-CM | POA: Diagnosis not present

## 2023-06-08 DIAGNOSIS — Z79899 Other long term (current) drug therapy: Secondary | ICD-10-CM | POA: Diagnosis not present

## 2023-06-08 DIAGNOSIS — M0609 Rheumatoid arthritis without rheumatoid factor, multiple sites: Secondary | ICD-10-CM | POA: Diagnosis not present

## 2023-06-08 DIAGNOSIS — M25512 Pain in left shoulder: Secondary | ICD-10-CM | POA: Diagnosis not present

## 2023-07-08 ENCOUNTER — Telehealth: Payer: Self-pay

## 2023-07-08 NOTE — Telephone Encounter (Signed)
Patient called office today stating she noticed back tightness Tuesday. Has improved some. Denies back pain, fever, chills.  Would like to know if she needs to come in for evaluation.  Requested she monitor for now.  Juanita Laster, RMA

## 2023-07-12 ENCOUNTER — Ambulatory Visit: Payer: PPO | Admitting: Podiatry

## 2023-07-12 DIAGNOSIS — L84 Corns and callosities: Secondary | ICD-10-CM

## 2023-07-12 DIAGNOSIS — E1151 Type 2 diabetes mellitus with diabetic peripheral angiopathy without gangrene: Secondary | ICD-10-CM | POA: Diagnosis not present

## 2023-07-12 DIAGNOSIS — B351 Tinea unguium: Secondary | ICD-10-CM

## 2023-07-12 NOTE — Progress Notes (Signed)
Subjective:  Patient ID: [name], female    DOB: [birth year],  MRN: [id number]  [name] presents to clinic today for:  Chief Complaint  Patient presents with   Nail Problem    [facility] [id number] this am  A1C-5.8 07/23/22 PCP-Ramachandran PCP VST- 03/17/23  Callus fifth toe left foot ,  cool feeling bottom of left foot at night-  Dr. Daiva Eves gave oral medication for toenails for 84 days-  it helped clear up some of her toenails.      . Patient notes nails are thick and elongated, causing pain in shoe gear when ambulating.  Patient also has painful corns and calluses.  PCP is Georgianne Fick, MD. date last seen was 03/17/2023  Past Medical History:  Diagnosis Date   Anxiety    Arthritis    "knees, ankles, fingers" (07/06/2018)   Asthma    Chronic lower back pain    Dyspnea    with asthma attacks    Dysrhythmia    Family history of adverse reaction to anesthesia    my first cousin had difficulty waking up    Fibroid    ovarian   GERD (gastroesophageal reflux disease)    Hip osteoarthritis    Left   History of staph infection    in hospital for 11 days/ in 2007   Hyperlipidemia    Hypertension    MSSA bacteremia 05/20/2023   Onychomycosis 01/20/2023   Radiculopathy    Tremor 03/30/2022   Type II diabetes mellitus (HCC)    Upper extremity weakness 03/30/2022   Wears glasses     Allergies  Allergen Reactions   Ivp Dye [Iodinated Contrast Media] Nausea And Vomiting   Prednisone Other (See Comments)    reflux   Review of Systems: Negative except as noted in the HPI.  Objective:  There were no vitals filed for this visit.  [name] is a pleasant 77 y.o. female in NAD. AAO x 3.  Vascular Examination: Patient has palpable DP pulse, absent PT pulse bilateral.  Delayed capillary refill bilateral toes.  Sparse digital hair bilateral.  Proximal to distal cooling WNL bilateral.    Dermatological Examination: Interspaces are clear with no open  lesions noted bilateral.  Skin is shiny and atrophic bilateral.  Nails are 3-48mm thick, with yellowish/brown discoloration, subungual debris and distal onycholysis x10.  There is pain with compression of nails x10.  There are hyperkeratotic lesions noted on the dorsal lateral aspect of the left fifth toe at the PIP joint, as well as the plantar medial aspect of the first MPJ bilateral..      Latest Ref Rng & Units 07/23/2022    9:53 AM  Hemoglobin A1C  Hemoglobin-A1c 4.8 - 5.6 % 5.8    Patient qualifies for at-risk foot care because of type 2 diabetes with PVD.  Assessment/Plan: 1. Callus   2. Type II diabetes mellitus with peripheral circulatory disorder (HCC)   3. Dermatophytosis of nail    Mycotic nails x10 were sharply debrided with sterile nail nippers and power debriding burr to decrease bulk and length.  Hyperkeratotic lesions x 3 were shaved with #312 blade.   Return in about 3 months (around 10/12/2023) for [facility]   Clerance Lav, DPM, FACFAS [facility]     [address]  [place], [place] [address]                Office [phone]  Fax [phone]

## 2023-07-28 DIAGNOSIS — E1142 Type 2 diabetes mellitus with diabetic polyneuropathy: Secondary | ICD-10-CM | POA: Diagnosis not present

## 2023-07-28 DIAGNOSIS — E782 Mixed hyperlipidemia: Secondary | ICD-10-CM | POA: Diagnosis not present

## 2023-07-29 ENCOUNTER — Other Ambulatory Visit: Payer: Self-pay

## 2023-07-29 ENCOUNTER — Encounter: Payer: Self-pay | Admitting: Infectious Disease

## 2023-07-29 ENCOUNTER — Ambulatory Visit: Payer: PPO | Admitting: Infectious Disease

## 2023-07-29 VITALS — BP 127/80 | HR 94 | Temp 96.3°F | Ht <= 58 in

## 2023-07-29 DIAGNOSIS — G062 Extradural and subdural abscess, unspecified: Secondary | ICD-10-CM

## 2023-07-29 DIAGNOSIS — Z7185 Encounter for immunization safety counseling: Secondary | ICD-10-CM

## 2023-07-29 DIAGNOSIS — M4620 Osteomyelitis of vertebra, site unspecified: Secondary | ICD-10-CM

## 2023-07-29 DIAGNOSIS — M4644 Discitis, unspecified, thoracic region: Secondary | ICD-10-CM

## 2023-07-29 DIAGNOSIS — M069 Rheumatoid arthritis, unspecified: Secondary | ICD-10-CM

## 2023-07-29 DIAGNOSIS — Z23 Encounter for immunization: Secondary | ICD-10-CM | POA: Diagnosis not present

## 2023-07-29 NOTE — Progress Notes (Signed)
Subjective:  Chief complaint: back tightness  Patient ID: [name], female    DOB: 02-01-1946, 77 y.o.   MRN: [id number]  HPI     76 year-old black woman with a past medical history significant for rheumatoid arthritis having been on etanercept and methotrexate diabetes mellitus hypertension hyperlipidemia who has had bilateral hip replacements lumbar laminectomy decompression and microdiscectomy in 2019 prior bilateral septic shoulders and MSSA bacteremia treated in 2012 or was it 2004 per patient and daughter with 6 weeks of cefazolin after successful surgery.  She presented to the ER on 25 January 2022   with acute on chronic worsening of her upper thoracic back pain.  Then seen by PCP and noted to be confused came to the ER with worsening fevers and chills.   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  She had initially been on broad-spectrum antibiotics in the form of vancomycin and cefepime and then ceftriaxone.  Blood cultures were without growth and IR guided aspirate of the disc space yielded methicillin sensitive Staphylococcus aureus.   There additionally been concern for possible cervical spine disease.  MRI of her thoracic spine was performed on Mar 26, 2022 which showed progressive findings in the thoracic spine from T8-T10 with progression of osteomyelitis with inflammation and spinal cord compression maximal at T8 with continued presence of an epidural phlegmon and dural thickening and enhancement.  She also had more degeneration of her thoracic spine 10 through 11.  MRI of the cervical spine fortunately did not show infectious pathology.  She was taken to the operating room by Dr. Patricia Nettle T7, T8, T9, T10 and T11 laminectomy for drainage of epidural abscess; T7-8, T8-9, T9-10, T10-11 posterior lateral arthrodesis with local morselized autograft bone    Cultures were unrevealing but she had been on  antibiotics.  The clock was restarted on IV antibiotics and she was placed on cefazolin.  She was dramatically better after surgery. 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 Turner Daniels is going to need to perform surgery to place at least a component of it.  She has successfully undergone surgery for this.  She had been consistently taking her cefadroxil and is not had antibiotics for MORE than a year.   Her inflammatory markers were also reassuring and we had her come off antibiotics.   When I last saw her in June she had no back pain whatsoever.   She called our clinic roughly a week ago due to recurrence of pain and tightness in her upper back that radiated underneath her arm.  She had similar symptoms of pain that radiated into her abdomen when she had prior thoracolumbar discitis.  She had interestingly had recent worsening of her rheumatoid arthritis and started methotrexate several weeks prior to this.  She also has had some symptoms of feeling quite cold at night at times but found that she has not been running a fever or had a low temperature.  She is under quite a bit of stress due to the fact that her husband who has advanced dementia is living in a nursing home and he is claiming that he is ready to come home and she is not going to be capable of caring for him in his current condition.       Past Medical History:  Diagnosis Date   Anxiety  Arthritis    "knees, ankles, fingers" (07/06/2018)   Asthma    Chronic lower back pain    Dyspnea    with asthma attacks    Dysrhythmia    Family history of adverse reaction to anesthesia    my first cousin had difficulty waking up    Fibroid    ovarian   GERD (gastroesophageal reflux disease)    Hip osteoarthritis    Left   History of staph infection    in hospital for 11 days/ in 2007   Hyperlipidemia    Hypertension    MSSA bacteremia  05/20/2023   Onychomycosis 01/20/2023   Radiculopathy    Tremor 03/30/2022   Type II diabetes mellitus (HCC)    Upper extremity weakness 03/30/2022   Wears glasses     Past Surgical History:  Procedure Laterality Date   ANTERIOR CERVICAL DECOMP/DISCECTOMY FUSION N/A 12/11/2015   Procedure: ANTERIOR CERVICAL DECOMPRESSION/DISCECTOMY FUSION 2 LEVELS;  Surgeon: Estill Bamberg, MD;  Location: [facility] OR;  Service: Orthopedics;  Laterality: N/A;  Anterior cervical decompression fusion, cervical 6-7, cervical 7-thoracic 1 with instrumentation and allograft   BACK SURGERY     CARDIAC CATHETERIZATION  1998   CATARACT EXTRACTION W/ INTRAOCULAR LENS  IMPLANT, BILATERAL Bilateral 2015   Bil   COLONOSCOPY     CRYOABLATION  1988   "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"   INCISION AND DRAINAGE Bilateral 2007>   "cleaned staph out of shoulders"   IR FLUORO GUIDED NEEDLE PLC ASPIRATION/INJECTION LOC  01/27/2022   IR FLUORO GUIDED NEEDLE PLC ASPIRATION/INJECTION LOC  01/27/2022   JOINT REPLACEMENT     LAPAROSCOPIC OVARIAN CYSTECTOMY  1970   LUMBAR LAMINECTOMY/DECOMPRESSION MICRODISCECTOMY N/A 01/31/2018   Procedure: LAMINECTOMY AND FORAMINOTOMY LUMBAR TWO- LUMBAR THREE, LUMBAR THREE- LUMBAR FOUR, LUMBAR FOUR- LUMBAR FIVE ;  Surgeon: Tressie Stalker, MD;  Location: [facility] OR;  Service: Neurosurgery;  Laterality: N/A;   LUMBAR LAMINECTOMY/DECOMPRESSION MICRODISCECTOMY N/A 04/13/2022   Procedure: Thoracic seven,Thoracic eight, Thoracic nine, Thoracic ten LAMINECTOMY;  Surgeon: Tressie Stalker, MD;  Location: [facility] OR;  Service: Neurosurgery;  Laterality: N/A;   POSTERIOR CERVICAL LAMINECTOMY  ~ 1999   SHOULDER OPEN ROTATOR CUFF REPAIR Right 02/2003   THUMB FUSION Right 2010   right thumb   TONSILLECTOMY     TOTAL ABDOMINAL HYSTERECTOMY  1998   TAH,BSO   TOTAL HIP ARTHROPLASTY Right 05/18/2018   Procedure: TOTAL HIP ARTHROPLASTY ANTERIOR APPROACH;  Surgeon: Gean Birchwood, MD;  Location: [facility] OR;   Service: Orthopedics;  Laterality: Right;   TOTAL HIP ARTHROPLASTY Left 07/06/2018   TOTAL HIP ARTHROPLASTY Left 07/06/2018   Procedure: LEFT TOTAL HIP ARTHROPLASTY ANTERIOR APPROACH;  Surgeon: Gean Birchwood, MD;  Location: [facility] OR;  Service: Orthopedics;  Laterality: Left;  Needs RNFA   TOTAL HIP REVISION Right 08/03/2022   Procedure: TOTAL HIP REVISION;  Surgeon: Gean Birchwood, MD;  Location: [facility] ORS;  Service: Orthopedics;  Laterality: Right;   TUBAL LIGATION      Family History  Problem Relation Age of Onset   Diabetes Sister    Diabetes Brother    Diabetes Brother    Diabetes Paternal Uncle    Cancer Paternal Aunt        UTERINE   Hypertension Maternal Grandmother    Heart disease Maternal Grandmother    Colon cancer Neg Hx       Social History   Socioeconomic History   Marital status: Married    Spouse name: Not on file  Number of children: Not on file   Years of education: Not on file   Highest education level: Not on file  Occupational History   Not on file  Tobacco Use   Smoking status: Former    Current packs/day: 0.00    Average packs/day: 1 pack/day for 25.0 years (25.0 ttl pk-yrs)    Types: Cigarettes    Start date: 11/24/1967    Quit date: 11/23/1992    Years since quitting: 30.6   Smokeless tobacco: Never  Vaping Use   Vaping status: Never Used  Substance and Sexual Activity   Alcohol use: Not Currently   Drug use: Never   Sexual activity: Not Currently  Other Topics Concern   Not on file  Social History Narrative   Not on file   Social Determinants of Health   Financial Resource Strain: Not on file  Food Insecurity: No Food Insecurity (05/20/2020)   Hunger Vital Sign    Worried About Running Out of Food in the Last Year: Never true    Ran Out of Food in the Last Year: Never true  Transportation Needs: No Transportation Needs (05/20/2020)   PRAPARE - Administrator, Civil Service (Medical): No    Lack of Transportation (Non-Medical): No   Physical Activity: Not on file  Stress: Not on file  Social Connections: Not on file    Allergies  Allergen Reactions   Ivp Dye [Iodinated Contrast Media] Nausea And Vomiting   Prednisone Other (See Comments)    reflux     Current Outpatient Medications:    albuterol (ACCUNEB) 1.25 MG/3ML nebulizer solution, Take 1 ampule by nebulization every 4 (four) hours as needed for wheezing., Disp: , Rfl:    albuterol (VENTOLIN HFA) 108 (90 Base) MCG/ACT inhaler, Inhale 2 puffs into the lungs every 6 (six) hours as needed for wheezing or shortness of breath., Disp: , Rfl:    aspirin EC 81 MG tablet, Take 1 tablet (81 mg total) by mouth 2 (two) times daily. (Patient not taking: Reported on 05/20/2023), Disp: 60 tablet, Rfl: 0   Blood Glucose Monitoring Suppl (ONE TOUCH ULTRA 2) w/Device KIT, Check blood sugar twice per week, Disp: , Rfl:    bumetanide (BUMEX) 1 MG tablet, Take 1 mg by mouth 2 (two) times daily., Disp: , Rfl:    cetirizine (ZYRTEC) 10 MG tablet, Take 10 mg by mouth daily., Disp: , Rfl:    cholecalciferol (VITAMIN D3) 25 MCG (1000 UNIT) tablet, Take 1,000 Units by mouth daily., Disp: , Rfl:    diclofenac Sodium (VOLTAREN) 1 % GEL, Apply 4 g topically 4 (four) times daily. (Patient taking differently: Apply 4 g topically daily as needed (pain).), Disp: 100 g, Rfl: 0   diltiazem (CARDIZEM CD) 180 MG 24 hr capsule, Take 180 mg by mouth daily., Disp: , Rfl:    enalapril (VASOTEC) 20 MG tablet, Take 20 mg by mouth 2 (two) times daily., Disp: , Rfl:    folic acid (FOLVITE) 800 MCG tablet, Take 800 mcg by mouth daily., Disp: , Rfl:    gabapentin (NEURONTIN) 300 MG capsule, Take 300 mg by mouth at bedtime., Disp: , Rfl:    glucose blood test strip, , Disp: , Rfl:    hydrOXYzine (ATARAX) 10 MG tablet, Take 10-20 mg by mouth 2 (two) times daily as needed for anxiety., Disp: , Rfl:    Lancets (ONETOUCH DELICA PLUS LANCET33G) MISC, Use to check blood sugar, Disp: , Rfl:    lovastatin (MEVACOR)  40 MG tablet, Take 40 mg by mouth at bedtime. , Disp: , Rfl:    meclizine (ANTIVERT) 12.5 MG tablet, Take 12.5 mg by mouth 2 (two) times daily as needed for dizziness., Disp: , Rfl:    metFORMIN (GLUCOPHAGE-XR) 500 MG 24 hr tablet, Take 500 mg by mouth in the morning and at bedtime., Disp: , Rfl:    Multiple Vitamin (MULTIVITAMIN WITH MINERALS) TABS tablet, Take 1 tablet by mouth daily., Disp: , Rfl:    omeprazole (PRILOSEC OTC) 20 MG tablet, Take 20 mg by mouth daily as needed (heartburn)., Disp: , Rfl:    ONETOUCH ULTRA test strip, , Disp: , Rfl:    oxyCODONE-acetaminophen (PERCOCET/ROXICET) 5-325 MG tablet, Take 1 tablet by mouth every 4 (four) hours as needed for severe pain. (Patient not taking: Reported on 05/20/2023), Disp: 30 tablet, Rfl: 0   sodium chloride (OCEAN) 0.65 % SOLN nasal spray, Place 1-2 sprays into the nose 4 (four) times daily as needed for congestion., Disp: , Rfl:    terbinafine (LAMISIL) 250 MG tablet, Take 1 tablet (250 mg total) by mouth daily., Disp: 30 tablet, Rfl: 2   tiZANidine (ZANAFLEX) 2 MG tablet, Take 1 tablet (2 mg total) by mouth every 6 (six) hours as needed., Disp: 60 tablet, Rfl: 0   TRELEGY ELLIPTA 200-62.5-25 MCG/ACT AEPB, Take 1 puff by mouth daily., Disp: , Rfl:     Review of Systems  Constitutional:  Positive for chills and fatigue. Negative for activity change, appetite change, diaphoresis, fever and unexpected weight change.  HENT:  Negative for congestion, rhinorrhea, sinus pressure, sneezing, sore throat and trouble swallowing.   Eyes:  Negative for photophobia and visual disturbance.  Respiratory:  Negative for cough, chest tightness, shortness of breath, wheezing and stridor.   Cardiovascular:  Negative for chest pain, palpitations and leg swelling.  Gastrointestinal:  Negative for abdominal distention, abdominal pain, anal bleeding, blood in stool, constipation, diarrhea, nausea and vomiting.  Genitourinary:  Negative for difficulty urinating,  dysuria, flank pain and hematuria.  Musculoskeletal:  Positive for arthralgias and back pain. Negative for gait problem, joint swelling and myalgias.  Skin:  Negative for color change, pallor, rash and wound.  Neurological:  Negative for dizziness, tremors, weakness and light-headedness.  Hematological:  Negative for adenopathy. Does not bruise/bleed easily.  Psychiatric/Behavioral:  Negative for agitation, behavioral problems, confusion, decreased concentration, dysphoric mood and sleep disturbance. The patient is nervous/anxious.        Objective:   Physical Exam Constitutional:      General: She is not in acute distress.    Appearance: Normal appearance. She is well-developed. She is not ill-appearing or diaphoretic.  HENT:     Head: Normocephalic and atraumatic.     Right Ear: Hearing and external ear normal.     Left Ear: Hearing and external ear normal.     Nose: No nasal deformity or rhinorrhea.  Eyes:     General: No scleral icterus.    Conjunctiva/sclera: Conjunctivae normal.     Right eye: Right conjunctiva is not injected.     Left eye: Left conjunctiva is not injected.     Pupils: Pupils are equal, round, and reactive to light.  Neck:     Vascular: No JVD.  Cardiovascular:     Rate and Rhythm: Normal rate and regular rhythm.     Heart sounds: S1 normal and S2 normal.  Pulmonary:     Effort: Pulmonary effort is normal. No respiratory distress.  Breath sounds: No wheezing.  Abdominal:     General: There is no distension.     Palpations: Abdomen is soft.  Musculoskeletal:        General: Normal range of motion.     Right shoulder: Normal.     Left shoulder: Normal.     Cervical back: Normal range of motion and neck supple.     Right hip: Normal.     Left hip: Normal.     Right knee: Normal.     Left knee: Normal.  Lymphadenopathy:     Head:     Right side of head: No submandibular, preauricular or posterior auricular adenopathy.     Left side of head: No  submandibular, preauricular or posterior auricular adenopathy.     Cervical: No cervical adenopathy.     Right cervical: No superficial or deep cervical adenopathy.    Left cervical: No superficial or deep cervical adenopathy.  Skin:    General: Skin is warm and dry.     Coloration: Skin is not pale.     Findings: No abrasion, bruising, ecchymosis, erythema, lesion or rash.     Nails: There is no clubbing.  Neurological:     General: No focal deficit present.     Mental Status: She is alert and oriented to person, place, and time.     Sensory: No sensory deficit.     Coordination: Coordination normal.     Gait: Gait normal.  Psychiatric:        Attention and Perception: She is attentive.        Mood and Affect: Mood normal.        Speech: Speech normal.        Behavior: Behavior normal. Behavior is cooperative.        Thought Content: Thought content normal.        Judgment: Judgment normal.            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  It has responded to Mobic which was prescribed by her rheumatologist.  That being said I think we certainly need to check some inflammatory markers.  Will also check blood cultures given her symptoms of feeling cold at night.  I am holding off on an MRI until I have more data such as profoundly elevated inflammatory markers or persistence of her symptoms of pain I going to see her towards the end of this month for follow-up so we can circle back on how she is doing.  Rheumatoid arthritis: Following with with rheumatology and on methotrexate again after worsening of her symptoms off treatment  Certainly the RA can confound our  interpretation of inflammatory markers  Anxiety related to her husband's condition: Certainly understandable I have provided empathetic support.  Vaccine counseling and recommended updated flu vaccine.

## 2023-08-04 DIAGNOSIS — N182 Chronic kidney disease, stage 2 (mild): Secondary | ICD-10-CM | POA: Diagnosis not present

## 2023-08-04 DIAGNOSIS — E782 Mixed hyperlipidemia: Secondary | ICD-10-CM | POA: Diagnosis not present

## 2023-08-04 DIAGNOSIS — Z23 Encounter for immunization: Secondary | ICD-10-CM | POA: Diagnosis not present

## 2023-08-04 DIAGNOSIS — M0609 Rheumatoid arthritis without rheumatoid factor, multiple sites: Secondary | ICD-10-CM | POA: Diagnosis not present

## 2023-08-04 DIAGNOSIS — I10 Essential (primary) hypertension: Secondary | ICD-10-CM | POA: Diagnosis not present

## 2023-08-04 DIAGNOSIS — E1142 Type 2 diabetes mellitus with diabetic polyneuropathy: Secondary | ICD-10-CM | POA: Diagnosis not present

## 2023-08-04 DIAGNOSIS — I700 Atherosclerosis of aorta: Secondary | ICD-10-CM | POA: Diagnosis not present

## 2023-08-04 DIAGNOSIS — J432 Centrilobular emphysema: Secondary | ICD-10-CM | POA: Diagnosis not present

## 2023-08-04 LAB — COMPLETE METABOLIC PANEL WITH GFR
AG Ratio: 1.7 (calc) (ref 1.0–2.5)
ALT: 20 U/L (ref 6–29)
AST: 20 U/L (ref 10–35)
Albumin: 4.8 g/dL (ref 3.6–5.1)
Alkaline phosphatase (APISO): 98 U/L (ref 37–153)
BUN: 20 mg/dL (ref 7–25)
CO2: 29 mmol/L (ref 20–32)
Calcium: 10.2 mg/dL (ref 8.6–10.4)
Chloride: 99 mmol/L (ref 98–110)
Creat: 0.99 mg/dL (ref 0.60–1.00)
Globulin: 2.9 g/dL (ref 1.9–3.7)
Glucose, Bld: 100 mg/dL — ABNORMAL HIGH (ref 65–99)
Potassium: 3.7 mmol/L (ref 3.5–5.3)
Sodium: 143 mmol/L (ref 135–146)
Total Bilirubin: 0.4 mg/dL (ref 0.2–1.2)
Total Protein: 7.7 g/dL (ref 6.1–8.1)
eGFR: 59 mL/min/{1.73_m2} — ABNORMAL LOW (ref >=60)

## 2023-08-04 LAB — CBC WITH DIFFERENTIAL/PLATELET
Absolute Monocytes: 590 {cells}/uL (ref 200–950)
Basophils Absolute: 62 {cells}/uL (ref 0–200)
Basophils Relative: 0.7 %
Eosinophils Absolute: 70 {cells}/uL (ref 15–500)
Eosinophils Relative: 0.8 %
HCT: 39.8 % (ref 35.0–45.0)
Hemoglobin: 12.7 g/dL (ref 11.7–15.5)
Lymphs Abs: 1786 {cells}/uL (ref 850–3900)
MCH: 26.6 pg — ABNORMAL LOW (ref 27.0–33.0)
MCHC: 31.9 g/dL — ABNORMAL LOW (ref 32.0–36.0)
MCV: 83.3 fL (ref 80.0–100.0)
MPV: 10.3 fL (ref 7.5–12.5)
Monocytes Relative: 6.7 %
Neutro Abs: 6292 {cells}/uL (ref 1500–7800)
Neutrophils Relative %: 71.5 %
Platelets: 441 10*3/uL — ABNORMAL HIGH (ref 140–400)
RBC: 4.78 10*6/uL (ref 3.80–5.10)
RDW: 15.7 % — ABNORMAL HIGH (ref 11.0–15.0)
Total Lymphocyte: 20.3 %
WBC: 8.8 10*3/uL (ref 3.8–10.8)

## 2023-08-04 LAB — CULTURE, BLOOD (SINGLE)
MICRO NUMBER:: 15427265
MICRO NUMBER:: 15427266
Result:: NO GROWTH
SPECIMEN QUALITY:: ADEQUATE

## 2023-08-04 LAB — SEDIMENTATION RATE: Sed Rate: 25 mm/h (ref 0–30)

## 2023-08-04 LAB — C-REACTIVE PROTEIN: CRP: 5 mg/L (ref <8.0)

## 2023-08-16 ENCOUNTER — Ambulatory Visit
Admission: EM | Admit: 2023-08-16 | Discharge: 2023-08-16 | Disposition: A | Discharge status: Home / Self Care | Payer: PPO | Attending: Internal Medicine | Admitting: Internal Medicine

## 2023-08-16 DIAGNOSIS — R220 Localized swelling, mass and lump, head: Secondary | ICD-10-CM

## 2023-08-16 DIAGNOSIS — I10 Essential (primary) hypertension: Secondary | ICD-10-CM | POA: Diagnosis not present

## 2023-08-16 DIAGNOSIS — T783XXA Angioneurotic edema, initial encounter: Secondary | ICD-10-CM | POA: Diagnosis not present

## 2023-08-16 MED ORDER — HYDROCHLOROTHIAZIDE 12.5 MG PO TABS
12.5000 mg | ORAL_TABLET | Freq: Every day | ORAL | 0 refills | Status: AC
Start: 2023-08-16 — End: ?

## 2023-08-16 NOTE — ED Triage Notes (Signed)
Pt reports swelling in face and lips since 06:15 am today. Denies any trouble breathing, talking, swallowing.

## 2023-08-16 NOTE — Discharge Instructions (Addendum)
Stop enalapril today. This is a class of medications called Ace inhibitors that can cause Ace inhibitor induced angioedema. In its place start hydrochlorothiazide once daily. Keep taking diltiazem. Please follow up with your PCP asap.   For the possibility of an allergic reaction and any itching, hives that develop go ahead and take Benadryl. If you cannot take it because of drowsiness, then I would take Claritin with your daily Zyrtec.

## 2023-08-16 NOTE — ED Provider Notes (Signed)
[facility]  Note:  This document was prepared using Conservation officer, historic buildings and may include unintentional dictation errors.  MRN: [id number] DOB: Mar 10, 1946  Subjective:   [name] is a 77 y.o. female presenting for acute onset of swelling to the lips this morning, worse over the upper. She tried a new batch of [place] over the weekend and is wondering if this played a role.  Denies eating any new foods, starting new medications, exposure to poisonous plants, new hygiene products, new cleaning products or detergents.  Patient does take enalapril and has been on this for years.  Today, she denies any particular hives, itching.  She does have some intermittent wheezing but is not abnormal for the patient as she has asthma, COPD.  She has not had to use her inhalers or breathing treatments more than she normally does.  She did have some sinus pressure over both sides worse on the right over the weekend.  Has also had an intermittent right-sided headache.  No weakness, numbness or tingling, confusion.  No history of stroke or cerebrovascular disease.  No current facility-administered medications for this encounter.  Current Outpatient Medications:    albuterol (ACCUNEB) 1.25 MG/3ML nebulizer solution, Take 1 ampule by nebulization every 4 (four) hours as needed for wheezing., Disp: , Rfl:    albuterol (VENTOLIN HFA) 108 (90 Base) MCG/ACT inhaler, Inhale 2 puffs into the lungs every 6 (six) hours as needed for wheezing or shortness of breath., Disp: , Rfl:    aspirin EC 81 MG tablet, Take 1 tablet (81 mg total) by mouth 2 (two) times daily., Disp: 60 tablet, Rfl: 0   Blood Glucose Monitoring Suppl (ONE TOUCH ULTRA 2) w/Device KIT, Check blood sugar twice per week, Disp: , Rfl:    bumetanide (BUMEX) 1 MG tablet, Take 1 mg by mouth 2 (two) times daily., Disp: , Rfl:    cetirizine (ZYRTEC) 10 MG tablet, Take 10 mg by mouth daily., Disp: , Rfl:     cholecalciferol (VITAMIN D3) 25 MCG (1000 UNIT) tablet, Take 1,000 Units by mouth daily., Disp: , Rfl:    diclofenac (VOLTAREN) 75 MG EC tablet, Take 75 mg by mouth 2 (two) times daily., Disp: , Rfl:    diclofenac Sodium (VOLTAREN) 1 % GEL, Apply 4 g topically 4 (four) times daily. (Patient taking differently: Apply 4 g topically daily as needed (pain).), Disp: 100 g, Rfl: 0   diltiazem (CARDIZEM CD) 180 MG 24 hr capsule, Take 180 mg by mouth daily., Disp: , Rfl:    enalapril (VASOTEC) 20 MG tablet, Take 20 mg by mouth 2 (two) times daily., Disp: , Rfl:    folic acid (FOLVITE) 800 MCG tablet, Take 800 mcg by mouth daily., Disp: , Rfl:    gabapentin (NEURONTIN) 300 MG capsule, Take 300 mg by mouth at bedtime., Disp: , Rfl:    glucose blood test strip, , Disp: , Rfl:    hydrOXYzine (ATARAX) 10 MG tablet, Take 10-20 mg by mouth 2 (two) times daily as needed for anxiety., Disp: , Rfl:    Lancets (ONETOUCH DELICA PLUS LANCET33G) MISC, Use to check blood sugar, Disp: , Rfl:    lovastatin (MEVACOR) 40 MG tablet, Take 40 mg by mouth at bedtime. , Disp: , Rfl:    meclizine (ANTIVERT) 12.5 MG tablet, Take 12.5 mg by mouth 2 (two) times daily as needed for dizziness., Disp: , Rfl:    metFORMIN (GLUCOPHAGE-XR) 500 MG 24 hr tablet, Take 500  mg by mouth in the morning and at bedtime., Disp: , Rfl:    methotrexate (RHEUMATREX) 2.5 MG tablet, TAKE 6 TABLETS BY MOUTH 1 TIME A WEEK Orally once a week for 90 days, Disp: , Rfl:    Multiple Vitamin (MULTIVITAMIN WITH MINERALS) TABS tablet, Take 1 tablet by mouth daily., Disp: , Rfl:    omeprazole (PRILOSEC OTC) 20 MG tablet, Take 20 mg by mouth daily as needed (heartburn)., Disp: , Rfl:    ONETOUCH ULTRA test strip, , Disp: , Rfl:    sodium chloride (OCEAN) 0.65 % SOLN nasal spray, Place 1-2 sprays into the nose 4 (four) times daily as needed for congestion., Disp: , Rfl:    terbinafine (LAMISIL) 250 MG tablet, Take 1 tablet (250 mg total) by mouth daily., Disp: 30  tablet, Rfl: 2   TRELEGY ELLIPTA 200-62.5-25 MCG/ACT AEPB, Take 1 puff by mouth daily., Disp: , Rfl:    Allergies  Allergen Reactions   Ivp Dye [Iodinated Contrast Media] Nausea And Vomiting   Prednisone Other (See Comments)    reflux    Past Medical History:  Diagnosis Date   Anxiety    Arthritis    "knees, ankles, fingers" (07/06/2018)   Asthma    Chronic lower back pain    Dyspnea    with asthma attacks    Dysrhythmia    Family history of adverse reaction to anesthesia    my first cousin had difficulty waking up    Fibroid    ovarian   GERD (gastroesophageal reflux disease)    Hip osteoarthritis    Left   History of staph infection    in hospital for 11 days/ in 2007   Hyperlipidemia    Hypertension    MSSA bacteremia 05/20/2023   Onychomycosis 01/20/2023   Radiculopathy    Tremor 03/30/2022   Type II diabetes mellitus (HCC)    Upper extremity weakness 03/30/2022   Wears glasses      Past Surgical History:  Procedure Laterality Date   ANTERIOR CERVICAL DECOMP/DISCECTOMY FUSION N/A 12/11/2015   Procedure: ANTERIOR CERVICAL DECOMPRESSION/DISCECTOMY FUSION 2 LEVELS;  Surgeon: Estill Bamberg, MD;  Location: [facility] OR;  Service: Orthopedics;  Laterality: N/A;  Anterior cervical decompression fusion, cervical 6-7, cervical 7-thoracic 1 with instrumentation and allograft   BACK SURGERY     CARDIAC CATHETERIZATION  1998   CATARACT EXTRACTION W/ INTRAOCULAR LENS  IMPLANT, BILATERAL Bilateral 2015   Bil   COLONOSCOPY     CRYOABLATION  1988   "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"   INCISION AND DRAINAGE Bilateral 2007>   "cleaned staph out of shoulders"   IR FLUORO GUIDED NEEDLE PLC ASPIRATION/INJECTION LOC  01/27/2022   IR FLUORO GUIDED NEEDLE PLC ASPIRATION/INJECTION LOC  01/27/2022   JOINT REPLACEMENT     LAPAROSCOPIC OVARIAN CYSTECTOMY  1970   LUMBAR LAMINECTOMY/DECOMPRESSION MICRODISCECTOMY N/A 01/31/2018   Procedure: LAMINECTOMY AND FORAMINOTOMY LUMBAR  TWO- LUMBAR THREE, LUMBAR THREE- LUMBAR FOUR, LUMBAR FOUR- LUMBAR FIVE ;  Surgeon: Tressie Stalker, MD;  Location: [facility] OR;  Service: Neurosurgery;  Laterality: N/A;   LUMBAR LAMINECTOMY/DECOMPRESSION MICRODISCECTOMY N/A 04/13/2022   Procedure: Thoracic seven,Thoracic eight, Thoracic nine, Thoracic ten LAMINECTOMY;  Surgeon: Tressie Stalker, MD;  Location: [facility];  Service: Neurosurgery;  Laterality: N/A;   POSTERIOR CERVICAL LAMINECTOMY  ~ 1999   SHOULDER OPEN ROTATOR CUFF REPAIR Right 02/2003   THUMB FUSION Right 2010   right thumb   TONSILLECTOMY     TOTAL ABDOMINAL HYSTERECTOMY  1998   TAH,BSO   TOTAL HIP ARTHROPLASTY Right 05/18/2018   Procedure: TOTAL HIP ARTHROPLASTY ANTERIOR APPROACH;  Surgeon: Gean Birchwood, MD;  Location: [facility] OR;  Service: Orthopedics;  Laterality: Right;   TOTAL HIP ARTHROPLASTY Left 07/06/2018   TOTAL HIP ARTHROPLASTY Left 07/06/2018   Procedure: LEFT TOTAL HIP ARTHROPLASTY ANTERIOR APPROACH;  Surgeon: Gean Birchwood, MD;  Location: [facility] OR;  Service: Orthopedics;  Laterality: Left;  Needs RNFA   TOTAL HIP REVISION Right 08/03/2022   Procedure: TOTAL HIP REVISION;  Surgeon: Gean Birchwood, MD;  Location: [facility];  Service: Orthopedics;  Laterality: Right;   TUBAL LIGATION      Family History  Problem Relation Age of Onset   Diabetes Sister    Diabetes Brother    Diabetes Brother    Diabetes Paternal Uncle    Cancer Paternal Aunt        UTERINE   Hypertension Maternal Grandmother    Heart disease Maternal Grandmother    Colon cancer Neg Hx     Social History   Tobacco Use   Smoking status: Former    Current packs/day: 0.00    Average packs/day: 1 pack/day for 25.0 years (25.0 ttl pk-yrs)    Types: Cigarettes    Start date: 11/24/1967    Quit date: 11/23/1992    Years since quitting: 30.7   Smokeless tobacco: Never  Vaping Use   Vaping status: Never Used  Substance Use Topics   Alcohol use: Not Currently   Drug use: Never    ROS   Objective:    Vitals: SpO2 97%   Physical Exam Constitutional:      General: She is not in acute distress.    Appearance: Normal appearance. She is well-developed and normal weight. She is not ill-appearing, toxic-appearing or diaphoretic.  HENT:     Head: Normocephalic and atraumatic.     Right Ear: Tympanic membrane, ear canal and external ear normal. No drainage or tenderness. No middle ear effusion. There is no impacted cerumen. Tympanic membrane is not erythematous or bulging.     Left Ear: Tympanic membrane, ear canal and external ear normal. No drainage or tenderness.  No middle ear effusion. There is no impacted cerumen. Tympanic membrane is not erythematous or bulging.     Nose: Nose normal. No congestion or rhinorrhea.     Mouth/Throat:     Mouth: Mucous membranes are moist. No oral lesions.     Pharynx: No pharyngeal swelling, oropharyngeal exudate, posterior oropharyngeal erythema or uvula swelling.     Tonsils: No tonsillar exudate or tonsillar abscesses.     Comments: 1+ swelling of the upper lip.  Trace swelling of the lower lip.  No difficulty controlling secretions, patient is speaking in full sentences.  Airway is patent. Eyes:     General: No scleral icterus.       Right eye: No discharge.        Left eye: No discharge.     Extraocular Movements: Extraocular movements intact.     Right eye: Normal extraocular motion.     Left eye: Normal extraocular motion.     Conjunctiva/sclera: Conjunctivae normal.  Cardiovascular:     Rate and Rhythm: Normal rate and regular rhythm.     Heart sounds: Normal heart sounds. No murmur heard.    No friction rub. No gallop.  Pulmonary:     Effort: Pulmonary effort is normal. No respiratory distress.     Breath sounds: No stridor. No wheezing, rhonchi or  rales.  Chest:     Chest wall: No tenderness.  Musculoskeletal:     Cervical back: Normal range of motion and neck supple.  Lymphadenopathy:     Cervical: No cervical adenopathy.  Skin:     General: Skin is warm and dry.  Neurological:     General: No focal deficit present.     Mental Status: She is alert and oriented to person, place, and time.     Cranial Nerves: No cranial nerve deficit.     Motor: No weakness.     Coordination: Coordination normal.     Gait: Gait normal.     Comments: No facial asymmetry, negative Romberg and pronator drift.  Psychiatric:        Mood and Affect: Mood normal.        Behavior: Behavior normal.      Assessment and Plan :   PDMP not reviewed this encounter.  1. Swelling of upper lip   2. Angioedema, initial encounter   3. Essential hypertension    High suspicion for ACE inhibitor induced angioedema.  Recommended she stop enalapril.  We had an extensive discussion about the possibility of an allergic reaction, steroid use and the patient and I agreed to hold off on steroids in an effort to avoid increases in her blood pressure or blood sugar while also having side effects.  No signs of bacterial sinusitis.  No signs of an acute encephalopathy.  In place of her enalapril, will switch her to hydrochlorothiazide.  Follow-up with her PCP as soon as possible.  Maintain strict ER precautions.  Counseled patient on potential for adverse effects with medications prescribed today, patient verbalized understanding.    Wallis Bamberg, PA-C 08/16/23 1050

## 2023-08-23 ENCOUNTER — Encounter: Payer: Self-pay | Admitting: Infectious Disease

## 2023-08-23 ENCOUNTER — Ambulatory Visit: Payer: PPO | Admitting: Infectious Disease

## 2023-08-23 ENCOUNTER — Other Ambulatory Visit: Payer: Self-pay

## 2023-08-23 VITALS — BP 123/77 | HR 99 | Temp 98.1°F | Wt 157.0 lb

## 2023-08-23 DIAGNOSIS — S025XXB Fracture of tooth (traumatic), initial encounter for open fracture: Secondary | ICD-10-CM

## 2023-08-23 DIAGNOSIS — G062 Extradural and subdural abscess, unspecified: Secondary | ICD-10-CM | POA: Diagnosis not present

## 2023-08-23 DIAGNOSIS — Z7185 Encounter for immunization safety counseling: Secondary | ICD-10-CM

## 2023-08-23 DIAGNOSIS — M7061 Trochanteric bursitis, right hip: Secondary | ICD-10-CM | POA: Diagnosis not present

## 2023-08-23 DIAGNOSIS — M4644 Discitis, unspecified, thoracic region: Secondary | ICD-10-CM

## 2023-08-23 MED ORDER — AMOXICILLIN-POT CLAVULANATE 875-125 MG PO TABS: 1.0000 | ORAL_TABLET | Freq: Two times a day (BID) | ORAL | 1 refills | Status: AC

## 2023-08-23 NOTE — Progress Notes (Signed)
Subjective:  Chief complaint: Broken tooth  Patient ID: [name], female    DOB: 1946/10/07, 77 y.o.   MRN: [id number]  HPI     77 year-old black woman with a past medical history significant for rheumatoid arthritis having been on etanercept and methotrexate diabetes mellitus hypertension hyperlipidemia who has had bilateral hip replacements lumbar laminectomy decompression and microdiscectomy in 2019 prior bilateral septic shoulders and MSSA bacteremia treated in 2012 or was it 2004 per patient and daughter with 6 weeks of cefazolin after successful surgery.  She presented to the ER on 25 January 2022   with acute on chronic worsening of her upper thoracic back pain.  Then seen by PCP and noted to be confused came to the ER with worsening fevers and chills.   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  She had initially been on broad-spectrum antibiotics in the form of vancomycin and cefepime and then ceftriaxone.  Blood cultures were without growth and IR guided aspirate of the disc space yielded methicillin sensitive Staphylococcus aureus.   There additionally been concern for possible cervical spine disease.  MRI of her thoracic spine was performed on Mar 26, 2022 which showed progressive findings in the thoracic spine from T8-T10 with progression of osteomyelitis with inflammation and spinal cord compression maximal at T8 with continued presence of an epidural phlegmon and dural thickening and enhancement.  She also had more degeneration of her thoracic spine 10 through 11.  MRI of the cervical spine fortunately did not show infectious pathology.  She was taken to the operating room by Dr. Patricia Nettle T7, T8, T9, T10 and T11 laminectomy for drainage of epidural abscess; T7-8, T8-9, T9-10, T10-11 posterior lateral arthrodesis with local morselized autograft bone    Cultures were unrevealing but she had been on  antibiotics.  The clock was restarted on IV antibiotics and she was placed on cefazolin.  She was dramatically better after surgery. 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 Turner Daniels is going to need to perform surgery to place at least a component of it.  She has successfully undergone surgery for this.  She had been consistently taking her cefadroxil and is not had antibiotics for MORE than a year.   Her inflammatory markers were also reassuring and we had her come off antibiotics.   When I last saw her in June she had no back pain whatsoever.  She called our clinic roughly a week prior to her last visit with me in September due to to recurrence of pain and tightness in her upper back that radiated underneath her arm.  She had similar symptoms of pain that radiated into her abdomen when she had prior thoracolumbar discitis.  She had interestingly had recent worsening of her rheumatoid arthritis and started methotrexate several weeks prior to this.  She also has had some symptoms of feeling quite cold at night at times but found that she has not been running a fever or had a low temperature.  She was  under quite a bit of stress due to the fact that her husband who has advanced dementia is living in a nursing home and he is claiming that he is ready to come home and she is not going to be capable of caring for him in his current condition.  Checked her inflammatory markers that were completely normal  and we scheduled her to come see me again for today's visit.  She is not having problem with back pain now at all.  She did break a tooth and is asking if she could have an antibiotic to start to prevent an infection.       Past Medical History:  Diagnosis Date   Anxiety    Arthritis    "knees, ankles, fingers" (07/06/2018)   Asthma    Chronic lower back pain    Dyspnea    with asthma attacks     Dysrhythmia    Family history of adverse reaction to anesthesia    my first cousin had difficulty waking up    Fibroid    ovarian   GERD (gastroesophageal reflux disease)    Hip osteoarthritis    Left   History of staph infection    in hospital for 11 days/ in 2007   Hyperlipidemia    Hypertension    MSSA bacteremia 05/20/2023   Onychomycosis 01/20/2023   Radiculopathy    Tremor 03/30/2022   Type II diabetes mellitus (HCC)    Upper extremity weakness 03/30/2022   Wears glasses     Past Surgical History:  Procedure Laterality Date   ANTERIOR CERVICAL DECOMP/DISCECTOMY FUSION N/A 12/11/2015   Procedure: ANTERIOR CERVICAL DECOMPRESSION/DISCECTOMY FUSION 2 LEVELS;  Surgeon: Estill Bamberg, MD;  Location: [facility] OR;  Service: Orthopedics;  Laterality: N/A;  Anterior cervical decompression fusion, cervical 6-7, cervical 7-thoracic 1 with instrumentation and allograft   BACK SURGERY     CARDIAC CATHETERIZATION  1998   CATARACT EXTRACTION W/ INTRAOCULAR LENS  IMPLANT, BILATERAL Bilateral 2015   Bil   COLONOSCOPY     CRYOABLATION  1988   "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"   INCISION AND DRAINAGE Bilateral 2007>   "cleaned staph out of shoulders"   IR FLUORO GUIDED NEEDLE PLC ASPIRATION/INJECTION LOC  01/27/2022   IR FLUORO GUIDED NEEDLE PLC ASPIRATION/INJECTION LOC  01/27/2022   JOINT REPLACEMENT     LAPAROSCOPIC OVARIAN CYSTECTOMY  1970   LUMBAR LAMINECTOMY/DECOMPRESSION MICRODISCECTOMY N/A 01/31/2018   Procedure: LAMINECTOMY AND FORAMINOTOMY LUMBAR TWO- LUMBAR THREE, LUMBAR THREE- LUMBAR FOUR, LUMBAR FOUR- LUMBAR FIVE ;  Surgeon: Tressie Stalker, MD;  Location: [facility] OR;  Service: Neurosurgery;  Laterality: N/A;   LUMBAR LAMINECTOMY/DECOMPRESSION MICRODISCECTOMY N/A 04/13/2022   Procedure: Thoracic seven,Thoracic eight, Thoracic nine, Thoracic ten LAMINECTOMY;  Surgeon: Tressie Stalker, MD;  Location: [facility] OR;  Service: Neurosurgery;  Laterality: N/A;   POSTERIOR CERVICAL  LAMINECTOMY  ~ 1999   SHOULDER OPEN ROTATOR CUFF REPAIR Right 02/2003   THUMB FUSION Right 2010   right thumb   TONSILLECTOMY     TOTAL ABDOMINAL HYSTERECTOMY  1998   TAH,BSO   TOTAL HIP ARTHROPLASTY Right 05/18/2018   Procedure: TOTAL HIP ARTHROPLASTY ANTERIOR APPROACH;  Surgeon: Gean Birchwood, MD;  Location: [facility] OR;  Service: Orthopedics;  Laterality: Right;   TOTAL HIP ARTHROPLASTY Left 07/06/2018   TOTAL HIP ARTHROPLASTY Left 07/06/2018   Procedure: LEFT TOTAL HIP ARTHROPLASTY ANTERIOR APPROACH;  Surgeon: Gean Birchwood, MD;  Location: [facility] OR;  Service: Orthopedics;  Laterality: Left;  Needs RNFA   TOTAL HIP REVISION Right 08/03/2022   Procedure: TOTAL HIP REVISION;  Surgeon: Gean Birchwood, MD;  Location: [facility] OR[facility];  Service: Orthopedics;  Laterality: Right;   TUBAL LIGATION      Family History  Problem Relation Age of Onset   Diabetes Sister    Diabetes Brother    Diabetes Brother  Diabetes Paternal Uncle    Cancer Paternal Aunt        UTERINE   Hypertension Maternal Grandmother    Heart disease Maternal Grandmother    Colon cancer Neg Hx       Social History   Socioeconomic History   Marital status: Married    Spouse name: Not on file   Number of children: Not on file   Years of education: Not on file   Highest education level: Not on file  Occupational History   Not on file  Tobacco Use   Smoking status: Former    Current packs/day: 0.00    Average packs/day: 1 pack/day for 25.0 years (25.0 ttl pk-yrs)    Types: Cigarettes    Start date: 11/24/1967    Quit date: 11/23/1992    Years since quitting: 30.7   Smokeless tobacco: Never  Vaping Use   Vaping status: Never Used  Substance and Sexual Activity   Alcohol use: Not Currently   Drug use: Never   Sexual activity: Not Currently  Other Topics Concern   Not on file  Social History Narrative   Not on file   Social Determinants of Health   Financial Resource Strain: Not on file  Food Insecurity: No Food Insecurity  (05/20/2020)   Hunger Vital Sign    Worried About Running Out of Food in the Last Year: Never true    Ran Out of Food in the Last Year: Never true  Transportation Needs: No Transportation Needs (05/20/2020)   PRAPARE - Administrator, Civil Service (Medical): No    Lack of Transportation (Non-Medical): No  Physical Activity: Not on file  Stress: Not on file  Social Connections: Not on file    Allergies  Allergen Reactions   Enalapril Maleate Swelling   Ivp Dye [Iodinated Contrast Media] Nausea And Vomiting   Prednisone Other (See Comments)    reflux     Current Outpatient Medications:    albuterol (ACCUNEB) 1.25 MG/3ML nebulizer solution, Take 1 ampule by nebulization every 4 (four) hours as needed for wheezing., Disp: , Rfl:    albuterol (VENTOLIN HFA) 108 (90 Base) MCG/ACT inhaler, Inhale 2 puffs into the lungs every 6 (six) hours as needed for wheezing or shortness of breath., Disp: , Rfl:    aspirin EC 81 MG tablet, Take 1 tablet (81 mg total) by mouth 2 (two) times daily., Disp: 60 tablet, Rfl: 0   Blood Glucose Monitoring Suppl (ONE TOUCH ULTRA 2) w/Device KIT, Check blood sugar twice per week, Disp: , Rfl:    bumetanide (BUMEX) 1 MG tablet, Take 1 mg by mouth 2 (two) times daily., Disp: , Rfl:    cetirizine (ZYRTEC) 10 MG tablet, Take 10 mg by mouth daily., Disp: , Rfl:    cholecalciferol (VITAMIN D3) 25 MCG (1000 UNIT) tablet, Take 1,000 Units by mouth daily., Disp: , Rfl:    diclofenac (VOLTAREN) 75 MG EC tablet, Take 75 mg by mouth 2 (two) times daily., Disp: , Rfl:    diclofenac Sodium (VOLTAREN) 1 % GEL, Apply 4 g topically 4 (four) times daily. (Patient taking differently: Apply 4 g topically daily as needed (pain).), Disp: 100 g, Rfl: 0   diltiazem (CARDIZEM CD) 180 MG 24 hr capsule, Take 180 mg by mouth daily., Disp: , Rfl:    folic acid (FOLVITE) 800 MCG tablet, Take 800 mcg by mouth daily., Disp: , Rfl:    gabapentin (NEURONTIN) 300 MG capsule, Take 300 mg  by mouth at bedtime., Disp: , Rfl:    glucose blood test strip, , Disp: , Rfl:    hydrochlorothiazide (HYDRODIURIL) 12.5 MG tablet, Take 1 tablet (12.5 mg total) by mouth daily., Disp: 30 tablet, Rfl: 0   hydrOXYzine (ATARAX) 10 MG tablet, Take 10-20 mg by mouth 2 (two) times daily as needed for anxiety., Disp: , Rfl:    Lancets (ONETOUCH DELICA PLUS LANCET33G) MISC, Use to check blood sugar, Disp: , Rfl:    lovastatin (MEVACOR) 40 MG tablet, Take 40 mg by mouth at bedtime. , Disp: , Rfl:    meclizine (ANTIVERT) 12.5 MG tablet, Take 12.5 mg by mouth 2 (two) times daily as needed for dizziness., Disp: , Rfl:    metFORMIN (GLUCOPHAGE-XR) 500 MG 24 hr tablet, Take 500 mg by mouth in the morning and at bedtime., Disp: , Rfl:    methotrexate (RHEUMATREX) 2.5 MG tablet, TAKE 6 TABLETS BY MOUTH 1 TIME A WEEK Orally once a week for 90 days, Disp: , Rfl:    Multiple Vitamin (MULTIVITAMIN WITH MINERALS) TABS tablet, Take 1 tablet by mouth daily., Disp: , Rfl:    omeprazole (PRILOSEC OTC) 20 MG tablet, Take 20 mg by mouth daily as needed (heartburn)., Disp: , Rfl:    ONETOUCH ULTRA test strip, , Disp: , Rfl:    sodium chloride (OCEAN) 0.65 % SOLN nasal spray, Place 1-2 sprays into the nose 4 (four) times daily as needed for congestion., Disp: , Rfl:    terbinafine (LAMISIL) 250 MG tablet, Take 1 tablet (250 mg total) by mouth daily., Disp: 30 tablet, Rfl: 2   TRELEGY ELLIPTA 200-62.5-25 MCG/ACT AEPB, Take 1 puff by mouth daily., Disp: , Rfl:     Review of Systems  Constitutional:  Negative for activity change, appetite change, chills, diaphoresis, fatigue, fever and unexpected weight change.  HENT:  Positive for dental problem. Negative for congestion, rhinorrhea, sinus pressure, sneezing, sore throat and trouble swallowing.   Eyes:  Negative for photophobia and visual disturbance.  Respiratory:  Negative for cough, chest tightness, shortness of breath, wheezing and stridor.   Cardiovascular:  Negative  for chest pain, palpitations and leg swelling.  Gastrointestinal:  Negative for abdominal distention, abdominal pain, anal bleeding, blood in stool, constipation, diarrhea, nausea and vomiting.  Genitourinary:  Negative for difficulty urinating, dysuria, flank pain and hematuria.  Musculoskeletal:  Negative for arthralgias, back pain, gait problem, joint swelling and myalgias.  Skin:  Negative for color change, pallor, rash and wound.  Neurological:  Negative for dizziness, tremors, weakness and light-headedness.  Hematological:  Negative for adenopathy. Does not bruise/bleed easily.  Psychiatric/Behavioral:  Negative for agitation, behavioral problems, confusion, decreased concentration, dysphoric mood and sleep disturbance.        Objective:   Physical Exam Constitutional:      General: She is not in acute distress.    Appearance: Normal appearance. She is well-developed. She is not ill-appearing or diaphoretic.  HENT:     Head: Normocephalic and atraumatic.     Right Ear: Hearing and external ear normal.     Left Ear: Hearing and external ear normal.     Nose: No nasal deformity or rhinorrhea.     Mouth/Throat:     Dentition: Abnormal dentition.  Eyes:     General: No scleral icterus.    Conjunctiva/sclera: Conjunctivae normal.     Right eye: Right conjunctiva is not injected.     Left eye: Left conjunctiva is not injected.     Pupils:  Pupils are equal, round, and reactive to light.  Neck:     Vascular: No JVD.  Cardiovascular:     Rate and Rhythm: Normal rate and regular rhythm.     Heart sounds: S1 normal and S2 normal.  Pulmonary:     Effort: Pulmonary effort is normal. No respiratory distress.     Breath sounds: No wheezing.  Abdominal:     General: Bowel sounds are normal. There is no distension.     Palpations: Abdomen is soft.     Tenderness: There is no abdominal tenderness.  Musculoskeletal:        General: Normal range of motion.     Right shoulder: Normal.      Left shoulder: Normal.     Cervical back: Normal range of motion and neck supple.     Right hip: Normal.     Left hip: Normal.     Right knee: Normal.     Left knee: Normal.  Lymphadenopathy:     Head:     Right side of head: No submandibular, preauricular or posterior auricular adenopathy.     Left side of head: No submandibular, preauricular or posterior auricular adenopathy.     Cervical: No cervical adenopathy.     Right cervical: No superficial or deep cervical adenopathy.    Left cervical: No superficial or deep cervical adenopathy.  Skin:    General: Skin is warm and dry.     Coloration: Skin is not pale.     Findings: No abrasion, bruising, ecchymosis, erythema, lesion or rash.     Nails: There is no clubbing.  Neurological:     Mental Status: She is alert and oriented to person, place, and time.     Sensory: No sensory deficit.     Coordination: Coordination normal.     Gait: Gait normal.  Psychiatric:        Attention and Perception: She is attentive.        Mood and Affect: Mood normal.        Speech: Speech normal.        Behavior: Behavior normal. Behavior is cooperative.        Thought Content: Thought content normal.        Judgment: Judgment normal.            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 Rheumatoid arthritis: Following with rheumatology and on methotrexate  Vaccine counseling she is up had her updated flu and COVID vaccination and I recommended that she get RSV if she has not done so   Vaccine counseling and recommended  updated flu vaccine.

## 2023-08-24 ENCOUNTER — Telehealth: Payer: Self-pay

## 2023-08-24 LAB — CBC WITH DIFFERENTIAL/PLATELET
Absolute Monocytes: 185 {cells}/uL — ABNORMAL LOW (ref 200–950)
Basophils Absolute: 87 {cells}/uL (ref 0–200)
Basophils Relative: 0.8 %
Eosinophils Absolute: 87 {cells}/uL (ref 15–500)
Eosinophils Relative: 0.8 %
HCT: 38.2 % (ref 35.0–45.0)
Hemoglobin: 12 g/dL (ref 11.7–15.5)
Lymphs Abs: 1384 {cells}/uL (ref 850–3900)
MCH: 26.7 pg — ABNORMAL LOW (ref 27.0–33.0)
MCHC: 31.4 g/dL — ABNORMAL LOW (ref 32.0–36.0)
MCV: 85.1 fL (ref 80.0–100.0)
MPV: 9.9 fL (ref 7.5–12.5)
Monocytes Relative: 1.7 %
Neutro Abs: 9156 {cells}/uL — ABNORMAL HIGH (ref 1500–7800)
Neutrophils Relative %: 84 %
Platelets: 464 10*3/uL — ABNORMAL HIGH (ref 140–400)
RBC: 4.49 10*6/uL (ref 3.80–5.10)
RDW: 15.1 % — ABNORMAL HIGH (ref 11.0–15.0)
Total Lymphocyte: 12.7 %
WBC: 10.9 10*3/uL — ABNORMAL HIGH (ref 3.8–10.8)

## 2023-08-24 LAB — BASIC METABOLIC PANEL WITH GFR
BUN/Creatinine Ratio: 27 (calc) — ABNORMAL HIGH (ref 6–22)
BUN: 31 mg/dL — ABNORMAL HIGH (ref 7–25)
CO2: 30 mmol/L (ref 20–32)
Calcium: 10.5 mg/dL — ABNORMAL HIGH (ref 8.6–10.4)
Chloride: 98 mmol/L (ref 98–110)
Creat: 1.16 mg/dL — ABNORMAL HIGH (ref 0.60–1.00)
Glucose, Bld: 175 mg/dL — ABNORMAL HIGH (ref 65–99)
Potassium: 4.5 mmol/L (ref 3.5–5.3)
Sodium: 141 mmol/L (ref 135–146)
eGFR: 49 mL/min/{1.73_m2} — ABNORMAL LOW (ref >=60)

## 2023-08-24 LAB — C-REACTIVE PROTEIN: CRP: 7.3 mg/L (ref <8.0)

## 2023-08-24 LAB — SEDIMENTATION RATE: Sed Rate: 39 mm/h — ABNORMAL HIGH (ref 0–30)

## 2023-08-24 NOTE — Telephone Encounter (Signed)
-----   Message from [place] sent at 08/24/2023 11:26 AM EDT ----- Regarding: FW: Her esr is up a little bit but I think she can still just follow up w [place] prn it pain in her back comes back ----- Message ----- From: Janace Hoard Lab Results In Sent: 08/23/2023   3:20 PM EDT To: Randall Hiss, MD

## 2023-08-25 ENCOUNTER — Ambulatory Visit: Payer: Self-pay | Admitting: Infectious Disease

## 2023-08-25 DIAGNOSIS — I10 Essential (primary) hypertension: Secondary | ICD-10-CM | POA: Diagnosis not present

## 2023-08-25 DIAGNOSIS — E1142 Type 2 diabetes mellitus with diabetic polyneuropathy: Secondary | ICD-10-CM | POA: Diagnosis not present

## 2023-08-25 DIAGNOSIS — E782 Mixed hyperlipidemia: Secondary | ICD-10-CM | POA: Diagnosis not present

## 2023-08-25 DIAGNOSIS — N182 Chronic kidney disease, stage 2 (mild): Secondary | ICD-10-CM | POA: Diagnosis not present

## 2023-08-25 DIAGNOSIS — T783XXS Angioneurotic edema, sequela: Secondary | ICD-10-CM | POA: Diagnosis not present

## 2023-08-25 NOTE — Telephone Encounter (Signed)
Spoke with [name], relayed per Dr. Daiva Eves that ESR is a little elevated, but that he's okay with her following up as needed. Asked her to please let us know if her back pain returns.   Patient verbalized understanding and has no further questions.   Sandie Ano, RN

## 2023-08-30 DIAGNOSIS — M7061 Trochanteric bursitis, right hip: Secondary | ICD-10-CM | POA: Diagnosis not present

## 2023-08-30 DIAGNOSIS — Z96641 Presence of right artificial hip joint: Secondary | ICD-10-CM | POA: Diagnosis not present

## 2023-08-30 DIAGNOSIS — M25651 Stiffness of right hip, not elsewhere classified: Secondary | ICD-10-CM | POA: Diagnosis not present

## 2023-09-07 DIAGNOSIS — M7061 Trochanteric bursitis, right hip: Secondary | ICD-10-CM | POA: Diagnosis not present

## 2023-09-07 DIAGNOSIS — Z96641 Presence of right artificial hip joint: Secondary | ICD-10-CM | POA: Diagnosis not present

## 2023-09-07 DIAGNOSIS — M25651 Stiffness of right hip, not elsewhere classified: Secondary | ICD-10-CM | POA: Diagnosis not present

## 2023-09-10 DIAGNOSIS — M7061 Trochanteric bursitis, right hip: Secondary | ICD-10-CM | POA: Diagnosis not present

## 2023-09-10 DIAGNOSIS — Z96641 Presence of right artificial hip joint: Secondary | ICD-10-CM | POA: Diagnosis not present

## 2023-09-10 DIAGNOSIS — M25651 Stiffness of right hip, not elsewhere classified: Secondary | ICD-10-CM | POA: Diagnosis not present

## 2023-09-14 DIAGNOSIS — M7061 Trochanteric bursitis, right hip: Secondary | ICD-10-CM | POA: Diagnosis not present

## 2023-09-14 DIAGNOSIS — Z96641 Presence of right artificial hip joint: Secondary | ICD-10-CM | POA: Diagnosis not present

## 2023-09-14 DIAGNOSIS — M25651 Stiffness of right hip, not elsewhere classified: Secondary | ICD-10-CM | POA: Diagnosis not present

## 2023-09-16 DIAGNOSIS — M25651 Stiffness of right hip, not elsewhere classified: Secondary | ICD-10-CM | POA: Diagnosis not present

## 2023-09-16 DIAGNOSIS — R062 Wheezing: Secondary | ICD-10-CM | POA: Diagnosis not present

## 2023-09-16 DIAGNOSIS — Z96641 Presence of right artificial hip joint: Secondary | ICD-10-CM | POA: Diagnosis not present

## 2023-09-16 DIAGNOSIS — M7061 Trochanteric bursitis, right hip: Secondary | ICD-10-CM | POA: Diagnosis not present

## 2023-09-16 DIAGNOSIS — R0602 Shortness of breath: Secondary | ICD-10-CM | POA: Diagnosis not present

## 2023-09-16 DIAGNOSIS — J441 Chronic obstructive pulmonary disease with (acute) exacerbation: Secondary | ICD-10-CM | POA: Diagnosis not present

## 2023-09-20 DIAGNOSIS — Z96641 Presence of right artificial hip joint: Secondary | ICD-10-CM | POA: Diagnosis not present

## 2023-09-20 DIAGNOSIS — M25651 Stiffness of right hip, not elsewhere classified: Secondary | ICD-10-CM | POA: Diagnosis not present

## 2023-09-20 DIAGNOSIS — M7061 Trochanteric bursitis, right hip: Secondary | ICD-10-CM | POA: Diagnosis not present

## 2023-09-21 DIAGNOSIS — E782 Mixed hyperlipidemia: Secondary | ICD-10-CM | POA: Diagnosis not present

## 2023-09-21 DIAGNOSIS — M1990 Unspecified osteoarthritis, unspecified site: Secondary | ICD-10-CM | POA: Diagnosis not present

## 2023-09-21 DIAGNOSIS — M0609 Rheumatoid arthritis without rheumatoid factor, multiple sites: Secondary | ICD-10-CM | POA: Diagnosis not present

## 2023-09-21 DIAGNOSIS — M79644 Pain in right finger(s): Secondary | ICD-10-CM | POA: Diagnosis not present

## 2023-09-21 DIAGNOSIS — M4620 Osteomyelitis of vertebra, site unspecified: Secondary | ICD-10-CM | POA: Diagnosis not present

## 2023-09-21 DIAGNOSIS — E1122 Type 2 diabetes mellitus with diabetic chronic kidney disease: Secondary | ICD-10-CM | POA: Diagnosis not present

## 2023-09-21 DIAGNOSIS — Z79899 Other long term (current) drug therapy: Secondary | ICD-10-CM | POA: Diagnosis not present

## 2023-09-21 DIAGNOSIS — I10 Essential (primary) hypertension: Secondary | ICD-10-CM | POA: Diagnosis not present

## 2023-09-22 DIAGNOSIS — Z96641 Presence of right artificial hip joint: Secondary | ICD-10-CM | POA: Diagnosis not present

## 2023-09-22 DIAGNOSIS — M7061 Trochanteric bursitis, right hip: Secondary | ICD-10-CM | POA: Diagnosis not present

## 2023-09-22 DIAGNOSIS — M25651 Stiffness of right hip, not elsewhere classified: Secondary | ICD-10-CM | POA: Diagnosis not present

## 2023-09-29 DIAGNOSIS — E1142 Type 2 diabetes mellitus with diabetic polyneuropathy: Secondary | ICD-10-CM | POA: Diagnosis not present

## 2023-09-29 DIAGNOSIS — E782 Mixed hyperlipidemia: Secondary | ICD-10-CM | POA: Diagnosis not present

## 2023-09-29 DIAGNOSIS — I700 Atherosclerosis of aorta: Secondary | ICD-10-CM | POA: Diagnosis not present

## 2023-09-29 DIAGNOSIS — M0609 Rheumatoid arthritis without rheumatoid factor, multiple sites: Secondary | ICD-10-CM | POA: Diagnosis not present

## 2023-09-29 DIAGNOSIS — J432 Centrilobular emphysema: Secondary | ICD-10-CM | POA: Diagnosis not present

## 2023-09-29 DIAGNOSIS — G5621 Lesion of ulnar nerve, right upper limb: Secondary | ICD-10-CM | POA: Diagnosis not present

## 2023-09-29 DIAGNOSIS — N182 Chronic kidney disease, stage 2 (mild): Secondary | ICD-10-CM | POA: Diagnosis not present

## 2023-09-29 DIAGNOSIS — I10 Essential (primary) hypertension: Secondary | ICD-10-CM | POA: Diagnosis not present

## 2023-09-30 DIAGNOSIS — M25651 Stiffness of right hip, not elsewhere classified: Secondary | ICD-10-CM | POA: Diagnosis not present

## 2023-09-30 DIAGNOSIS — Z96641 Presence of right artificial hip joint: Secondary | ICD-10-CM | POA: Diagnosis not present

## 2023-09-30 DIAGNOSIS — M7061 Trochanteric bursitis, right hip: Secondary | ICD-10-CM | POA: Diagnosis not present

## 2023-10-06 ENCOUNTER — Encounter: Payer: Self-pay | Admitting: Podiatry

## 2023-10-06 ENCOUNTER — Ambulatory Visit: Payer: PPO | Admitting: Podiatry

## 2023-10-06 DIAGNOSIS — M79674 Pain in right toe(s): Secondary | ICD-10-CM | POA: Diagnosis not present

## 2023-10-06 DIAGNOSIS — L84 Corns and callosities: Secondary | ICD-10-CM | POA: Diagnosis not present

## 2023-10-06 DIAGNOSIS — Q828 Other specified congenital malformations of skin: Secondary | ICD-10-CM | POA: Diagnosis not present

## 2023-10-06 DIAGNOSIS — E119 Type 2 diabetes mellitus without complications: Secondary | ICD-10-CM | POA: Diagnosis not present

## 2023-10-06 DIAGNOSIS — B351 Tinea unguium: Secondary | ICD-10-CM

## 2023-10-06 DIAGNOSIS — M79675 Pain in left toe(s): Secondary | ICD-10-CM

## 2023-10-10 NOTE — Progress Notes (Signed)
ANNUAL DIABETIC FOOT EXAM  Subjective: [name] presents today for annual diabetic foot exam. Chief Complaint  Patient presents with   [name]    [name] BS not taken this am A1c 5.9 06/2023 PCP Appt 11/2023 patient has no concerns at this time.   Patient confirms h/o diabetes.  Patient denies any h/o foot wounds.  Anne Fick, MD is patient's PCP.  Past Medical History:  Diagnosis Date   Anxiety    Arthritis    "knees, ankles, fingers" (07/06/2018)   Asthma    Chronic lower back pain    Dyspnea    with asthma attacks    Dysrhythmia    Family history of adverse reaction to anesthesia    my first cousin had difficulty waking up    Fibroid    ovarian   GERD (gastroesophageal reflux disease)    Hip osteoarthritis    Left   History of staph infection    in hospital for 11 days/ in 2007   Hyperlipidemia    Hypertension    MSSA bacteremia 05/20/2023   Onychomycosis 01/20/2023   Radiculopathy    Tremor 03/30/2022   Type II diabetes mellitus (HCC)    Upper extremity weakness 03/30/2022   Wears glasses    Patient Active Problem List   Diagnosis Date Noted   MSSA bacteremia 05/20/2023   Onychomycosis 01/20/2023   H/O total shoulder replacement, right 08/03/2022   Failed total hip arthroplasty (HCC) 07/31/2022   Deformity of foot 06/02/2022   Diabetic renal disease (HCC) 06/02/2022   Gastroesophageal reflux disease 06/02/2022   Hardening of the aorta (main artery of the heart) (HCC) 06/02/2022   Iron deficiency anemia 06/02/2022   Major depressive disorder, single episode, unspecified 06/02/2022   Neuropathy 06/02/2022   Obesity 06/02/2022   Osteoarthritis 06/02/2022   Osteopenia 06/02/2022   Polyneuropathy due to type 2 diabetes mellitus (HCC) 06/02/2022   Tobacco user 06/02/2022   Osteomyelitis of thoracic region (HCC) 04/13/2022   Upper extremity weakness 03/30/2022   Tremor 03/30/2022   PICC (peripherally inserted central catheter) in place 02/20/2022    Vertebral osteomyelitis (HCC) 02/20/2022   Medication monitoring encounter    Epidural abscess 01/28/2022   Acute urinary retention 01/28/2022   Hypokalemia 01/28/2022   Rheumatoid arthritis (HCC) 01/27/2022   Hyperlipidemia 01/27/2022   Immunocompromised (HCC)    Sepsis (HCC) discitis/osteomyelitis, epidural phlegmon 01/26/2022   Discitis 01/26/2022   Centrilobular emphysema (HCC) 11/04/2021   Chronic kidney disease, stage 2 (mild) 11/04/2021   Acute exacerbation of chronic obstructive airways disease (HCC) 11/04/2021   Primary osteoarthritis of left hip 07/06/2018   Osteoarthritis of left hip 05/18/2018   Osteoarthritis of right hip 05/17/2018   Lumbar stenosis with neurogenic claudication 01/31/2018   HTN (hypertension) 09/03/2016   Diabetes (HCC) 09/03/2016   Radiculopathy 12/11/2015   Past Surgical History:  Procedure Laterality Date   ANTERIOR CERVICAL DECOMP/DISCECTOMY FUSION N/A 12/11/2015   Procedure: ANTERIOR CERVICAL DECOMPRESSION/DISCECTOMY FUSION 2 LEVELS;  Surgeon: Estill Bamberg, MD;  Location: [facility] OR;  Service: Orthopedics;  Laterality: N/A;  Anterior cervical decompression fusion, cervical 6-7, cervical 7-thoracic 1 with instrumentation and allograft   BACK SURGERY     CARDIAC CATHETERIZATION  1998   CATARACT EXTRACTION W/ INTRAOCULAR LENS  IMPLANT, BILATERAL Bilateral 2015   Bil   COLONOSCOPY     CRYOABLATION  1988   "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"   INCISION AND DRAINAGE Bilateral 2007>   "cleaned staph out of shoulders"  IR FLUORO GUIDED NEEDLE PLC ASPIRATION/INJECTION LOC  01/27/2022   IR FLUORO GUIDED NEEDLE PLC ASPIRATION/INJECTION LOC  01/27/2022   JOINT REPLACEMENT     LAPAROSCOPIC OVARIAN CYSTECTOMY  1970   LUMBAR LAMINECTOMY/DECOMPRESSION MICRODISCECTOMY N/A 01/31/2018   Procedure: LAMINECTOMY AND FORAMINOTOMY LUMBAR TWO- LUMBAR THREE, LUMBAR THREE- LUMBAR FOUR, LUMBAR FOUR- LUMBAR FIVE ;  Surgeon: Tressie Stalker, MD;  Location:  [facility];  Service: Neurosurgery;  Laterality: N/A;   LUMBAR LAMINECTOMY/DECOMPRESSION MICRODISCECTOMY N/A 04/13/2022   Procedure: Thoracic seven,Thoracic eight, Thoracic nine, Thoracic ten LAMINECTOMY;  Surgeon: Tressie Stalker, MD;  Location: [facility] OR;  Service: Neurosurgery;  Laterality: N/A;   POSTERIOR CERVICAL LAMINECTOMY  ~ 1999   SHOULDER OPEN ROTATOR CUFF REPAIR Right 02/2003   THUMB FUSION Right 2010   right thumb   TONSILLECTOMY     TOTAL ABDOMINAL HYSTERECTOMY  1998   TAH,BSO   TOTAL HIP ARTHROPLASTY Right 05/18/2018   Procedure: TOTAL HIP ARTHROPLASTY ANTERIOR APPROACH;  Surgeon: Gean Birchwood, MD;  Location: [facility] OR;  Service: Orthopedics;  Laterality: Right;   TOTAL HIP ARTHROPLASTY Left 07/06/2018   TOTAL HIP ARTHROPLASTY Left 07/06/2018   Procedure: LEFT TOTAL HIP ARTHROPLASTY ANTERIOR APPROACH;  Surgeon: Gean Birchwood, MD;  Location: [facility] OR;  Service: Orthopedics;  Laterality: Left;  Needs RNFA   TOTAL HIP REVISION Right 08/03/2022   Procedure: TOTAL HIP REVISION;  Surgeon: Gean Birchwood, MD;  Location: [facility] ORS;  Service: Orthopedics;  Laterality: Right;   TUBAL LIGATION     Current Outpatient Medications on File Prior to Visit  Medication Sig Dispense Refill   albuterol (ACCUNEB) 1.25 MG/3ML nebulizer solution Take 1 ampule by nebulization every 4 (four) hours as needed for wheezing.     albuterol (VENTOLIN HFA) 108 (90 Base) MCG/ACT inhaler Inhale 2 puffs into the lungs every 6 (six) hours as needed for wheezing or shortness of breath.     amoxicillin-clavulanate (AUGMENTIN) 875-125 MG tablet Take 1 tablet by mouth 2 (two) times daily. 28 tablet 1   aspirin EC 81 MG tablet Take 1 tablet (81 mg total) by mouth 2 (two) times daily. 60 tablet 0   Blood Glucose Monitoring Suppl (ONE TOUCH ULTRA 2) w/Device KIT Check blood sugar twice per week     bumetanide (BUMEX) 1 MG tablet Take 1 mg by mouth 2 (two) times daily.     cetirizine (ZYRTEC) 10 MG tablet Take 10 mg by mouth daily.      cholecalciferol (VITAMIN D3) 25 MCG (1000 UNIT) tablet Take 1,000 Units by mouth daily.     diclofenac (VOLTAREN) 75 MG EC tablet Take 75 mg by mouth 2 (two) times daily.     diclofenac Sodium (VOLTAREN) 1 % GEL Apply 4 g topically 4 (four) times daily. (Patient taking differently: Apply 4 g topically daily as needed (pain).) 100 g 0   diltiazem (CARDIZEM CD) 180 MG 24 hr capsule Take 180 mg by mouth daily.     folic acid (FOLVITE) 800 MCG tablet Take 800 mcg by mouth daily.     gabapentin (NEURONTIN) 300 MG capsule Take 300 mg by mouth at bedtime.     glucose blood test strip      hydrochlorothiazide (HYDRODIURIL) 12.5 MG tablet Take 1 tablet (12.5 mg total) by mouth daily. 30 tablet 0   hydrOXYzine (ATARAX) 10 MG tablet Take 10-20 mg by mouth 2 (two) times daily as needed for anxiety.     Lancets (ONETOUCH DELICA PLUS LANCET33G) MISC Use to check blood sugar  lovastatin (MEVACOR) 40 MG tablet Take 40 mg by mouth at bedtime.      meclizine (ANTIVERT) 12.5 MG tablet Take 12.5 mg by mouth 2 (two) times daily as needed for dizziness.     metFORMIN (GLUCOPHAGE-XR) 500 MG 24 hr tablet Take 500 mg by mouth in the morning and at bedtime.     methotrexate (RHEUMATREX) 2.5 MG tablet TAKE 6 TABLETS BY MOUTH 1 TIME A WEEK Orally once a week for 90 days     Multiple Vitamin (MULTIVITAMIN WITH MINERALS) TABS tablet Take 1 tablet by mouth daily.     omeprazole (PRILOSEC OTC) 20 MG tablet Take 20 mg by mouth daily as needed (heartburn).     ONETOUCH ULTRA test strip      sodium chloride (OCEAN) 0.65 % SOLN nasal spray Place 1-2 sprays into the nose 4 (four) times daily as needed for congestion.     terbinafine (LAMISIL) 250 MG tablet Take 1 tablet (250 mg total) by mouth daily. 30 tablet 2   TRELEGY ELLIPTA 200-62.5-25 MCG/ACT AEPB Take 1 puff by mouth daily.     No current facility-administered medications on file prior to visit.    Allergies  Allergen Reactions   Enalapril Maleate Swelling   Ivp Dye  [Iodinated Contrast Media] Nausea And Vomiting   Prednisone Other (See Comments)    reflux   Social History   Occupational History   Not on file  Tobacco Use   Smoking status: Former    Current packs/day: 0.00    Average packs/day: 1 pack/day for 25.0 years (25.0 ttl pk-yrs)    Types: Cigarettes    Start date: 11/24/1967    Quit date: 11/23/1992    Years since quitting: 30.8   Smokeless tobacco: Never  Vaping Use   Vaping status: Never Used  Substance and Sexual Activity   Alcohol use: Not Currently   Drug use: Never   Sexual activity: Not Currently   Family History  Problem Relation Age of Onset   Diabetes Sister    Diabetes Brother    Diabetes Brother    Diabetes Paternal Uncle    Cancer Paternal Aunt        UTERINE   Hypertension Maternal Grandmother    Heart disease Maternal Grandmother    Colon cancer Neg Hx    Immunization History  Administered Date(s) Administered   Fluad Trivalent(High Dose 65+) 07/29/2023   Influenza, High Dose Seasonal PF 09/12/2015, 08/27/2016, 09/11/2022   Influenza, Quadrivalent, Recombinant, Inj, Pf 08/31/2017, 08/19/2018, 08/25/2019, 08/15/2020, 08/27/2021   Influenza-Unspecified 09/15/2011, 09/16/2012, 08/17/2013, 07/24/2014   PFIZER(Purple Top)SARS-COV-2 Vaccination 12/30/2019, 01/20/2020, 10/05/2020   PNEUMOCOCCAL CONJUGATE-20 02/12/2022   Pfizer(Comirnaty)Fall Seasonal Vaccine 12 years and older 09/28/2022   Pneumococcal Conjugate-13 09/19/2015   Pneumococcal Polysaccharide-23 08/17/2013, 06/05/2020   Tdap 08/01/2012   Zoster, Unspecified 09/07/2008     Review of Systems: Negative except as noted in the HPI.   Objective: There were no vitals filed for this visit.  HADEEL MENSINGER is a pleasant 77 y.o. female in NAD. AAO X 3.  Title   Diabetic Foot Exam - detailed Date & Time: 10/05/2023  9:30 AM Diabetic Foot exam was performed with the following findings: Yes  Visual Foot Exam completed.: Yes  Is there a history of foot  ulcer?: No Is there a foot ulcer now?: No Is there swelling?: No Is there elevated skin temperature?: No Is there abnormal foot shape?: No Is there a claw toe deformity?: Yes Are the toenails long?:  Yes Are the toenails thick?: No Are the toenails ingrown?: No Is the skin thin, fragile, shiny and hairless?": Yes Normal Range of Motion?: No Is there foot or ankle muscle weakness?: No Do you have pain in calf while walking?: No Are the shoes appropriate in style and fit?: Yes Can the patient see the bottom of their feet?: No Pulse Foot Exam completed.: Yes   Right Posterior Tibialis: Present Left posterior Tibialis: Present   Right Dorsalis Pedis: Present Left Dorsalis Pedis: Present     Sensory Foot Exam Completed.: Yes Semmes-Weinstein Monofilament Test "+" means "has sensation" and "-" means "no sensation"  R Foot Test Control: Pos L Foot Test Control: Pos   R Site 1-Great Toe: Pos L Site 1-Great Toe: Pos   R Site 4: Pos L Site 4: Pos   R site 5: Pos L Site 5: Pos  R Site 6: Pos L Site 6: Pos     Image components are not supported.   Image components are not supported. Image components are not supported.  Tuning Fork Right vibratory: present Left vibratory: present  Comments     Lab Results  Component Value Date   HGBA1C 5.8 (H) 07/23/2022   ADA Risk Categorization: Low Risk :  Patient has all of the following: Intact protective sensation No prior foot ulcer  No severe deformity Pedal pulses present  Assessment: 1. Pain due to onychomycosis of toenails of both feet   2. Callus   3. Porokeratosis   4. Controlled type 2 diabetes mellitus without complication, without long-term current use of insulin (HCC)   5. Encounter for diabetic foot exam (HCC)     Plan: -Consent given for treatment as described below: -Examined patient. -Diabetic foot examination performed today. -Continue diabetic foot care principles: inspect feet daily, monitor glucose as  recommended by PCP and/or Endocrinologist, and follow prescribed diet per PCP, Endocrinologist and/or dietician. -Patient to continue soft, supportive shoe gear daily. -Toenails were debrided in length and girth 2-5 bilaterally and left great toe with sterile nail nippers and dremel without iatrogenic bleeding.  -Callus(es) left great toe pared utilizing sterile scalpel blade without complication or incident. Total number debrided =1. -Porokeratotic lesion(s) left fifth digit pared and enucleated with sterile currette without incident. Total number of lesions debrided=1. -Patient/POA to call should there be question/concern in the interim. Return in about 3 months (around 01/06/2024).  Freddie Breech, DPM      [facility] LOCATION: [address] [place] [address]                   Office [phone]   [facility] LOCATION: [address] [place], [place] [address] Office [phone]

## 2023-10-27 DIAGNOSIS — Z13228 Encounter for screening for other metabolic disorders: Secondary | ICD-10-CM | POA: Diagnosis not present

## 2023-11-10 DIAGNOSIS — Z79899 Other long term (current) drug therapy: Secondary | ICD-10-CM | POA: Diagnosis not present

## 2023-11-15 DIAGNOSIS — T148XXA Other injury of unspecified body region, initial encounter: Secondary | ICD-10-CM | POA: Diagnosis not present

## 2023-11-27 ENCOUNTER — Ambulatory Visit (INDEPENDENT_AMBULATORY_CARE_PROVIDER_SITE_OTHER): Payer: PPO

## 2023-11-27 ENCOUNTER — Ambulatory Visit
Admission: EM | Admit: 2023-11-27 | Discharge: 2023-11-27 | Disposition: A | Discharge status: Home / Self Care | Payer: PPO | Attending: Family Medicine | Admitting: Family Medicine

## 2023-11-27 DIAGNOSIS — M25512 Pain in left shoulder: Secondary | ICD-10-CM | POA: Diagnosis not present

## 2023-11-27 DIAGNOSIS — M19012 Primary osteoarthritis, left shoulder: Secondary | ICD-10-CM | POA: Diagnosis not present

## 2023-11-27 MED ORDER — METHOCARBAMOL 500 MG PO TABS: 500.0000 mg | ORAL_TABLET | Freq: Two times a day (BID) | ORAL | 0 refills | Status: AC

## 2023-11-27 MED ORDER — ACETAMINOPHEN 325 MG PO TABS
650.0000 mg | ORAL_TABLET | Freq: Four times a day (QID) | ORAL | 0 refills | Status: AC | PRN
Start: 2023-11-27 — End: ?
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
## 2023-12-09 DIAGNOSIS — M19012 Primary osteoarthritis, left shoulder: Secondary | ICD-10-CM | POA: Diagnosis not present

## 2023-12-09 DIAGNOSIS — M67912 Unspecified disorder of synovium and tendon, left shoulder: Secondary | ICD-10-CM | POA: Diagnosis not present

## 2023-12-09 DIAGNOSIS — S43432A Superior glenoid labrum lesion of left shoulder, initial encounter: Secondary | ICD-10-CM | POA: Diagnosis not present

## 2023-12-15 DIAGNOSIS — M0609 Rheumatoid arthritis without rheumatoid factor, multiple sites: Secondary | ICD-10-CM | POA: Diagnosis not present

## 2023-12-15 DIAGNOSIS — J432 Centrilobular emphysema: Secondary | ICD-10-CM | POA: Diagnosis not present

## 2023-12-15 DIAGNOSIS — E1142 Type 2 diabetes mellitus with diabetic polyneuropathy: Secondary | ICD-10-CM | POA: Diagnosis not present

## 2023-12-15 DIAGNOSIS — I10 Essential (primary) hypertension: Secondary | ICD-10-CM | POA: Diagnosis not present

## 2023-12-15 DIAGNOSIS — E782 Mixed hyperlipidemia: Secondary | ICD-10-CM | POA: Diagnosis not present

## 2023-12-15 DIAGNOSIS — I700 Atherosclerosis of aorta: Secondary | ICD-10-CM | POA: Diagnosis not present

## 2023-12-15 DIAGNOSIS — N182 Chronic kidney disease, stage 2 (mild): Secondary | ICD-10-CM | POA: Diagnosis not present

## 2023-12-20 DIAGNOSIS — H04123 Dry eye syndrome of bilateral lacrimal glands: Secondary | ICD-10-CM | POA: Diagnosis not present

## 2023-12-20 DIAGNOSIS — H524 Presbyopia: Secondary | ICD-10-CM | POA: Diagnosis not present

## 2023-12-20 DIAGNOSIS — H40013 Open angle with borderline findings, low risk, bilateral: Secondary | ICD-10-CM | POA: Diagnosis not present

## 2023-12-20 DIAGNOSIS — H35371 Puckering of macula, right eye: Secondary | ICD-10-CM | POA: Diagnosis not present

## 2023-12-20 DIAGNOSIS — E119 Type 2 diabetes mellitus without complications: Secondary | ICD-10-CM | POA: Diagnosis not present

## 2023-12-22 DIAGNOSIS — N182 Chronic kidney disease, stage 2 (mild): Secondary | ICD-10-CM | POA: Diagnosis not present

## 2023-12-22 DIAGNOSIS — I700 Atherosclerosis of aorta: Secondary | ICD-10-CM | POA: Diagnosis not present

## 2023-12-22 DIAGNOSIS — I10 Essential (primary) hypertension: Secondary | ICD-10-CM | POA: Diagnosis not present

## 2023-12-22 DIAGNOSIS — M0609 Rheumatoid arthritis without rheumatoid factor, multiple sites: Secondary | ICD-10-CM | POA: Diagnosis not present

## 2023-12-22 DIAGNOSIS — E1142 Type 2 diabetes mellitus with diabetic polyneuropathy: Secondary | ICD-10-CM | POA: Diagnosis not present

## 2023-12-22 DIAGNOSIS — J432 Centrilobular emphysema: Secondary | ICD-10-CM | POA: Diagnosis not present

## 2023-12-22 DIAGNOSIS — E782 Mixed hyperlipidemia: Secondary | ICD-10-CM | POA: Diagnosis not present

## 2023-12-27 DIAGNOSIS — M19012 Primary osteoarthritis, left shoulder: Secondary | ICD-10-CM | POA: Diagnosis not present

## 2024-01-04 DIAGNOSIS — Z1231 Encounter for screening mammogram for malignant neoplasm of breast: Secondary | ICD-10-CM | POA: Diagnosis not present

## 2024-01-10 DIAGNOSIS — H34212 Partial retinal artery occlusion, left eye: Secondary | ICD-10-CM | POA: Diagnosis not present

## 2024-01-10 DIAGNOSIS — E1142 Type 2 diabetes mellitus with diabetic polyneuropathy: Secondary | ICD-10-CM | POA: Diagnosis not present

## 2024-01-10 DIAGNOSIS — K2100 Gastro-esophageal reflux disease with esophagitis, without bleeding: Secondary | ICD-10-CM | POA: Diagnosis not present

## 2024-01-10 DIAGNOSIS — H539 Unspecified visual disturbance: Secondary | ICD-10-CM | POA: Diagnosis not present

## 2024-01-11 ENCOUNTER — Ambulatory Visit (HOSPITAL_COMMUNITY)
Admission: RE | Admit: 2024-01-11 | Discharge: 2024-01-11 | Disposition: A | Discharge status: Home / Self Care | Payer: PPO | Source: Ambulatory Visit | Attending: Internal Medicine | Admitting: Internal Medicine

## 2024-01-11 ENCOUNTER — Other Ambulatory Visit (HOSPITAL_COMMUNITY): Payer: Self-pay | Admitting: Internal Medicine

## 2024-01-11 DIAGNOSIS — H539 Unspecified visual disturbance: Secondary | ICD-10-CM | POA: Insufficient documentation

## 2024-01-11 DIAGNOSIS — R930 Abnormal findings on diagnostic imaging of skull and head, not elsewhere classified: Secondary | ICD-10-CM | POA: Insufficient documentation

## 2024-01-11 DIAGNOSIS — J328 Other chronic sinusitis: Secondary | ICD-10-CM | POA: Insufficient documentation

## 2024-01-12 ENCOUNTER — Ambulatory Visit: Payer: PPO | Admitting: Podiatry

## 2024-01-12 DIAGNOSIS — R002 Palpitations: Secondary | ICD-10-CM | POA: Diagnosis not present

## 2024-01-12 DIAGNOSIS — H539 Unspecified visual disturbance: Secondary | ICD-10-CM | POA: Diagnosis not present

## 2024-01-12 DIAGNOSIS — R42 Dizziness and giddiness: Secondary | ICD-10-CM | POA: Diagnosis not present

## 2024-01-17 DIAGNOSIS — M1990 Unspecified osteoarthritis, unspecified site: Secondary | ICD-10-CM | POA: Diagnosis not present

## 2024-01-17 DIAGNOSIS — I10 Essential (primary) hypertension: Secondary | ICD-10-CM | POA: Diagnosis not present

## 2024-01-17 DIAGNOSIS — M4620 Osteomyelitis of vertebra, site unspecified: Secondary | ICD-10-CM | POA: Diagnosis not present

## 2024-01-17 DIAGNOSIS — M0609 Rheumatoid arthritis without rheumatoid factor, multiple sites: Secondary | ICD-10-CM | POA: Diagnosis not present

## 2024-01-17 DIAGNOSIS — Z79899 Other long term (current) drug therapy: Secondary | ICD-10-CM | POA: Diagnosis not present

## 2024-01-17 DIAGNOSIS — E1122 Type 2 diabetes mellitus with diabetic chronic kidney disease: Secondary | ICD-10-CM | POA: Diagnosis not present

## 2024-01-17 DIAGNOSIS — E782 Mixed hyperlipidemia: Secondary | ICD-10-CM | POA: Diagnosis not present

## 2024-01-20 DIAGNOSIS — I10 Essential (primary) hypertension: Secondary | ICD-10-CM | POA: Diagnosis not present

## 2024-01-20 DIAGNOSIS — E782 Mixed hyperlipidemia: Secondary | ICD-10-CM | POA: Diagnosis not present

## 2024-01-20 DIAGNOSIS — H34212 Partial retinal artery occlusion, left eye: Secondary | ICD-10-CM | POA: Diagnosis not present

## 2024-01-20 DIAGNOSIS — N182 Chronic kidney disease, stage 2 (mild): Secondary | ICD-10-CM | POA: Diagnosis not present

## 2024-01-20 DIAGNOSIS — E1122 Type 2 diabetes mellitus with diabetic chronic kidney disease: Secondary | ICD-10-CM | POA: Diagnosis not present

## 2024-01-20 DIAGNOSIS — H5709 Other anomalies of pupillary function: Secondary | ICD-10-CM | POA: Diagnosis not present

## 2024-01-20 DIAGNOSIS — H04123 Dry eye syndrome of bilateral lacrimal glands: Secondary | ICD-10-CM | POA: Diagnosis not present

## 2024-01-20 DIAGNOSIS — H40013 Open angle with borderline findings, low risk, bilateral: Secondary | ICD-10-CM | POA: Diagnosis not present

## 2024-01-20 DIAGNOSIS — J432 Centrilobular emphysema: Secondary | ICD-10-CM | POA: Diagnosis not present

## 2024-01-24 ENCOUNTER — Telehealth: Payer: Self-pay

## 2024-01-24 NOTE — Telephone Encounter (Signed)
 Called and spoke with [name], relayed per Dr. 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 She has questions about whether her sight will return and mentions she's coming off of prednisone and is now taking insulin. Encouraged her to continue follow up with her PCP and ophthalmologist for her additional concerns regarding prognosis and treatment.   Sandie Ano, RN

## 2024-01-24 NOTE — Telephone Encounter (Signed)
 Pt called and asked if a nurse could possibly advise or see if this is something that Dr. Daiva Eves would like to see her for. She requested if we could give her a call back.   She stated that she see Dr. Daiva Eves previously and she wanted to ensure this is not something that is related to the infection he treated before. 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   Confirmed pt's contact of [phone]

## 2024-01-27 DIAGNOSIS — E1122 Type 2 diabetes mellitus with diabetic chronic kidney disease: Secondary | ICD-10-CM | POA: Diagnosis not present

## 2024-01-27 DIAGNOSIS — H34232 Retinal artery branch occlusion, left eye: Secondary | ICD-10-CM | POA: Diagnosis not present

## 2024-01-27 DIAGNOSIS — E782 Mixed hyperlipidemia: Secondary | ICD-10-CM | POA: Diagnosis not present

## 2024-02-02 DIAGNOSIS — R002 Palpitations: Secondary | ICD-10-CM | POA: Diagnosis not present

## 2024-02-03 DIAGNOSIS — E1142 Type 2 diabetes mellitus with diabetic polyneuropathy: Secondary | ICD-10-CM | POA: Diagnosis not present

## 2024-02-03 DIAGNOSIS — J432 Centrilobular emphysema: Secondary | ICD-10-CM | POA: Diagnosis not present

## 2024-02-03 DIAGNOSIS — R5383 Other fatigue: Secondary | ICD-10-CM | POA: Diagnosis not present

## 2024-02-03 DIAGNOSIS — N182 Chronic kidney disease, stage 2 (mild): Secondary | ICD-10-CM | POA: Diagnosis not present

## 2024-02-03 DIAGNOSIS — I10 Essential (primary) hypertension: Secondary | ICD-10-CM | POA: Diagnosis not present

## 2024-02-08 ENCOUNTER — Encounter: Payer: Self-pay | Admitting: Podiatry

## 2024-02-08 ENCOUNTER — Ambulatory Visit: Payer: PPO | Admitting: Podiatry

## 2024-02-08 VITALS — Ht <= 58 in | Wt 158.0 lb

## 2024-02-08 DIAGNOSIS — M79675 Pain in left toe(s): Secondary | ICD-10-CM | POA: Diagnosis not present

## 2024-02-08 DIAGNOSIS — M79674 Pain in right toe(s): Secondary | ICD-10-CM | POA: Diagnosis not present

## 2024-02-08 DIAGNOSIS — B351 Tinea unguium: Secondary | ICD-10-CM | POA: Diagnosis not present

## 2024-02-08 DIAGNOSIS — Q828 Other specified congenital malformations of skin: Secondary | ICD-10-CM

## 2024-02-08 DIAGNOSIS — E119 Type 2 diabetes mellitus without complications: Secondary | ICD-10-CM

## 2024-02-13 NOTE — Progress Notes (Signed)
  Subjective:  Patient ID: [name], female    DOB: [birth year],  MRN: [id number]  [name] presents to clinic today for preventative diabetic foot care and corn(s) left foot and painful mycotic toenails that are difficult to trim. Painful toenails interfere with ambulation. Aggravating factors include wearing enclosed shoe gear. Pain is relieved with periodic professional debridement. Painful corns are aggravated when weightbearing when wearing enclosed shoe gear. Pain is relieved with periodic professional debridement.  Chief Complaint  Patient presents with   Nail Problem    Pt is here for [facility] unsure of last A1C PCP is Dr Nicholos Johns and LOV was 2 weeks ago.   New problem(s): None.   PCP is Georgianne Fick, MD.  Allergies  Allergen Reactions   Enalapril Maleate Swelling   Ivp Dye [Iodinated Contrast Media] Nausea And Vomiting   Prednisone Other (See Comments)    reflux    Review of Systems: Negative except as noted in the HPI.  Objective: No changes noted in today's physical examination. There were no vitals filed for this visit. SHILYNN Carpenter is a pleasant 78 y.o. female WD, WN in NAD. AAO x 3.  Vascular Examination: Capillary refill time immediate b/l. Vascular status intact b/l with palpable pedal pulses. Pedal hair present b/l. No pain with calf compression b/l. Skin temperature gradient WNL b/l. No cyanosis or clubbing b/l. No ischemia or gangrene noted b/l.   Neurological Examination: Sensation grossly intact b/l with 10 gram monofilament. Vibratory sensation intact b/l.   Dermatological Examination: Pedal skin with normal turgor, texture and tone b/l.  No open wounds. No interdigital macerations.   Toenails 1-5 b/l thick, discolored, elongated with subungual debris and pain on dorsal palpation.   Porokeratotic lesion(s) L 5th toe. No erythema, no edema, no drainage, no fluctuance.  Musculoskeletal Examination: Muscle strength 5/5 to all lower  extremity muscle groups bilaterally. 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  Radiographs: None  Last A1c:       No data to display         Assessment/Plan: 1. Pain due to onychomycosis of toenails of both feet   2. Porokeratosis   3. Controlled type 2 diabetes mellitus without complication, without long-term current use of insulin (HCC)   Patient was evaluated and treated. All patient's and/or POA's questions/concerns addressed on today's visit. Mycotic toenails 1-5 debrided in length and girth without incident. Porokeratotic lesion(s) L 5th toe pared with sharp debridement without incident. Continue soft, supportive shoe gear daily. Report any pedal injuries to medical professional. Call office if there are any questions/concerns. -Patient/POA to call should there be question/concern in the interim.   Return in about 3 months (around 05/10/2024).  Freddie Breech, DPM      [place] LOCATION: [address], [place] [address]                   Office [phone]   [facility] LOCATION: [address] [place], [place] [address] Office [phone]

## 2024-02-14 DIAGNOSIS — R002 Palpitations: Secondary | ICD-10-CM | POA: Diagnosis not present

## 2024-02-21 DIAGNOSIS — H34212 Partial retinal artery occlusion, left eye: Secondary | ICD-10-CM | POA: Diagnosis not present

## 2024-02-21 DIAGNOSIS — H5709 Other anomalies of pupillary function: Secondary | ICD-10-CM | POA: Diagnosis not present

## 2024-02-21 DIAGNOSIS — H04123 Dry eye syndrome of bilateral lacrimal glands: Secondary | ICD-10-CM | POA: Diagnosis not present

## 2024-02-22 DIAGNOSIS — G250 Essential tremor: Secondary | ICD-10-CM | POA: Diagnosis not present

## 2024-02-22 DIAGNOSIS — L659 Nonscarring hair loss, unspecified: Secondary | ICD-10-CM | POA: Diagnosis not present

## 2024-04-04 ENCOUNTER — Ambulatory Visit: Payer: PPO | Admitting: Podiatry

## 2024-04-11 ENCOUNTER — Encounter: Payer: Self-pay | Admitting: Podiatry

## 2024-04-11 ENCOUNTER — Ambulatory Visit (INDEPENDENT_AMBULATORY_CARE_PROVIDER_SITE_OTHER): Payer: PPO | Admitting: Podiatry

## 2024-04-11 DIAGNOSIS — M79674 Pain in right toe(s): Secondary | ICD-10-CM

## 2024-04-11 DIAGNOSIS — Q828 Other specified congenital malformations of skin: Secondary | ICD-10-CM | POA: Diagnosis not present

## 2024-04-11 DIAGNOSIS — B351 Tinea unguium: Secondary | ICD-10-CM

## 2024-04-11 DIAGNOSIS — E119 Type 2 diabetes mellitus without complications: Secondary | ICD-10-CM | POA: Diagnosis not present

## 2024-04-11 DIAGNOSIS — M79675 Pain in left toe(s): Secondary | ICD-10-CM | POA: Diagnosis not present

## 2024-04-11 NOTE — Progress Notes (Unsigned)
  Subjective:  Patient ID: [name], female    DOB: Jan 16, 1946,  MRN: [id number]  78 y.o. female presents {jgcomplaint:23593}  Chief Complaint  Patient presents with   RFC    Diabetes: yes Last A1c: 5.8 Last FSBS: 109 this AM Anticoagulant: none PCP Name & Last Visit: Dr. Aretta Beery 02/14/24    New problem(s): None {jgcomplaint:23593}  PCP is Virgle Grime, MD , and last visit was {Time; dates multiple:15870}.  Allergies  Allergen Reactions   Enalapril  Maleate Swelling   Ivp Dye [Iodinated Contrast Media] Nausea And Vomiting   Prednisone Other (See Comments)    reflux  Other Reaction(s): acid reflux    Review of Systems: Negative except as noted in the HPI.   Objective:  [name] is a pleasant 78 y.o. female {jgbodyhabitus:24098} AAO x 3.  Vascular Examination: Vascular status intact b/l with palpable pedal pulses. CFT immediate b/l. Pedal hair present. No edema. No pain with calf compression b/l. Skin temperature gradient WNL b/l. No varicosities noted. No cyanosis or clubbing noted.  Neurological Examination: Sensation grossly intact b/l with 10 gram monofilament. Vibratory sensation intact b/l.  Dermatological Examination: Pedal skin with normal turgor, texture and tone b/l.  No open wounds. No interdigital macerations.   Toenails 1-5 b/l thick, discolored, elongated with subungual debris and pain on dorsal palpation.   Porokeratotic lesion(s) L 5th toe. No erythema, no edema, no drainage, no fluctuance.  Musculoskeletal Examination: Muscle strength 5/5 to all lower extremity muscle groups bilaterally. 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  Radiographs: None  Last A1c:       No data to display          Assessment:   1. Pain due to onychomycosis of toenails of both feet   2. Porokeratosis   3. Controlled type 2 diabetes mellitus without complication, without long-term current use of insulin  (HCC)     Plan:   {jgplan:23602::"-Patient/POA to call should there be question/concern in the interim."}  Return in about 3 months (around 07/12/2024).  Luella Sager, DPM      [place] LOCATION: [address], [place] [address]                   Office [phone]   [facility] LOCATION: [address] [place], [place] [address] Office [phone]

## 2024-04-19 DIAGNOSIS — Z79899 Other long term (current) drug therapy: Secondary | ICD-10-CM | POA: Diagnosis not present

## 2024-04-19 DIAGNOSIS — E1142 Type 2 diabetes mellitus with diabetic polyneuropathy: Secondary | ICD-10-CM | POA: Diagnosis not present

## 2024-04-19 DIAGNOSIS — J432 Centrilobular emphysema: Secondary | ICD-10-CM | POA: Diagnosis not present

## 2024-04-19 DIAGNOSIS — I700 Atherosclerosis of aorta: Secondary | ICD-10-CM | POA: Diagnosis not present

## 2024-04-19 DIAGNOSIS — E1122 Type 2 diabetes mellitus with diabetic chronic kidney disease: Secondary | ICD-10-CM | POA: Diagnosis not present

## 2024-04-19 DIAGNOSIS — R5383 Other fatigue: Secondary | ICD-10-CM | POA: Diagnosis not present

## 2024-04-19 DIAGNOSIS — M4620 Osteomyelitis of vertebra, site unspecified: Secondary | ICD-10-CM | POA: Diagnosis not present

## 2024-04-19 DIAGNOSIS — M0609 Rheumatoid arthritis without rheumatoid factor, multiple sites: Secondary | ICD-10-CM | POA: Diagnosis not present

## 2024-04-19 DIAGNOSIS — I10 Essential (primary) hypertension: Secondary | ICD-10-CM | POA: Diagnosis not present

## 2024-04-19 DIAGNOSIS — M25569 Pain in unspecified knee: Secondary | ICD-10-CM | POA: Diagnosis not present

## 2024-04-19 DIAGNOSIS — E782 Mixed hyperlipidemia: Secondary | ICD-10-CM | POA: Diagnosis not present

## 2024-04-19 DIAGNOSIS — M1990 Unspecified osteoarthritis, unspecified site: Secondary | ICD-10-CM | POA: Diagnosis not present

## 2024-04-19 DIAGNOSIS — N182 Chronic kidney disease, stage 2 (mild): Secondary | ICD-10-CM | POA: Diagnosis not present

## 2024-04-25 DIAGNOSIS — M1711 Unilateral primary osteoarthritis, right knee: Secondary | ICD-10-CM | POA: Diagnosis not present

## 2024-04-26 DIAGNOSIS — M0609 Rheumatoid arthritis without rheumatoid factor, multiple sites: Secondary | ICD-10-CM | POA: Diagnosis not present

## 2024-04-26 DIAGNOSIS — Z0000 Encounter for general adult medical examination without abnormal findings: Secondary | ICD-10-CM | POA: Diagnosis not present

## 2024-04-26 DIAGNOSIS — E1142 Type 2 diabetes mellitus with diabetic polyneuropathy: Secondary | ICD-10-CM | POA: Diagnosis not present

## 2024-04-26 DIAGNOSIS — N182 Chronic kidney disease, stage 2 (mild): Secondary | ICD-10-CM | POA: Diagnosis not present

## 2024-04-26 DIAGNOSIS — L659 Nonscarring hair loss, unspecified: Secondary | ICD-10-CM | POA: Diagnosis not present

## 2024-04-26 DIAGNOSIS — E782 Mixed hyperlipidemia: Secondary | ICD-10-CM | POA: Diagnosis not present

## 2024-04-26 DIAGNOSIS — J432 Centrilobular emphysema: Secondary | ICD-10-CM | POA: Diagnosis not present

## 2024-04-26 DIAGNOSIS — I10 Essential (primary) hypertension: Secondary | ICD-10-CM | POA: Diagnosis not present

## 2024-04-26 DIAGNOSIS — I700 Atherosclerosis of aorta: Secondary | ICD-10-CM | POA: Diagnosis not present

## 2024-05-08 DIAGNOSIS — F411 Generalized anxiety disorder: Secondary | ICD-10-CM | POA: Diagnosis not present

## 2024-05-16 DIAGNOSIS — F411 Generalized anxiety disorder: Secondary | ICD-10-CM | POA: Diagnosis not present

## 2024-05-29 DIAGNOSIS — F411 Generalized anxiety disorder: Secondary | ICD-10-CM | POA: Diagnosis not present

## 2024-06-06 DIAGNOSIS — M170 Bilateral primary osteoarthritis of knee: Secondary | ICD-10-CM | POA: Diagnosis not present

## 2024-06-07 DIAGNOSIS — I10 Essential (primary) hypertension: Secondary | ICD-10-CM | POA: Diagnosis not present

## 2024-06-08 DIAGNOSIS — M19011 Primary osteoarthritis, right shoulder: Secondary | ICD-10-CM | POA: Diagnosis not present

## 2024-06-22 DIAGNOSIS — M19012 Primary osteoarthritis, left shoulder: Secondary | ICD-10-CM | POA: Diagnosis not present

## 2024-06-27 DIAGNOSIS — F411 Generalized anxiety disorder: Secondary | ICD-10-CM | POA: Diagnosis not present

## 2024-07-13 DIAGNOSIS — F411 Generalized anxiety disorder: Secondary | ICD-10-CM | POA: Diagnosis not present

## 2024-07-18 ENCOUNTER — Ambulatory Visit: Admitting: Podiatry

## 2024-07-18 ENCOUNTER — Encounter: Payer: Self-pay | Admitting: Podiatry

## 2024-07-18 DIAGNOSIS — B351 Tinea unguium: Secondary | ICD-10-CM | POA: Diagnosis not present

## 2024-07-18 DIAGNOSIS — M79674 Pain in right toe(s): Secondary | ICD-10-CM

## 2024-07-18 DIAGNOSIS — Q828 Other specified congenital malformations of skin: Secondary | ICD-10-CM

## 2024-07-18 DIAGNOSIS — E119 Type 2 diabetes mellitus without complications: Secondary | ICD-10-CM | POA: Diagnosis not present

## 2024-07-18 DIAGNOSIS — M79675 Pain in left toe(s): Secondary | ICD-10-CM

## 2024-07-18 DIAGNOSIS — I10 Essential (primary) hypertension: Secondary | ICD-10-CM | POA: Diagnosis not present

## 2024-07-20 ENCOUNTER — Ambulatory Visit: Admitting: Podiatry

## 2024-07-20 VITALS — Ht <= 58 in | Wt 158.0 lb

## 2024-07-20 DIAGNOSIS — E1142 Type 2 diabetes mellitus with diabetic polyneuropathy: Secondary | ICD-10-CM | POA: Diagnosis not present

## 2024-07-20 NOTE — Patient Instructions (Signed)
  VISIT SUMMARY: Today, we discussed the numbness you have been experiencing in your left foot, particularly at night and when walking. We reviewed your history of diabetes and noted that your blood sugar levels are well-controlled with an A1c of 6.0. You are currently taking gabapentin  300 mg once daily at night to manage your symptoms.  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 Your circulation is good, and your A1c is well-controlled at 6%. You should continue taking gabapentin  300 mg once daily at night. Perform daily foot checks for any cuts, scrapes, or sores, and monitor for any progression of the neuropathy. Contact your provider if you notice any foot injuries.  INSTRUCTIONS: Continue taking gabapentin  300 mg once daily at night. Perform daily foot checks for any cuts, scrapes, or sores. Monitor for any progression of the neuropathy and contact your provider if you notice any foot injuries.                      Contains text generated by Abridge.                                 Contains text generated by Abridge.

## 2024-07-21 ENCOUNTER — Encounter: Payer: Self-pay | Admitting: Podiatry

## 2024-07-21 NOTE — Progress Notes (Signed)
  Subjective:  Patient ID: SHERIN MURDOCH, female    DOB: 06-23-1946,  MRN: [id number]  OTILIA KAREEM presents to clinic today for painful porokeratotic lesion(s) left foot and painful mycotic toenails that limit ambulation. Painful toenails interfere with ambulation. Aggravating factors include wearing enclosed shoe gear. Pain is relieved with periodic professional debridement. Painful porokeratotic lesions are aggravated when weightbearing with and without shoegear. Pain is relieved with periodic professional debridement.  Chief Complaint  Patient presents with   [facility] [place] [facility]    Rm17 Diabetic foot care/ Dr. Lequita Flor last visit June 07 2024/ A1c 6/ blood sugar 103   New problem(s): Patient states she has been experiencing numbness between left 2nd and 3rd digits. She has h/o bilateral hip surgery, neck surgery, back surgery and shoulder surgery. Has h/o I & D of staph from shoulders.  PCP is Flor Lequita, MD.  Allergies  Allergen Reactions   Enalapril  Maleate Swelling   Ivp Dye [Iodinated Contrast Media] Nausea And Vomiting   Prednisone Other (See Comments)    reflux  Other Reaction(s): acid reflux    Review of Systems: Negative except as noted in the HPI.  Objective: No changes noted in today's physical examination. There were no vitals filed for this visit. ENRIKA AGUADO is a pleasant 78 y.o. female in NAD. AAO x 3.  Vascular Examination: Vascular status intact b/l with palpable pedal pulses. CFT immediate b/l. Pedal hair present. No edema. No pain with calf compression b/l. Skin temperature gradient WNL b/l. No varicosities noted. No cyanosis or clubbing noted.  Neurological Examination: Sensation grossly intact b/l with 10 gram monofilament. Vibratory sensation intact b/l.  Dermatological Examination: Pedal skin with normal turgor, texture and tone b/l.  No open wounds. No interdigital macerations.   Toenails 1-5 b/l thick, discolored, elongated with subungual  debris and pain on dorsal palpation.   Porokeratotic lesion(s) dorsal PIPJ L 5th toe. No erythema, no edema, no drainage, no fluctuance.  Musculoskeletal Examination: Muscle strength 5/5 to all lower extremity muscle groups bilaterally. 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  Radiographs: None  Assessment/Plan: 1. Pain due to onychomycosis of toenails of both feet   2. Porokeratosis   3. Controlled type 2 diabetes mellitus without complication, without long-term current use of insulin  [facility])   Patient was evaluated and treated. All patient's and/or POA's questions/concerns addressed on today's visit. Mycotic toenails 1-5 debrided in length and girth without incident. Porokeratotic lesion(s) dorsal PIPJ of left fifth digit pared with sharp debridement without incident. Continue daily foot inspections and monitor blood glucose per PCP/Endocrinologist's recommendations. Continue soft, supportive shoe gear daily. Report any pedal injuries to medical professional. Call office if there are any questions/concerns. -Patient referred to Dr. Juliene Medicine for evaluation of numbness of digits 2, 3 left foot. -Patient/POA to call should there be question/concern in the interim.   Return in about 3 months (around 10/18/2024).  [name], DPM      [place] LOCATION: [address], [place] [address]                   Office [phone]   [facility] LOCATION: [address] [place], [place] [address] Office [phone]

## 2024-07-23 NOTE — Progress Notes (Signed)
  Subjective:  Patient ID: [name], female    DOB: 05-Jul-1946,  MRN: [id number]  No chief complaint on file.   Discussed the use of AI scribe software for clinical note transcription with the patient, who gave verbal consent to proceed.  History of Present Illness [name] is a 78 year old female with diabetes who presents with numbness in her left foot.  She experiences numbness under her left foot, particularly noticeable at night and when walking. She describes a sensation of 'pulling' and compares it to the feeling of a sock being bunched up or feet getting cold. The numbness is not associated with pain.  She has a history of diabetes and is vigilant about monitoring her feet to prevent complications. Her blood sugar is well-controlled with an A1c of 6.0, and she monitors her levels regularly. Occasionally, her blood sugar reaches 170 mg/dL, which she attributes to taking gemtesa.  She is currently taking gabapentin  300 mg once daily at night for managing symptoms related to her condition.      Objective:    Physical Exam VASCULAR: DP and PT pulse palpable. Foot is warm and well-perfused. Capillary fill time is brisk. DERMATOLOGIC: Normal skin turgor, texture, and temperature. No open lesions, rashes, or ulcerations. NEUROLOGIC: Lack of protective sensation to midfoot plantar and MTP level on dorsal foot. No paresthesias on examination. ORTHOPEDIC: Smooth pain-free range of motion of all examined joints. No ecchymosis or bruising. No gross deformity. No pain to palpation.   No images are attached to the encounter.    Results LABS Hemoglobin A1c: 6%   Assessment:   1. Diabetic polyneuropathy associated with type 2 diabetes mellitus (HCC)      Plan:  Patient was evaluated and treated and all questions answered.  Assessment and Plan Assessment & Plan Type 2 diabetes mellitus with diabetic polyneuropathy of the left foot Diabetic polyneuropathy of the left  foot presents with numbness and sensation changes, likely due to long-standing diabetes and aging. Circulation is good with strong pulses, and A1c is well-controlled at 6%. No pain reported, and symptoms do not indicate a circulation issue. Gabapentin  300 mg once daily at night is not expected to alleviate numbness. No immediate concerns for long-term risk due to good circulation and controlled A1c. Neuropathy can progress further up the foot, so monitoring is essential. - Continue gabapentin  300 mg once daily at night - Perform daily foot checks for cuts, scrapes, or sores - Monitor for progression of neuropathy - Contact provider if any foot injuries occur      No follow-ups on file.

## 2024-07-25 DIAGNOSIS — Z79899 Other long term (current) drug therapy: Secondary | ICD-10-CM | POA: Diagnosis not present

## 2024-07-25 DIAGNOSIS — M1990 Unspecified osteoarthritis, unspecified site: Secondary | ICD-10-CM | POA: Diagnosis not present

## 2024-07-25 DIAGNOSIS — M0609 Rheumatoid arthritis without rheumatoid factor, multiple sites: Secondary | ICD-10-CM | POA: Diagnosis not present

## 2024-07-27 DIAGNOSIS — F411 Generalized anxiety disorder: Secondary | ICD-10-CM | POA: Diagnosis not present

## 2024-07-31 DIAGNOSIS — I10 Essential (primary) hypertension: Secondary | ICD-10-CM | POA: Diagnosis not present

## 2024-08-02 DIAGNOSIS — Z23 Encounter for immunization: Secondary | ICD-10-CM | POA: Diagnosis not present

## 2024-08-10 DIAGNOSIS — F411 Generalized anxiety disorder: Secondary | ICD-10-CM | POA: Diagnosis not present

## 2024-08-21 DIAGNOSIS — M0609 Rheumatoid arthritis without rheumatoid factor, multiple sites: Secondary | ICD-10-CM | POA: Diagnosis not present

## 2024-08-31 DIAGNOSIS — F411 Generalized anxiety disorder: Secondary | ICD-10-CM | POA: Diagnosis not present

## 2024-09-20 DIAGNOSIS — Z96643 Presence of artificial hip joint, bilateral: Secondary | ICD-10-CM | POA: Diagnosis not present

## 2024-09-20 DIAGNOSIS — G8929 Other chronic pain: Secondary | ICD-10-CM | POA: Diagnosis not present

## 2024-09-20 DIAGNOSIS — M25551 Pain in right hip: Secondary | ICD-10-CM | POA: Diagnosis not present

## 2024-09-20 DIAGNOSIS — M1711 Unilateral primary osteoarthritis, right knee: Secondary | ICD-10-CM | POA: Diagnosis not present

## 2024-09-21 DIAGNOSIS — F411 Generalized anxiety disorder: Secondary | ICD-10-CM | POA: Diagnosis not present

## 2024-10-04 DIAGNOSIS — M1711 Unilateral primary osteoarthritis, right knee: Secondary | ICD-10-CM | POA: Diagnosis not present

## 2024-10-04 DIAGNOSIS — G8929 Other chronic pain: Secondary | ICD-10-CM | POA: Diagnosis not present

## 2024-10-05 DIAGNOSIS — F411 Generalized anxiety disorder: Secondary | ICD-10-CM | POA: Diagnosis not present

## 2024-10-17 DIAGNOSIS — M1711 Unilateral primary osteoarthritis, right knee: Secondary | ICD-10-CM | POA: Diagnosis not present

## 2024-10-18 ENCOUNTER — Ambulatory Visit: Admitting: Podiatry

## 2024-10-24 DIAGNOSIS — M1990 Unspecified osteoarthritis, unspecified site: Secondary | ICD-10-CM | POA: Diagnosis not present

## 2024-10-24 DIAGNOSIS — Z79899 Other long term (current) drug therapy: Secondary | ICD-10-CM | POA: Diagnosis not present

## 2024-10-24 DIAGNOSIS — M0609 Rheumatoid arthritis without rheumatoid factor, multiple sites: Secondary | ICD-10-CM | POA: Diagnosis not present

## 2024-10-24 DIAGNOSIS — M1711 Unilateral primary osteoarthritis, right knee: Secondary | ICD-10-CM | POA: Diagnosis not present

## 2024-10-26 DIAGNOSIS — F411 Generalized anxiety disorder: Secondary | ICD-10-CM | POA: Diagnosis not present

## 2024-11-01 DIAGNOSIS — M0609 Rheumatoid arthritis without rheumatoid factor, multiple sites: Secondary | ICD-10-CM | POA: Diagnosis not present

## 2024-11-01 DIAGNOSIS — J432 Centrilobular emphysema: Secondary | ICD-10-CM | POA: Diagnosis not present

## 2024-11-01 DIAGNOSIS — I10 Essential (primary) hypertension: Secondary | ICD-10-CM | POA: Diagnosis not present

## 2024-11-01 DIAGNOSIS — I700 Atherosclerosis of aorta: Secondary | ICD-10-CM | POA: Diagnosis not present

## 2024-11-01 DIAGNOSIS — E782 Mixed hyperlipidemia: Secondary | ICD-10-CM | POA: Diagnosis not present

## 2024-11-01 DIAGNOSIS — N182 Chronic kidney disease, stage 2 (mild): Secondary | ICD-10-CM | POA: Diagnosis not present

## 2024-11-01 DIAGNOSIS — Z79899 Other long term (current) drug therapy: Secondary | ICD-10-CM | POA: Diagnosis not present

## 2024-11-01 DIAGNOSIS — E1142 Type 2 diabetes mellitus with diabetic polyneuropathy: Secondary | ICD-10-CM | POA: Diagnosis not present

## 2024-11-07 ENCOUNTER — Ambulatory Visit (INDEPENDENT_AMBULATORY_CARE_PROVIDER_SITE_OTHER): Admitting: Podiatry

## 2024-11-07 ENCOUNTER — Encounter: Payer: Self-pay | Admitting: Podiatry

## 2024-11-07 DIAGNOSIS — Q828 Other specified congenital malformations of skin: Secondary | ICD-10-CM | POA: Diagnosis not present

## 2024-11-07 DIAGNOSIS — M79675 Pain in left toe(s): Secondary | ICD-10-CM | POA: Diagnosis not present

## 2024-11-07 DIAGNOSIS — E1142 Type 2 diabetes mellitus with diabetic polyneuropathy: Secondary | ICD-10-CM | POA: Diagnosis not present

## 2024-11-07 DIAGNOSIS — B351 Tinea unguium: Secondary | ICD-10-CM

## 2024-11-07 DIAGNOSIS — M79674 Pain in right toe(s): Secondary | ICD-10-CM | POA: Diagnosis not present

## 2024-11-07 NOTE — Progress Notes (Unsigned)
°  Subjective:  Patient ID: [name], female    DOB: 13-Oct-1946,  MRN: [id number]  [name] presents to clinic today for {jgcomplaint:23593}  Chief Complaint  Patient presents with   Diabetes    [facility] NIDDM A1C 5.9 LOV with PCP 08/02/24.   New problem(s): None.   PCP is Verdia Lombard, MD.  Allergies[1]  Review of Systems: Negative except as noted in the HPI.  Objective: No changes noted in today's physical examination. There were no vitals filed for this visit. [name] is a pleasant 78 y.o. female WD, WN in NAD. AAO x 3.  Vascular Examination: Vascular status intact b/l with palpable pedal pulses. CFT immediate b/l. Pedal hair present. No edema. No pain with calf compression b/l. Skin temperature gradient WNL b/l. No varicosities noted. No cyanosis or clubbing noted.  Neurological Examination: Sensation grossly intact b/l with 10 gram monofilament. Vibratory sensation intact b/l.  Dermatological Examination: Pedal skin with normal turgor, texture and tone b/l.  No open wounds. No interdigital macerations.   Toenails 1-5 b/l thick, discolored, elongated with subungual debris and pain on dorsal palpation.   Porokeratotic lesion(s) dorsal PIPJ L 5th toe. No erythema, no edema, no drainage, no fluctuance.  Musculoskeletal Examination: Muscle strength 5/5 to all lower extremity muscle groups bilaterally. 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  Radiographs: None  Assessment/Plan: 1. Pain due to onychomycosis of toenails of both feet   2. Porokeratosis   3. Diabetic polyneuropathy associated with type 2 diabetes mellitus (HCC)     No orders of the defined types were placed in this encounter.   None {Jgplan:23602::-Patient/POA to call should there be question/concern in the interim.}   No follow-ups on file.  [name], DPM      [place] LOCATION: [address] [place] [address]                    Office [phone]   [place] LOCATION: [address] [place], [place] [address] Office [phone]      [1]  Allergies Allergen Reactions   Enalapril  Maleate Swelling   Ivp Dye [Iodinated Contrast Media] Nausea And Vomiting   Prednisone Other (See Comments)    reflux  Other Reaction(s): acid reflux

## 2024-11-27 ENCOUNTER — Ambulatory Visit: Admitting: Infectious Disease

## 2024-12-26 ENCOUNTER — Ambulatory Visit: Admitting: Podiatry

## 2025-01-16 ENCOUNTER — Ambulatory Visit: Admitting: Podiatry

## 2025-02-20 ENCOUNTER — Ambulatory Visit: Admitting: Podiatry
